# Patient Record
Sex: Male | Born: 1950
Health system: Southern US, Community
[De-identification: ages and names within clinical notes are randomized; demographics above are authoritative.]

## PROBLEM LIST (undated history)

## (undated) DIAGNOSIS — E785 Hyperlipidemia, unspecified: Secondary | ICD-10-CM

## (undated) DIAGNOSIS — I10 Essential (primary) hypertension: Secondary | ICD-10-CM

## (undated) HISTORY — DX: Hyperlipidemia, unspecified: E78.5

## (undated) HISTORY — PX: EYE SURGERY: SHX253

---

## 2011-05-30 DIAGNOSIS — H2702 Aphakia, left eye: Secondary | ICD-10-CM | POA: Insufficient documentation

## 2011-05-30 DIAGNOSIS — S058X9A Other injuries of unspecified eye and orbit, initial encounter: Secondary | ICD-10-CM | POA: Insufficient documentation

## 2011-07-03 ENCOUNTER — Emergency Department (HOSPITAL_COMMUNITY): Payer: Self-pay

## 2011-07-03 ENCOUNTER — Encounter: Payer: Self-pay | Admitting: Emergency Medicine

## 2011-07-03 ENCOUNTER — Emergency Department (HOSPITAL_COMMUNITY)
Admission: EM | Admit: 2011-07-03 | Discharge: 2011-07-04 | Disposition: A | Discharge status: Home / Self Care | Payer: Self-pay | Attending: Emergency Medicine | Admitting: Emergency Medicine

## 2011-07-03 DIAGNOSIS — IMO0002 Reserved for concepts with insufficient information to code with codable children: Secondary | ICD-10-CM | POA: Insufficient documentation

## 2011-07-03 DIAGNOSIS — M545 Low back pain, unspecified: Secondary | ICD-10-CM | POA: Insufficient documentation

## 2011-07-03 DIAGNOSIS — W19XXXA Unspecified fall, initial encounter: Secondary | ICD-10-CM

## 2011-07-03 DIAGNOSIS — M25559 Pain in unspecified hip: Secondary | ICD-10-CM | POA: Insufficient documentation

## 2011-07-03 DIAGNOSIS — S0100XA Unspecified open wound of scalp, initial encounter: Secondary | ICD-10-CM | POA: Insufficient documentation

## 2011-07-03 DIAGNOSIS — S22080A Wedge compression fracture of T11-T12 vertebra, initial encounter for closed fracture: Secondary | ICD-10-CM

## 2011-07-03 DIAGNOSIS — W11XXXA Fall on and from ladder, initial encounter: Secondary | ICD-10-CM | POA: Insufficient documentation

## 2011-07-03 DIAGNOSIS — S0101XA Laceration without foreign body of scalp, initial encounter: Secondary | ICD-10-CM

## 2011-07-03 IMAGING — CR DG LUMBAR SPINE COMPLETE 4+V
5 series · 5 of 5 positions shown · non-contrast
Comparison: Chest x-ray of the same day.

CLINICAL DATA: Fall.  Low back pain.  Bilateral hip pain.

LUMBAR SPINE - COMPLETE 4+ VIEW

[t l-spine a.p.]
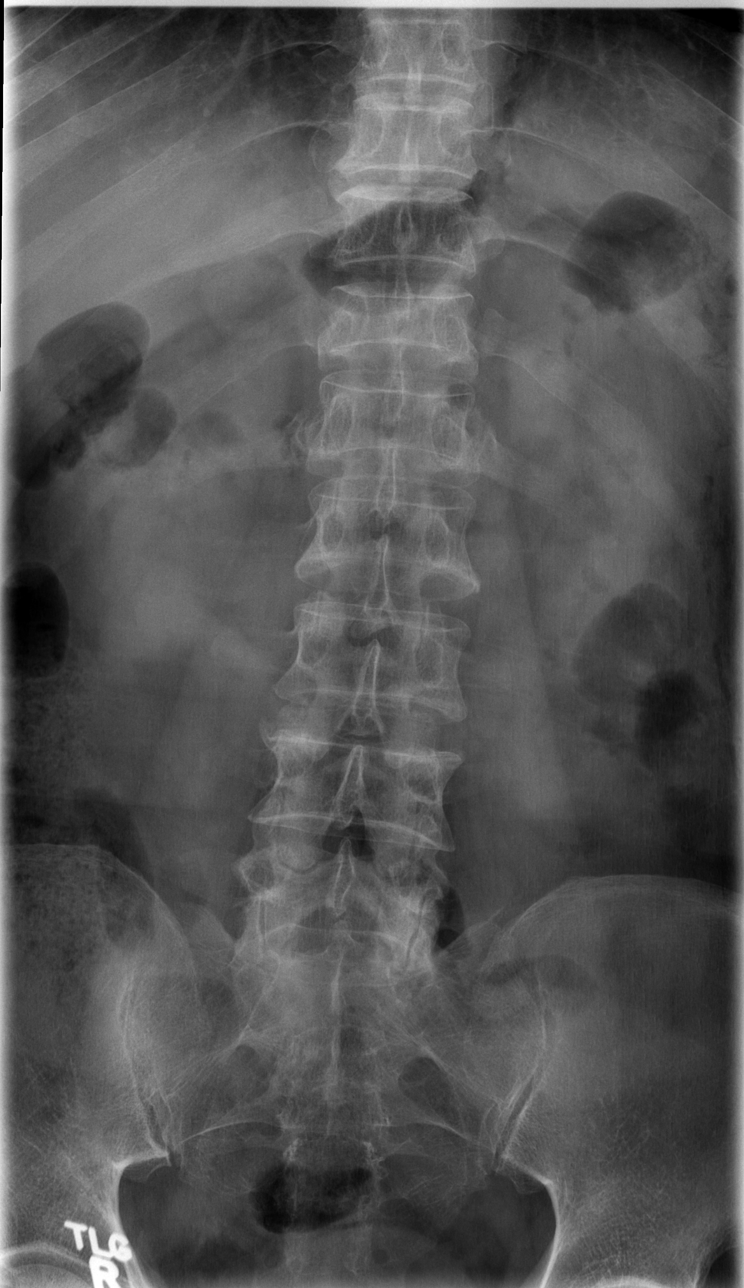

[t l-spine oblique exposure (1 of 2)]
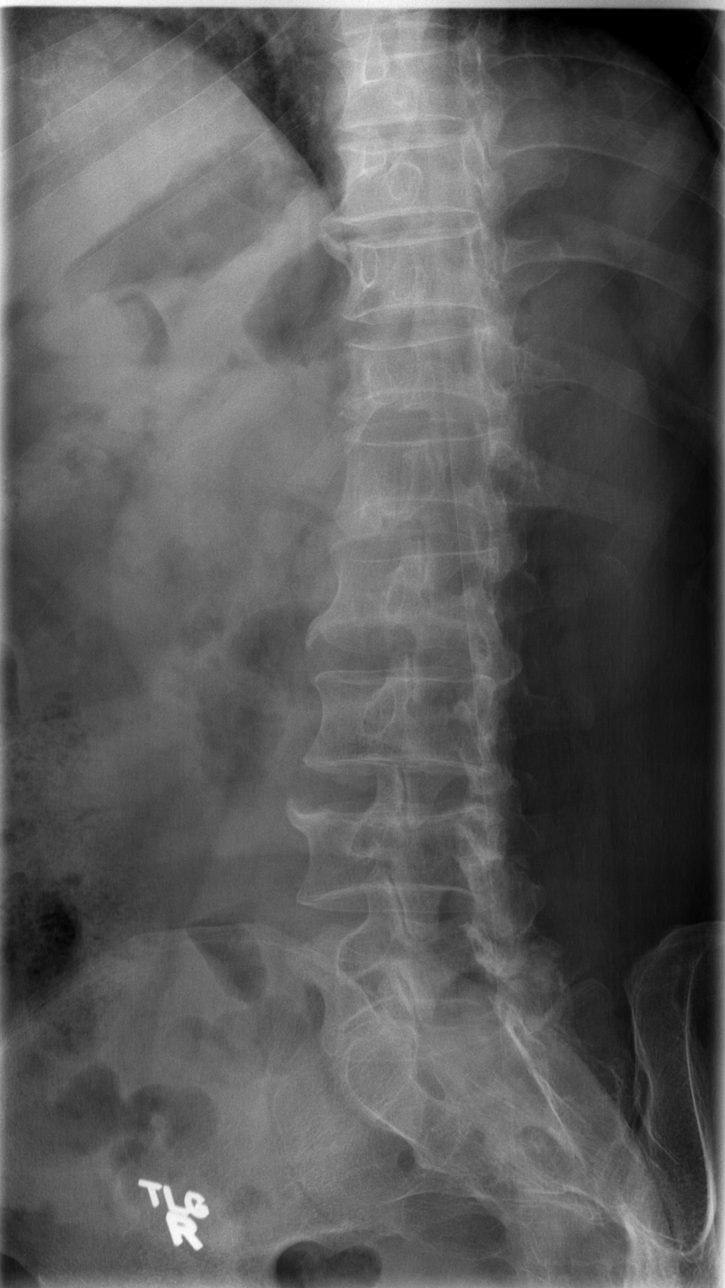

[t l-spine oblique exposure (2 of 2)]
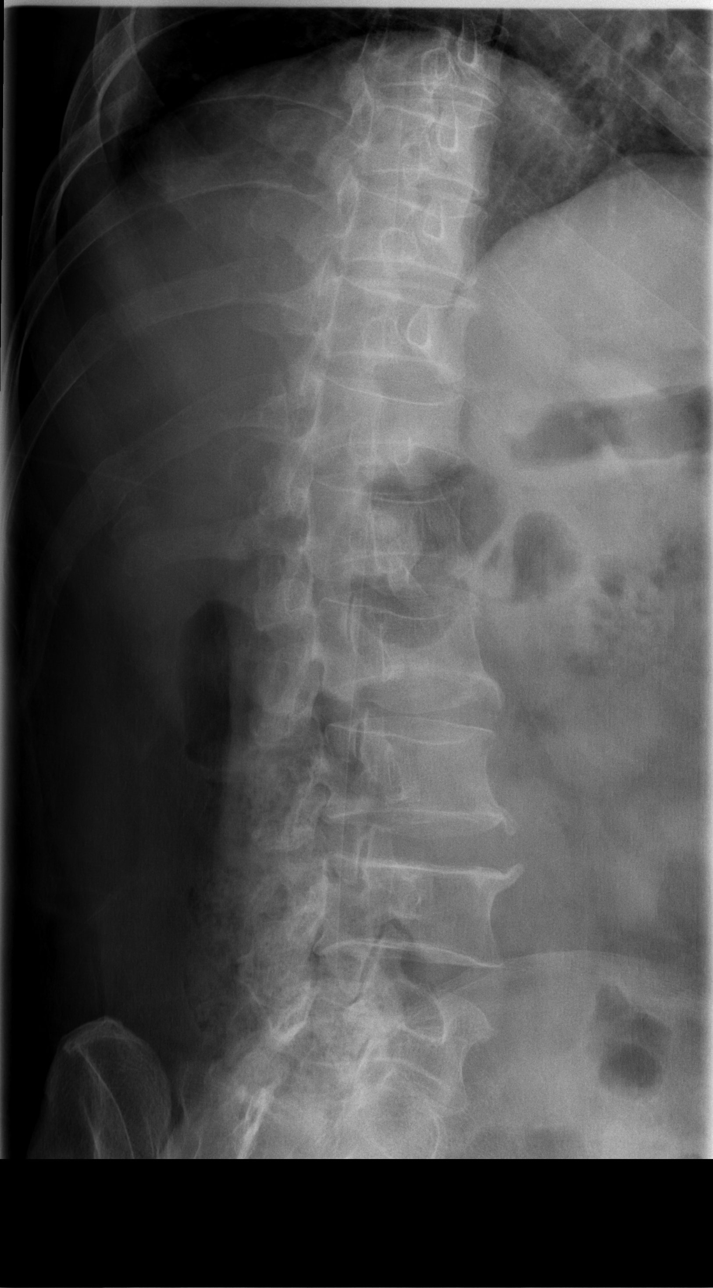

[t l-spine lat]
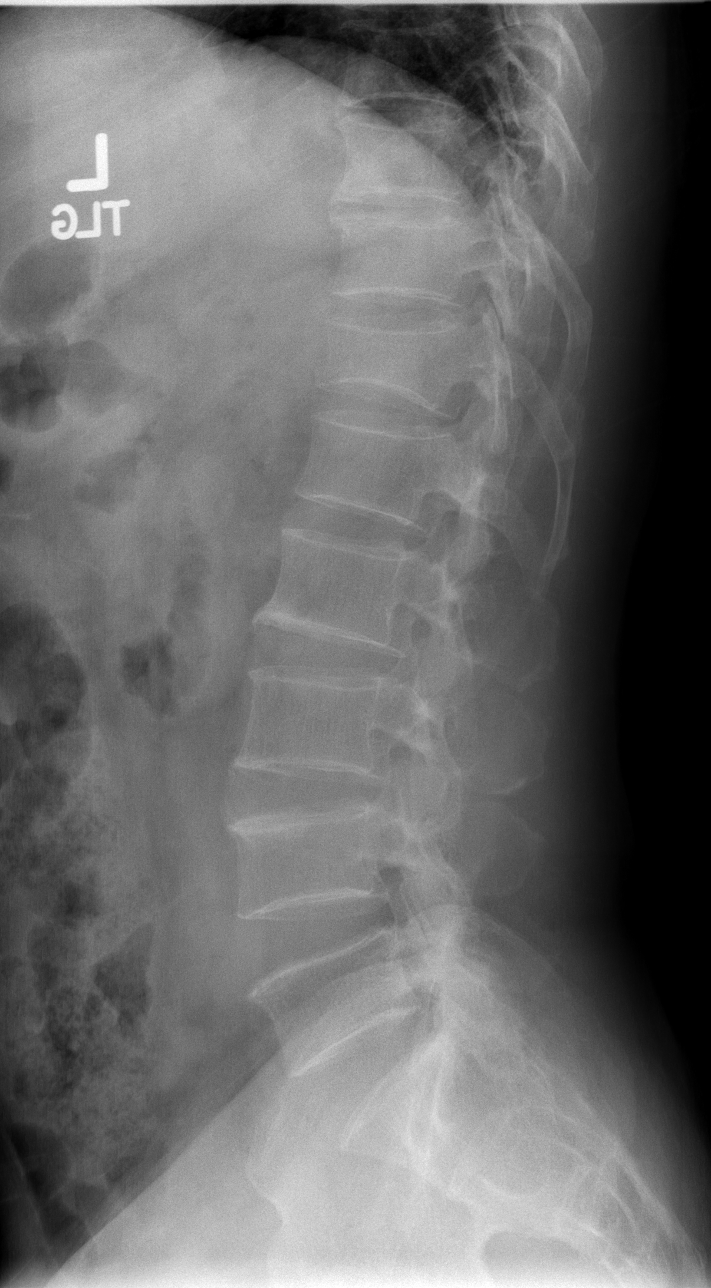

[t l-spine l5-s1 spot]
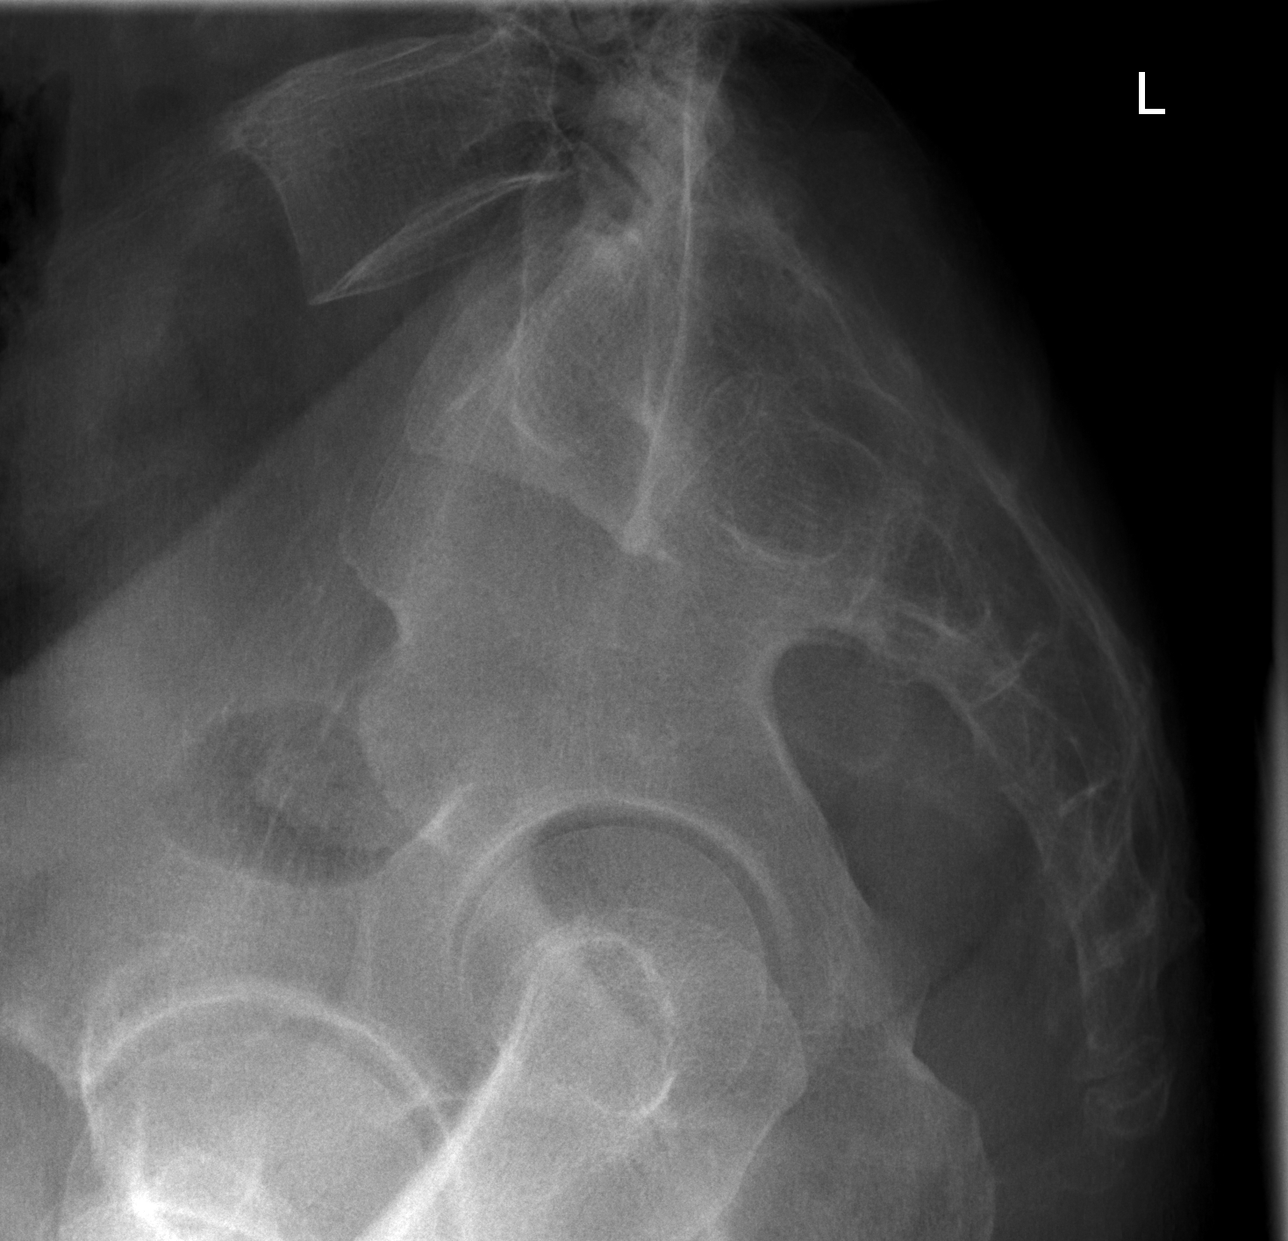

[5 of 5 positions shown; findings below may reference images not displayed]

FINDINGS: A transitional L1 segment is present.

A compression fracture of T12 appears chronic.  An acute fracture
is not excluded.  If the the patient is tender in this area, CT may
be warranted.  The vertebral body heights are otherwise maintained.
Alignment is anatomic.  Mild endplate degenerative changes are
present.  There is mild facet disease, worse on the left at L5-S1.
IMPRESSION: 1.  Transitional L1 segment.
2.  Anterior compression fracture of T12 is likely remote.  An
acute fracture cannot be excluded.  If the patient is tender in
this area, CT may be useful for further evaluation.

## 2011-07-03 IMAGING — CT CT CERVICAL SPINE W/O CM
4 of 5 series · 16 of 33 positions shown, 18 images · non-contrast
Comparison: None

CT HEAD

CLINICAL DATA: Status post fall.  Concern for head or cervical
spine injury.

CT HEAD WITHOUT CONTRAST AND CT CERVICAL SPINE WITHOUT CONTRAST
TECHNIQUE: Multidetector CT imaging of the head and cervical spine
was performed following the standard protocol without intravenous
contrast.  Multiplanar CT image reconstructions of the cervical
spine were also generated.

[Series 6: c_spine 2.0 b31s detail · axial · 0.29mm/px · z∈[+1022,+1118]mm · 4 of 82 slices shown, 5 images]
[im 17/82  soft-tissue]
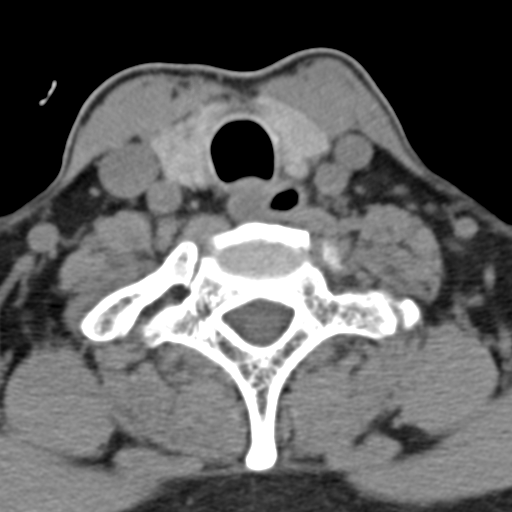
[im 17/82  bone]
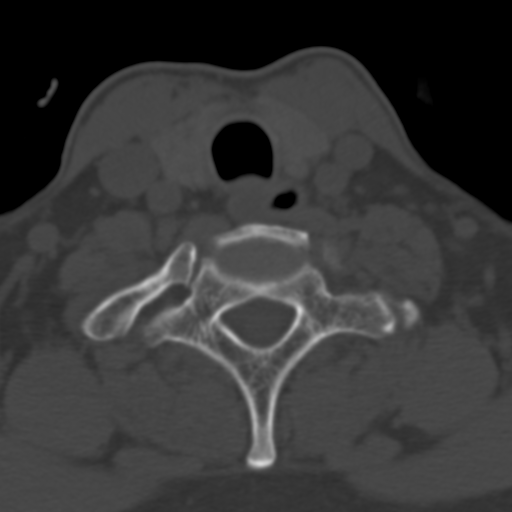
[im 33/82  bone]
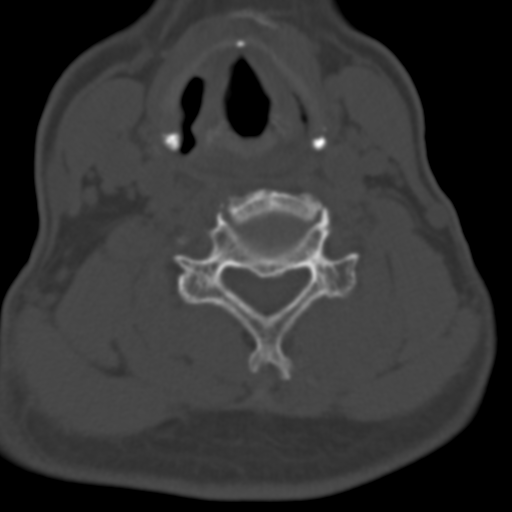
[im 49/82  bone]
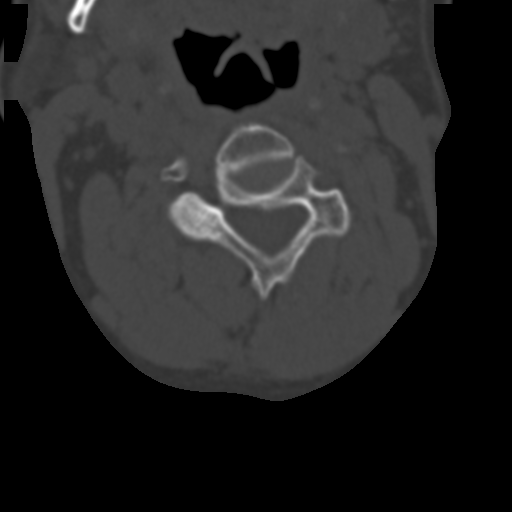
[im 65/82  bone]
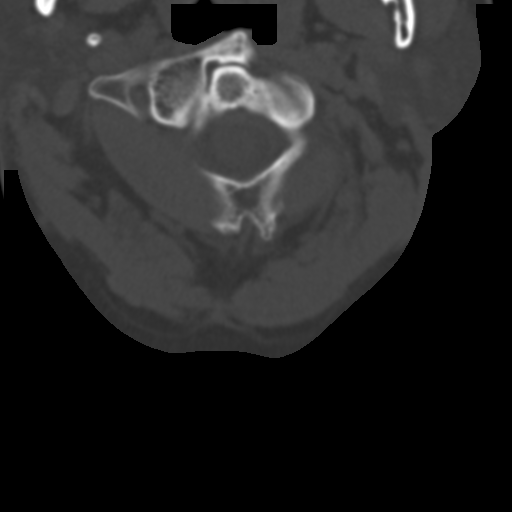

[Series 602: sagittal · sagittal · 0.32mm/px · 5 of 47 slices shown, 6 images]
[im 16/47  bone]
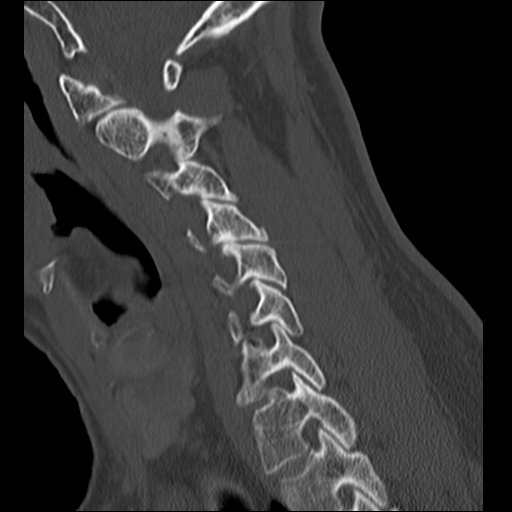
[im 20/47  bone]
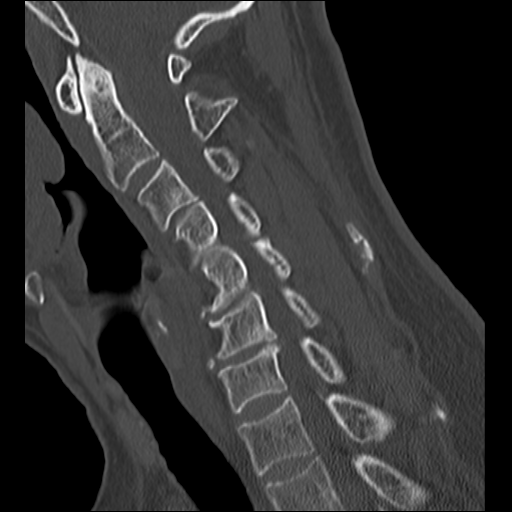
[im 24/47  soft-tissue]
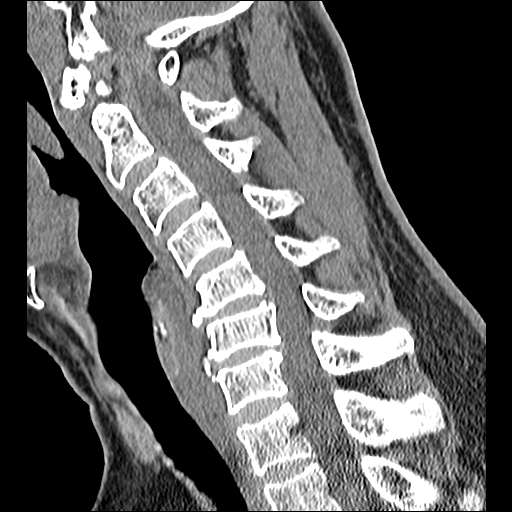
[im 24/47  bone]
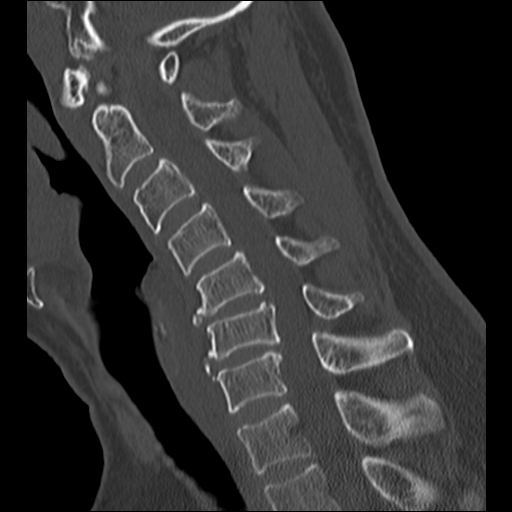
[im 27/47  bone]
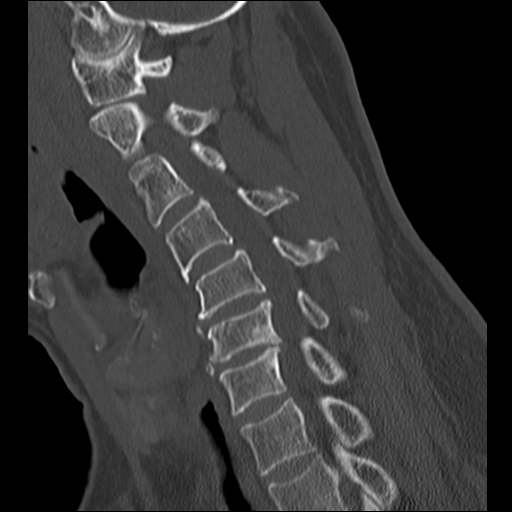
[im 31/47  bone]
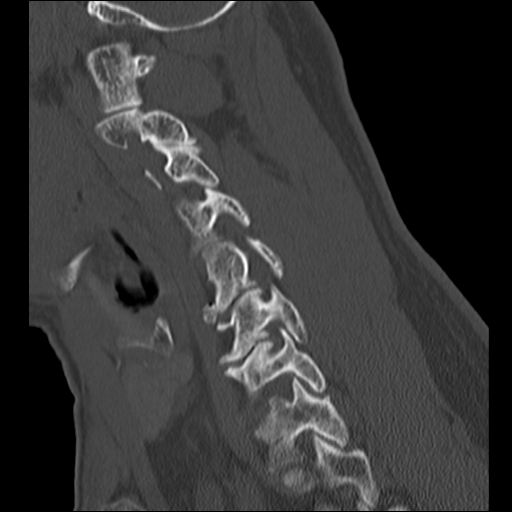

[Series 603: coronals · coronal · 0.32mm/px · 3 of 48 slices shown]
[im 10/48  bone]
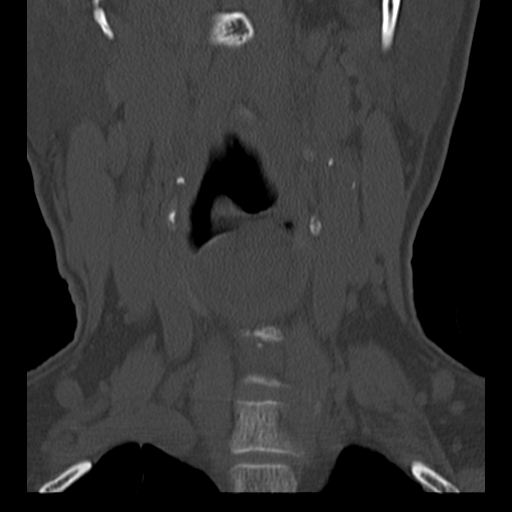
[im 19/48  bone]
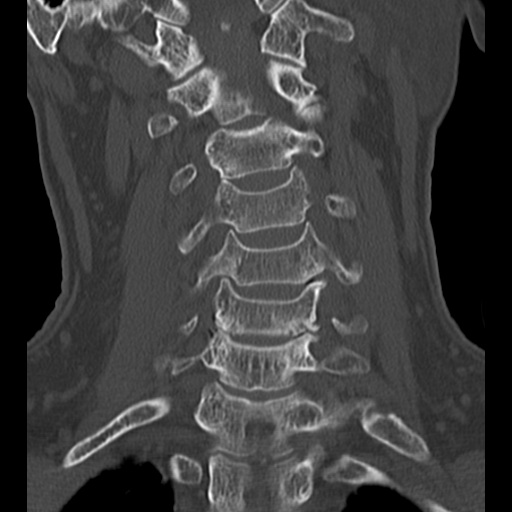
[im 29/48  bone]
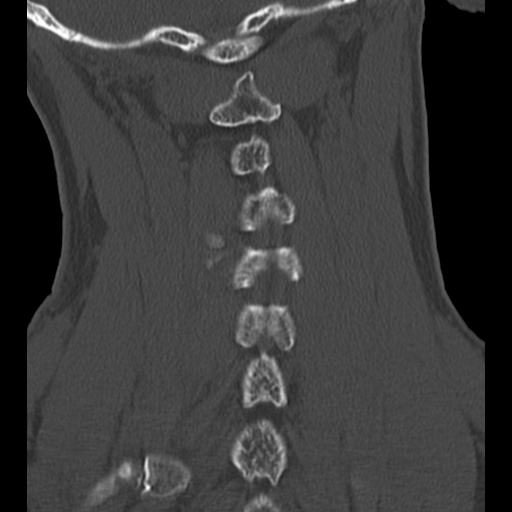

[Series 604: orthgonals · axial · 0.32mm/px · z∈[+992,+1073]mm · 4 of 80 slices shown]
[im 16/80  bone]
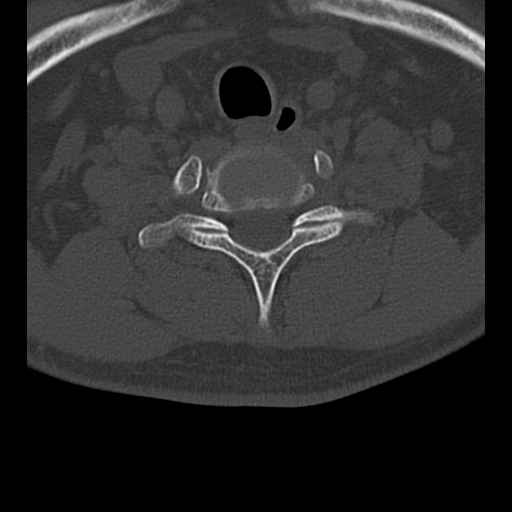
[im 32/80  bone]
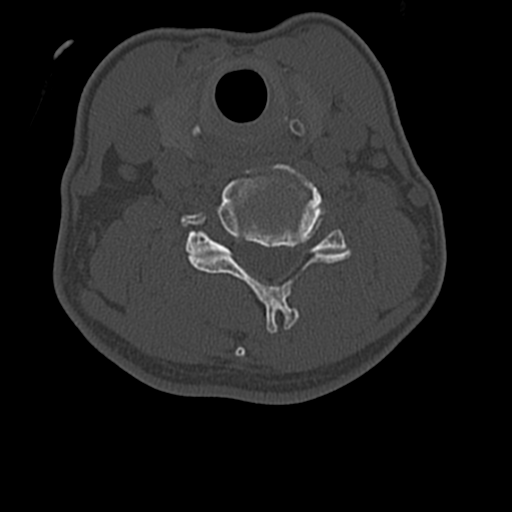
[im 48/80  bone]
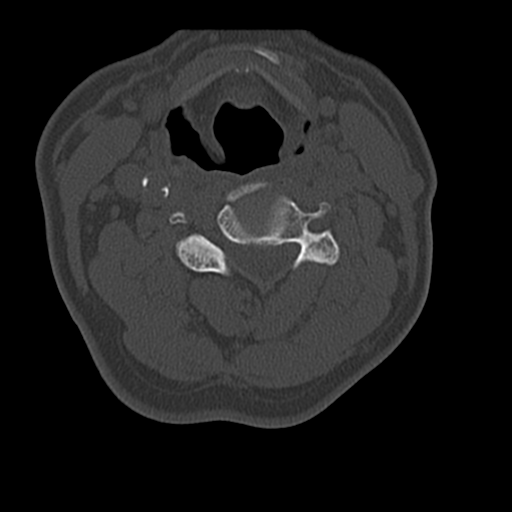
[im 64/80  bone]
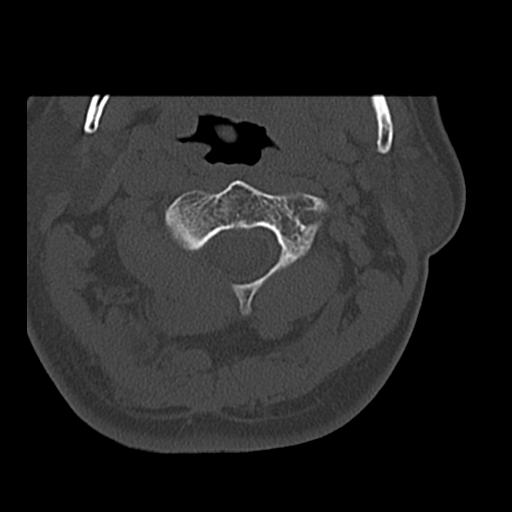

[16 of 33 positions shown; findings below may reference images not displayed]

FINDINGS: There is no evidence of acute infarction, mass lesion, or
intra- or extra-axial hemorrhage on CT.

The posterior fossa, including the cerebellum, brainstem and fourth
ventricle, is within normal limits.  The third and lateral
ventricles, and basal ganglia are unremarkable in appearance.  The
cerebral hemispheres are symmetric in appearance, with normal gray-
white differentiation.  No mass effect or midline shift is seen.

There is no evidence of fracture; visualized osseous structures are
unremarkable in appearance.  The visualized portions of the orbits
are within normal limits. Mild mucosal thickening is noted within
the maxillary sinuses; the remaining paranasal sinuses and mastoid
air cells are well-aerated.  Soft tissue laceration, with soft
tissue swelling and mild soft tissue air, is seen overlying the
left frontoparietal calvarium.
IMPRESSION: 1.  No evidence of traumatic intracranial injury or fracture.
2.  Soft tissue laceration, with soft tissue swelling and mild soft
tissue air, overlying the left frontoparietal calvarium.
3.  Mild mucosal thickening within the maxillary sinuses.

CT CERVICAL SPINE
FINDINGS: There is no evidence of fracture or subluxation.
Reversal of the normal lordotic curvature of the cervical spine
appears chronic in nature, with chronic mild loss of height along
the vertebral bodies at the lower cervical spine.  Associated
anterior and posterior disc osteophyte complexes are seen.
Intervertebral disc spaces are preserved.  Prevertebral soft
tissues are within normal limits.

The thyroid gland is unremarkable in appearance.  The visualized
lung apices are clear.  No significant soft tissue abnormalities
are seen.
IMPRESSION: 1.  No evidence of fracture or subluxation along the cervical
spine.
2.  Mild chronic appearing cervical kyphosis, with chronic mild
loss of vertebral body height along the lower cervical spine.

## 2011-07-03 IMAGING — CR DG HIP (WITH OR WITHOUT PELVIS) 2-3V*L*
3 series · 3 of 3 positions shown · non-contrast
Comparison: None available.

CLINICAL DATA: Fall.  Bilateral hip pain.

LEFT HIP - COMPLETE 2+ VIEW

[t pelvis a.p.]
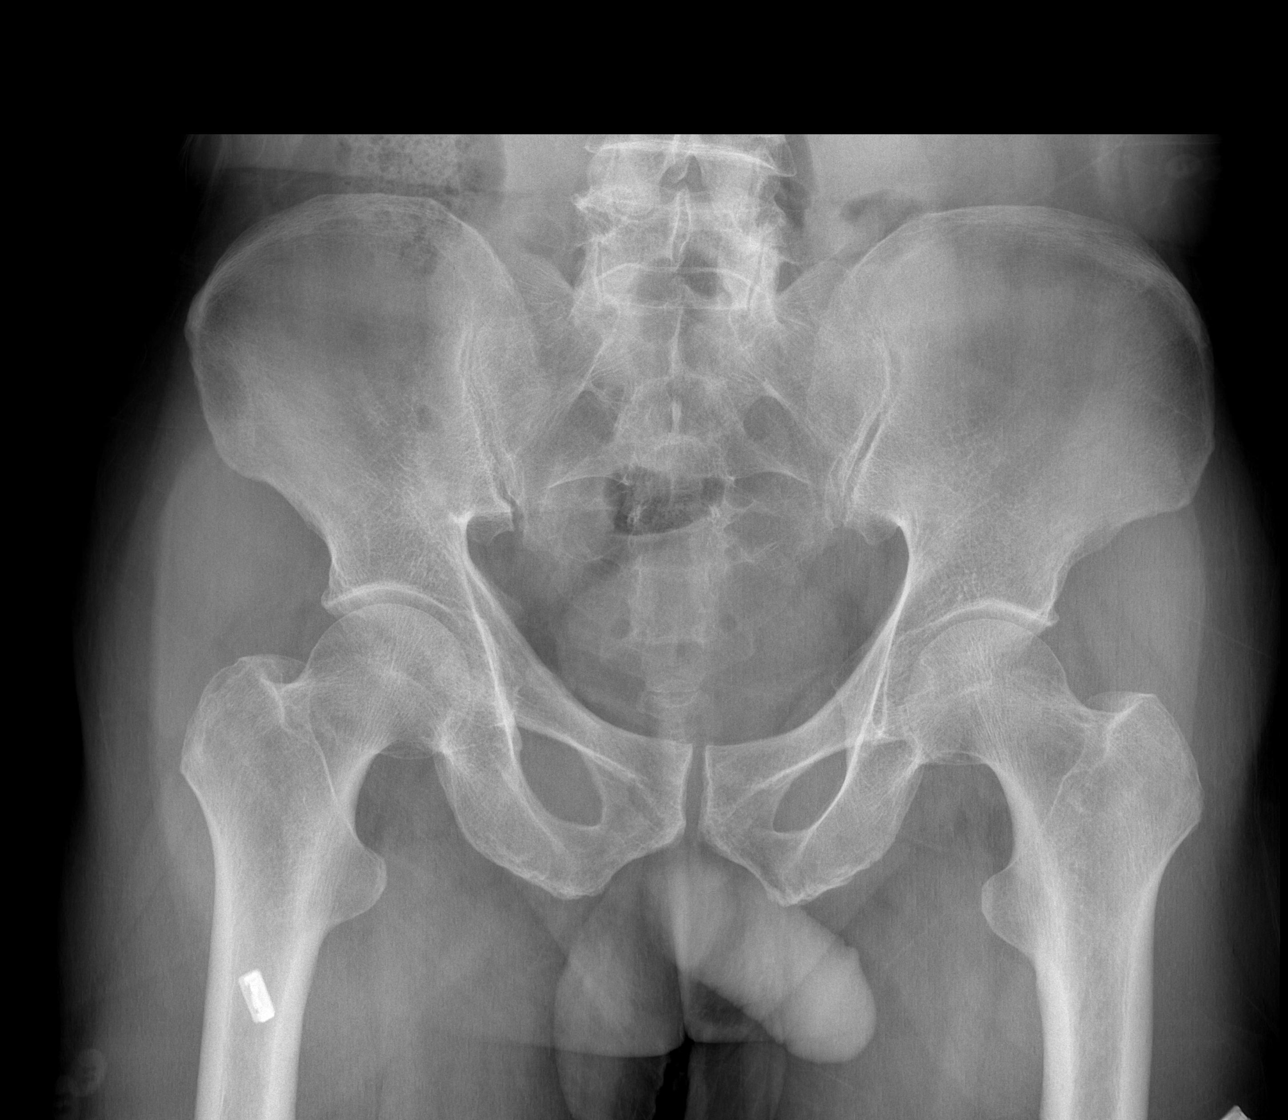

[t hip ap left]
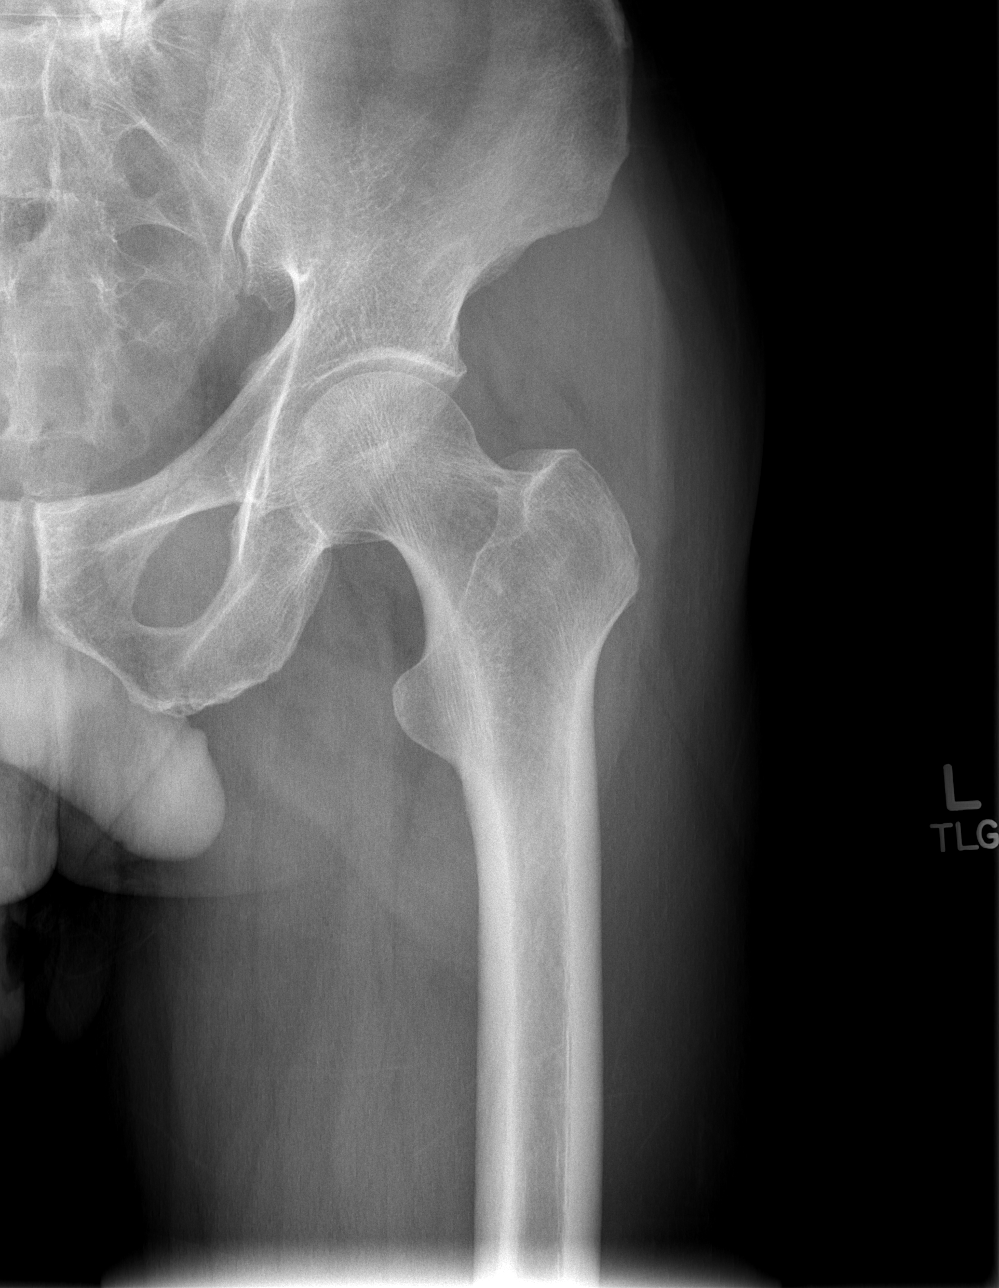

[t hip frog leg left]
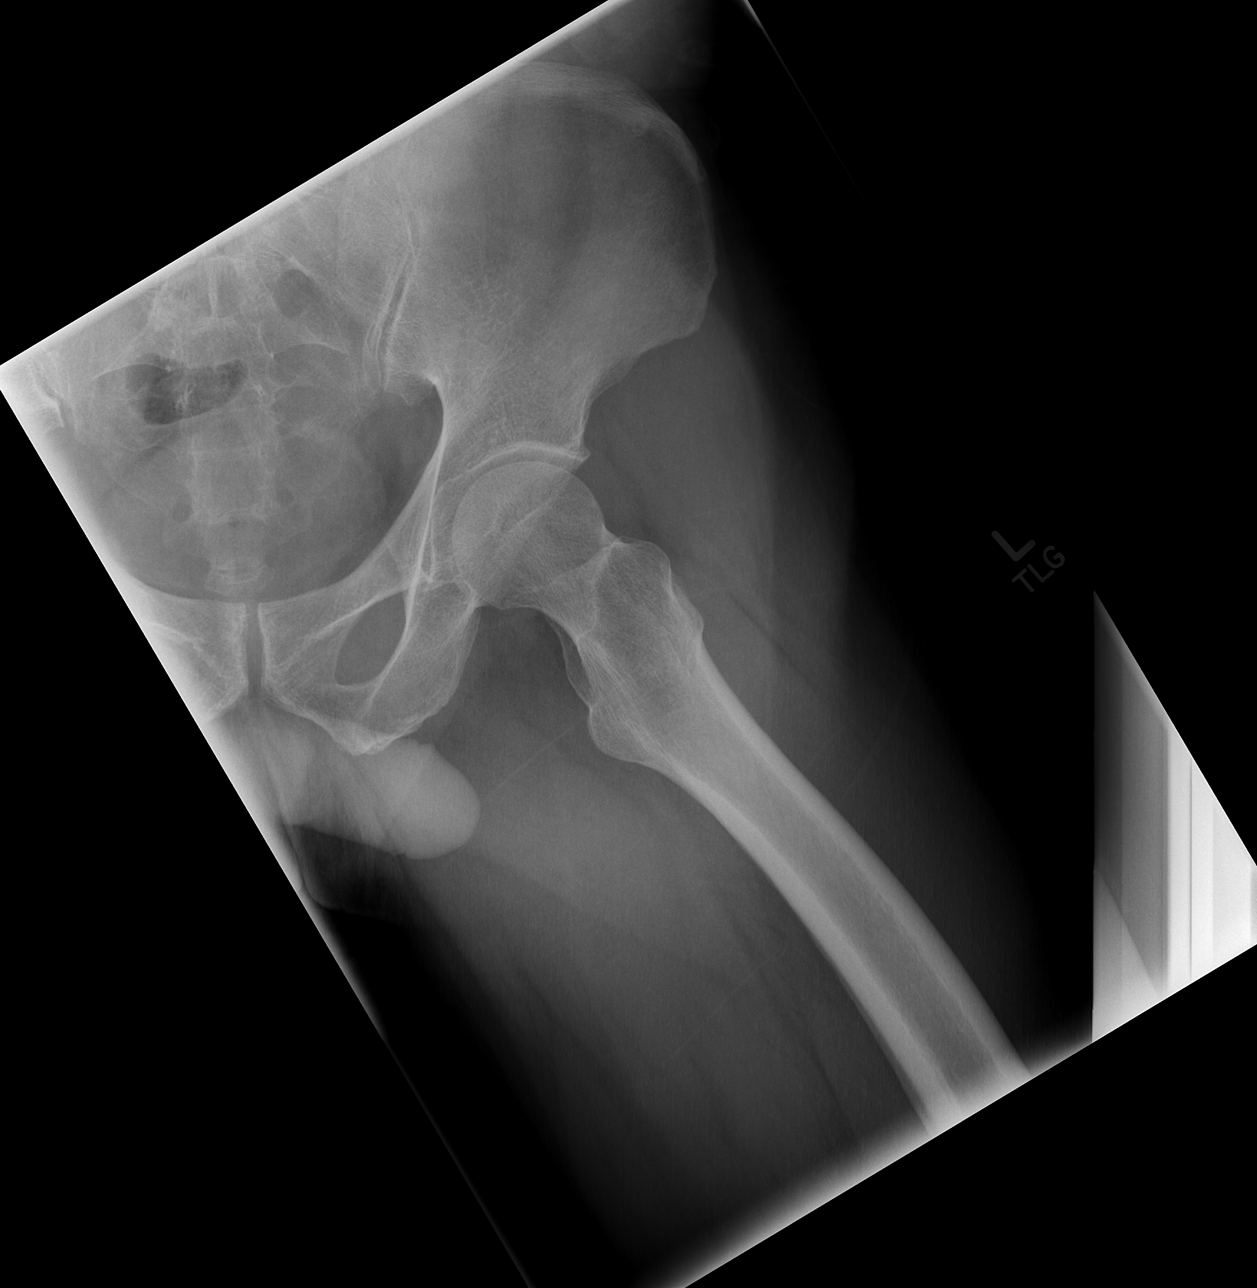

[3 of 3 positions shown; findings below may reference images not displayed]

FINDINGS: The left hip is located.  No acute bone or soft tissue
abnormality is present.  The pelvis is intact.  Mild degenerative
changes are evident at L5-S1.
IMPRESSION: 1.  Negative left hip.
2.  Minimal degenerative changes at L5-S1.

## 2011-07-03 IMAGING — CR DG CHEST 2V
2 series · 2 of 2 positions shown · non-contrast
Comparison: None.

CLINICAL DATA: Fever and cough.

CHEST - 2 VIEW

[t chest supine]
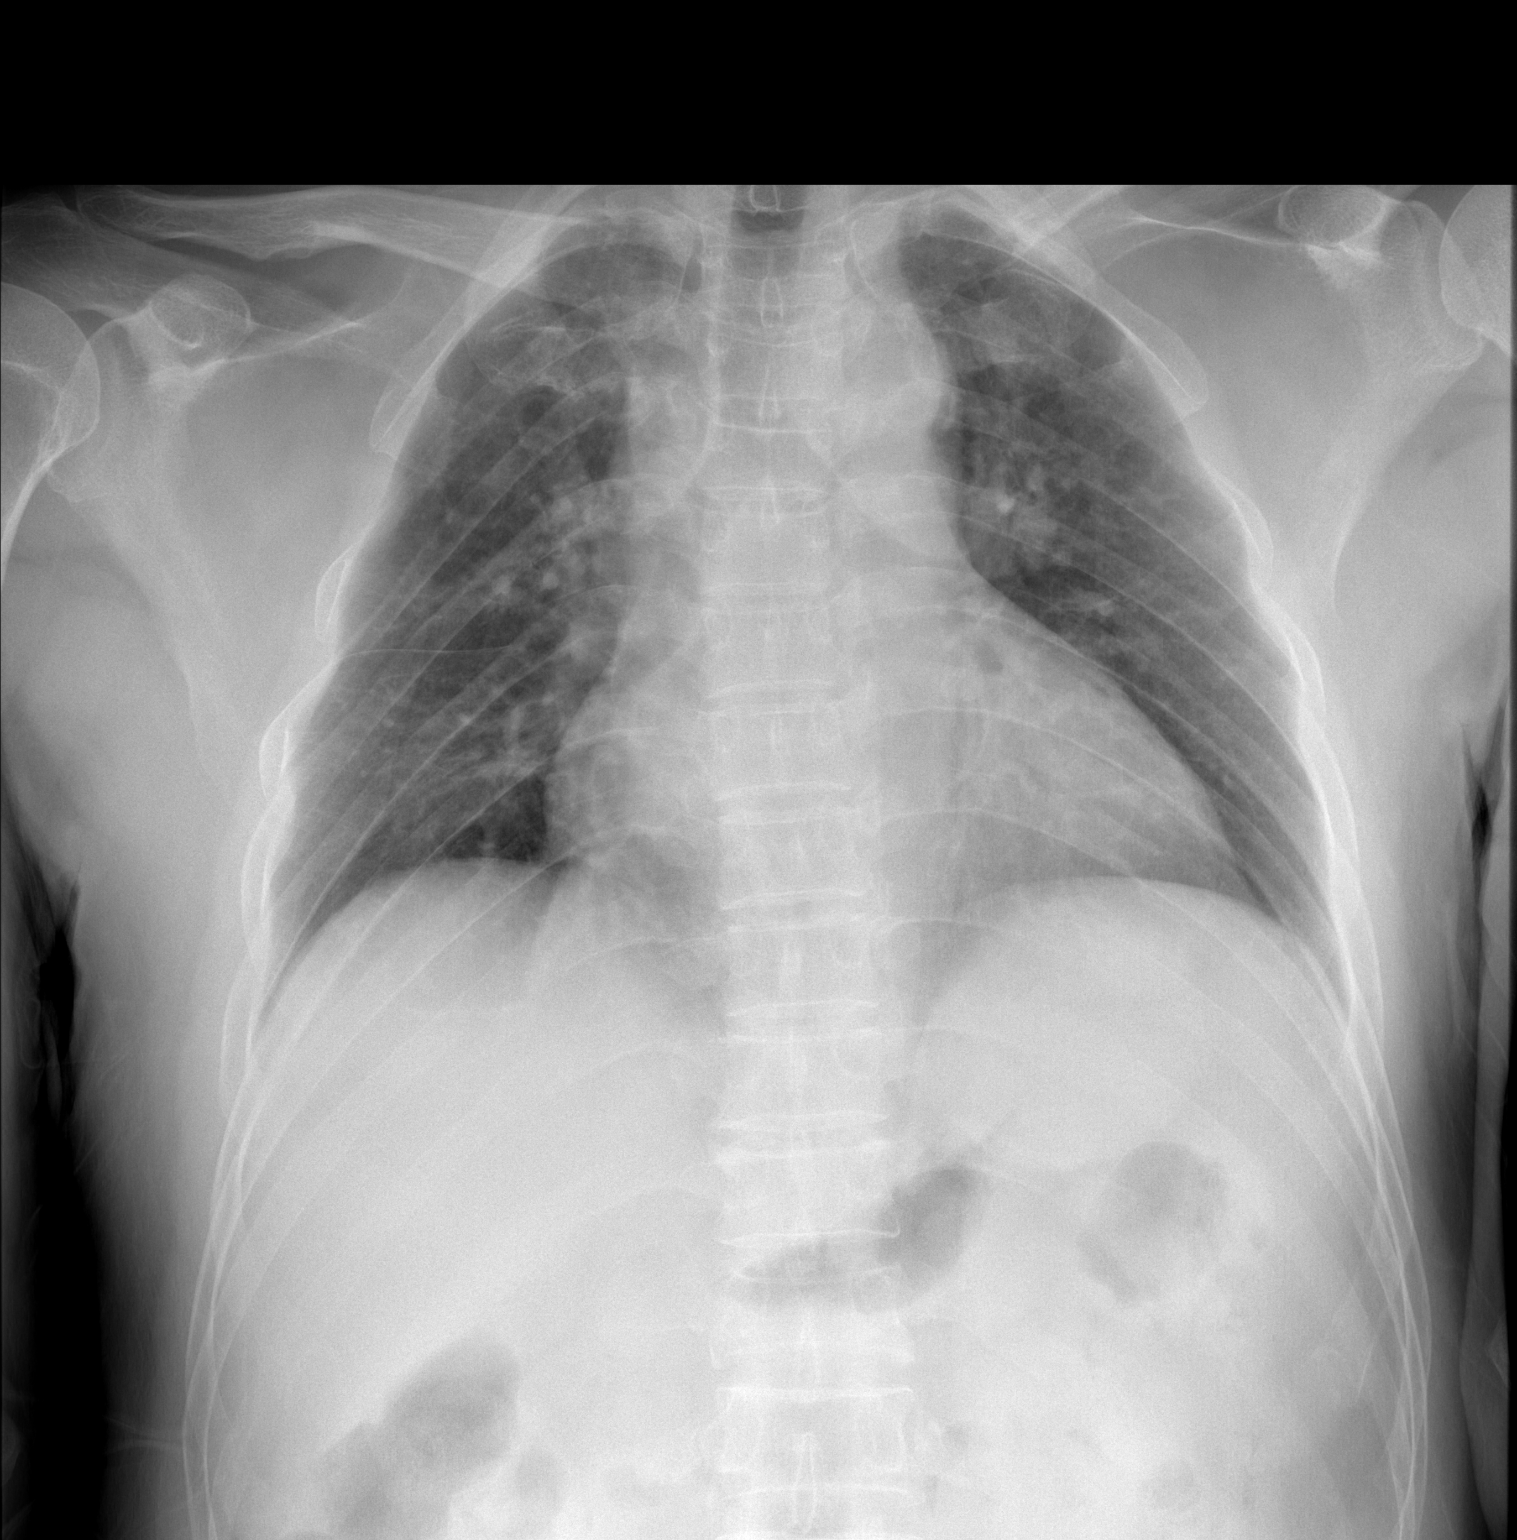

[w chest lat]
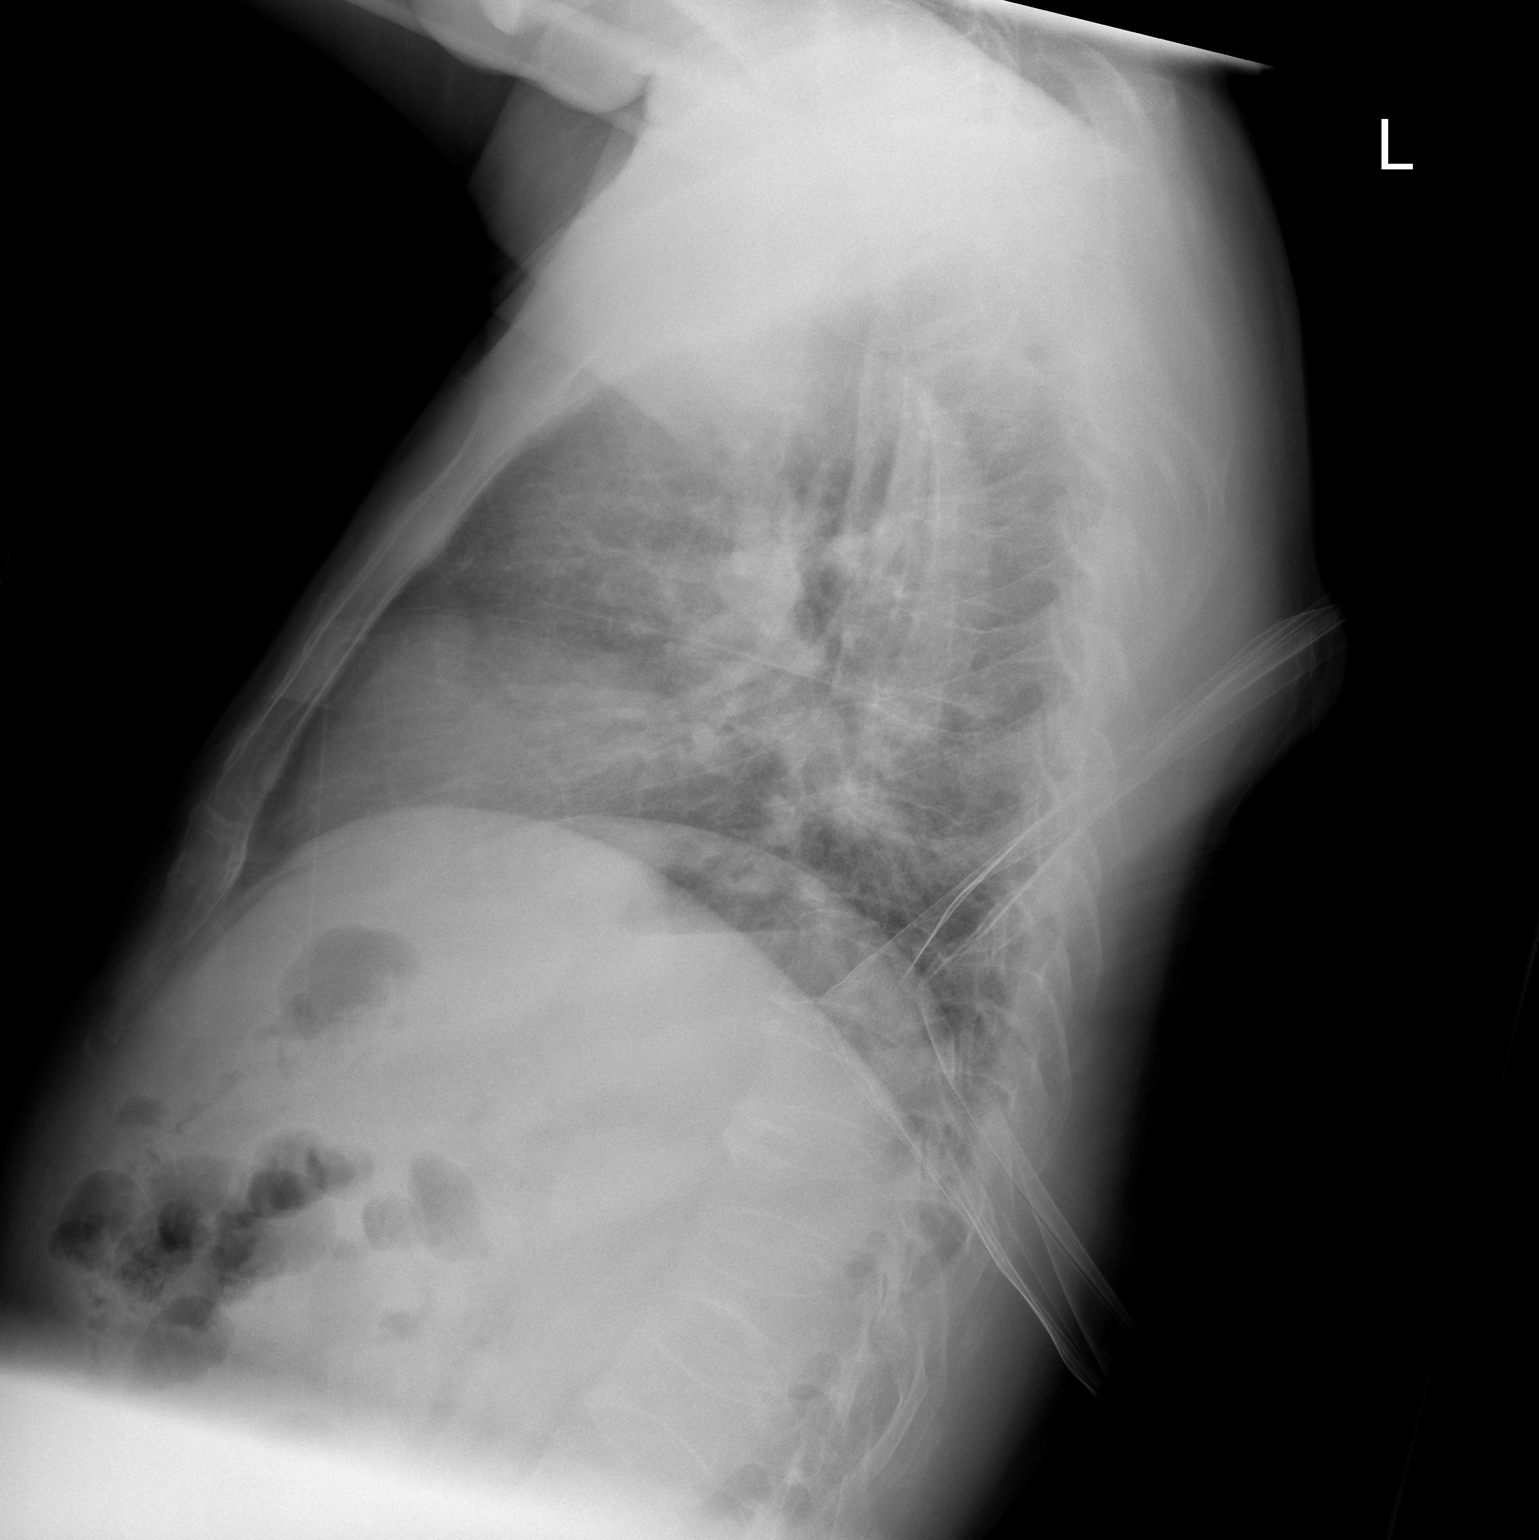

[2 of 2 positions shown; findings below may reference images not displayed]

FINDINGS: Mild cardiomegaly is present. Mild pulmonary vascular
congestion is noted.  There are no significant effusions.  The lung
volumes are low.  No focal airspace disease is present.
IMPRESSION: 1.  Cardiomegaly and mild pulmonary vascular congestion.  This
could represent early heart failure.
2.  Low lung volumes.

## 2011-07-03 MED ORDER — ACETAMINOPHEN 325 MG PO TABS: 650.0000 mg | ORAL_TABLET | Freq: Once | ORAL | Status: DC

## 2011-07-03 MED ORDER — TETANUS-DIPHTH-ACELL PERTUSSIS 5-2.5-18.5 LF-MCG/0.5 IM SUSP
0.5000 mL | Freq: Once | INTRAMUSCULAR | Status: AC
  Administered 2011-07-03: 0.5 mL via INTRAMUSCULAR
  Filled 2011-07-03: qty 0.5

## 2011-07-03 MED ORDER — OXYCODONE-ACETAMINOPHEN 5-325 MG PO TABS
2.0000 | ORAL_TABLET | Freq: Once | ORAL | Status: AC
  Administered 2011-07-03: 2 via ORAL
  Filled 2011-07-03: qty 2

## 2011-07-03 NOTE — ED Provider Notes (Signed)
History     CSN: [id number] Arrival date & time: 07/03/2011  4:40 PM   First MD Initiated Contact with Patient 07/03/11 2005      Chief Complaint  Patient presents with  . Fall   HPI  History provided by the patient and family. Patient with very limited English. Patient's family reports that patient was outside doing yard work while they were with him. Patient was up on a ladder and lost his balance and fell to the ground. Patient thinks he was about 5 feet off the ground. Patient landed on left side and also hit the side of his head and arm against the latter. Patient denies any loss of consciousness. Patient has been ambulatory. Patient has a small laceration to his left frontal scalp and abrasions to his right arm. Patient complains of pain in left hip and low back area. Pain is worse with walking and some positions. Patient has not taken any medicines for the pain. Patient denies chest pain, shortness of breath, abdominal pain. Patient has no other significant past medical history.   History reviewed. No pertinent past medical history.  Past Surgical History  Procedure Date  . Eye surgery     No family history on file.  History  Substance Use Topics  . Smoking status: Never Smoker   . Smokeless tobacco: Not on file  . Alcohol Use: No      Review of Systems  Neurological: Negative for dizziness, speech difficulty, weakness, numbness and headaches.  Psychiatric/Behavioral: Negative for confusion.  All other systems reviewed and are negative.    Allergies  Review of patient's allergies indicates no known allergies.  Home Medications  No current outpatient prescriptions on file.  BP 145/74  Pulse 66  Temp(Src) 97.4 F (36.3 C) (Oral)  Resp 16  SpO2 98%  Physical Exam  Nursing note and vitals reviewed. Constitutional: He is oriented to person, place, and time. He appears well-developed and well-nourished. No distress.  HENT:  Head: Normocephalic.       4 cm  superficial laceration and abrasion to left frontal scalp. No battle sign or raccoon eyes.  Eyes: EOM are normal.       Left pupil and iris appear changed secondary to history of prior surgeries. 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  Neck: Normal range of motion. Neck supple.  Cardiovascular: Normal rate, regular rhythm and normal heart sounds.   Pulmonary/Chest: Effort normal and breath sounds normal. He has no wheezes. He has no rales. He exhibits no tenderness.  Abdominal: Soft. Bowel sounds are normal. He exhibits no distension. There is no tenderness. There is no rebound and no guarding.  Musculoskeletal: Normal range of motion. He exhibits no tenderness.       Cervical back: Normal. He exhibits no tenderness and no bony tenderness.       Thoracic back: Normal. He exhibits no tenderness and no bony tenderness.       Lumbar back: He exhibits tenderness.       Mild tenderness to palpation over left hip. Patient has full range of motion of left hip. Left lower leg is not shortened or rotated. he has normal sensation in feet with normal pulse. Normal upper extremities with no deformities or pain.  Neurological: He is alert and oriented to person, place, and time. He has normal strength. No cranial nerve deficit or sensory deficit. Gait normal.  Skin: Skin is warm.       Small abrasions to medial right arm near  elbow.  Psychiatric: He has a normal mood and affect. His behavior is normal.    ED Course  Procedures (including critical care time)   LACERATION REPAIR Performed by: Angus Seller Authorized by: Angus Seller Consent: Verbal consent obtained. Risks and benefits: risks, benefits and alternatives were discussed Consent given by: patient Patient identity confirmed: provided demographic data Prepped and Draped in normal sterile fashion Wound explored  Laceration Location: Left frontal scalp  Laceration Length: 47cm  No Foreign Bodies seen or palpated  Anesthesia: local  infiltration  Local anesthetic: lidocaine 2% with epinephrine  Anesthetic total: 2 ml  Irrigation method: syringe Amount of cleaning: standard  Skin closure: Staple   Number of staples: 2  Patient tolerance: Patient tolerated the procedure well with no immediate complications.      Dg Chest 2 View  07/03/2011  *RADIOLOGY REPORT*  Clinical Data: Fever and cough.  CHEST - 2 VIEW  Comparison: None.  Findings: Mild cardiomegaly is present. Mild pulmonary vascular congestion is noted.  There are no significant effusions.  The lung volumes are low.  No focal airspace disease is present.  IMPRESSION:  1.  Cardiomegaly and mild pulmonary vascular congestion.  This could represent early heart failure. 2.  Low lung volumes.  Original Report Authenticated By: Jamesetta Orleans. MATTERN, M.D.   Dg Lumbar Spine Complete  07/03/2011  *RADIOLOGY REPORT*  Clinical Data: Fall.  Low back pain.  Bilateral hip pain.  LUMBAR SPINE - COMPLETE 4+ VIEW  Comparison: Chest x-ray of the same day.  Findings: A transitional L1 segment is present.  A compression fracture of T12 appears chronic.  Tyion acute fracture is not excluded.  If the the patient is tender in this area, CT may be warranted.  The vertebral body heights are otherwise maintained. Alignment is anatomic.  Mild endplate degenerative changes are present.  There is mild facet disease, worse on the left at L5-S1.  IMPRESSION:  1.  Transitional L1 segment. 2.  Anterior compression fracture of T12 is likely remote.  Gemini acute fracture cannot be excluded.  If the patient is tender in this area, CT may be useful for further evaluation.  Original Report Authenticated By: Jamesetta Orleans. MATTERN, M.D.   Dg Hip Complete Left  07/03/2011  *RADIOLOGY REPORT*  Clinical Data: Fall.  Bilateral hip pain.  LEFT HIP - COMPLETE 2+ VIEW  Comparison: None available.  Findings: The left hip is located.  No acute bone or soft tissue abnormality is present.  The pelvis is intact.  Mild  degenerative changes are evident at L5-S1.  IMPRESSION: 1.  Negative left hip. 2.  Minimal degenerative changes at L5-S1.  Original Report Authenticated By: Jamesetta Orleans. MATTERN, M.D.   Ct Head Wo Contrast  07/03/2011  *RADIOLOGY REPORT*  Clinical Data:  Status post fall.  Concern for head or cervical spine injury.  CT HEAD WITHOUT CONTRAST AND CT CERVICAL SPINE WITHOUT CONTRAST  Technique:  Multidetector CT imaging of the head and cervical spine was performed following the standard protocol without intravenous contrast.  Multiplanar CT image reconstructions of the cervical spine were also generated.  Comparison: None  CT HEAD  Findings: There is no evidence of acute infarction, mass lesion, or intra- or extra-axial hemorrhage on CT.  The posterior fossa, including the cerebellum, brainstem and fourth ventricle, is within normal limits.  The third and lateral ventricles, and basal ganglia are unremarkable in appearance.  The cerebral hemispheres are symmetric in appearance, with normal gray- white differentiation.  No  mass effect or midline shift is seen.  There is no evidence of fracture; visualized osseous structures are unremarkable in appearance.  The visualized portions of the orbits are within normal limits. Mild mucosal thickening is noted within the maxillary sinuses; the remaining paranasal sinuses and mastoid air cells are well-aerated.  Soft tissue laceration, with soft tissue swelling and mild soft tissue air, is seen overlying the left frontoparietal calvarium.  IMPRESSION:  1.  No evidence of traumatic intracranial injury or fracture. 2.  Soft tissue laceration, with soft tissue swelling and mild soft tissue air, overlying the left frontoparietal calvarium. 3.  Mild mucosal thickening within the maxillary sinuses.  CT CERVICAL SPINE  Findings: There is no evidence of fracture or subluxation. Reversal of the normal lordotic curvature of the cervical spine appears chronic in nature, with chronic  mild loss of height along the vertebral bodies at the lower cervical spine.  Associated anterior and posterior disc osteophyte complexes are seen. Intervertebral disc spaces are preserved.  Prevertebral soft tissues are within normal limits.  The thyroid gland is unremarkable in appearance.  The visualized lung apices are clear.  No significant soft tissue abnormalities are seen.  IMPRESSION:  1.  No evidence of fracture or subluxation along the cervical spine. 2.  Mild chronic appearing cervical kyphosis, with chronic mild loss of vertebral body height along the lower cervical spine.  Original Report Authenticated By: Tonia Ghent, M.D.   Ct Cervical Spine Wo Contrast  07/03/2011  *RADIOLOGY REPORT*  Clinical Data:  Status post fall.  Concern for head or cervical spine injury.  CT HEAD WITHOUT CONTRAST AND CT CERVICAL SPINE WITHOUT CONTRAST  Technique:  Multidetector CT imaging of the head and cervical spine was performed following the standard protocol without intravenous contrast.  Multiplanar CT image reconstructions of the cervical spine were also generated.  Comparison: None  CT HEAD  Findings: There is no evidence of acute infarction, mass lesion, or intra- or extra-axial hemorrhage on CT.  The posterior fossa, including the cerebellum, brainstem and fourth ventricle, is within normal limits.  The third and lateral ventricles, and basal ganglia are unremarkable in appearance.  The cerebral hemispheres are symmetric in appearance, with normal gray- white differentiation.  No mass effect or midline shift is seen.  There is no evidence of fracture; visualized osseous structures are unremarkable in appearance.  The visualized portions of the orbits are within normal limits. Mild mucosal thickening is noted within the maxillary sinuses; the remaining paranasal sinuses and mastoid air cells are well-aerated.  Soft tissue laceration, with soft tissue swelling and mild soft tissue air, is seen overlying the left  frontoparietal calvarium.  IMPRESSION:  1.  No evidence of traumatic intracranial injury or fracture. 2.  Soft tissue laceration, with soft tissue swelling and mild soft tissue air, overlying the left frontoparietal calvarium. 3.  Mild mucosal thickening within the maxillary sinuses.  CT CERVICAL SPINE  Findings: There is no evidence of fracture or subluxation. Reversal of the normal lordotic curvature of the cervical spine appears chronic in nature, with chronic mild loss of height along the vertebral bodies at the lower cervical spine.  Associated anterior and posterior disc osteophyte complexes are seen. Intervertebral disc spaces are preserved.  Prevertebral soft tissues are within normal limits.  The thyroid gland is unremarkable in appearance.  The visualized lung apices are clear.  No significant soft tissue abnormalities are seen.  IMPRESSION:  1.  No evidence of fracture or subluxation along the cervical spine.  2.  Mild chronic appearing cervical kyphosis, with chronic mild loss of vertebral body height along the lower cervical spine.  Original Report Authenticated By: Tonia Ghent, M.D.   Ct Lumbar Spine Wo Contrast  07/04/2011  *RADIOLOGY REPORT*  Clinical Data: Status post fall, with lower back pain; CT of the lumbar spine suggested for further evaluation, due to nonspecific finding concerning for fracture on radiograph.  CT LUMBAR SPINE WITHOUT CONTRAST  Technique:  Multidetector CT imaging of the lumbar spine was performed without intravenous contrast administration. Multiplanar CT image reconstructions were also generated.  Comparison: Lumbar spine radiographs performed earlier today at 09:08 p.m.  Findings: There is Jylan acute relatively poorly characterized compression fracture involving vertebral body T12, with a mildly displaced fragment arising at the anterior inferior endplate. Fracture lines extend across the vertebral body, but are not well seen; there is resultant mild anterior wedging and  approximately 20% loss of height.  Adjacent intervertebral disc spaces are preserved.  There is no evidence of extension to the posterior elements.  No retropulsion is seen.  No additional fractures are identified.  No significant surrounding soft tissue abnormalities are characterized.  Vertebral body numbering assumes five lumbar vertebral bodies, with transitional L1 anatomy.  The visualized soft tissues are grossly unremarkable in appearance. The paraspinal musculature is within normal limits.  IMPRESSION: Acute compression fracture involving vertebral body T12, with a mildly displaced fracture fragment arising from the anterior inferior endplate.  Approximately 20% loss of height noted, with mild anterior wedging.  No evidence of extension to the posterior elements; no retropulsion seen.  Adjacent intervertebral disc spaces are preserved.  Vertebral body numbering assumes five lumbar vertebral bodies, with transitional L1 anatomy.  Original Report Authenticated By: Tonia Ghent, M.D.     1. Fall   2. Laceration of scalp   3. Compression fracture of T12 vertebra       MDM  8:00 PM patient seen and evaluated. Patient in no acute distress.  9:45 PM patient lumbar x-rays show concerns for possible remote or acute compression fracture. Clinical presentation is consistent with possible acute compression fracture. At this time patient reports pain is significantly improved after Percocet. He has good movement and normal gait. He does have point tenderness over low back.  Patient discussed with attending physician. Will order CT scan for further evaluation of possible acute lumbar compression fracture.       Angus Seller, [place] 07/04/11 [phone]

## 2011-07-03 NOTE — ED Notes (Signed)
Pt's family member st's pt was working in yard, tripped and fell.  Pt has lac to left side of scalp also c/o lower back pain.  Denies LOC

## 2011-07-03 NOTE — ED Provider Notes (Signed)
BP 145/74  Pulse 66  Temp(Src) 97.4 F (36.3 C) (Oral)  Resp 16  SpO2 98% Pt seen with PA No distress noted but still with back pain Awaiting imaging at this time  Joya Gaskins, MD 07/03/11 [phone]

## 2011-07-03 NOTE — ED Notes (Signed)
Patient transported to CT 

## 2011-07-04 ENCOUNTER — Encounter (HOSPITAL_COMMUNITY): Payer: Self-pay | Admitting: Radiology

## 2011-07-04 IMAGING — CT CT L SPINE W/O CM
1 series · 12 of 14 positions shown, 15 images · non-contrast
Comparison: Lumbar spine radiographs performed earlier today at

CLINICAL DATA: Status post fall, with lower back pain; CT of the
lumbar spine suggested for further evaluation, due to nonspecific
finding concerning for fracture on radiograph.

CT LUMBAR SPINE WITHOUT CONTRAST
TECHNIQUE: Multidetector CT imaging of the lumbar spine was
performed without intravenous contrast administration. Multiplanar
CT image reconstructions were also generated.

[Series 4: 2mm axial soft tissue · axial · 0.29mm/px · z∈[-444,-294]mm · 12 of 89 slices shown, 15 images]
[im 7/89  soft-tissue]
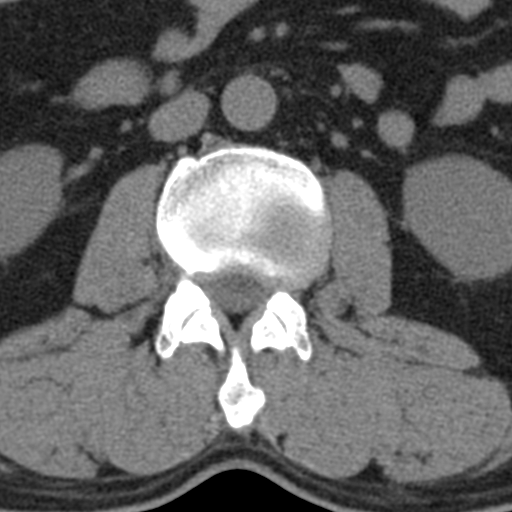
[im 7/89  bone]
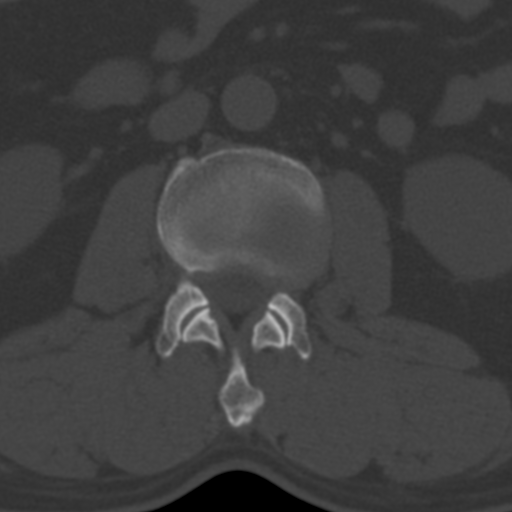
[im 14/89  bone]
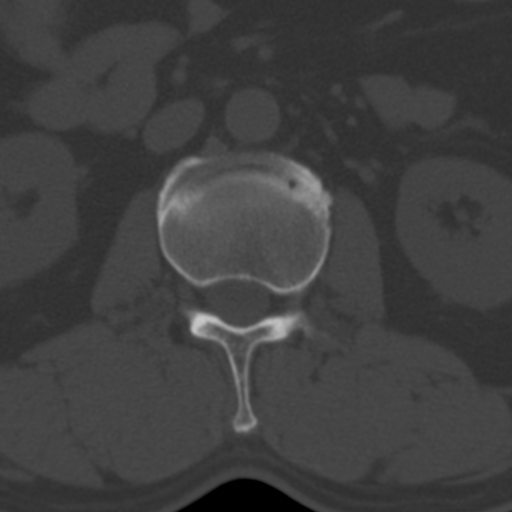
[im 21/89  bone]
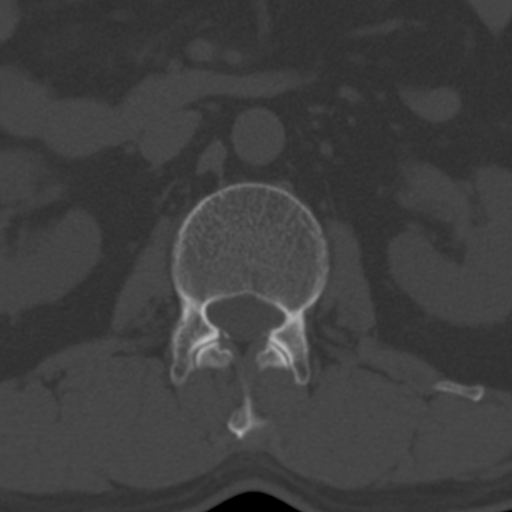
[im 28/89  bone]
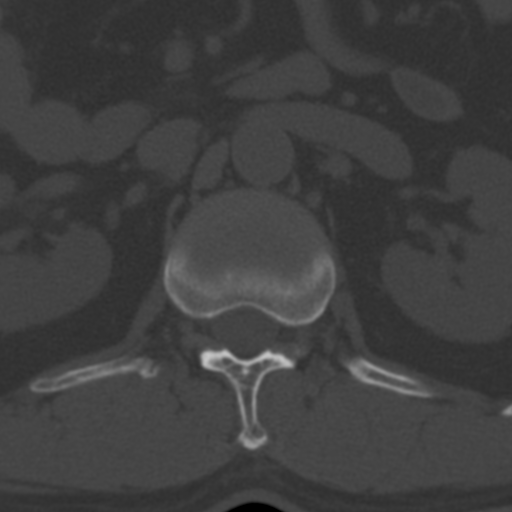
[im 34/89  soft-tissue]
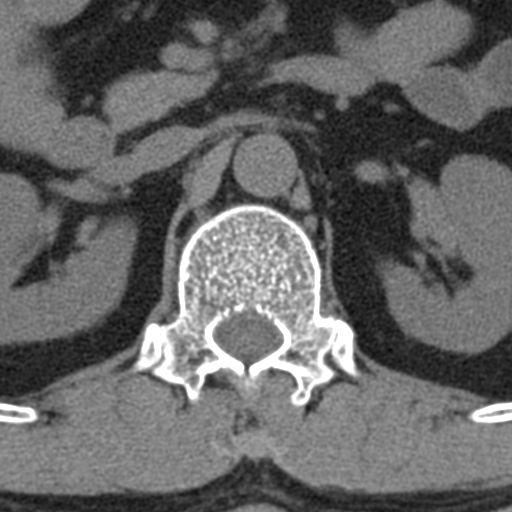
[im 34/89  bone]
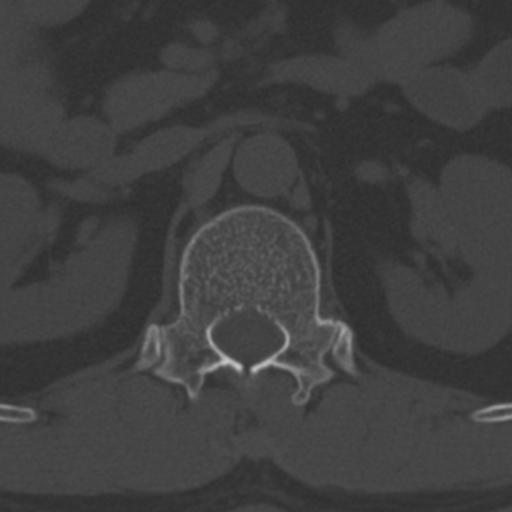
[im 41/89  bone]
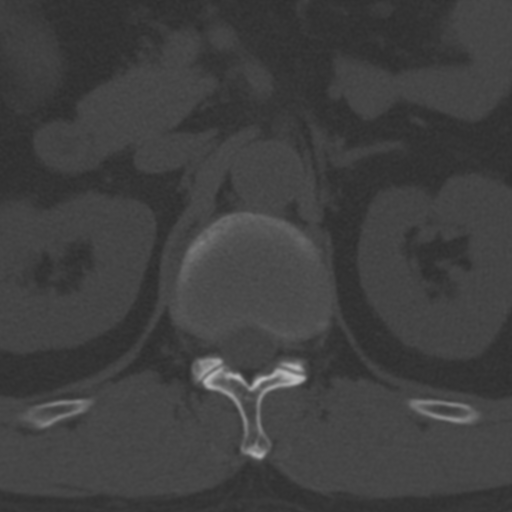
[im 48/89  bone]
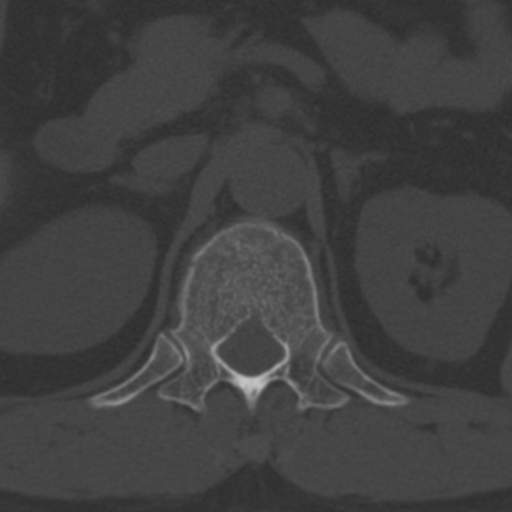
[im 55/89  bone]
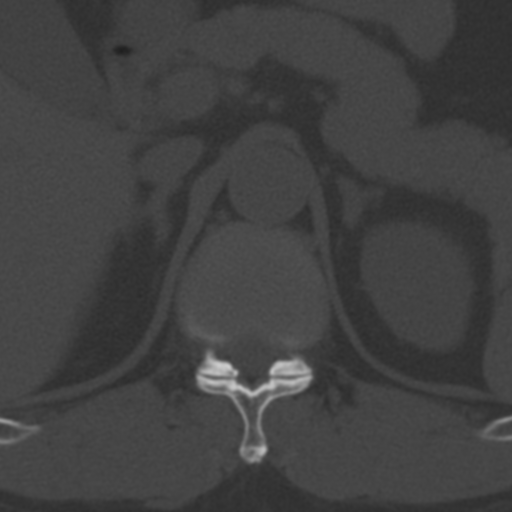
[im 61/89  soft-tissue]
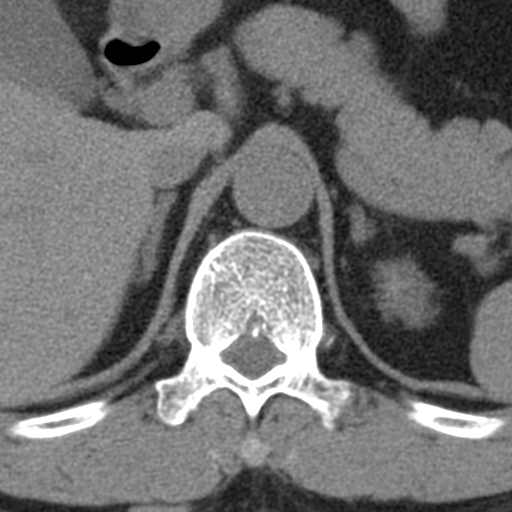
[im 61/89  bone]
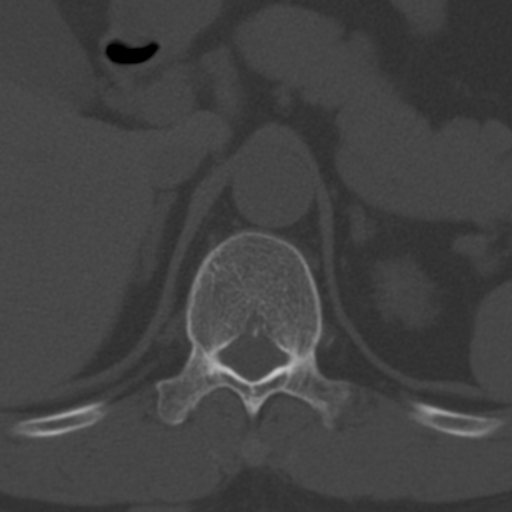
[im 68/89  bone]
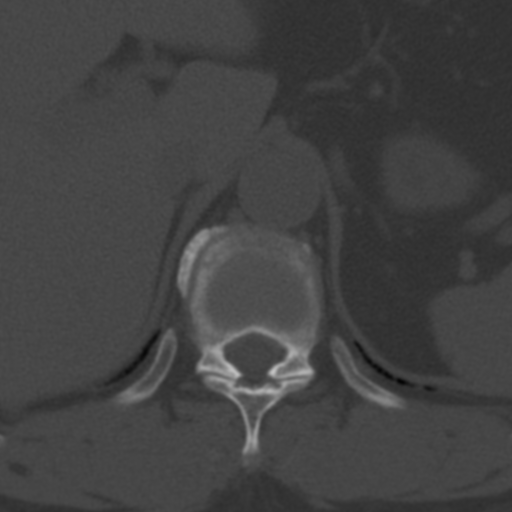
[im 75/89  bone]
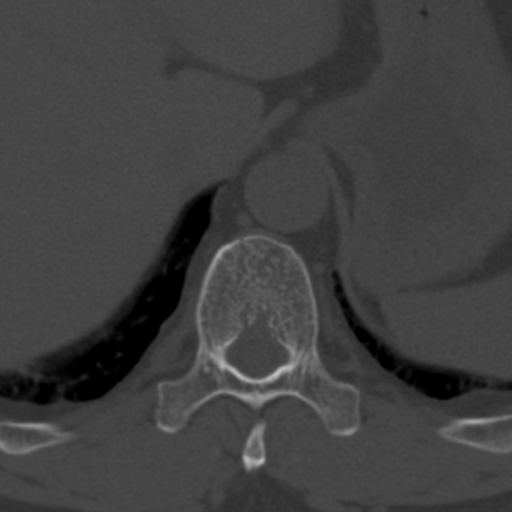
[im 82/89  bone]
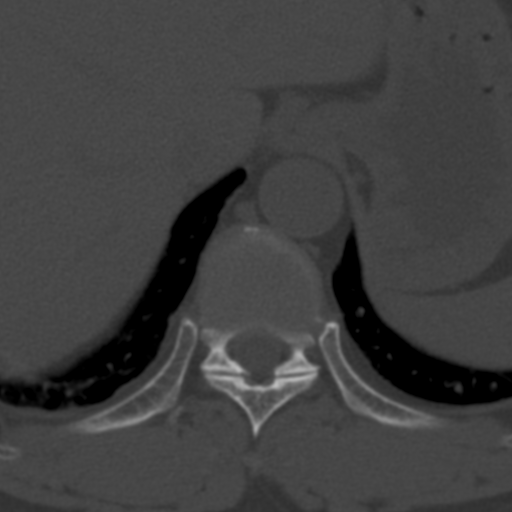

[12 of 14 positions shown; findings below may reference images not displayed]

FINDINGS: There is an acute relatively poorly characterized
compression fracture involving vertebral body T12, with a mildly
displaced fragment arising at the anterior inferior endplate.
Fracture lines extend across the vertebral body, but are not well
seen; there is resultant mild anterior wedging and approximately
20% loss of height.

Adjacent intervertebral disc spaces are preserved.  There is no
evidence of extension to the posterior elements.  No retropulsion
is seen.  No additional fractures are identified.  No significant
surrounding soft tissue abnormalities are characterized.

Vertebral body numbering assumes five lumbar vertebral bodies, with
transitional L1 anatomy.

The visualized soft tissues are grossly unremarkable in appearance.
The paraspinal musculature is within normal limits.
IMPRESSION: Acute compression fracture involving vertebral body T12, with a
mildly displaced fracture fragment arising from the anterior
inferior endplate.  Approximately 20% loss of height noted, with
mild anterior wedging.  No evidence of extension to the posterior
elements; no retropulsion seen.  Adjacent intervertebral disc
spaces are preserved.

Vertebral body numbering assumes five lumbar vertebral bodies, with
transitional L1 anatomy.

## 2011-07-04 MED ORDER — OXYCODONE-ACETAMINOPHEN 5-325 MG PO TABS: 2.0000 | ORAL_TABLET | Freq: Once | ORAL | Status: AC

## 2011-07-04 NOTE — ED Provider Notes (Signed)
Medical screening examination/treatment/procedure(s) were conducted as a shared visit with non-physician practitioner(s) and myself.  I personally evaluated the patient during the encounter   Joya Gaskins, MD 07/04/11 [id number]

## 2011-07-13 ENCOUNTER — Emergency Department (INDEPENDENT_AMBULATORY_CARE_PROVIDER_SITE_OTHER)
Admission: EM | Admit: 2011-07-13 | Discharge: 2011-07-13 | Disposition: A | Discharge status: Home / Self Care | Payer: Self-pay | Source: Home / Self Care

## 2011-07-13 ENCOUNTER — Encounter (HOSPITAL_COMMUNITY): Payer: Self-pay | Admitting: Emergency Medicine

## 2011-07-13 DIAGNOSIS — Z4802 Encounter for removal of sutures: Secondary | ICD-10-CM

## 2011-07-13 NOTE — ED Provider Notes (Signed)
History     CSN: [id number] Arrival date & time: 07/13/2011  9:52 AM   None     Chief Complaint  Patient presents with  . Suture / Staple Removal    (Consider location/radiation/quality/duration/timing/severity/associated sxs/prior treatment) HPI Comments: Staples placed Lt temple area at ED 10 days ago. Healing well and no complaints. Presents for staple removal.   Patient is a 60 y.o. male presenting with suture removal. The history is provided by a relative. The history is limited by a language barrier. A language interpreter was used.  Suture / Staple Removal  The sutures were placed 7 to 10 days ago. There has been no treatment since the wound repair. There has been no drainage from the wound. There is no redness present. There is no swelling present. The pain has no pain.    History reviewed. No pertinent past medical history.  Past Surgical History  Procedure Date  . Eye surgery   . Eye surgery     work injury, left eye, blind in left eye    No family history on file.  History  Substance Use Topics  . Smoking status: Never Smoker   . Smokeless tobacco: Not on file  . Alcohol Use: No      Review of Systems  Constitutional: Negative for fever and chills.  Skin: Negative for color change.    Allergies  Review of patient's allergies indicates no known allergies.  Home Medications   Current Outpatient Rx  Name Route Sig Dispense Refill  . BAYER BACK & BODY PAIN EX ST PO Oral Take by mouth.      [name] NAPROXEN SODIUM 220 MG PO TABS Oral Take 220 mg by mouth 2 (two) times daily with a meal.      . OXYCODONE HCL 15 MG PO TABS Oral Take 15 mg by mouth every 4 (four) hours as needed.      . OXYCODONE-ACETAMINOPHEN 5-325 MG PO TABS Oral Take 2 tablets by mouth once. 20 tablet 0    BP 134/70  Pulse 65  Temp(Src) 98.4 F (36.9 C) (Oral)  Resp 16  SpO2 99%  Physical Exam  Nursing note and vitals reviewed. Constitutional: He appears well-developed and  well-nourished. No distress.  Skin: Skin is warm and dry. No erythema.       Well healed laceration Lt temple at hairline with 2 staples in place.  Psychiatric: He has a normal mood and affect.    ED Course  SUTURE REMOVAL Date/Time: 07/13/2011 10:59 AM Performed by: Melody Comas Authorized by: Melody Comas Consent: Verbal consent obtained. Written consent not obtained. Consent given by: patient Patient understanding: patient states understanding of the procedure being performed Body area: head/neck Location details: forehead Staples Removed: 2 Patient tolerance: Patient tolerated the procedure well with no immediate complications.   (including critical care time)  Labs Reviewed - No data to display No results found.   No diagnosis found.    MDM          Melody Comas, PA 07/13/11 1101

## 2011-07-13 NOTE — ED Notes (Signed)
Patient seen in [facility] ed after a fall .  Seen 12/5.  Patient here today for staple removal.  Site unremarkable

## 2011-07-13 NOTE — ED Notes (Signed)
Signed release of responsibility for interpretation

## 2013-09-03 DIAGNOSIS — H269 Unspecified cataract: Secondary | ICD-10-CM | POA: Diagnosis not present

## 2013-10-26 DIAGNOSIS — Z0000 Encounter for general adult medical examination without abnormal findings: Secondary | ICD-10-CM | POA: Diagnosis not present

## 2013-10-26 DIAGNOSIS — M8448XA Pathological fracture, other site, initial encounter for fracture: Secondary | ICD-10-CM | POA: Diagnosis not present

## 2014-05-09 DIAGNOSIS — H04123 Dry eye syndrome of bilateral lacrimal glands: Secondary | ICD-10-CM | POA: Diagnosis not present

## 2014-05-09 DIAGNOSIS — H2511 Age-related nuclear cataract, right eye: Secondary | ICD-10-CM | POA: Diagnosis not present

## 2014-05-09 DIAGNOSIS — H1789 Other corneal scars and opacities: Secondary | ICD-10-CM | POA: Diagnosis not present

## 2014-11-14 DIAGNOSIS — H04123 Dry eye syndrome of bilateral lacrimal glands: Secondary | ICD-10-CM | POA: Diagnosis not present

## 2014-11-14 DIAGNOSIS — H2511 Age-related nuclear cataract, right eye: Secondary | ICD-10-CM | POA: Diagnosis not present

## 2015-02-09 DIAGNOSIS — M12561 Traumatic arthropathy, right knee: Secondary | ICD-10-CM | POA: Diagnosis not present

## 2015-02-09 DIAGNOSIS — I10 Essential (primary) hypertension: Secondary | ICD-10-CM | POA: Diagnosis not present

## 2015-02-09 DIAGNOSIS — K219 Gastro-esophageal reflux disease without esophagitis: Secondary | ICD-10-CM | POA: Diagnosis not present

## 2015-02-14 DIAGNOSIS — M25862 Other specified joint disorders, left knee: Secondary | ICD-10-CM | POA: Diagnosis not present

## 2015-02-14 DIAGNOSIS — M25861 Other specified joint disorders, right knee: Secondary | ICD-10-CM | POA: Diagnosis not present

## 2015-07-13 DIAGNOSIS — Z23 Encounter for immunization: Secondary | ICD-10-CM | POA: Diagnosis not present

## 2015-07-13 DIAGNOSIS — I10 Essential (primary) hypertension: Secondary | ICD-10-CM | POA: Diagnosis not present

## 2015-08-24 DIAGNOSIS — I10 Essential (primary) hypertension: Secondary | ICD-10-CM | POA: Diagnosis not present

## 2015-08-24 DIAGNOSIS — Z9114 Patient's other noncompliance with medication regimen: Secondary | ICD-10-CM | POA: Diagnosis not present

## 2015-09-28 DIAGNOSIS — I10 Essential (primary) hypertension: Secondary | ICD-10-CM | POA: Diagnosis not present

## 2016-01-04 DIAGNOSIS — I10 Essential (primary) hypertension: Secondary | ICD-10-CM | POA: Diagnosis not present

## 2016-01-04 DIAGNOSIS — Z79899 Other long term (current) drug therapy: Secondary | ICD-10-CM | POA: Diagnosis not present

## 2016-01-04 DIAGNOSIS — Z23 Encounter for immunization: Secondary | ICD-10-CM | POA: Diagnosis not present

## 2016-01-04 DIAGNOSIS — Z0000 Encounter for general adult medical examination without abnormal findings: Secondary | ICD-10-CM | POA: Diagnosis not present

## 2016-01-22 DIAGNOSIS — H1789 Other corneal scars and opacities: Secondary | ICD-10-CM | POA: Diagnosis not present

## 2016-08-16 DIAGNOSIS — I10 Essential (primary) hypertension: Secondary | ICD-10-CM | POA: Diagnosis not present

## 2016-08-16 DIAGNOSIS — Z23 Encounter for immunization: Secondary | ICD-10-CM | POA: Diagnosis not present

## 2017-01-10 DIAGNOSIS — Z0000 Encounter for general adult medical examination without abnormal findings: Secondary | ICD-10-CM | POA: Diagnosis not present

## 2017-01-10 DIAGNOSIS — E782 Mixed hyperlipidemia: Secondary | ICD-10-CM | POA: Diagnosis not present

## 2017-01-10 DIAGNOSIS — Z125 Encounter for screening for malignant neoplasm of prostate: Secondary | ICD-10-CM | POA: Diagnosis not present

## 2017-01-10 DIAGNOSIS — R351 Nocturia: Secondary | ICD-10-CM | POA: Diagnosis not present

## 2017-01-10 DIAGNOSIS — Z1211 Encounter for screening for malignant neoplasm of colon: Secondary | ICD-10-CM | POA: Diagnosis not present

## 2017-01-10 DIAGNOSIS — I10 Essential (primary) hypertension: Secondary | ICD-10-CM | POA: Diagnosis not present

## 2017-07-11 DIAGNOSIS — I10 Essential (primary) hypertension: Secondary | ICD-10-CM | POA: Diagnosis not present

## 2017-07-11 DIAGNOSIS — R351 Nocturia: Secondary | ICD-10-CM | POA: Diagnosis not present

## 2017-07-11 DIAGNOSIS — E782 Mixed hyperlipidemia: Secondary | ICD-10-CM | POA: Diagnosis not present

## 2017-07-11 DIAGNOSIS — R7309 Other abnormal glucose: Secondary | ICD-10-CM | POA: Diagnosis not present

## 2017-07-11 DIAGNOSIS — R110 Nausea: Secondary | ICD-10-CM | POA: Diagnosis not present

## 2018-02-17 DIAGNOSIS — Z0000 Encounter for general adult medical examination without abnormal findings: Secondary | ICD-10-CM | POA: Diagnosis not present

## 2018-02-17 DIAGNOSIS — Z87891 Personal history of nicotine dependence: Secondary | ICD-10-CM | POA: Diagnosis not present

## 2018-02-17 DIAGNOSIS — I10 Essential (primary) hypertension: Secondary | ICD-10-CM | POA: Diagnosis not present

## 2018-02-17 DIAGNOSIS — E782 Mixed hyperlipidemia: Secondary | ICD-10-CM | POA: Diagnosis not present

## 2018-02-17 DIAGNOSIS — R413 Other amnesia: Secondary | ICD-10-CM | POA: Diagnosis not present

## 2018-02-17 DIAGNOSIS — R7309 Other abnormal glucose: Secondary | ICD-10-CM | POA: Diagnosis not present

## 2018-02-17 DIAGNOSIS — Z139 Encounter for screening, unspecified: Secondary | ICD-10-CM | POA: Diagnosis not present

## 2018-02-17 DIAGNOSIS — Z1211 Encounter for screening for malignant neoplasm of colon: Secondary | ICD-10-CM | POA: Diagnosis not present

## 2018-08-21 ENCOUNTER — Ambulatory Visit: Payer: Self-pay | Admitting: Nurse Practitioner

## 2018-09-02 ENCOUNTER — Other Ambulatory Visit: Payer: Self-pay | Admitting: Nurse Practitioner

## 2018-09-10 ENCOUNTER — Ambulatory Visit (INDEPENDENT_AMBULATORY_CARE_PROVIDER_SITE_OTHER): Payer: Medicare Other | Admitting: Nurse Practitioner

## 2018-09-10 ENCOUNTER — Encounter: Payer: Self-pay | Admitting: Nurse Practitioner

## 2018-09-10 VITALS — BP 142/70 | HR 66 | Temp 98.5°F | Ht 63.4 in | Wt 166.2 lb

## 2018-09-10 DIAGNOSIS — H5462 Unqualified visual loss, left eye, normal vision right eye: Secondary | ICD-10-CM | POA: Diagnosis not present

## 2018-09-10 DIAGNOSIS — Z0101 Encounter for examination of eyes and vision with abnormal findings: Secondary | ICD-10-CM

## 2018-09-10 DIAGNOSIS — E782 Mixed hyperlipidemia: Secondary | ICD-10-CM | POA: Insufficient documentation

## 2018-09-10 DIAGNOSIS — I10 Essential (primary) hypertension: Secondary | ICD-10-CM

## 2018-09-10 MED ORDER — ATORVASTATIN CALCIUM 10 MG PO TABS: 10.0000 mg | ORAL_TABLET | Freq: Every day | ORAL | 1 refills | Status: DC

## 2018-09-10 MED ORDER — LISINOPRIL 5 MG PO TABS: 5.0000 mg | ORAL_TABLET | Freq: Every day | ORAL | 1 refills | Status: DC

## 2018-09-10 NOTE — Progress Notes (Signed)
Subjective:     Patient ID: Douglas Pruitt , male    DOB: July 26, 1951 , 68 y.o.   MRN: [id number]   Chief Complaint  Patient presents with  . Hypertension    HPI  Hypertension  This is a chronic problem. The current episode started more than 1 year ago. The problem is controlled. Pertinent negatives include no anxiety, blurred vision, chest pain, headaches or palpitations. There are no associated agents to hypertension. There are no known risk factors for coronary artery disease. There is no history of angina. There is no history of chronic renal disease.     No past medical history on file.   No family history on file.   Current Outpatient Medications:  .  atorvastatin (LIPITOR) 10 MG tablet, Take 10 mg by mouth daily., Disp: , Rfl:  .  lisinopril (PRINIVIL,ZESTRIL) 5 MG tablet, Take 5 mg by mouth daily., Disp: , Rfl:    No Known Allergies   Review of Systems  Constitutional: Negative for fatigue.  Eyes: Negative for blurred vision.  Respiratory: Negative.  Negative for cough.   Cardiovascular: Negative.  Negative for chest pain, palpitations and leg swelling.  Neurological: Negative for dizziness and headaches.     Today's Vitals   09/10/18 1557  BP: (!) 142/70  Pulse: 66  Temp: 98.5 F (36.9 C)  TempSrc: Oral  Weight: 166 lb 3.2 oz (75.4 kg)  Height: 5' 3.4" (1.61 m)  PainSc: 0-No pain   Body mass index is 29.07 kg/m.   Objective:  Physical Exam Vitals signs reviewed.  Constitutional:      General: He is not in acute distress.    Appearance: Normal appearance.  Cardiovascular:     Rate and Rhythm: Normal rate and regular rhythm.     Pulses: Normal pulses.     Heart sounds: Normal heart sounds. No murmur.  Pulmonary:     Effort: Pulmonary effort is normal. No respiratory distress.     Breath sounds: Normal breath sounds. No wheezing.  Neurological:     General: No focal deficit present.     Mental Status: He is alert and oriented to person, place, and time.      Comments: With the interpretation of his daughter  Psychiatric:        Mood and Affect: Mood normal.         Assessment And Plan:     1. Essential hypertension . B/P is fair control.  . CMP ordered to check renal function.  - lisinopril (PRINIVIL,ZESTRIL) 5 MG tablet; Take 1 tablet (5 mg total) by mouth daily.  Dispense: 90 tablet; Refill: 1  2. Mixed hyperlipidemia  Chronic, controlled  Continue with current medications - atorvastatin (LIPITOR) 10 MG tablet; Take 1 tablet (10 mg total) by mouth daily.  Dispense: 90 tablet; Refill: 1  3. Vision loss, left eye  Previous history of left eye injury that caused vision loss  He continues to work as a Designer, fashion/clothing full time, he has not had [facility] eye exam in approximately 2 - 3 years.   - Ambulatory referral to Ophthalmology  4. Encounter for examination of eyes and vision with abnormal findings  - Ambulatory referral to Ophthalmology      [name], FNP

## 2018-09-11 LAB — CMP14 + ANION GAP
A/G RATIO: 1.3 (ref 1.2–2.2)
ALBUMIN: 4 g/dL (ref 3.8–4.8)
ALK PHOS: 63 IU/L (ref 39–117)
ALT: 12 IU/L (ref 0–44)
AST: 16 IU/L (ref 0–40)
Anion Gap: 16 mmol/L (ref 10.0–18.0)
BILIRUBIN TOTAL: 0.5 mg/dL (ref 0.0–1.2)
BUN / CREAT RATIO: 10 (ref 10–24)
BUN: 9 mg/dL (ref 8–27)
CHLORIDE: 106 mmol/L (ref 96–106)
CO2: 22 mmol/L (ref 20–29)
Calcium: 8.9 mg/dL (ref 8.6–10.2)
Creatinine, Ser: 0.93 mg/dL (ref 0.76–1.27)
GFR calc non Af Amer: 83 mL/min/{1.73_m2} (ref >59)
GFR, EST AFRICAN AMERICAN: 96 mL/min/{1.73_m2} (ref >59)
GLUCOSE: 119 mg/dL — AB (ref 65–99)
Globulin, Total: 3.1 g/dL (ref 1.5–4.5)
POTASSIUM: 3.6 mmol/L (ref 3.5–5.2)
Sodium: 144 mmol/L (ref 134–144)
TOTAL PROTEIN: 7.1 g/dL (ref 6.0–8.5)

## 2018-10-13 DIAGNOSIS — H5201 Hypermetropia, right eye: Secondary | ICD-10-CM | POA: Diagnosis not present

## 2018-10-13 DIAGNOSIS — H04123 Dry eye syndrome of bilateral lacrimal glands: Secondary | ICD-10-CM | POA: Diagnosis not present

## 2018-10-13 DIAGNOSIS — H25041 Posterior subcapsular polar age-related cataract, right eye: Secondary | ICD-10-CM | POA: Diagnosis not present

## 2018-12-23 ENCOUNTER — Telehealth: Payer: Self-pay

## 2018-12-23 NOTE — Telephone Encounter (Signed)
I called pt to see if he is still taking her lisinopril as directed because he is not filling it as she should per [facility].     Patient's daughter returned call and stated he is taking his lisinopril every other day and he picked up his refill in feb. YRL,RMA

## 2018-12-23 NOTE — Telephone Encounter (Signed)
I called pt to see if he is still taking her lisinopril as directed because he is not filling it as she should per [facility]. Left pt a v/m to call office [facility]

## 2019-02-24 ENCOUNTER — Ambulatory Visit: Payer: Self-pay | Admitting: Nurse Practitioner

## 2019-02-24 ENCOUNTER — Telehealth: Payer: Self-pay

## 2019-02-24 ENCOUNTER — Ambulatory Visit: Payer: Self-pay

## 2019-02-24 NOTE — Telephone Encounter (Signed)
This nurse attempted to contact patient due to missing our appointment. Message left on voicemail to call back to reschedule.

## 2019-02-26 ENCOUNTER — Telehealth: Payer: Self-pay

## 2019-02-26 NOTE — Telephone Encounter (Signed)
LVM for pt to call to reschedule missed appts 02/26/2019

## 2019-03-04 ENCOUNTER — Telehealth: Payer: Self-pay | Admitting: Nurse Practitioner

## 2019-03-04 NOTE — Telephone Encounter (Signed)
I spoke with the patient's daughter and rescheduled the appointment that was missed on 02/24/2019. VDM (DD)

## 2019-03-11 ENCOUNTER — Ambulatory Visit (INDEPENDENT_AMBULATORY_CARE_PROVIDER_SITE_OTHER): Payer: Medicare Other | Admitting: Nurse Practitioner

## 2019-03-11 ENCOUNTER — Telehealth: Payer: Self-pay | Admitting: Nurse Practitioner

## 2019-03-11 ENCOUNTER — Ambulatory Visit: Payer: Self-pay

## 2019-03-11 ENCOUNTER — Other Ambulatory Visit: Payer: Self-pay

## 2019-03-11 ENCOUNTER — Encounter: Payer: Self-pay | Admitting: Nurse Practitioner

## 2019-03-11 VITALS — BP 142/76 | HR 66 | Temp 98.4°F | Ht 62.8 in | Wt 166.0 lb

## 2019-03-11 DIAGNOSIS — Z1211 Encounter for screening for malignant neoplasm of colon: Secondary | ICD-10-CM

## 2019-03-11 DIAGNOSIS — R7309 Other abnormal glucose: Secondary | ICD-10-CM

## 2019-03-11 DIAGNOSIS — Z9189 Other specified personal risk factors, not elsewhere classified: Secondary | ICD-10-CM | POA: Diagnosis not present

## 2019-03-11 DIAGNOSIS — Z1159 Encounter for screening for other viral diseases: Secondary | ICD-10-CM | POA: Diagnosis not present

## 2019-03-11 DIAGNOSIS — I10 Essential (primary) hypertension: Secondary | ICD-10-CM

## 2019-03-11 DIAGNOSIS — E782 Mixed hyperlipidemia: Secondary | ICD-10-CM | POA: Diagnosis not present

## 2019-03-11 MED ORDER — LISINOPRIL 5 MG PO TABS: 5.0000 mg | ORAL_TABLET | Freq: Every day | ORAL | 1 refills | Status: DC

## 2019-03-11 MED ORDER — ATORVASTATIN CALCIUM 10 MG PO TABS: 10.0000 mg | ORAL_TABLET | Freq: Every day | ORAL | 1 refills | Status: DC

## 2019-03-11 NOTE — Telephone Encounter (Signed)
I left a message with the patient's daughter to confirm this afternoon's appointment with Doreene Burke.  Will reschedule AWV with Nickeah later. VDM (DD)

## 2019-03-11 NOTE — Progress Notes (Signed)
Subjective:     Patient ID: Douglas Pruitt , male    DOB: 02-03-1951 , 68 y.o.   MRN: [id number]   Chief Complaint  Patient presents with  . Hypertension    HPI  Hypertension This is a chronic problem. The current episode started more than 1 year ago. The problem is uncontrolled. Pertinent negatives include no anxiety, blurred vision, chest pain, headaches or palpitations. There are no associated agents to hypertension. Risk factors for coronary artery disease include male gender and sedentary lifestyle. There are no compliance problems.  There is no history of angina. There is no history of chronic renal disease.     No past medical history on file.   No family history on file.   Current Outpatient Medications:  .  atorvastatin (LIPITOR) 10 MG tablet, Take 1 tablet (10 mg total) by mouth daily., Disp: 90 tablet, Rfl: 1 .  lisinopril (PRINIVIL,ZESTRIL) 5 MG tablet, Take 1 tablet (5 mg total) by mouth daily., Disp: 90 tablet, Rfl: 1   No Known Allergies   Review of Systems  Constitutional: Negative for fatigue.  Eyes: Negative for blurred vision.  Respiratory: Negative.  Negative for cough.   Cardiovascular: Negative.  Negative for chest pain, palpitations and leg swelling.  Neurological: Negative for dizziness and headaches.     Today's Vitals   03/11/19 1543  BP: (!) 142/76  Pulse: 66  Temp: 98.4 F (36.9 C)  TempSrc: Oral  Weight: 166 lb (75.3 kg)  Height: 5' 2.8" (1.595 m)  PainSc: 0-No pain   Body mass index is 29.59 kg/m.   Objective:  Physical Exam Vitals signs reviewed.  Constitutional:      General: He is not in acute distress.    Appearance: Normal appearance.  Cardiovascular:     Rate and Rhythm: Normal rate and regular rhythm.     Pulses: Normal pulses.     Heart sounds: Normal heart sounds. No murmur.  Pulmonary:     Effort: Pulmonary effort is normal. No respiratory distress.     Breath sounds: Normal breath sounds. No wheezing.  Neurological:      General: No focal deficit present.     Mental Status: He is alert and oriented to person, place, and time.     Comments: With the interpretation of his daughter  Psychiatric:        Mood and Affect: Mood normal.         Assessment And Plan:     1. Essential hypertension . B/P is fairly controlled, will not make any changes to his medications  . BMP ordered to check renal function.  . The importance of regular exercise and dietary modification was stressed to the patient.  - BMP8+eGFR - lisinopril (ZESTRIL) 5 MG tablet; Take 1 tablet (5 mg total) by mouth daily.  Dispense: 90 tablet; Refill: 1  2. Mixed hyperlipidemia  Chronic, controlled  Continue with current medications  No issues with muscle weakness  - Lipid panel - atorvastatin (LIPITOR) 10 MG tablet; Take 1 tablet (10 mg total) by mouth daily.  Dispense: 90 tablet; Refill: 1  3. Abnormal glucose  Chronic, stable  No current medications  Encouraged to limit intake of sugary foods and drinks - Hemoglobin A1c  4. Encounter for screening colonoscopy  According to USPTF Colorectal cancer Screening guidelines. Colonoscopy is recommended every 10 years, starting at age 85years.  Will refer to GI for colon cancer screening. - Ambulatory referral to Gastroenterology  5. Encounter for hepatitis C  virus screening test for high risk patient  Daughter reports his wife was diagnosed with hepatitis c in 2017 but he has not been tested - Hepatitis C antibody       [name], FNP

## 2019-03-12 LAB — BMP8+EGFR
BUN/Creatinine Ratio: 14 (ref 10–24)
BUN: 15 mg/dL (ref 8–27)
CO2: 22 mmol/L (ref 20–29)
Calcium: 9.2 mg/dL (ref 8.6–10.2)
Chloride: 102 mmol/L (ref 96–106)
Creatinine, Ser: 1.06 mg/dL (ref 0.76–1.27)
GFR calc Af Amer: 82 mL/min/{1.73_m2} (ref >59)
GFR calc non Af Amer: 71 mL/min/{1.73_m2} (ref >59)
Glucose: 106 mg/dL — ABNORMAL HIGH (ref 65–99)
Potassium: 4.2 mmol/L (ref 3.5–5.2)
Sodium: 139 mmol/L (ref 134–144)

## 2019-03-12 LAB — LIPID PANEL
Chol/HDL Ratio: 5.7 ratio — ABNORMAL HIGH (ref 0.0–5.0)
Cholesterol, Total: 235 mg/dL — ABNORMAL HIGH (ref 100–199)
HDL: 41 mg/dL (ref >39)
LDL Calculated: 140 mg/dL — ABNORMAL HIGH (ref 0–99)
Triglycerides: 268 mg/dL — ABNORMAL HIGH (ref 0–149)
VLDL Cholesterol Cal: 54 mg/dL — ABNORMAL HIGH (ref 5–40)

## 2019-03-12 LAB — HEMOGLOBIN A1C
Est. average glucose Bld gHb Est-mCnc: 114 mg/dL
Hgb A1c MFr Bld: 5.6 % (ref 4.8–5.6)

## 2019-03-12 LAB — HEPATITIS C ANTIBODY: Hep C Virus Ab: 0.1 s/co ratio (ref 0.0–0.9)

## 2019-03-15 ENCOUNTER — Other Ambulatory Visit: Payer: Self-pay | Admitting: Nurse Practitioner

## 2019-03-15 DIAGNOSIS — E782 Mixed hyperlipidemia: Secondary | ICD-10-CM

## 2019-03-15 MED ORDER — ATORVASTATIN CALCIUM 20 MG PO TABS: 10.0000 mg | ORAL_TABLET | Freq: Every day | ORAL | 1 refills | Status: DC

## 2019-03-15 NOTE — Progress Notes (Signed)
He will need [place] appt in 3 months instead of 6 months.

## 2019-04-08 ENCOUNTER — Other Ambulatory Visit: Payer: Self-pay

## 2019-04-08 DIAGNOSIS — Z20822 Contact with and (suspected) exposure to covid-19: Secondary | ICD-10-CM

## 2019-04-08 DIAGNOSIS — R6889 Other general symptoms and signs: Secondary | ICD-10-CM | POA: Diagnosis not present

## 2019-04-09 LAB — NOVEL CORONAVIRUS, NAA: SARS-CoV-2, NAA: NOT DETECTED

## 2019-04-22 ENCOUNTER — Telehealth: Payer: Self-pay | Admitting: Nurse Practitioner

## 2019-04-22 NOTE — Chronic Care Management (AMB) (Signed)
Chronic Care Management   Note  04/22/2019 Name: Douglas Pruitt MRN: [id number] DOB: 07-21-1951  Douglas Pruitt is a 68 y.o. year old male who is a primary care patient of Zaylin, Dyess, FNP. I reached out to Zair Wiginton by phone today in response to a referral sent by [name] patient's health plan.     [name] was given information about Chronic Care Management services today including:  1. CCM service includes personalized support from designated clinical staff supervised by his physician, including individualized plan of care and coordination with other care providers 2. 24/7 contact phone numbers for assistance for urgent and routine care needs. 3. Service will only be billed when office clinical staff spend 20 minutes or more in a month to coordinate care. 4. Only one practitioner may furnish and bill the service in a calendar month. 5. The patient may stop CCM services at any time (effective at the end of the month) by phone call to the office staff. 6. The patient will be responsible for cost sharing (co-pay) of up to 20% of the service fee (after annual deductible is met).  Patients daughter [name] did not agree to enrollment in care management services and does not wish to consider at this time.  Follow up plan: The patient has been provided with contact information for the chronic care management team and has been advised to call with any health related questions or concerns.   Randon Goldsmith Care Guide  [facility]   Connected Care  ??[email or web address]   ??[phone]

## 2019-04-26 ENCOUNTER — Encounter: Payer: Self-pay | Admitting: Nurse Practitioner

## 2019-05-20 ENCOUNTER — Telehealth: Payer: Self-pay

## 2019-05-20 ENCOUNTER — Encounter (HOSPITAL_COMMUNITY): Payer: Self-pay | Admitting: Emergency Medicine

## 2019-05-20 ENCOUNTER — Other Ambulatory Visit: Payer: Self-pay

## 2019-05-20 ENCOUNTER — Emergency Department (HOSPITAL_COMMUNITY)
Admission: EM | Admit: 2019-05-20 | Discharge: 2019-05-20 | Disposition: A | Discharge status: Home / Self Care | Payer: Medicare Other | Attending: Emergency Medicine | Admitting: Emergency Medicine

## 2019-05-20 DIAGNOSIS — Z5321 Procedure and treatment not carried out due to patient leaving prior to being seen by health care provider: Secondary | ICD-10-CM | POA: Diagnosis not present

## 2019-05-20 DIAGNOSIS — R05 Cough: Secondary | ICD-10-CM | POA: Insufficient documentation

## 2019-05-20 NOTE — Telephone Encounter (Signed)
Patient's daughter called stating he got exposed to covid 19 last week and he has not been feeling well. He has been having headaches and loss of appetite and sense of smell. I advised her that she needs to take him to the ER. YRL,RMA

## 2019-05-20 NOTE — ED Notes (Signed)
Pt's daughter states that she is taking him home due to the wait time. States that if he gets any worse that she will bring him back.

## 2019-05-20 NOTE — ED Triage Notes (Signed)
Pt in with c/o recent Covid exposure 1 wk ago, by nearby neighbors. C/o cough, chills, loss of smell and sneezing. States cough began 1.5 wks ago. sats 97% on RA, denies sob

## 2019-05-24 ENCOUNTER — Other Ambulatory Visit: Payer: Self-pay

## 2019-05-24 ENCOUNTER — Encounter: Payer: Self-pay | Admitting: Nurse Practitioner

## 2019-05-24 ENCOUNTER — Telehealth (INDEPENDENT_AMBULATORY_CARE_PROVIDER_SITE_OTHER): Payer: Medicare Other | Admitting: Nurse Practitioner

## 2019-05-24 VITALS — Wt 166.0 lb

## 2019-05-24 DIAGNOSIS — R6883 Chills (without fever): Secondary | ICD-10-CM

## 2019-05-24 DIAGNOSIS — R05 Cough: Secondary | ICD-10-CM

## 2019-05-24 DIAGNOSIS — Z20828 Contact with and (suspected) exposure to other viral communicable diseases: Secondary | ICD-10-CM | POA: Diagnosis not present

## 2019-05-24 DIAGNOSIS — J069 Acute upper respiratory infection, unspecified: Secondary | ICD-10-CM | POA: Diagnosis not present

## 2019-05-24 DIAGNOSIS — R059 Cough, unspecified: Secondary | ICD-10-CM

## 2019-05-24 MED ORDER — BENZONATATE 100 MG PO CAPS: 100.0000 mg | ORAL_CAPSULE | Freq: Four times a day (QID) | ORAL | 1 refills | Status: DC | PRN

## 2019-05-24 MED ORDER — AZITHROMYCIN 250 MG PO TABS: ORAL_TABLET | ORAL | 0 refills | Status: AC

## 2019-05-24 NOTE — Progress Notes (Signed)
Virtual Visit via Video   This visit type was conducted due to national recommendations for restrictions regarding the COVID-19 Pandemic (e.g. social distancing) in [place] effort to limit this patient's exposure and mitigate transmission in our community.  Due to his co-morbid illnesses, this patient is at least at moderate risk for complications without adequate follow up.  This format is felt to be most appropriate for this patient at this time.  All issues noted in this document were discussed and addressed.  A limited physical exam was performed with this format.    This visit type was conducted due to national recommendations for restrictions regarding the COVID-19 Pandemic (e.g. social distancing) in [name] effort to limit this patient's exposure and mitigate transmission in our community.  Patients identity confirmed using two different identifiers.  This format is felt to be most appropriate for this patient at this time.  All issues noted in this document were discussed and addressed.  No physical exam was performed (except for noted visual exam findings with Video Visits).    Date:  05/24/2019   ID:  Douglas Pruitt, DOB Aug 17, 1950, MRN [id number]  Patient Location:  Home - spoke with Douglas Pruitt and his daughter  Provider location:   Office    Chief Complaint:  Exposure to covid and chills, feels bad  History of Present Illness:    Douglas Pruitt is a 68 y.o. male who presents via video conferencing for a telehealth visit today.    The patient does have symptoms concerning for COVID-19 infection (fever, chills, cough, or new shortness of breath).   He has been exposed to a neighbor who had coronavirus.  She took him to the ER on Thursday but did not stay due to the wait.  He had a low grade fever.  He was feeling weak.   URI  This is a new problem. The current episode started 1 to 4 weeks ago (2 weeks ). There has been no fever (low grade fever on Thursday). Associated symptoms include coughing  (occasional). Pertinent negatives include no congestion, sinus pain, sore throat or wheezing. Associated symptoms comments: Loss of taste and smell eating better over the weekend. He has tried acetaminophen for the symptoms.     History reviewed. No pertinent past medical history. Past Surgical History:  Procedure Laterality Date  . EYE SURGERY    . EYE SURGERY     work injury, left eye, blind in left eye     Current Meds  Medication Sig  . atorvastatin (LIPITOR) 20 MG tablet Take 0.5 tablets (10 mg total) by mouth daily.  [name] lisinopril (ZESTRIL) 5 MG tablet Take 1 tablet (5 mg total) by mouth daily.     Allergies:   Patient has no known allergies.   Social History   Tobacco Use  . Smoking status: Never Smoker  . Smokeless tobacco: Never Used  Substance Use Topics  . Alcohol use: No  . Drug use: No     Family Hx: The patient's family history is not on file.  ROS:   Please see the history of present illness.    Review of Systems  Constitutional: Positive for chills. Negative for fever and malaise/fatigue.  HENT: Negative for congestion, sinus pain and sore throat.   Respiratory: Positive for cough (occasional). Negative for shortness of breath and wheezing.   Cardiovascular: Negative.   Neurological: Negative for dizziness and tingling.  Psychiatric/Behavioral: Negative.     All other systems reviewed and are negative.  Labs/Other Tests and Data Reviewed:    Recent Labs: 09/10/2018: ALT 12 03/11/2019: BUN 15; Creatinine, Ser 1.06; Potassium 4.2; Sodium 139   Recent Lipid Panel Lab Results  Component Value Date/Time   CHOL 235 (H) 03/11/2019 04:22 PM   TRIG 268 (H) 03/11/2019 04:22 PM   HDL 41 03/11/2019 04:22 PM   CHOLHDL 5.7 (H) 03/11/2019 04:22 PM   LDLCALC 140 (H) 03/11/2019 04:22 PM    Wt Readings from Last 3 Encounters:  05/24/19 166 lb (75.3 kg)  05/20/19 166 lb 0.1 oz (75.3 kg)  03/11/19 166 lb (75.3 kg)     Exam:    Vital Signs:  Wt 166 lb  (75.3 kg)   BMI 29.59 kg/m     Physical Exam  Constitutional: He is oriented to person, place, and time. No distress.  Appears ill.  Neurological: He is alert and oriented to person, place, and time.  Psychiatric: Mood, memory, affect and judgment normal.    ASSESSMENT & PLAN:    1. Viral upper respiratory tract infection  Now at day #9 continues to have a poor appetite intermittent chills  Will treat with azithromycin for cough  He is to go for coronavirus testing tomorrow due to recent exposure - Novel Coronavirus, NAA ([facility]) - azithromycin (ZITHROMAX) 250 MG tablet; Take 2 tablets (500 mg) on  Day 1,  followed by 1 tablet (250 mg) once daily on Days 2 through 5.  Dispense: 6 each; Refill: 0  2. Cough  Tessalon perles as needed  The antibiotic will be effective if this is a URI vs pneumonia. - Novel Coronavirus, NAA ([facility]) - benzonatate (TESSALON PERLES) 100 MG capsule; Take 1 capsule (100 mg total) by mouth every 6 (six) hours as needed.  Dispense: 30 capsule; Refill: 1  3. Chills  Denies fever at this time  Advised to go to ER if has shortness of breath - Novel Coronavirus, NAA ([facility]) - azithromycin (ZITHROMAX) 250 MG tablet; Take 2 tablets (500 mg) on  Day 1,  followed by 1 tablet (250 mg) once daily on Days 2 through 5.  Dispense: 6 each; Refill: 0   COVID-19 Education: The signs and symptoms of COVID-19 were discussed with the patient and how to seek care for testing (follow up with PCP or arrange E-visit).  The importance of social distancing was discussed today.  Patient Risk:   After full review of this patients clinical status, I feel that they are at least moderate risk at this time.  Time:   Today, I have spent 12 minutes/ seconds with the patient with telehealth technology discussing above diagnoses.     Medication Adjustments/Labs and Tests Ordered: Current medicines are reviewed at length with the patient today.  Concerns regarding  medicines are outlined above.   Tests Ordered: Orders Placed This Encounter  Procedures  . Novel Coronavirus, NAA ([facility])    Medication Changes: Meds ordered this encounter  Medications  . azithromycin (ZITHROMAX) 250 MG tablet    Sig: Take 2 tablets (500 mg) on  Day 1,  followed by 1 tablet (250 mg) once daily on Days 2 through 5.    Dispense:  6 each    Refill:  0  . benzonatate (TESSALON PERLES) 100 MG capsule    Sig: Take 1 capsule (100 mg total) by mouth every 6 (six) hours as needed.    Dispense:  30 capsule    Refill:  1    Disposition:  Follow up prn  Signed, Lolita Cram  Christell Constant, FNP

## 2019-05-25 ENCOUNTER — Other Ambulatory Visit: Payer: Self-pay

## 2019-05-25 DIAGNOSIS — Z20828 Contact with and (suspected) exposure to other viral communicable diseases: Secondary | ICD-10-CM | POA: Diagnosis not present

## 2019-05-25 DIAGNOSIS — Z20822 Contact with and (suspected) exposure to covid-19: Secondary | ICD-10-CM

## 2019-05-26 LAB — NOVEL CORONAVIRUS, NAA: SARS-CoV-2, NAA: DETECTED — AB

## 2019-05-27 ENCOUNTER — Other Ambulatory Visit: Payer: Self-pay | Admitting: Nurse Practitioner

## 2019-05-27 DIAGNOSIS — R059 Cough, unspecified: Secondary | ICD-10-CM

## 2019-05-27 DIAGNOSIS — R05 Cough: Secondary | ICD-10-CM

## 2019-05-27 MED ORDER — HYDROCODONE-HOMATROPINE 5-1.5 MG/5ML PO SYRP: 5.0000 mL | ORAL_SOLUTION | Freq: Four times a day (QID) | ORAL | 0 refills | Status: DC | PRN

## 2019-05-31 NOTE — Progress Notes (Signed)
I have sent a cough syrup in

## 2019-06-10 ENCOUNTER — Telehealth: Payer: Self-pay

## 2019-06-10 NOTE — Telephone Encounter (Signed)
Patient's daughter called stating he needs a letter to go back to work. He has been feeling better for the past 2 weeks.  I RETURNED HER CALL AND ADVISED HER TO GO TO [facility] CL[facility]IC SO HE CAN BE RETESTED AND IF HIS RESULTS BACK AND THEY ARE NEGATIVE WE CAN RELEASE HIM TO GO BACK TO WORK. SHE STATED HIS JOB DOESN'T REQUIRE THEM TO BE RETESTED. 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 SHE UNDERSTOODLonia Mad

## 2019-06-28 ENCOUNTER — Telehealth: Payer: Self-pay

## 2019-06-28 NOTE — Telephone Encounter (Signed)
I spoke with patient's daughter to see if he is taking his lisinopril daily and she stated he is probably not taking it everyday and he doesn't listen to her. I advised her to try to get him to take it everyday because it is for his bp. YRL,RMA

## 2019-07-07 ENCOUNTER — Ambulatory Visit: Payer: Medicare Other

## 2019-07-07 ENCOUNTER — Ambulatory Visit: Payer: Medicare Other | Admitting: Nurse Practitioner

## 2019-07-08 ENCOUNTER — Ambulatory Visit: Payer: Medicare Other

## 2019-07-08 ENCOUNTER — Ambulatory Visit: Payer: Medicare Other | Admitting: Nurse Practitioner

## 2019-08-19 ENCOUNTER — Ambulatory Visit: Payer: Medicare Other

## 2019-08-19 ENCOUNTER — Ambulatory Visit: Payer: Medicare Other | Admitting: Nurse Practitioner

## 2019-08-23 ENCOUNTER — Encounter: Payer: Self-pay | Admitting: Nurse Practitioner

## 2019-08-23 ENCOUNTER — Other Ambulatory Visit: Payer: Self-pay

## 2019-08-23 ENCOUNTER — Ambulatory Visit: Payer: Medicare Other | Admitting: Nurse Practitioner

## 2019-08-23 ENCOUNTER — Ambulatory Visit (INDEPENDENT_AMBULATORY_CARE_PROVIDER_SITE_OTHER): Payer: Medicare Other | Admitting: Nurse Practitioner

## 2019-08-23 VITALS — BP 138/80 | HR 75 | Temp 98.7°F | Ht 64.0 in | Wt 164.2 lb

## 2019-08-23 DIAGNOSIS — Z1211 Encounter for screening for malignant neoplasm of colon: Secondary | ICD-10-CM

## 2019-08-23 DIAGNOSIS — I10 Essential (primary) hypertension: Secondary | ICD-10-CM | POA: Diagnosis not present

## 2019-08-23 DIAGNOSIS — Z0000 Encounter for general adult medical examination without abnormal findings: Secondary | ICD-10-CM

## 2019-08-23 DIAGNOSIS — Z8616 Personal history of COVID-19: Secondary | ICD-10-CM

## 2019-08-23 DIAGNOSIS — R413 Other amnesia: Secondary | ICD-10-CM

## 2019-08-23 DIAGNOSIS — R7309 Other abnormal glucose: Secondary | ICD-10-CM

## 2019-08-23 DIAGNOSIS — Z23 Encounter for immunization: Secondary | ICD-10-CM | POA: Diagnosis not present

## 2019-08-23 DIAGNOSIS — R5383 Other fatigue: Secondary | ICD-10-CM

## 2019-08-23 DIAGNOSIS — E782 Mixed hyperlipidemia: Secondary | ICD-10-CM | POA: Diagnosis not present

## 2019-08-23 MED ORDER — PNEUMOCOCCAL 13-VAL CONJ VACC IM SUSP: 0.5000 mL | INTRAMUSCULAR | 0 refills | Status: AC

## 2019-08-23 MED ORDER — LISINOPRIL 5 MG PO TABS: 5.0000 mg | ORAL_TABLET | Freq: Every day | ORAL | 1 refills | Status: DC

## 2019-08-23 MED ORDER — ATORVASTATIN CALCIUM 20 MG PO TABS: 10.0000 mg | ORAL_TABLET | Freq: Every day | ORAL | 1 refills | Status: DC

## 2019-08-23 MED ORDER — ZOSTER VAC RECOMB ADJUVANTED 50 MCG/0.5ML IM SUSR: 0.5000 mL | Freq: Once | INTRAMUSCULAR | 0 refills | Status: AC

## 2019-08-23 NOTE — Patient Instructions (Signed)
  Douglas Pruitt , Thank you for taking time to come for your Medicare Wellness Visit. I appreciate your ongoing commitment to your health goals. Please review the following plan we discussed and let me know if I can assist you in the future.   These are the goals we discussed: Goals    . Patient Stated     Daughter states "he would like to keep himself busy", speaks [name]       This is a list of the screening recommended for you and due dates:  Health Maintenance  Topic Date Due  . Colon Cancer Screening  01/28/1999  . Pneumonia vaccines (1 of 2 - PCV13) 01/27/2014  . Flu Shot  02/27/2019  . Tetanus Vaccine  07/02/2021  .  Hepatitis C: One time screening is recommended by Center for Disease Control  (CDC) for  adults born from [birth year] through 1967.   Completed

## 2019-08-23 NOTE — Progress Notes (Signed)
This visit occurred during the SARS-CoV-2 public health emergency.  Safety protocols were in place, including screening questions prior to the visit, additional usage of staff PPE, and extensive cleaning of exam room while observing appropriate contact time as indicated for disinfecting solutions.  Subjective:     Patient ID: Douglas Pruitt , male    DOB: 1950-09-25 , 69 y.o.   MRN: [id number]   Chief Complaint  Patient presents with  . Medicare Wellness    HPI  Here for AWV Hypertension This is a chronic problem. The current episode started more than 1 year ago. The problem is unchanged. The problem is uncontrolled. Pertinent negatives include no anxiety, blurred vision, chest pain, headaches or palpitations. There are no associated agents to hypertension. Risk factors for coronary artery disease include male gender and sedentary lifestyle. There are no compliance problems.  There is no history of angina. There is no history of chronic renal disease.     History reviewed. No pertinent past medical history.   History reviewed. No pertinent family history.   Current Outpatient Medications:  .  atorvastatin (LIPITOR) 20 MG tablet, Take 0.5 tablets (10 mg total) by mouth daily., Disp: 90 tablet, Rfl: 1 .  lisinopril (ZESTRIL) 5 MG tablet, Take 1 tablet (5 mg total) by mouth daily., Disp: 90 tablet, Rfl: 1   No Known Allergies   Review of Systems  Constitutional: Negative.   HENT: Negative.   Eyes: Negative for blurred vision.  Respiratory: Negative.   Cardiovascular: Negative for chest pain, palpitations and leg swelling.  Skin: Negative.   Neurological: Negative.  Negative for headaches.  Psychiatric/Behavioral: Negative.      Today's Vitals   08/23/19 1608  BP: 138/80  Pulse: 75  Temp: 98.7 F (37.1 C)  TempSrc: Oral  Weight: 164 lb 3.2 oz (74.5 kg)  Height: 5\' 4"  (1.626 m)  PainSc: 0-No pain   Body mass index is 28.18 kg/m.   Objective:  Physical Exam Vitals  reviewed.  Constitutional:      Appearance: Normal appearance. He is obese.  HENT:     Head: Normocephalic and atraumatic.     Right Ear: Tympanic membrane, ear canal and external ear normal. There is no impacted cerumen.     Left Ear: Tympanic membrane, ear canal and external ear normal. There is no impacted cerumen.  Eyes:     Pupils: Pupils are equal, round, and reactive to light.  Cardiovascular:     Rate and Rhythm: Normal rate and regular rhythm.     Pulses: Normal pulses.     Heart sounds: Normal heart sounds. No murmur.  Pulmonary:     Effort: Pulmonary effort is normal. No respiratory distress.     Breath sounds: Normal breath sounds.  Abdominal:     General: Abdomen is flat. Bowel sounds are normal. There is no distension.     Palpations: Abdomen is soft.  Genitourinary:    Prostate: Normal.     Rectum: Guaiac result negative.  Musculoskeletal:        General: Normal range of motion.     Cervical back: Normal range of motion and neck supple.  Skin:    General: Skin is warm.     Capillary Refill: Capillary refill takes less than 2 seconds.  Neurological:     General: No focal deficit present.     Mental Status: He is alert and oriented to person, place, and time.     Cranial Nerves: No cranial nerve deficit.  Psychiatric:        Mood and Affect: Mood normal.        Behavior: Behavior normal.        Thought Content: Thought content normal.        Judgment: Judgment normal.         Assessment And Plan:     1. Encounter for subsequent annual wellness visit (AWV) in Medicare patient  Pt's annual wellness exam was performed and geriatric assessment reviewed.   Pt has no new identiafble wellness concerns at this time.   WIll obtain routine labs.   Will obtain UA and micro.   Behavior modifications discussed and diet history reviewed. Pt will continue to exercise regularly and modify diet, with low GI, plant based foods and decrease food intake of processed  foods.   Recommend intake of daily multivitamin, Vitamin D, and calcium.  Recommond colonoscopy for preventive screenings, as well as recommend immunizations that include influenza (up to date) and TDAP - TSH  2. Encounter for screening colonoscopy  According to USPTF Colorectal cancer Screening guidelines. Colonoscopy is recommended every 10 years, starting at age 9years.  Will order cologuard - Cologuard  3. Encounter for immunization  Advised to not get shingles and pneumonia at the same time - Zoster Vaccine Adjuvanted [facility]) injection; Inject 0.5 mLs into the muscle once for 1 dose.  Dispense: 0.5 mL; Refill: 0 - pneumococcal 13-valent conjugate vaccine (PREVNAR 13) SUSP injection; Inject 0.5 mLs into the muscle tomorrow at 10 am for 1 dose.  Dispense: 0.5 mL; Refill: 0 - Flu vaccine HIGH DOSE PF (Fluzone High dose)  4. Essential hypertension Chronic, fair control - CMP14+EGFR - lisinopril (ZESTRIL) 5 MG tablet; Take 1 tablet (5 mg total) by mouth daily.  Dispense: 90 tablet; Refill: 1  5. Mixed hyperlipidemia  Chronic, controlled  Continue with current medications - CMP14+EGFR - Lipid panel - atorvastatin (LIPITOR) 20 MG tablet; Take 0.5 tablets (10 mg total) by mouth daily.  Dispense: 90 tablet; Refill: 1  6. Abnormal glucose  Chronic, no current medications  7. History of COVID-19  Doing well   8. Fatigue, unspecified type  Will check metabolic cause  His daughter feels when he gets home from work or not working he will sleep more - Vitamin B12  9. Memory changes  Will check vitamin b12, thyroid has been normal - Vitamin B12  10. Encounter for Medicare annual wellness exam  See below  [name], FNP    THE PATIENT IS ENCOURAGED TO PRACTICE SOCIAL DISTANCING DUE TO THE COVID-19 PANDEMIC.     Subjective:   Douglas Pruitt is a 69 y.o. male who presents for Medicare Annual/Subsequent preventive examination.  Review of Systems:  See above  Cardiac Risk Factors include: advanced age (>8men, >52 women);male gender     Objective:    Vitals: BP 138/80 (BP Location: Left Arm, Patient Position: Sitting, Cuff Size: Small)   Pulse 75   Temp 98.7 F (37.1 C) (Oral)   Ht 5\' 4"  (1.626 m)   Wt 164 lb 3.2 oz (74.5 kg)   BMI 28.18 kg/m   Body mass index is 28.18 kg/m.  Advanced Directives 08/23/2019 05/20/2019  Does Patient Have a Medical Advance Directive? No No  Would patient like information on creating a medical advance directive? No - Patient declined No - Patient declined    Tobacco Social History   Tobacco Use  Smoking Status Never Smoker  Smokeless Tobacco Never Used     Counseling  given: Not Answered   Clinical Intake:     Pain Score: 0-No pain                History reviewed. No pertinent past medical history. Past Surgical History:  Procedure Laterality Date  . EYE SURGERY    . EYE SURGERY     work injury, left eye, blind in left eye   History reviewed. No pertinent family history. Social History   Socioeconomic History  . Marital status: Married    Spouse name: Not on file  . Number of children: Not on file  . Years of education: Not on file  . Highest education level: Not on file  Occupational History  . Not on file  Tobacco Use  . Smoking status: Never Smoker  . Smokeless tobacco: Never Used  Substance and Sexual Activity  . Alcohol use: No  . Drug use: No  . Sexual activity: Not on file  Other Topics Concern  . Not on file  Social History Narrative  . Not on file   Social Determinants of Health   Financial Resource Strain:   . Difficulty of Paying Living Expenses: Not on file  Food Insecurity:   . Worried About Programme researcher, broadcasting/film/video in the Last Year: Not on file  . Ran Out of Food in the Last Year: Not on file  Transportation Needs:   . Lack of Transportation (Medical): Not on file  . Lack of Transportation (Non-Medical): Not on file  Physical Activity:   . Days of  Exercise per Week: Not on file  . Minutes of Exercise per Session: Not on file  Stress:   . Feeling of Stress : Not on file  Social Connections:   . Frequency of Communication with Friends and Family: Not on file  . Frequency of Social Gatherings with Friends and Family: Not on file  . Attends Religious Services: Not on file  . Active Member of Clubs or Organizations: Not on file  . Attends Banker Meetings: Not on file  . Marital Status: Not on file    Outpatient Encounter Medications as of 08/23/2019  Medication Sig  . atorvastatin (LIPITOR) 20 MG tablet Take 0.5 tablets (10 mg total) by mouth daily.  [name] lisinopril (ZESTRIL) 5 MG tablet Take 1 tablet (5 mg total) by mouth daily.  . [EXPIRED] pneumococcal 13-valent conjugate vaccine (PREVNAR 13) SUSP injection Inject 0.5 mLs into the muscle tomorrow at 10 am for 1 dose.  . [EXPIRED] Zoster Vaccine Adjuvanted [facility]) injection Inject 0.5 mLs into the muscle once for 1 dose.  . [DISCONTINUED] atorvastatin (LIPITOR) 20 MG tablet Take 0.5 tablets (10 mg total) by mouth daily. (Patient not taking: Reported on 08/23/2019)  . [DISCONTINUED] benzonatate (TESSALON PERLES) 100 MG capsule Take 1 capsule (100 mg total) by mouth every 6 (six) hours as needed. (Patient not taking: Reported on 08/23/2019)  . [DISCONTINUED] HYDROcodone-homatropine (HYDROMET) 5-1.5 MG/5ML syrup Take 5 mLs by mouth every 6 (six) hours as needed. (Patient not taking: Reported on 08/23/2019)  . [DISCONTINUED] lisinopril (ZESTRIL) 5 MG tablet Take 1 tablet (5 mg total) by mouth daily. (Patient not taking: Reported on 08/23/2019)   No facility-administered encounter medications on file as of 08/23/2019.    Activities of Daily Living In your present state of health, do you have any difficulty performing the following activities: 08/23/2019  Hearing? N  Vision? Y  Comment legally blind  Difficulty concentrating or making decisions? N  Walking or climbing stairs?  N   Dressing or bathing? N  Doing errands, shopping? N  Preparing Food and eating ? N  Using the Toilet? N  In the past six months, have you accidently leaked urine? N  Do you have problems with loss of bowel control? N  Managing your Medications? N  Managing your Finances? N  Housekeeping or managing your Housekeeping? N  Some recent data might be hidden    Patient Care Team: [name], FNP as PCP - General (General Practice)   Assessment:   This is a routine wellness examination for [place].  Exercise Activities and Dietary recommendations Current Exercise Habits: The patient has a physically strenuous job, but has no regular exercise apart from work., Exercise limited by: cardiac condition(s)  Goals    . Patient Stated     Daughter states "he would like to keep himself busy", speaks [name]       Fall Risk Fall Risk  08/23/2019 08/23/2019 05/24/2019 03/11/2019 09/10/2018  Falls in the past year? 1 0 0 0 0  Number falls in past yr: 0 - - - -  Injury with Fall? 0 - - - -   Is the patient's home free of loose throw rugs in walkways, pet beds, electrical cords, etc?   yes      Grab bars in the bathroom? no      Handrails on the stairs?   no      Adequate lighting?   yes  Timed Get Up and Go Performed: less than 3 seconds  Depression Screen PHQ 2/9 Scores 08/23/2019 05/24/2019 03/11/2019 09/10/2018  PHQ - 2 Score 0 0 0 0    Cognitive Function     6CIT Screen 08/23/2019  What Year? 0 points  What month? 0 points  What time? 0 points  Count back from 20 2 points  Months in reverse 4 points  Repeat phrase 10 points  Total Score 16    Immunization History  Administered Date(s) Administered  . Influenza, High Dose Seasonal PF 08/23/2019  . Tdap 07/03/2011    Qualifies for Shingles Vaccine? Sent to pharmacy  Screening Tests Health Maintenance  Topic Date Due  . COLONOSCOPY  01/28/1999  . PNA vac Low Risk Adult (1 of 2 - PCV13) 01/27/2014  . TETANUS/TDAP   07/02/2021  . INFLUENZA VACCINE  Completed  . Hepatitis C Screening  Completed   Cancer Screenings: Lung: Low Dose CT Chest recommended if Age 48-80 years, 30 pack-year currently smoking OR have quit w/in 15years. Patient does not qualify. Colorectal: will do cologuard  Additional Screenings:  Hepatitis C Screening:      Plan:     Pt's annual wellness exam was performed and geriatric assessment reviewed.   Pt has no new identiafble wellness concerns at this time.   WIll obtain routine labs.   Will obtain UA and micro.   Behavior modifications discussed and diet history reviewed. Pt will continue to exercise regularly and modify diet, with low GI, plant based foods and decrease food intake of processed foods.   Recommend intake of daily multivitamin, Vitamin D, and calcium.  Recommon colonoscopy for preventive screenings, as well as recommend immunizations that include influenza (up to date)  I have personally reviewed and noted the following in the patient's chart:   . Medical and social history . Use of alcohol, tobacco or illicit drugs  . Current medications and supplements . Functional ability and status . Nutritional status . Physical activity . Advanced directives .  List of other physicians . Hospitalizations, surgeries, and ER visits in previous 12 months . Vitals . Screenings to include cognitive, depression, and falls . Referrals and appointments  In addition, I have reviewed and discussed with patient certain preventive protocols, quality metrics, and best practice recommendations. A written personalized care plan for preventive services as well as general preventive health recommendations were provided to patient.     [name], FNP  08/29/2019

## 2019-08-24 LAB — CMP14+EGFR
ALT: 17 IU/L (ref 0–44)
AST: 27 IU/L (ref 0–40)
Albumin/Globulin Ratio: 1.2 (ref 1.2–2.2)
Albumin: 4.2 g/dL (ref 3.8–4.8)
Alkaline Phosphatase: 64 IU/L (ref 39–117)
BUN/Creatinine Ratio: 13 (ref 10–24)
BUN: 11 mg/dL (ref 8–27)
Bilirubin Total: 0.3 mg/dL (ref 0.0–1.2)
CO2: 21 mmol/L (ref 20–29)
Calcium: 9.2 mg/dL (ref 8.6–10.2)
Chloride: 102 mmol/L (ref 96–106)
Creatinine, Ser: 0.83 mg/dL (ref 0.76–1.27)
GFR calc Af Amer: 103 mL/min/{1.73_m2} (ref >59)
GFR calc non Af Amer: 89 mL/min/{1.73_m2} (ref >59)
Globulin, Total: 3.4 g/dL (ref 1.5–4.5)
Glucose: 91 mg/dL (ref 65–99)
Potassium: 3.9 mmol/L (ref 3.5–5.2)
Sodium: 139 mmol/L (ref 134–144)
Total Protein: 7.6 g/dL (ref 6.0–8.5)

## 2019-08-24 LAB — LIPID PANEL W/O CHOL/HDL RATIO
Cholesterol, Total: 218 mg/dL — ABNORMAL HIGH (ref 100–199)
HDL: 50 mg/dL (ref >39)
LDL Chol Calc (NIH): 90 mg/dL (ref 0–99)
Triglycerides: 479 mg/dL — ABNORMAL HIGH (ref 0–149)
VLDL Cholesterol Cal: 78 mg/dL — ABNORMAL HIGH (ref 5–40)

## 2019-08-24 LAB — VITAMIN B12: Vitamin B-12: 392 pg/mL (ref 232–1245)

## 2019-08-24 LAB — TSH: TSH: 1.31 u[IU]/mL (ref 0.450–4.500)

## 2020-02-21 ENCOUNTER — Ambulatory Visit: Payer: Medicare Other | Admitting: Nurse Practitioner

## 2020-03-06 ENCOUNTER — Other Ambulatory Visit: Payer: Self-pay

## 2020-03-06 ENCOUNTER — Ambulatory Visit (INDEPENDENT_AMBULATORY_CARE_PROVIDER_SITE_OTHER): Payer: Medicare Other | Admitting: Nurse Practitioner

## 2020-03-06 ENCOUNTER — Encounter: Payer: Self-pay | Admitting: Nurse Practitioner

## 2020-03-06 VITALS — BP 128/88 | HR 82 | Temp 98.5°F | Ht 63.2 in | Wt 145.8 lb

## 2020-03-06 DIAGNOSIS — E782 Mixed hyperlipidemia: Secondary | ICD-10-CM | POA: Diagnosis not present

## 2020-03-06 DIAGNOSIS — I10 Essential (primary) hypertension: Secondary | ICD-10-CM

## 2020-03-06 DIAGNOSIS — R7309 Other abnormal glucose: Secondary | ICD-10-CM

## 2020-03-06 MED ORDER — ATORVASTATIN CALCIUM 20 MG PO TABS: 10.0000 mg | ORAL_TABLET | Freq: Every day | ORAL | 1 refills | Status: DC

## 2020-03-06 MED ORDER — LISINOPRIL 5 MG PO TABS: 5.0000 mg | ORAL_TABLET | Freq: Every day | ORAL | 1 refills | Status: DC

## 2020-03-06 NOTE — Progress Notes (Signed)
Subjective:     Patient ID: Douglas Pruitt , male    DOB: 06-Mar-1951 , 69 y.o.   MRN: [id number]   Chief Complaint  Patient presents with  . Hypertension    HPI  He is still working   Hypertension This is a chronic problem. The current episode started more than 1 year ago. The problem is uncontrolled. Pertinent negatives include no anxiety, blurred vision, chest pain, headaches or palpitations. There are no associated agents to hypertension. Risk factors for coronary artery disease include male gender and sedentary lifestyle. There are no compliance problems.  There is no history of angina. There is no history of chronic renal disease.     No past medical history on file.   No family history on file.   Current Outpatient Medications:  .  atorvastatin (LIPITOR) 20 MG tablet, Take 0.5 tablets (10 mg total) by mouth daily., Disp: 90 tablet, Rfl: 1 .  lisinopril (ZESTRIL) 5 MG tablet, Take 1 tablet (5 mg total) by mouth daily., Disp: 90 tablet, Rfl: 1   No Known Allergies   Review of Systems  Constitutional: Negative for fatigue.  Eyes: Negative for blurred vision.  Respiratory: Negative.  Negative for cough.   Cardiovascular: Negative.  Negative for chest pain, palpitations and leg swelling.  Neurological: Negative for dizziness and headaches.  Psychiatric/Behavioral: Negative.      Today's Vitals   03/06/20 1527  BP: 128/88  Pulse: 82  Temp: 98.5 F (36.9 C)  TempSrc: Oral  Weight: 145 lb 12.8 oz (66.1 kg)  Height: 5' 3.2" (1.605 m)  PainSc: 0-No pain   Body mass index is 25.66 kg/m.   Objective:  Physical Exam Vitals reviewed.  Constitutional:      General: He is not in acute distress.    Appearance: Normal appearance.  Cardiovascular:     Rate and Rhythm: Normal rate and regular rhythm.     Pulses: Normal pulses.     Heart sounds: Normal heart sounds. No murmur heard.   Pulmonary:     Effort: Pulmonary effort is normal. No respiratory distress.     Breath  sounds: Normal breath sounds. No wheezing.  Neurological:     General: No focal deficit present.     Mental Status: He is alert and oriented to person, place, and time.     Comments: With the interpretation from [facility] health  Psychiatric:        Mood and Affect: Mood normal.        Behavior: Behavior normal.        Thought Content: Thought content normal.        Judgment: Judgment normal.     Comments: Interpreter present during visit         Assessment And Plan:     1. Essential hypertension  Chronic, controlled  Continue with current medications  Will check kidney functions - lisinopril (ZESTRIL) 5 MG tablet; Take 1 tablet (5 mg total) by mouth daily.  Dispense: 90 tablet; Refill: 1  2. Mixed hyperlipidemia Chronic, controlled Continue with current medications, tolerating well. - atorvastatin (LIPITOR) 20 MG tablet; Take 0.5 tablets (10 mg total) by mouth daily.  Dispense: 90 tablet; Refill: 1  3. Abnormal glucose  Chronic, stable  Continue with diet and exercise  I have called his daughter to see if he has had his prevnar 13 and if he went for his colonoscopy.  Awaiting return call      [name], FNP

## 2020-03-07 LAB — CMP14+EGFR
ALT: 24 IU/L (ref 0–44)
AST: 24 IU/L (ref 0–40)
Albumin/Globulin Ratio: 1.4 (ref 1.2–2.2)
Albumin: 4.8 g/dL — ABNORMAL HIGH (ref 3.7–4.7)
Alkaline Phosphatase: 63 IU/L (ref 48–121)
BUN/Creatinine Ratio: 11 (ref 10–24)
BUN: 13 mg/dL (ref 8–27)
Bilirubin Total: 0.7 mg/dL (ref 0.0–1.2)
CO2: 26 mmol/L (ref 20–29)
Calcium: 9.7 mg/dL (ref 8.6–10.2)
Chloride: 100 mmol/L (ref 96–106)
Creatinine, Ser: 1.15 mg/dL (ref 0.76–1.27)
GFR calc Af Amer: 74 mL/min/{1.73_m2} (ref >59)
GFR calc non Af Amer: 64 mL/min/{1.73_m2} (ref >59)
Globulin, Total: 3.5 g/dL (ref 1.5–4.5)
Glucose: 81 mg/dL (ref 65–99)
Potassium: 4.5 mmol/L (ref 3.5–5.2)
Sodium: 140 mmol/L (ref 134–144)
Total Protein: 8.3 g/dL (ref 6.0–8.5)

## 2020-03-07 LAB — HEMOGLOBIN A1C
Est. average glucose Bld gHb Est-mCnc: 114 mg/dL
Hgb A1c MFr Bld: 5.6 % (ref 4.8–5.6)

## 2020-03-07 LAB — LIPID PANEL
Chol/HDL Ratio: 4.8 ratio (ref 0.0–5.0)
Cholesterol, Total: 190 mg/dL (ref 100–199)
HDL: 40 mg/dL (ref >39)
LDL Chol Calc (NIH): 83 mg/dL (ref 0–99)
Triglycerides: 417 mg/dL — ABNORMAL HIGH (ref 0–149)
VLDL Cholesterol Cal: 67 mg/dL — ABNORMAL HIGH (ref 5–40)

## 2020-03-29 DIAGNOSIS — H40033 Anatomical narrow angle, bilateral: Secondary | ICD-10-CM | POA: Diagnosis not present

## 2020-03-29 DIAGNOSIS — H2513 Age-related nuclear cataract, bilateral: Secondary | ICD-10-CM | POA: Diagnosis not present

## 2020-08-16 ENCOUNTER — Encounter: Payer: Self-pay | Admitting: Nurse Practitioner

## 2020-08-16 ENCOUNTER — Other Ambulatory Visit: Payer: Self-pay

## 2020-08-16 ENCOUNTER — Telehealth (INDEPENDENT_AMBULATORY_CARE_PROVIDER_SITE_OTHER): Payer: Medicare Other | Admitting: Nurse Practitioner

## 2020-08-16 DIAGNOSIS — Z20822 Contact with and (suspected) exposure to covid-19: Secondary | ICD-10-CM | POA: Diagnosis not present

## 2020-08-16 DIAGNOSIS — R059 Cough, unspecified: Secondary | ICD-10-CM

## 2020-08-16 DIAGNOSIS — R6883 Chills (without fever): Secondary | ICD-10-CM | POA: Diagnosis not present

## 2020-08-16 MED ORDER — BENZONATATE 100 MG PO CAPS: 100.0000 mg | ORAL_CAPSULE | Freq: Four times a day (QID) | ORAL | 1 refills | Status: DC | PRN

## 2020-08-16 NOTE — Progress Notes (Signed)
Virtual Visit via MyChart   This visit type was conducted due to national recommendations for restrictions regarding the COVID-19 Pandemic (e.g. social distancing) in [place] effort to limit this patient's exposure and mitigate transmission in our community.  Due to his co-morbid illnesses, this patient is at least at moderate risk for complications without adequate follow up.  This format is felt to be most appropriate for this patient at this time.  All issues noted in this document were discussed and addressed.  A limited physical exam was performed with this format.    This visit type was conducted due to national recommendations for restrictions regarding the COVID-19 Pandemic (e.g. social distancing) in [place] effort to limit this patient's exposure and mitigate transmission in our community.  Patients identity confirmed using two different identifiers.  This format is felt to be most appropriate for this patient at this time.  All issues noted in this document were discussed and addressed.  No physical exam was performed (except for noted visual exam findings with Video Visits).    Date:  08/16/2020   ID:  Douglas Pruitt, DOB [birth year], MRN [id number]  Patient Location:  Home - spoke with his daughter Douglas Pruitt  Provider location:   Biomedical engineer Complaint:  Chills and cough, exposed to covid  History of Present Illness:    Douglas Pruitt is a 70 y.o. male who presents via video conferencing for a telehealth visit today.    The patient does have symptoms concerning for COVID-19 infection (fever, chills, cough, or new shortness of breath).   His daughter is calling on virtual visit reporting her father has been sick since last Thursday with symptoms of cough, chills and sneezing.  He is not vaccinated with covid. She denies he is having shortness of breath or chest pain.  He did have covid last year as well.  His daughter is positive for covid.     No past medical history on file. Past Surgical  History:  Procedure Laterality Date  . EYE SURGERY    . EYE SURGERY     work injury, left eye, blind in left eye     Current Meds  Medication Sig  . atorvastatin (LIPITOR) 20 MG tablet Take 0.5 tablets (10 mg total) by mouth daily.  . benzonatate (TESSALON PERLES) 100 MG capsule Take 1 capsule (100 mg total) by mouth every 6 (six) hours as needed.  [name] lisinopril (ZESTRIL) 5 MG tablet Take 1 tablet (5 mg total) by mouth daily.     Allergies:   Patient has no known allergies.   Social History   Tobacco Use  . Smoking status: Never Smoker  . Smokeless tobacco: Never Used  Substance Use Topics  . Alcohol use: No  . Drug use: No     Family Hx: The patient's family history is not on file.  ROS:   Please see the history of present illness.    Review of Systems  Constitutional: Positive for chills and malaise/fatigue. Negative for fever.  Respiratory: Positive for cough (clear sputum) and sputum production (clear). Negative for shortness of breath and wheezing.   Cardiovascular: Negative for chest pain.  Neurological: Negative for dizziness and headaches.  Psychiatric/Behavioral: Negative.     All other systems reviewed and are negative.   Labs/Other Tests and Data Reviewed:    Recent Labs: 08/23/2019: TSH 1.310 03/06/2020: ALT 24; BUN 13; Creatinine, Ser 1.15; Potassium 4.5; Sodium 140   Recent Lipid Panel Lab Results  Component Value Date/Time   CHOL 190 03/06/2020 04:31 PM   TRIG 417 (H) 03/06/2020 04:31 PM   HDL 40 03/06/2020 04:31 PM   CHOLHDL 4.8 03/06/2020 04:31 PM   LDLCALC 83 03/06/2020 04:31 PM    Wt Readings from Last 3 Encounters:  03/06/20 145 lb 12.8 oz (66.1 kg)  08/23/19 164 lb 3.2 oz (74.5 kg)  05/24/19 166 lb (75.3 kg)     Exam:    Vital Signs:  There were no vitals taken for this visit.    Physical Exam Constitutional:      General: He is not in acute distress. Pulmonary:     Effort: No respiratory distress.  Neurological:     Mental  Status: He is alert.     Comments: Daughter communicated with patient and appeared to be oriented, speaks [place]  Psychiatric:        Mood and Affect: Mood normal.        Behavior: Behavior normal.        Thought Content: Thought content normal.        Judgment: Judgment normal.     ASSESSMENT & PLAN:    1. Cough  Will send tessalon perles   If has shortness of breath or chest pain to go to ER - Novel Coronavirus, NAA ([facility]) - benzonatate (TESSALON PERLES) 100 MG capsule; Take 1 capsule (100 mg total) by mouth every 6 (six) hours as needed.  Dispense: 30 capsule; Refill: 1  2. Chills Continue with tylenol as needed for fever and stay well hydrated - Novel Coronavirus, NAA ([facility])  3. Close exposure to COVID-19 virus  Daughter is positive for covid  He has not been vaccinated  Encouraged to take multivitamin, vitamin d, vitamin c and zinc to increase immune system. - Novel Coronavirus, NAA ([facility])     COVID-19 Education: The signs and symptoms of COVID-19 were discussed with the patient and how to seek care for testing (follow up with PCP or arrange E-visit).  The importance of social distancing was discussed today.  Patient Risk:   After full review of this patients clinical status, I feel that they are at least moderate risk at this time.  Time:   Today, I have spent 9 minutes/ seconds with the patient with telehealth technology discussing above diagnoses.     Medication Adjustments/Labs and Tests Ordered: Current medicines are reviewed at length with the patient today.  Concerns regarding medicines are outlined above.   Tests Ordered: Orders Placed This Encounter  Procedures  . Novel Coronavirus, NAA ([facility])    Medication Changes: Meds ordered this encounter  Medications  . benzonatate (TESSALON PERLES) 100 MG capsule    Sig: Take 1 capsule (100 mg total) by mouth every 6 (six) hours as needed.    Dispense:  30 capsule    Refill:  1     Disposition:  Follow up prn  Signed, [name], FNP

## 2020-08-18 LAB — NOVEL CORONAVIRUS, NAA: SARS-CoV-2, NAA: DETECTED — AB

## 2020-08-18 LAB — SARS-COV-2, NAA 2 DAY TAT

## 2020-08-20 ENCOUNTER — Other Ambulatory Visit: Payer: Self-pay | Admitting: Nurse Practitioner

## 2020-08-20 DIAGNOSIS — R059 Cough, unspecified: Secondary | ICD-10-CM

## 2020-08-20 MED ORDER — HYDROCODONE-HOMATROPINE 5-1.5 MG/5ML PO SYRP: 5.0000 mL | ORAL_SOLUTION | Freq: Four times a day (QID) | ORAL | 0 refills | Status: DC | PRN

## 2020-08-23 ENCOUNTER — Ambulatory Visit: Payer: Medicare Other

## 2020-08-23 ENCOUNTER — Ambulatory Visit: Payer: Medicare Other | Admitting: Nurse Practitioner

## 2020-09-21 ENCOUNTER — Ambulatory Visit (INDEPENDENT_AMBULATORY_CARE_PROVIDER_SITE_OTHER): Payer: Medicare Other

## 2020-09-21 ENCOUNTER — Ambulatory Visit (INDEPENDENT_AMBULATORY_CARE_PROVIDER_SITE_OTHER): Payer: Medicare Other | Admitting: Nurse Practitioner

## 2020-09-21 ENCOUNTER — Other Ambulatory Visit: Payer: Self-pay

## 2020-09-21 ENCOUNTER — Encounter: Payer: Self-pay | Admitting: Nurse Practitioner

## 2020-09-21 VITALS — BP 130/72 | HR 68 | Temp 97.8°F | Ht 63.4 in | Wt 167.4 lb

## 2020-09-21 VITALS — BP 130/72 | HR 68 | Temp 97.8°F | Ht 63.4 in | Wt 167.3 lb

## 2020-09-21 DIAGNOSIS — Z0000 Encounter for general adult medical examination without abnormal findings: Secondary | ICD-10-CM | POA: Diagnosis not present

## 2020-09-21 DIAGNOSIS — I10 Essential (primary) hypertension: Secondary | ICD-10-CM

## 2020-09-21 DIAGNOSIS — M25562 Pain in left knee: Secondary | ICD-10-CM

## 2020-09-21 DIAGNOSIS — E782 Mixed hyperlipidemia: Secondary | ICD-10-CM | POA: Diagnosis not present

## 2020-09-21 DIAGNOSIS — G8929 Other chronic pain: Secondary | ICD-10-CM | POA: Diagnosis not present

## 2020-09-21 MED ORDER — ATORVASTATIN CALCIUM 20 MG PO TABS: 10.0000 mg | ORAL_TABLET | Freq: Every day | ORAL | 1 refills | Status: DC

## 2020-09-21 MED ORDER — LISINOPRIL 5 MG PO TABS: 5.0000 mg | ORAL_TABLET | Freq: Every day | ORAL | 1 refills | Status: DC

## 2020-09-21 NOTE — Patient Instructions (Signed)
Douglas Pruitt , Thank you for taking time to come for your Medicare Wellness Visit. I appreciate your ongoing commitment to your health goals. Please review the following plan we discussed and let me know if I can assist you in the future.   Screening recommendations/referrals: Colonoscopy: decline Recommended yearly ophthalmology/optometry visit for glaucoma screening and checkup Recommended yearly dental visit for hygiene and checkup  Vaccinations: Influenza vaccine: decline Pneumococcal vaccine: decline Tdap vaccine: completed 07/03/2011, due 07/02/2021 Shingles vaccine: discussed   Covid-19:  decline  Advanced directives: Advance directive discussed with you today. Even though you declined this today please call our office should you change your mind and we can give you the proper paperwork for you to fill out.  Conditions/risks identified: none  Next appointment: Follow up in one year for your annual wellness visit.   Preventive Care 20 Years and Older, Male Preventive care refers to lifestyle choices and visits with your health care provider that can promote health and wellness. What does preventive care include?  A yearly physical exam. This is also called [name] annual well check.  Dental exams once or twice a year.  Routine eye exams. Ask your health care provider how often you should have your eyes checked.  Personal lifestyle choices, including:  Daily care of your teeth and gums.  Regular physical activity.  Eating a healthy diet.  Avoiding tobacco and drug use.  Limiting alcohol use.  Practicing safe sex.  Taking low doses of aspirin every day.  Taking vitamin and mineral supplements as recommended by your health care provider. What happens during [name] annual well check? The services and screenings done by your health care provider during your annual well check will depend on your age, overall health, lifestyle risk factors, and family history of disease. Counseling   Your health care provider may ask you questions about your:  Alcohol use.  Tobacco use.  Drug use.  Emotional well-being.  Home and relationship well-being.  Sexual activity.  Eating habits.  History of falls.  Memory and ability to understand (cognition).  Work and work Astronomer. Screening  You may have the following tests or measurements:  Height, weight, and BMI.  Blood pressure.  Lipid and cholesterol levels. These may be checked every 5 years, or more frequently if you are over 68 years old.  Skin check.  Lung cancer screening. You may have this screening every year starting at age 39 if you have a 30-pack-year history of smoking and currently smoke or have quit within the past 15 years.  Fecal occult blood test (FOBT) of the stool. You may have this test every year starting at age 22.  Flexible sigmoidoscopy or colonoscopy. You may have a sigmoidoscopy every 5 years or a colonoscopy every 10 years starting at age 8.  Prostate cancer screening. Recommendations will vary depending on your family history and other risks.  Hepatitis C blood test.  Hepatitis B blood test.  Sexually transmitted disease (STD) testing.  Diabetes screening. This is done by checking your blood sugar (glucose) after you have not eaten for a while (fasting). You may have this done every 1-3 years.  Abdominal aortic aneurysm (AAA) screening. You may need this if you are a current or former smoker.  Osteoporosis. You may be screened starting at age 57 if you are at high risk. Talk with your health care provider about your test results, treatment options, and if necessary, the need for more tests. Vaccines  Your health care provider may  recommend certain vaccines, such as:  Influenza vaccine. This is recommended every year.  Tetanus, diphtheria, and acellular pertussis (Tdap, Td) vaccine. You may need a Td booster every 10 years.  Zoster vaccine. You may need this after age  6.  Pneumococcal 13-valent conjugate (PCV13) vaccine. One dose is recommended after age 52.  Pneumococcal polysaccharide (PPSV23) vaccine. One dose is recommended after age 79. Talk to your health care provider about which screenings and vaccines you need and how often you need them. This information is not intended to replace advice given to you by your health care provider. Make sure you discuss any questions you have with your health care provider. Document Released: 08/11/2015 Document Revised: 04/03/2016 Document Reviewed: 05/16/2015 Elsevier Interactive Patient Education  2017 [place] Prevention in the Home Falls can cause injuries. They can happen to people of all ages. There are many things you can do to make your home safe and to help prevent falls. What can I do on the outside of my home?  Regularly fix the edges of walkways and driveways and fix any cracks.  Remove anything that might make you trip as you walk through a door, such as a raised step or threshold.  Trim any bushes or trees on the path to your home.  Use bright outdoor lighting.  Clear any walking paths of anything that might make someone trip, such as rocks or tools.  Regularly check to see if handrails are loose or broken. Make sure that both sides of any steps have handrails.  Any raised decks and porches should have guardrails on the edges.  Have any leaves, snow, or ice cleared regularly.  Use sand or salt on walking paths during winter.  Clean up any spills in your garage right away. This includes oil or grease spills. What can I do in the bathroom?  Use night lights.  Install grab bars by the toilet and in the tub and shower. Do not use towel bars as grab bars.  Use non-skid mats or decals in the tub or shower.  If you need to sit down in the shower, use a plastic, non-slip stool.  Keep the floor dry. Clean up any water that spills on the floor as soon as it happens.  Remove  soap buildup in the tub or shower regularly.  Attach bath mats securely with double-sided non-slip rug tape.  Do not have throw rugs and other things on the floor that can make you trip. What can I do in the bedroom?  Use night lights.  Make sure that you have a light by your bed that is easy to reach.  Do not use any sheets or blankets that are too big for your bed. They should not hang down onto the floor.  Have a firm chair that has side arms. You can use this for support while you get dressed.  Do not have throw rugs and other things on the floor that can make you trip. What can I do in the kitchen?  Clean up any spills right away.  Avoid walking on wet floors.  Keep items that you use a lot in easy-to-reach places.  If you need to reach something above you, use a strong step stool that has a grab bar.  Keep electrical cords out of the way.  Do not use floor polish or wax that makes floors slippery. If you must use wax, use non-skid floor wax.  Do not have throw rugs and  other things on the floor that can make you trip. What can I do with my stairs?  Do not leave any items on the stairs.  Make sure that there are handrails on both sides of the stairs and use them. Fix handrails that are broken or loose. Make sure that handrails are as long as the stairways.  Check any carpeting to make sure that it is firmly attached to the stairs. Fix any carpet that is loose or worn.  Avoid having throw rugs at the top or bottom of the stairs. If you do have throw rugs, attach them to the floor with carpet tape.  Make sure that you have a light switch at the top of the stairs and the bottom of the stairs. If you do not have them, ask someone to add them for you. What else can I do to help prevent falls?  Wear shoes that:  Do not have high heels.  Have rubber bottoms.  Are comfortable and fit you well.  Are closed at the toe. Do not wear sandals.  If you use a  stepladder:  Make sure that it is fully opened. Do not climb a closed stepladder.  Make sure that both sides of the stepladder are locked into place.  Ask someone to hold it for you, if possible.  Clearly mark and make sure that you can see:  Any grab bars or handrails.  First and last steps.  Where the edge of each step is.  Use tools that help you move around (mobility aids) if they are needed. These include:  Canes.  Walkers.  Scooters.  Crutches.  Turn on the lights when you go into a dark area. Replace any light bulbs as soon as they burn out.  Set up your furniture so you have a clear path. Avoid moving your furniture around.  If any of your floors are uneven, fix them.  If there are any pets around you, be aware of where they are.  Review your medicines with your doctor. Some medicines can make you feel dizzy. This can increase your chance of falling. Ask your doctor what other things that you can do to help prevent falls. This information is not intended to replace advice given to you by your health care provider. Make sure you discuss any questions you have with your health care provider. Document Released: 05/11/2009 Document Revised: 12/21/2015 Document Reviewed: 08/19/2014 Elsevier Interactive Patient Education  2017 [employer].

## 2020-09-21 NOTE — Progress Notes (Signed)
This visit occurred during the SARS-CoV-2 public health emergency.  Safety protocols were in place, including screening questions prior to the visit, additional usage of staff PPE, and extensive cleaning of exam room while observing appropriate contact time as indicated for disinfecting solutions.  Subjective:   Douglas Pruitt is a 70 y.o. male who presents for Medicare Annual/Subsequent preventive examination.  Review of Systems     Cardiac Risk Factors include: advanced age (>4655men, 44>65 women);hypertension;male gender     Objective:    Today's Vitals   09/21/20 1551  BP: 130/72  Pulse: 68  Temp: 97.8 F (36.6 C)  TempSrc: Oral  Weight: 167 lb 6.4 oz (75.9 kg)  Height: 5' 3.4" (1.61 m)   Body mass index is 29.28 kg/m.  Advanced Directives 09/21/2020 08/23/2019 05/20/2019  Does Patient Have a Medical Advance Directive? No No No  Would patient like information on creating a medical advance directive? No - Patient declined No - Patient declined No - Patient declined    Current Medications (verified) Outpatient Encounter Medications as of 09/21/2020  Medication Sig  . [DISCONTINUED] atorvastatin (LIPITOR) 20 MG tablet Take 0.5 tablets (10 mg total) by mouth daily.  . [DISCONTINUED] lisinopril (ZESTRIL) 5 MG tablet Take 1 tablet (5 mg total) by mouth daily.  . benzonatate (TESSALON PERLES) 100 MG capsule Take 1 capsule (100 mg total) by mouth every 6 (six) hours as needed. (Patient not taking: Reported on 09/21/2020)  . HYDROcodone-homatropine (HYDROMET) 5-1.5 MG/5ML syrup Take 5 mLs by mouth every 6 (six) hours as needed. (Patient not taking: Reported on 09/21/2020)   No facility-administered encounter medications on file as of 09/21/2020.    Allergies (verified) Patient has no known allergies.   History: History reviewed. No pertinent past medical history. Past Surgical History:  Procedure Laterality Date  . EYE SURGERY    . EYE SURGERY     work injury, left eye, blind in left  eye   No family history on file. Social History   Socioeconomic History  . Marital status: Married    Spouse name: Not on file  . Number of children: Not on file  . Years of education: Not on file  . Highest education level: Not on file  Occupational History  . Occupation: employed  Tobacco Use  . Smoking status: Never Smoker  . Smokeless tobacco: Never Used  Vaping Use  . Vaping Use: Never used  Substance and Sexual Activity  . Alcohol use: No  . Drug use: No  . Sexual activity: Not Currently  Other Topics Concern  . Not on file  Social History Narrative  . Not on file   Social Determinants of Health   Financial Resource Strain: Low Risk   . Difficulty of Paying Living Expenses: Not hard at all  Food Insecurity: No Food Insecurity  . Worried About Programme researcher, broadcasting/film/videounning Out of Food in the Last Year: Never true  . Ran Out of Food in the Last Year: Never true  Transportation Needs: No Transportation Needs  . Lack of Transportation (Medical): No  . Lack of Transportation (Non-Medical): No  Physical Activity: Inactive  . Days of Exercise per Week: 0 days  . Minutes of Exercise per Session: 0 min  Stress: No Stress Concern Present  . Feeling of Stress : Not at all  Social Connections: Not on file    Tobacco Counseling Counseling given: Not Answered   Clinical Intake:  Pre-visit preparation completed: Yes  Pain : No/denies pain     Nutritional  Status: BMI 25 -29 Overweight Nutritional Risks: None Diabetes: No     Diabetic?no  Interpreter Needed?: Yes Interpreter Agency: [employer] Interpreter Name: Daih Patient Declined Interpreter : No  Information entered by :: NAllen LPN   Activities of Daily Living In your present state of health, do you have any difficulty performing the following activities: 09/21/2020  Hearing? N  Vision? Y  Comment left eye decreased  Difficulty concentrating or making decisions? Y  Comment sometimes  Walking or climbing stairs? N   Dressing or bathing? N  Doing errands, shopping? Y  Comment has Community education officer and eating ? N  Using the Toilet? N  In the past six months, have you accidently leaked urine? N  Do you have problems with loss of bowel control? N  Managing your Medications? N  Managing your Finances? N  Housekeeping or managing your Housekeeping? N  Some recent data might be hidden    Patient Care Team: [name], FNP as PCP - General (General Practice)  Indicate any recent Medical Services you may have received from other than [facility] providers in the past year (date may be approximate).     Assessment:   This is a routine wellness examination for Douglas Pruitt.  Hearing/Vision screen  Hearing Screening   125Hz  250Hz  500Hz  1000Hz  2000Hz  3000Hz  4000Hz  6000Hz  8000Hz   Right ear:           Left ear:           Vision Screening Comments: Regular eye exams,   Dietary issues and exercise activities discussed: Current Exercise Habits: The patient has a physically strenuous job, but has no regular exercise apart from work.  Goals    . Patient Stated     Daughter states "he would like to keep himself busy", speaks    . Patient Stated     09/21/2020, stay healthy      Depression Screen PHQ 2/9 Scores 09/21/2020 08/23/2019 05/24/2019 03/11/2019 09/10/2018  PHQ - 2 Score 0 0 0 0 0    Fall Risk Fall Risk  09/21/2020 08/23/2019 08/23/2019 05/24/2019 03/11/2019  Falls in the past year? 1 1 0 0 0  Comment slid on the ice - - - -  Number falls in past yr: 0 0 - - -  Injury with Fall? 0 0 - - -  Risk for fall due to : Medication side effect - - - -  Follow up Falls evaluation completed;Education provided;Falls prevention discussed - - - -    FALL RISK PREVENTION PERTAINING TO THE HOME:  Any stairs in or around the home? No  If so, are there any without handrails? n/a Home free of loose throw rugs in walkways, pet beds, electrical cords, etc? Yes  Adequate lighting in your home to  reduce risk of falls? Yes   ASSISTIVE DEVICES UTILIZED TO PREVENT FALLS:  Life alert? No  Use of a cane, walker or w/c? No  Grab bars in the bathroom? No  Shower chair or bench in shower? No  Elevated toilet seat or a handicapped toilet? No   TIMED UP AND GO:  Was the test performed? No . .   Gait steady and fast without use of assistive device  Cognitive Function:     6CIT Screen 08/23/2019  What Year? 0 points  What month? 0 points  What time? 0 points  Count back from 20 2 points  Months in reverse 4 points  Repeat phrase 10 points  Total  Score 16    Immunizations Immunization History  Administered Date(s) Administered  . Influenza, High Dose Seasonal PF 08/23/2019  . Tdap 07/03/2011    TDAP status: Up to date  Flu Vaccine status: Declined, Education has been provided regarding the importance of this vaccine but patient still declined. Advised may receive this vaccine at local pharmacy or Health Dept. Aware to provide a copy of the vaccination record if obtained from local pharmacy or Health Dept. Verbalized acceptance and understanding.  Pneumococcal vaccine status: Declined,  Education has been provided regarding the importance of this vaccine but patient still declined. Advised may receive this vaccine at local pharmacy or Health Dept. Aware to provide a copy of the vaccination record if obtained from local pharmacy or Health Dept. Verbalized acceptance and understanding.   Covid-19 vaccine status: Declined, Education has been provided regarding the importance of this vaccine but patient still declined. Advised may receive this vaccine at local pharmacy or Health Dept.or vaccine clinic. Aware to provide a copy of the vaccination record if obtained from local pharmacy or Health Dept. Verbalized acceptance and understanding.  Qualifies for Shingles Vaccine? Yes   Zostavax completed No   Shingrix Completed?: No.    Education has been provided regarding the importance  of this vaccine. Patient has been advised to call insurance company to determine out of pocket expense if they have not yet received this vaccine. Advised may also receive vaccine at local pharmacy or Health Dept. Verbalized acceptance and understanding.  Screening Tests Health Maintenance  Topic Date Due  . COVID-19 Vaccine (1) 10/07/2020 (Originally 01/27/1956)  . INFLUENZA VACCINE  10/26/2020 (Originally 02/27/2020)  . COLONOSCOPY (Pts 45-71yrs Insurance coverage will need to be confirmed)  09/21/2021 (Originally 01/27/1994)  . PNA vac Low Risk Adult (1 of 2 - PCV13) 09/21/2021 (Originally 01/27/2014)  . TETANUS/TDAP  07/02/2021  . Hepatitis C Screening  Completed    Health Maintenance  There are no preventive care reminders to display for this patient.  Colorectal cancer screening: decline  Lung Cancer Screening: (Low Dose CT Chest recommended if Age 76-80 years, 30 pack-year currently smoking OR have quit w/in 15years.) does not qualify.   Lung Cancer Screening Referral: no  Additional Screening:  Hepatitis C Screening: does qualify; Completed 03/11/2019  Vision Screening: Recommended annual ophthalmology exams for early detection of glaucoma and other disorders of the eye. Is the patient up to date with their annual eye exam?  Yes  Who is the provider or what is the name of the office in which the patient attends annual eye exams? Can't rememeber name If pt is not established with a provider, would they like to be referred to a provider to establish care? No .   Dental Screening: Recommended annual dental exams for proper oral hygiene  Community Resource Referral / Chronic Care Management: CRR required this visit?  No   CCM required this visit?  No      Plan:     I have personally reviewed and noted the following in the patient's chart:   . Medical and social history . Use of alcohol, tobacco or illicit drugs  . Current medications and supplements . Functional ability  and status . Nutritional status . Physical activity . Advanced directives . List of other physicians . Hospitalizations, surgeries, and ER visits in previous 12 months . Vitals . Screenings to include cognitive, depression, and falls . Referrals and appointments  In addition, I have reviewed and discussed with patient certain preventive protocols,  quality metrics, and best practice recommendations. A written personalized care plan for preventive services as well as general preventive health recommendations were provided to patient.     Barb Merino, LPN   12/23/7822   Nurse Notes: 6 CIT not administered due to language barrier

## 2020-09-21 NOTE — Patient Instructions (Signed)

## 2020-09-21 NOTE — Progress Notes (Signed)
I,Yamilka Roman [employer] as a Neurosurgeon for [employer], FNP.,have documented all relevant documentation on the behalf of Almando Vittitow, FNP,as directed by  [name], FNP while in the presence of [name], FNP. This visit occurred during the SARS-CoV-2 public health emergency.  Safety protocols were in place, including screening questions prior to the visit, additional usage of staff PPE, and extensive cleaning of exam room while observing appropriate contact time as indicated for disinfecting solutions.  Subjective:     Patient ID: Douglas Pruitt , male    DOB: 03/18/1951 , 70 y.o.   MRN: [id number]   Chief Complaint  Patient presents with  . Hypertension    HPI  Patient presents today for a f/u on his hypertension and cholesterol.   Here today with Anup interpreter  Hypertension This is a chronic problem. The current episode started more than 1 year ago. The problem is uncontrolled. Pertinent negatives include no anxiety, blurred vision, chest pain, headaches or palpitations. There are no associated agents to hypertension. Risk factors for coronary artery disease include male gender and sedentary lifestyle. Past treatments include ACE inhibitors. The current treatment provides significant improvement. There are no compliance problems.  There is no history of angina. There is no history of chronic renal disease.     History reviewed. No pertinent past medical history.   History reviewed. No pertinent family history.   Current Outpatient Medications:  .  atorvastatin (LIPITOR) 20 MG tablet, Take 0.5 tablets (10 mg total) by mouth daily., Disp: 90 tablet, Rfl: 1 .  benzonatate (TESSALON PERLES) 100 MG capsule, Take 1 capsule (100 mg total) by mouth every 6 (six) hours as needed. (Patient not taking: Reported on 09/21/2020), Disp: 30 capsule, Rfl: 1 .  HYDROcodone-homatropine (HYDROMET) 5-1.5 MG/5ML syrup, Take 5 mLs by mouth every 6 (six) hours as needed. (Patient not taking: Reported on  09/21/2020), Disp: 120 mL, Rfl: 0 .  lisinopril (ZESTRIL) 5 MG tablet, Take 1 tablet (5 mg total) by mouth daily., Disp: 90 tablet, Rfl: 1   No Known Allergies   Review of Systems  Constitutional: Negative.   HENT: Negative.   Eyes: Negative.  Negative for blurred vision.  Respiratory: Negative.   Cardiovascular: Negative.  Negative for chest pain and palpitations.  Gastrointestinal: Negative.   Endocrine: Negative.   Genitourinary: Negative.   Musculoskeletal: Positive for arthralgias (left knee pain).  Skin: Negative.   Neurological: Negative.  Negative for headaches.  Hematological: Negative.   Psychiatric/Behavioral: Negative.      Today's Vitals   09/21/20 1610  BP: 130/72  Pulse: 68  Temp: 97.8 F (36.6 C)  TempSrc: Oral  Weight: 167 lb 5.3 oz (75.9 kg)  Height: 5' 3.4" (1.61 m)  PainSc: 0-No pain   Body mass index is 29.27 kg/m.   Objective:  Physical Exam Constitutional:      General: He is not in acute distress.    Appearance: Normal appearance. He is normal weight.  Cardiovascular:     Rate and Rhythm: Normal rate and regular rhythm.     Pulses: Normal pulses.     Heart sounds: Normal heart sounds.  Pulmonary:     Effort: Pulmonary effort is normal. No respiratory distress.     Breath sounds: Normal breath sounds. No wheezing.  Musculoskeletal:        General: No swelling or tenderness. Normal range of motion.     Cervical back: Normal range of motion and neck supple.     Right lower leg:  No edema.     Left lower leg: No edema.  Skin:    General: Skin is warm and dry.     Capillary Refill: Capillary refill takes less than 2 seconds.  Neurological:     General: No focal deficit present.     Mental Status: He is alert and oriented to person, place, and time.  Psychiatric:        Mood and Affect: Mood normal.        Behavior: Behavior normal.        Thought Content: Thought content normal.        Judgment: Judgment normal.         Assessment  And Plan:     1. Essential hypertension  Chronic, fair control  Continue with current medications - lisinopril (ZESTRIL) 5 MG tablet; Take 1 tablet (5 mg total) by mouth daily.  Dispense: 90 tablet; Refill: 1 - CMP14+EGFR  2. Mixed hyperlipidemia  Chronic, he is encouraged to make sure he is taking his medications as directed - atorvastatin (LIPITOR) 20 MG tablet; Take 0.5 tablets (10 mg total) by mouth daily.  Dispense: 90 tablet; Refill: 1 - Lipid panel  3. Chronic pain of left knee  he declines a pain cream  If worsens return call to office   Patient was given opportunity to ask questions. Patient verbalized understanding of the plan and was able to repeat key elements of the plan. All questions were answered to their satisfaction.  [name], FNP   I, [name], FNP, have reviewed all documentation for this visit. The documentation on 09/21/20 for the exam, diagnosis, procedures, and orders are all accurate and complete.   THE PATIENT IS ENCOURAGED TO PRACTICE SOCIAL DISTANCING DUE TO THE COVID-19 PANDEMIC.

## 2020-09-22 LAB — CMP14+EGFR
ALT: 23 IU/L (ref 0–44)
AST: 24 IU/L (ref 0–40)
Albumin/Globulin Ratio: 1.3 (ref 1.2–2.2)
Albumin: 4.2 g/dL (ref 3.7–4.7)
Alkaline Phosphatase: 62 IU/L (ref 44–121)
BUN/Creatinine Ratio: 16 (ref 10–24)
BUN: 14 mg/dL (ref 8–27)
Bilirubin Total: 0.3 mg/dL (ref 0.0–1.2)
CO2: 24 mmol/L (ref 20–29)
Calcium: 9 mg/dL (ref 8.6–10.2)
Chloride: 104 mmol/L (ref 96–106)
Creatinine, Ser: 0.86 mg/dL (ref 0.76–1.27)
GFR calc Af Amer: 101 mL/min/{1.73_m2} (ref >59)
GFR calc non Af Amer: 87 mL/min/{1.73_m2} (ref >59)
Globulin, Total: 3.3 g/dL (ref 1.5–4.5)
Glucose: 105 mg/dL — ABNORMAL HIGH (ref 65–99)
Potassium: 4.3 mmol/L (ref 3.5–5.2)
Sodium: 141 mmol/L (ref 134–144)
Total Protein: 7.5 g/dL (ref 6.0–8.5)

## 2020-09-22 LAB — LIPID PANEL
Chol/HDL Ratio: 5 ratio (ref 0.0–5.0)
Cholesterol, Total: 204 mg/dL — ABNORMAL HIGH (ref 100–199)
HDL: 41 mg/dL (ref >39)
LDL Chol Calc (NIH): 100 mg/dL — ABNORMAL HIGH (ref 0–99)
Triglycerides: 373 mg/dL — ABNORMAL HIGH (ref 0–149)
VLDL Cholesterol Cal: 63 mg/dL — ABNORMAL HIGH (ref 5–40)

## 2021-03-21 ENCOUNTER — Ambulatory Visit: Payer: Medicare Other | Admitting: Nurse Practitioner

## 2021-05-10 ENCOUNTER — Ambulatory Visit (INDEPENDENT_AMBULATORY_CARE_PROVIDER_SITE_OTHER): Payer: Medicare Other | Admitting: Nurse Practitioner

## 2021-05-10 ENCOUNTER — Encounter: Payer: Self-pay | Admitting: Nurse Practitioner

## 2021-05-10 ENCOUNTER — Other Ambulatory Visit: Payer: Self-pay

## 2021-05-10 VITALS — BP 128/60 | HR 67 | Temp 98.2°F | Ht 64.0 in | Wt 164.0 lb

## 2021-05-10 DIAGNOSIS — I10 Essential (primary) hypertension: Secondary | ICD-10-CM | POA: Diagnosis not present

## 2021-05-10 DIAGNOSIS — R12 Heartburn: Secondary | ICD-10-CM

## 2021-05-10 DIAGNOSIS — Z23 Encounter for immunization: Secondary | ICD-10-CM | POA: Diagnosis not present

## 2021-05-10 DIAGNOSIS — E782 Mixed hyperlipidemia: Secondary | ICD-10-CM

## 2021-05-10 MED ORDER — ATORVASTATIN CALCIUM 20 MG PO TABS: 10.0000 mg | ORAL_TABLET | Freq: Every day | ORAL | 1 refills | Status: DC

## 2021-05-10 MED ORDER — LISINOPRIL 5 MG PO TABS: 5.0000 mg | ORAL_TABLET | Freq: Every day | ORAL | 1 refills | Status: DC

## 2021-05-10 NOTE — Progress Notes (Signed)
I,Yamilka J Llittleton,acting as a Neurosurgeon for [employer], FNP.,have documented all relevant documentation on the behalf of [name], FNP,as directed by  [name], FNP while in the presence of [name], FNP.   This visit occurred during the SARS-CoV-2 public health emergency.  Safety protocols were in place, including screening questions prior to the visit, additional usage of staff PPE, and extensive cleaning of exam room while observing appropriate contact time as indicated for disinfecting solutions.  Subjective:     Patient ID: Douglas Pruitt , male    DOB: 25-Jul-1951 , 70 y.o.   MRN: [id number]   Chief Complaint  Patient presents with   Hypertension    HPI  Patient presents today for a f/u on his hypertension and cholesterol.   He has ran out of his medications 2 days ago. Here today with Jediah interpreter.   Wt Readings from Last 3 Encounters: 05/10/21 : 164 lb (74.4 kg) 09/21/20 : 167 lb 5.3 oz (75.9 kg) 09/21/20 : 167 lb 6.4 oz (75.9 kg)    Hypertension This is a chronic problem. The current episode started more than 1 year ago. The problem is uncontrolled. Pertinent negatives include no anxiety, blurred vision, chest pain, headaches or palpitations. There are no associated agents to hypertension. Risk factors for coronary artery disease include male gender and sedentary lifestyle. Past treatments include ACE inhibitors. The current treatment provides significant improvement. There are no compliance problems.  There is no history of angina. There is no history of chronic renal disease.    History reviewed. No pertinent past medical history.   History reviewed. No pertinent family history.   Current Outpatient Medications:    atorvastatin (LIPITOR) 20 MG tablet, Take 0.5 tablets (10 mg total) by mouth daily., Disp: 90 tablet, Rfl: 1   lisinopril (ZESTRIL) 5 MG tablet, Take 1 tablet (5 mg total) by mouth daily., Disp: 90 tablet, Rfl: 1   No Known Allergies   Review of  Systems  Constitutional: Negative.   Eyes:  Negative for blurred vision.  Respiratory: Negative.    Cardiovascular:  Negative for chest pain, palpitations and leg swelling.  Neurological:  Negative for headaches.  Psychiatric/Behavioral: Negative.      Today's Vitals   05/10/21 1454  BP: 128/60  Pulse: 67  Temp: 98.2 F (36.8 C)  Weight: 164 lb (74.4 kg)  Height: 5\' 4"  (1.626 m)  PainSc: 5   PainLoc: Knee   Body mass index is 28.15 kg/m.   Objective:  Physical Exam Vitals reviewed.  Constitutional:      General: He is not in acute distress.    Appearance: Normal appearance. He is normal weight.  Cardiovascular:     Rate and Rhythm: Normal rate and regular rhythm.     Pulses: Normal pulses.     Heart sounds: Normal heart sounds.  Pulmonary:     Effort: Pulmonary effort is normal. No respiratory distress.     Breath sounds: Normal breath sounds. No wheezing.  Musculoskeletal:        General: No swelling or tenderness. Normal range of motion.     Cervical back: Normal range of motion and neck supple.     Right lower leg: No edema.     Left lower leg: No edema.  Skin:    General: Skin is warm and dry.     Capillary Refill: Capillary refill takes less than 2 seconds.  Neurological:     General: No focal deficit present.  Mental Status: He is alert and oriented to person, place, and time.     Cranial Nerves: No cranial nerve deficit.     Motor: No weakness.  Psychiatric:        Mood and Affect: Mood normal.        Behavior: Behavior normal.        Thought Content: Thought content normal.        Judgment: Judgment normal.        Assessment And Plan:     1. Essential hypertension Comments: Blood pressure is well controlled. Cotninue current medications - lisinopril (ZESTRIL) 5 MG tablet; Take 1 tablet (5 mg total) by mouth daily.  Dispense: 90 tablet; Refill: 1 - CMP14+EGFR  2. Mixed hyperlipidemia Comments: Slightly worse at last visit, encouraged to  continue taking statin as directed. - atorvastatin (LIPITOR) 20 MG tablet; Take 0.5 tablets (10 mg total) by mouth daily.  Dispense: 90 tablet; Refill: 1 - Lipid panel  3. Heartburn Comments: Encouraged to avoid food triggers If not better return call to office  4. Immunization due Influenza vaccine administered Encouraged to take Tylenol as needed for fever or muscle aches. - Flu Vaccine QUAD High Dose(Fluad)     Patient was given opportunity to ask questions. Patient verbalized understanding of the plan and was able to repeat key elements of the plan. All questions were answered to their satisfaction.  [name], FNP   I, [name], FNP, have reviewed all documentation for this visit. The documentation on 05/10/21 for the exam, diagnosis, procedures, and orders are all accurate and complete.   IF YOU HAVE BEEN REFERRED TO A SPECIALIST, IT MAY TAKE 1-2 WEEKS TO SCHEDULE/PROCESS THE REFERRAL. IF YOU HAVE NOT HEARD FROM US/SPECIALIST IN TWO WEEKS, PLEASE GIVE [place] A CALL AT [phone].   THE PATIENT IS ENCOURAGED TO PRACTICE SOCIAL DISTANCING DUE TO THE COVID-19 PANDEMIC.

## 2021-05-10 NOTE — Patient Instructions (Addendum)
Hypertension, Adult Hypertension is another name for high blood pressure. High blood pressure forces your heart to work harder to pump blood. This can cause problems over time. There are two numbers in a blood pressure reading. There is a top number (systolic) over a bottom number (diastolic). It is best to have a blood pressure that is below 120/80. Healthy choices can help lower your blood pressure, or you may need medicine to help lower it. What are the causes? The cause of this condition is not known. Some conditions may be related to high blood pressure. What increases the risk? Smoking. Having type 2 diabetes mellitus, high cholesterol, or both. Not getting enough exercise or physical activity. Being overweight. Having too much fat, sugar, calories, or salt (sodium) in your diet. Drinking too much alcohol. Having long-term (chronic) kidney disease. Having a family history of high blood pressure. Age. Risk increases with age. Race. You may be at higher risk if you are African American. Gender. Men are at higher risk than women before age 45. After age 65, women are at higher risk than men. Having obstructive sleep apnea. Stress. What are the signs or symptoms? High blood pressure may not cause symptoms. Very high blood pressure (hypertensive crisis) may cause: Headache. Feelings of worry or nervousness (anxiety). Shortness of breath. Nosebleed. A feeling of being sick to your stomach (nausea). Throwing up (vomiting). Changes in how you see. Very bad chest pain. Seizures. How is this treated? This condition is treated by making healthy lifestyle changes, such as: Eating healthy foods. Exercising more. Drinking less alcohol. Your health care provider may prescribe medicine if lifestyle changes are not enough to get your blood pressure under control, and if: Your top number is above 130. Your bottom number is above 80. Your personal target blood pressure may vary. Follow  these instructions at home: Eating and drinking  If told, follow the DASH eating plan. To follow this plan: Fill one half of your plate at each meal with fruits and vegetables. Fill one fourth of your plate at each meal with whole grains. Whole grains include whole-wheat pasta, brown rice, and whole-grain bread. Eat or drink low-fat dairy products, such as skim milk or low-fat yogurt. Fill one fourth of your plate at each meal with low-fat (lean) proteins. Low-fat proteins include fish, chicken without skin, eggs, beans, and tofu. Avoid fatty meat, cured and processed meat, or chicken with skin. Avoid pre-made or processed food. Eat less than 1,500 mg of salt each day. Do not drink alcohol if: Your doctor tells you not to drink. You are pregnant, may be pregnant, or are planning to become pregnant. If you drink alcohol: Limit how much you use to: 0-1 drink a day for women. 0-2 drinks a day for men. Be aware of how much alcohol is in your drink. In the [place] one drink equals one 12 oz bottle of beer (355 mL), one 5 oz glass of wine (148 mL), or one 1 oz glass of hard liquor (44 mL). Lifestyle  Work with your doctor to stay at a healthy weight or to lose weight. Ask your doctor what the best weight is for you. Get at least 30 minutes of exercise most days of the week. This may include walking, swimming, or biking. Get at least 30 minutes of exercise that strengthens your muscles (resistance exercise) at least 3 days a week. This may include lifting weights or doing Pilates. Do not use any products that contain nicotine or tobacco, such   as cigarettes, e-cigarettes, and chewing tobacco. If you need help quitting, ask your doctor. Check your blood pressure at home as told by your doctor. Keep all follow-up visits as told by your doctor. This is important. Medicines Take over-the-counter and prescription medicines only as told by your doctor. Follow directions carefully. Do not skip doses of  blood pressure medicine. The medicine does not work as well if you skip doses. Skipping doses also puts you at risk for problems. Ask your doctor about side effects or reactions to medicines that you should watch for. Contact a doctor if you: Think you are having a reaction to the medicine you are taking. Have headaches that keep coming back (recurring). Feel dizzy. Have swelling in your ankles. Have trouble with your vision. Get help right away if you: Get a very bad headache. Start to feel mixed up (confused). Feel weak or numb. Feel faint. Have very bad pain in your: Chest. Belly (abdomen). Throw up more than once. Have trouble breathing. Summary Hypertension is another name for high blood pressure. High blood pressure forces your heart to work harder to pump blood. For most people, a normal blood pressure is less than 120/80. Making healthy choices can help lower blood pressure. If your blood pressure does not get lower with healthy choices, you may need to take medicine. This information is not intended to replace advice given to you by your health care provider. Make sure you discuss any questions you have with your health care provider. Document Revised: 03/25/2018 Document Reviewed: 03/25/2018 Elsevier Patient Education  2022 Else[employer] Inc.  Influenza (Flu) Vaccine (Inactivated or Recombinant): What You Need to Know 1. Why get vaccinated? Influenza vaccine can prevent influenza (flu). Flu is a contagious disease that spreads around the [place] every year, usually between October and May. Anyone can get the flu, but it is more dangerous for some people. Infants and young children, people 65 years and older, pregnant people, and people with certain health conditions or a weakened immune system are at greatest risk of flu complications. Pneumonia, bronchitis, sinus infections, and ear infections are examples of flu-related complications. If you have a medical condition, such  as heart disease, cancer, or diabetes, flu can make it worse. Flu can cause fever and chills, sore throat, muscle aches, fatigue, cough, headache, and runny or stuffy nose. Some people may have vomiting and diarrhea, though this is more common in children than adults. In [place] average year, thousands of people in the [place] die from flu, and many more are hospitalized. Flu vaccine prevents millions of illnesses and flu-related visits to the doctor each year. 2. Influenza vaccines CDC recommends everyone 6 months and older get vaccinated every flu season. Children 6 months through 8 years of age may need 2 doses during a single flu season. Everyone else needs only 1 dose each flu season. It takes about 2 weeks for protection to develop after vaccination. There are many flu viruses, and they are always changing. Each year a new flu vaccine is made to protect against the influenza viruses believed to be likely to cause disease in the upcoming flu season. Even when the vaccine doesn't exactly match these viruses, it may still provide some protection. Influenza vaccine does not cause flu. Influenza vaccine may be given at the same time as other vaccines. 3. Talk with your health care provider Tell your vaccination provider if the person getting the vaccine: Has had Migel allergic reaction after a previous dose of influenza   vaccine, or has any severe, life-threatening allergies Has ever had Guillain-Barr Syndrome (also called "GBS") In some cases, your health care provider may decide to postpone influenza vaccination until a future visit. Influenza vaccine can be administered at any time during pregnancy. People who are or will be pregnant during influenza season should receive inactivated influenza vaccine. People with minor illnesses, such as a cold, may be vaccinated. People who are moderately or severely ill should usually wait until they recover before getting influenza vaccine. Your health care  provider can give you more information. 4. Risks of a vaccine reaction Soreness, redness, and swelling where the shot is given, fever, muscle aches, and headache can happen after influenza vaccination. There may be a very small increased risk of Guillain-Barr Syndrome (GBS) after inactivated influenza vaccine (the flu shot). Young children who get the flu shot along with pneumococcal vaccine (PCV13) and/or DTaP vaccine at the same time might be slightly more likely to have a seizure caused by fever. Tell your health care provider if a child who is getting flu vaccine has ever had a seizure. People sometimes faint after medical procedures, including vaccination. Tell your provider if you feel dizzy or have vision changes or ringing in the ears. As with any medicine, there is a very remote chance of a vaccine causing a severe allergic reaction, other serious injury, or death. 5. What if there is a serious problem? Chritopher allergic reaction could occur after the vaccinated person leaves the clinic. If you see signs of a severe allergic reaction (hives, swelling of the face and throat, difficulty breathing, a fast heartbeat, dizziness, or weakness), call 9-1-1 and get the person to the nearest hospital. For other signs that concern you, call your health care provider. Adverse reactions should be reported to the Vaccine Adverse Event Reporting System (VAERS). Your health care provider will usually file this report, or you can do it yourself. Visit the VAERS website at www.vaers.hhs.gov or call 1-800-822-7967. VAERS is only for reporting reactions, and VAERS staff members do not give medical advice. 6. The National Vaccine Injury Compensation Program The National Vaccine Injury Compensation Program (VICP) is a federal program that was created to compensate people who may have been injured by certain vaccines. Claims regarding alleged injury or death due to vaccination have a time limit for filing, which may be as  short as two years. Visit the VICP website at www.hrsa.gov/vaccinecompensation or call 1-800-338-2382 to learn about the program and about filing a claim. 7. How can I learn more? Ask your health care provider. Call your local or state health department. Visit the website of the Food and Drug Administration (FDA) for vaccine package inserts and additional information at www.fda.gov/vaccines-blood-biologics/vaccines. Contact the Centers for Disease Control and Prevention (CDC): Call 1-800-232-4636 (1-800-CDC-INFO) or Visit CDC's website at www.cdc.gov/flu. Vaccine Information Statement Inactivated Influenza Vaccine (03/03/2020) This information is not intended to replace advice given to you by your health care provider. Make sure you discuss any questions you have with your health care provider. Document Revised: 04/20/2020 Document Reviewed: 04/20/2020 Elsevier Patient Education  2022 Elsevier Inc.  

## 2021-05-11 LAB — LIPID PANEL
Chol/HDL Ratio: 5.5 ratio — ABNORMAL HIGH (ref 0.0–5.0)
Cholesterol, Total: 220 mg/dL — ABNORMAL HIGH (ref 100–199)
HDL: 40 mg/dL (ref >39)
LDL Chol Calc (NIH): 120 mg/dL — ABNORMAL HIGH (ref 0–99)
Triglycerides: 343 mg/dL — ABNORMAL HIGH (ref 0–149)
VLDL Cholesterol Cal: 60 mg/dL — ABNORMAL HIGH (ref 5–40)

## 2021-05-11 LAB — CMP14+EGFR
ALT: 25 IU/L (ref 0–44)
AST: 22 IU/L (ref 0–40)
Albumin/Globulin Ratio: 1.3 (ref 1.2–2.2)
Albumin: 4.6 g/dL (ref 3.7–4.7)
Alkaline Phosphatase: 60 IU/L (ref 44–121)
BUN/Creatinine Ratio: 13 (ref 10–24)
BUN: 11 mg/dL (ref 8–27)
Bilirubin Total: 0.5 mg/dL (ref 0.0–1.2)
CO2: 23 mmol/L (ref 20–29)
Calcium: 9 mg/dL (ref 8.6–10.2)
Chloride: 103 mmol/L (ref 96–106)
Creatinine, Ser: 0.86 mg/dL (ref 0.76–1.27)
Globulin, Total: 3.5 g/dL (ref 1.5–4.5)
Glucose: 91 mg/dL (ref 70–99)
Potassium: 4.4 mmol/L (ref 3.5–5.2)
Sodium: 141 mmol/L (ref 134–144)
Total Protein: 8.1 g/dL (ref 6.0–8.5)
eGFR: 92 mL/min/{1.73_m2} (ref >59)

## 2021-05-27 MED ORDER — ATORVASTATIN CALCIUM 20 MG PO TABS: 20.0000 mg | ORAL_TABLET | Freq: Every day | ORAL | 1 refills | Status: DC

## 2021-10-10 ENCOUNTER — Ambulatory Visit: Payer: Medicare Other

## 2021-10-10 ENCOUNTER — Ambulatory Visit: Payer: Medicare Other | Admitting: Nurse Practitioner

## 2021-10-22 ENCOUNTER — Emergency Department (HOSPITAL_COMMUNITY): Payer: Medicare Other

## 2021-10-22 ENCOUNTER — Encounter: Payer: Self-pay | Admitting: Nurse Practitioner

## 2021-10-22 ENCOUNTER — Other Ambulatory Visit: Payer: Self-pay

## 2021-10-22 ENCOUNTER — Inpatient Hospital Stay (HOSPITAL_COMMUNITY): Payer: Medicare Other

## 2021-10-22 ENCOUNTER — Inpatient Hospital Stay (HOSPITAL_COMMUNITY)
Admission: EM | Admit: 2021-10-22 | Discharge: 2021-10-23 | DRG: 064 | Disposition: A | Discharge status: Home / Self Care | Payer: Medicare Other | Attending: Family Medicine | Admitting: Family Medicine

## 2021-10-22 ENCOUNTER — Encounter (HOSPITAL_COMMUNITY): Payer: Self-pay | Admitting: Emergency Medicine

## 2021-10-22 ENCOUNTER — Ambulatory Visit (INDEPENDENT_AMBULATORY_CARE_PROVIDER_SITE_OTHER): Payer: Medicare Other | Admitting: Nurse Practitioner

## 2021-10-22 DIAGNOSIS — Z9114 Patient's other noncompliance with medication regimen: Secondary | ICD-10-CM

## 2021-10-22 DIAGNOSIS — I6521 Occlusion and stenosis of right carotid artery: Secondary | ICD-10-CM | POA: Diagnosis not present

## 2021-10-22 DIAGNOSIS — K148 Other diseases of tongue: Secondary | ICD-10-CM | POA: Diagnosis present

## 2021-10-22 DIAGNOSIS — Z603 Acculturation difficulty: Secondary | ICD-10-CM | POA: Diagnosis present

## 2021-10-22 DIAGNOSIS — G8929 Other chronic pain: Secondary | ICD-10-CM | POA: Diagnosis not present

## 2021-10-22 DIAGNOSIS — Z79899 Other long term (current) drug therapy: Secondary | ICD-10-CM

## 2021-10-22 DIAGNOSIS — Z1389 Encounter for screening for other disorder: Secondary | ICD-10-CM | POA: Diagnosis not present

## 2021-10-22 DIAGNOSIS — R414 Neurologic neglect syndrome: Secondary | ICD-10-CM | POA: Diagnosis present

## 2021-10-22 DIAGNOSIS — I6621 Occlusion and stenosis of right posterior cerebral artery: Secondary | ICD-10-CM | POA: Diagnosis not present

## 2021-10-22 DIAGNOSIS — G510 Bell's palsy: Secondary | ICD-10-CM | POA: Diagnosis not present

## 2021-10-22 DIAGNOSIS — S061XAA Traumatic cerebral edema with loss of consciousness status unknown, initial encounter: Secondary | ICD-10-CM | POA: Diagnosis not present

## 2021-10-22 DIAGNOSIS — I63233 Cerebral infarction due to unspecified occlusion or stenosis of bilateral carotid arteries: Secondary | ICD-10-CM | POA: Diagnosis not present

## 2021-10-22 DIAGNOSIS — I63411 Cerebral infarction due to embolism of right middle cerebral artery: Secondary | ICD-10-CM | POA: Diagnosis not present

## 2021-10-22 DIAGNOSIS — I639 Cerebral infarction, unspecified: Principal | ICD-10-CM

## 2021-10-22 DIAGNOSIS — E782 Mixed hyperlipidemia: Secondary | ICD-10-CM | POA: Diagnosis present

## 2021-10-22 DIAGNOSIS — R29810 Facial weakness: Secondary | ICD-10-CM | POA: Diagnosis present

## 2021-10-22 DIAGNOSIS — I10 Essential (primary) hypertension: Secondary | ICD-10-CM

## 2021-10-22 DIAGNOSIS — R410 Disorientation, unspecified: Secondary | ICD-10-CM

## 2021-10-22 DIAGNOSIS — I63511 Cerebral infarction due to unspecified occlusion or stenosis of right middle cerebral artery: Principal | ICD-10-CM | POA: Diagnosis present

## 2021-10-22 DIAGNOSIS — G936 Cerebral edema: Secondary | ICD-10-CM | POA: Diagnosis not present

## 2021-10-22 DIAGNOSIS — M25561 Pain in right knee: Secondary | ICD-10-CM | POA: Diagnosis not present

## 2021-10-22 DIAGNOSIS — I63213 Cerebral infarction due to unspecified occlusion or stenosis of bilateral vertebral arteries: Secondary | ICD-10-CM | POA: Diagnosis not present

## 2021-10-22 DIAGNOSIS — I63512 Cerebral infarction due to unspecified occlusion or stenosis of left middle cerebral artery: Secondary | ICD-10-CM | POA: Diagnosis not present

## 2021-10-22 DIAGNOSIS — I6389 Other cerebral infarction: Secondary | ICD-10-CM | POA: Diagnosis not present

## 2021-10-22 DIAGNOSIS — I6529 Occlusion and stenosis of unspecified carotid artery: Secondary | ICD-10-CM

## 2021-10-22 DIAGNOSIS — H5462 Unqualified visual loss, left eye, normal vision right eye: Secondary | ICD-10-CM | POA: Diagnosis present

## 2021-10-22 DIAGNOSIS — Y92410 Unspecified street and highway as the place of occurrence of the external cause: Secondary | ICD-10-CM

## 2021-10-22 DIAGNOSIS — I63532 Cerebral infarction due to unspecified occlusion or stenosis of left posterior cerebral artery: Secondary | ICD-10-CM | POA: Diagnosis not present

## 2021-10-22 DIAGNOSIS — I672 Cerebral atherosclerosis: Secondary | ICD-10-CM | POA: Diagnosis present

## 2021-10-22 DIAGNOSIS — Z66 Do not resuscitate: Secondary | ICD-10-CM | POA: Diagnosis not present

## 2021-10-22 HISTORY — DX: Cerebral infarction, unspecified: I63.9

## 2021-10-22 HISTORY — DX: Essential (primary) hypertension: I10

## 2021-10-22 LAB — COMPREHENSIVE METABOLIC PANEL
ALT: 29 U/L (ref 0–44)
AST: 22 U/L (ref 15–41)
Albumin: 4.1 g/dL (ref 3.5–5.0)
Alkaline Phosphatase: 68 U/L (ref 38–126)
Anion gap: 8 (ref 5–15)
BUN: 16 mg/dL (ref 8–23)
CO2: 23 mmol/L (ref 22–32)
Calcium: 9.4 mg/dL (ref 8.9–10.3)
Chloride: 104 mmol/L (ref 98–111)
Creatinine, Ser: 0.95 mg/dL (ref 0.61–1.24)
GFR, Estimated: >60 mL/min (ref >60)
Glucose, Bld: 110 mg/dL — ABNORMAL HIGH (ref 70–99)
Potassium: 4.3 mmol/L (ref 3.5–5.1)
Sodium: 135 mmol/L (ref 135–145)
Total Bilirubin: 1.5 mg/dL — ABNORMAL HIGH (ref 0.3–1.2)
Total Protein: 8.6 g/dL — ABNORMAL HIGH (ref 6.5–8.1)

## 2021-10-22 LAB — DIFFERENTIAL
Abs Immature Granulocytes: 0.03 10*3/uL (ref 0.00–0.07)
Basophils Absolute: 0.1 10*3/uL (ref 0.0–0.1)
Basophils Relative: 0 %
Eosinophils Absolute: 0.1 10*3/uL (ref 0.0–0.5)
Eosinophils Relative: 0 %
Immature Granulocytes: 0 %
Lymphocytes Relative: 42 %
Lymphs Abs: 5.3 10*3/uL — ABNORMAL HIGH (ref 0.7–4.0)
Monocytes Absolute: 0.8 10*3/uL (ref 0.1–1.0)
Monocytes Relative: 6 %
Neutro Abs: 6.6 10*3/uL (ref 1.7–7.7)
Neutrophils Relative %: 52 %

## 2021-10-22 LAB — URINALYSIS, ROUTINE W REFLEX MICROSCOPIC
Bilirubin Urine: NEGATIVE
Glucose, UA: NEGATIVE mg/dL
Hgb urine dipstick: NEGATIVE
Ketones, ur: NEGATIVE mg/dL
Leukocytes,Ua: NEGATIVE
Nitrite: NEGATIVE
Protein, ur: NEGATIVE mg/dL
Specific Gravity, Urine: 1.021 (ref 1.005–1.030)
pH: 5 (ref 5.0–8.0)

## 2021-10-22 LAB — I-STAT CHEM 8, ED
BUN: 18 mg/dL (ref 8–23)
Calcium, Ion: 1.09 mmol/L — ABNORMAL LOW (ref 1.15–1.40)
Chloride: 102 mmol/L (ref 98–111)
Creatinine, Ser: 1 mg/dL (ref 0.61–1.24)
Glucose, Bld: 109 mg/dL — ABNORMAL HIGH (ref 70–99)
HCT: 52 % (ref 39.0–52.0)
Hemoglobin: 17.7 g/dL — ABNORMAL HIGH (ref 13.0–17.0)
Potassium: 4.2 mmol/L (ref 3.5–5.1)
Sodium: 137 mmol/L (ref 135–145)
TCO2: 26 mmol/L (ref 22–32)

## 2021-10-22 LAB — CBC
HCT: 51 % (ref 39.0–52.0)
Hemoglobin: 17 g/dL (ref 13.0–17.0)
MCH: 28.9 pg (ref 26.0–34.0)
MCHC: 33.3 g/dL (ref 30.0–36.0)
MCV: 86.7 fL (ref 80.0–100.0)
Platelets: 205 10*3/uL (ref 150–400)
RBC: 5.88 MIL/uL — ABNORMAL HIGH (ref 4.22–5.81)
RDW: 13.3 % (ref 11.5–15.5)
WBC: 12.7 10*3/uL — ABNORMAL HIGH (ref 4.0–10.5)
nRBC: 0 % (ref 0.0–0.2)

## 2021-10-22 LAB — RAPID URINE DRUG SCREEN, HOSP PERFORMED
Amphetamines: NOT DETECTED
Barbiturates: NOT DETECTED
Benzodiazepines: NOT DETECTED
Cocaine: NOT DETECTED
Opiates: NOT DETECTED
Tetrahydrocannabinol: NOT DETECTED

## 2021-10-22 LAB — PROTIME-INR
INR: 1 (ref 0.8–1.2)
Prothrombin Time: 12.9 seconds (ref 11.4–15.2)

## 2021-10-22 LAB — APTT: aPTT: 29 seconds (ref 24–36)

## 2021-10-22 LAB — ETHANOL: Alcohol, Ethyl (B): <10 mg/dL (ref <10)

## 2021-10-22 LAB — CBG MONITORING, ED: Glucose-Capillary: 93 mg/dL (ref 70–99)

## 2021-10-22 IMAGING — CT CT ANGIO HEAD-NECK (W OR W/O PERF)
2 of 7 series · 8 of 33 positions shown · non-contrast
Comparison: Same day head CT.
COMPARISON: Same day head CT.

Addendum:
CLINICAL DATA: Stroke, follow up

EXAM:
CT ANGIOGRAPHY HEAD AND NECK
TECHNIQUE: Multidetector CT imaging of the head and neck was performed using
the standard protocol during bolus administration of intravenous
contrast. Multiplanar CT image reconstructions and MIPs were
obtained to evaluate the vascular anatomy. Carotid stenosis
measurements (when applicable) are obtained utilizing NASCET
criteria, using the distal internal carotid diameter as the
denominator.

[Series 6: cta neck · axial · 0.50mm/px · z∈[-618,-504]mm · 2 of 173 slices shown]
[im 58/173  soft-tissue]
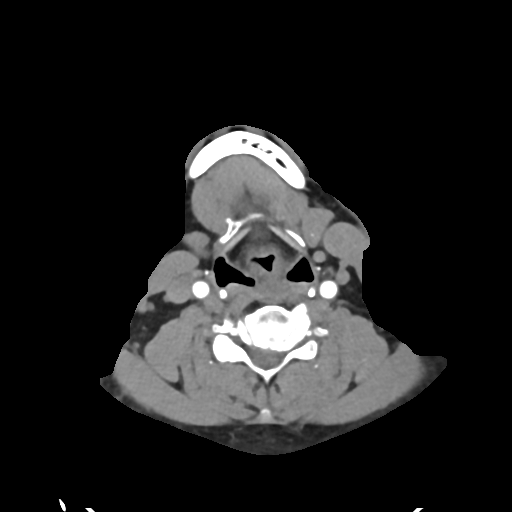
[im 115/173  bone]
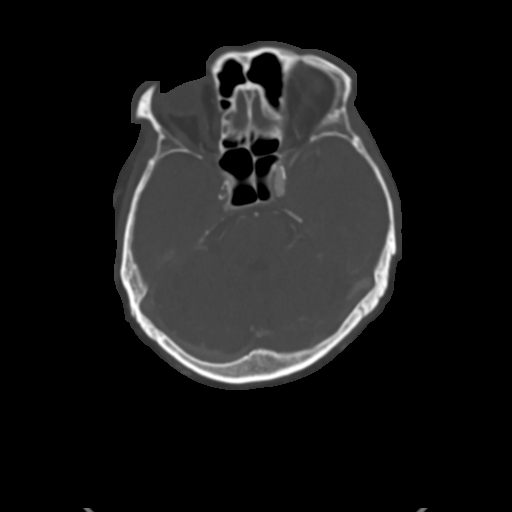

[Series 8: cta neck axial · axial · 0.46mm/px · z∈[-703,-461]mm · 6 of 344 slices shown]
[im 50/344  soft-tissue]
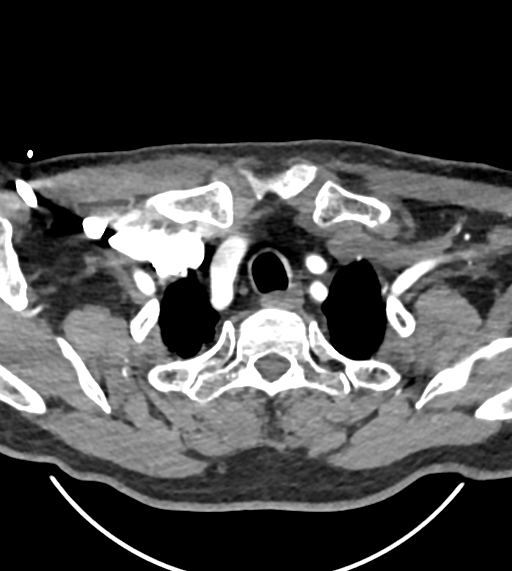
[im 99/344  soft-tissue]
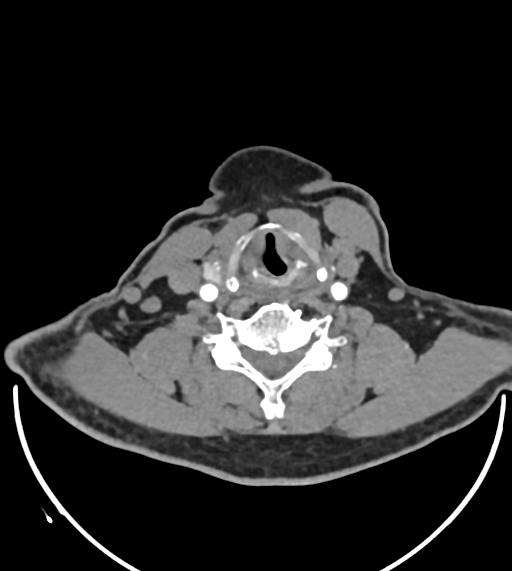
[im 148/344  soft-tissue]
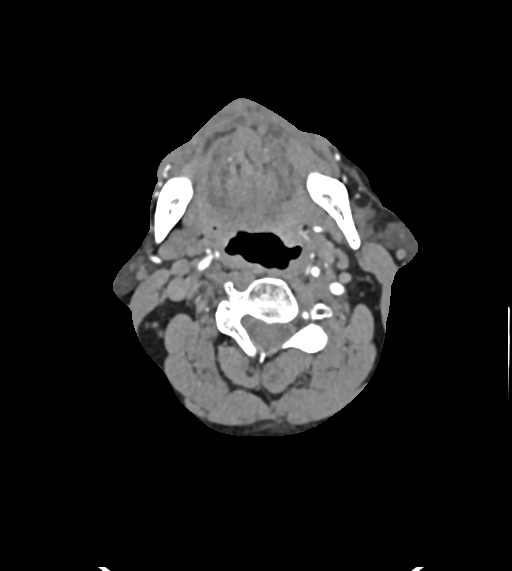
[im 197/344  soft-tissue]
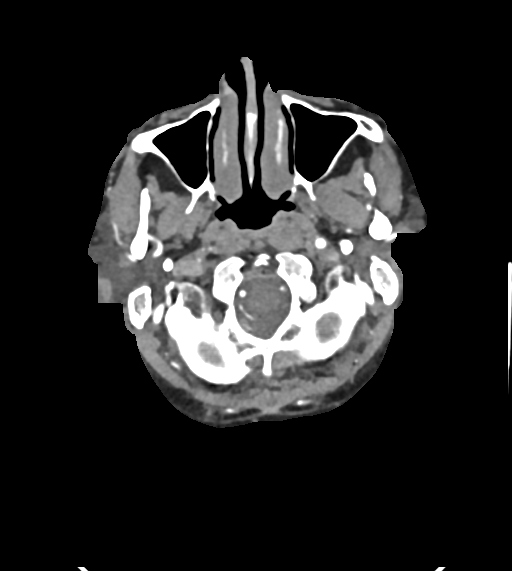
[im 246/344  soft-tissue]
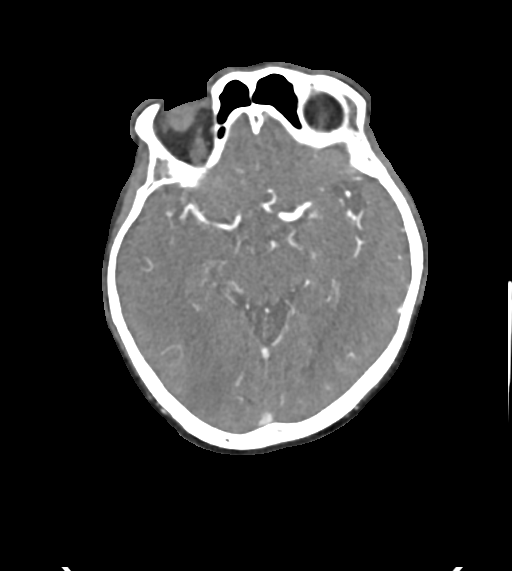
[im 295/344  soft-tissue]
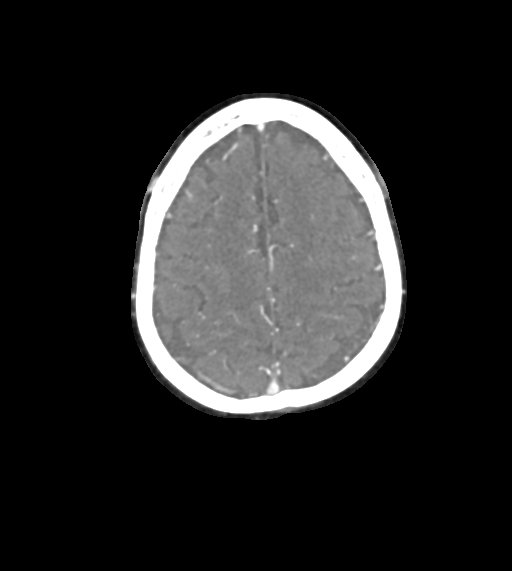

[8 of 33 positions shown; findings below may reference images not displayed]

RADIATION DOSE REDUCTION: This exam was performed according to the
departmental dose-optimization program which includes automated
exposure control, adjustment of the mA and/or kV according to
patient size and/or use of iterative reconstruction technique.

CONTRAST:  75mL OMNIPAQUE IOHEXOL 350 MG/ML SOLN
FINDINGS: CTA NECK FINDINGS

Aortic arch: Great vessel origins are patent without significant
stenosis. Calcific and noncalcific atherosclerosis of the aorta and
the great vessel origins.

Right carotid system: Common carotid arteries patent. There is age
indeterminate occlusion of the internal carotid artery origin with
non opacification throughout the neck.

Left carotid system: Common carotid artery is patent. Calcific and
noncalcific atherosclerosis at the carotid bifurcation with
approximally 60-70% stenosis of the proximal ICA.

Vertebral arteries: Mild right vertebral artery origin stenosis.
Severe stenosis of the non dominant left vertebral artery origin.
Multifocal mild stenosis of bilateral vertebral arteries in the neck
due to mass effect from osteophytes. Right dominant.

Skeleton: Degenerative changes.

Other neck: No acute abnormality.

Upper chest: Visualized lung apices are clear.

Review of the MIP images confirms the above findings

CTA HEAD FINDINGS

Anterior circulation: Minimal/faint opacification of the petrous ICA
with essentially non-opacified common carotid artery and
reconstitution of the paraclinoid ICA. The right M1 MCAs patent, but
small. Proximal M2 MCA vessels are patent on the right. Moderate to
severe. Diagnosis of the left paraclinoid ICA with severe left M1
MCA stenosis. Proximal left M2 MCA vessels are patent. Right A1 ACA
is not opacified, potentially in part congenital. Left A1 and
bilateral A2 ACAs are patent.

Posterior circulation: Small/non dominant left intradural vertebral
artery, which probably largely terminates as PICA, although there is
a suspected superimposed severe stenosis versus occlusion of the
more distal intradural vertebral artery. Right intradural vertebral
artery, basilar artery and bilateral posterior cerebral arteries are
patent. There is severe left P2 PCA stenosis and multifocal mild
right PCA stenosis.

Venous sinuses: As permitted by contrast timing, patent.

Review of the MIP images confirms the above findings
IMPRESSION: 1. Age indeterminate occlusion of proximal right ICA in the neck
with reconstitution of the paraclinoid right ICA. Right M1 MCA is
patent, but diminutive.
2. Severe left M1 MCA and left P2 PCA stenosis.
3. Moderate to severe left paraclinoid ICA stenosis
4. Approximately 60-70% stenosis of the proximal left ICA in the
neck.
5. Severe left and mild right vertebral artery origin stenosis.
6. The non-dominant left intradural vertebral artery is poorly
opacified, probably a combination of being small/non dominant and
superimposed occlusion or high-grade stenosis.

ADDENDUM:
Findings discussed with Dr. ROSE via telephone at [DATE] p.m.

*** End of Addendum ***
RADIATION DOSE REDUCTION: This exam was performed according to the
departmental dose-optimization program which includes automated
exposure control, adjustment of the mA and/or kV according to
patient size and/or use of iterative reconstruction technique.

CONTRAST:  75mL OMNIPAQUE IOHEXOL 350 MG/ML SOLN
FINDINGS: CTA NECK FINDINGS

Aortic arch: Great vessel origins are patent without significant
stenosis. Calcific and noncalcific atherosclerosis of the aorta and
the great vessel origins.

Right carotid system: Common carotid arteries patent. There is age
indeterminate occlusion of the internal carotid artery origin with
non opacification throughout the neck.

Left carotid system: Common carotid artery is patent. Calcific and
noncalcific atherosclerosis at the carotid bifurcation with
approximally 60-70% stenosis of the proximal ICA.

Vertebral arteries: Mild right vertebral artery origin stenosis.
Severe stenosis of the non dominant left vertebral artery origin.
Multifocal mild stenosis of bilateral vertebral arteries in the neck
due to mass effect from osteophytes. Right dominant.

Skeleton: Degenerative changes.

Other neck: No acute abnormality.

Upper chest: Visualized lung apices are clear.

Review of the MIP images confirms the above findings

CTA HEAD FINDINGS

Anterior circulation: Minimal/faint opacification of the petrous ICA
with essentially non-opacified common carotid artery and
reconstitution of the paraclinoid ICA. The right M1 MCAs patent, but
small. Proximal M2 MCA vessels are patent on the right. Moderate to
severe. Diagnosis of the left paraclinoid ICA with severe left M1
MCA stenosis. Proximal left M2 MCA vessels are patent. Right A1 ACA
is not opacified, potentially in part congenital. Left A1 and
bilateral A2 ACAs are patent.

Posterior circulation: Small/non dominant left intradural vertebral
artery, which probably largely terminates as PICA, although there is
a suspected superimposed severe stenosis versus occlusion of the
more distal intradural vertebral artery. Right intradural vertebral
artery, basilar artery and bilateral posterior cerebral arteries are
patent. There is severe left P2 PCA stenosis and multifocal mild
right PCA stenosis.

Venous sinuses: As permitted by contrast timing, patent.

Review of the MIP images confirms the above findings
IMPRESSION: 1. Age indeterminate occlusion of proximal right ICA in the neck
with reconstitution of the paraclinoid right ICA. Right M1 MCA is
patent, but diminutive.
2. Severe left M1 MCA and left P2 PCA stenosis.
3. Moderate to severe left paraclinoid ICA stenosis
4. Approximately 60-70% stenosis of the proximal left ICA in the
neck.
5. Severe left and mild right vertebral artery origin stenosis.
6. The non-dominant left intradural vertebral artery is poorly
opacified, probably a combination of being small/non dominant and
superimposed occlusion or high-grade stenosis.

## 2021-10-22 IMAGING — CT CT HEAD W/O CM
4 of 5 series · 15 of 47 positions shown, 17 images · non-contrast
Comparison: None.

CLINICAL DATA: Neuro deficit, acute, stroke suspected



[Series 3: head without · axial · non-contrast · 0.39mm/px · z∈[-499,-394]mm · 7 of 29 slices shown, 9 images]
[im 4/29  brain]
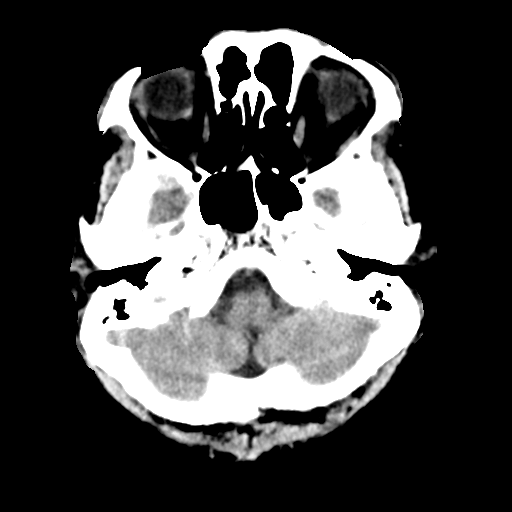
[im 4/29  bone]
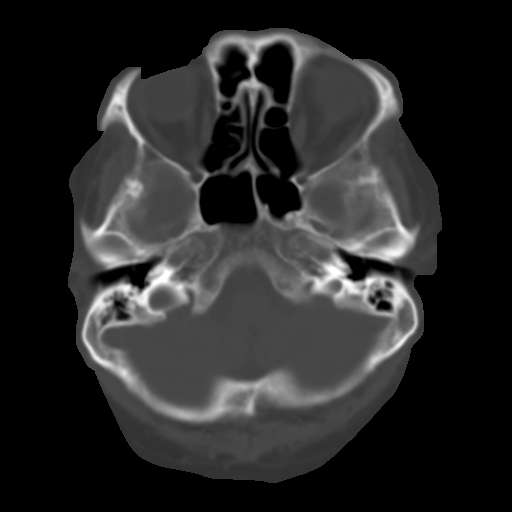
[im 8/29  brain]
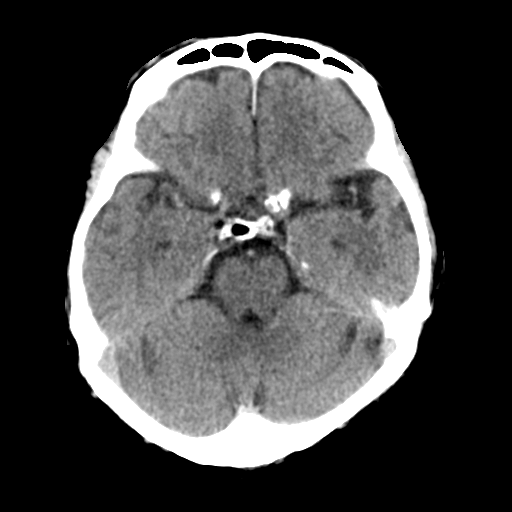
[im 11/29  brain]
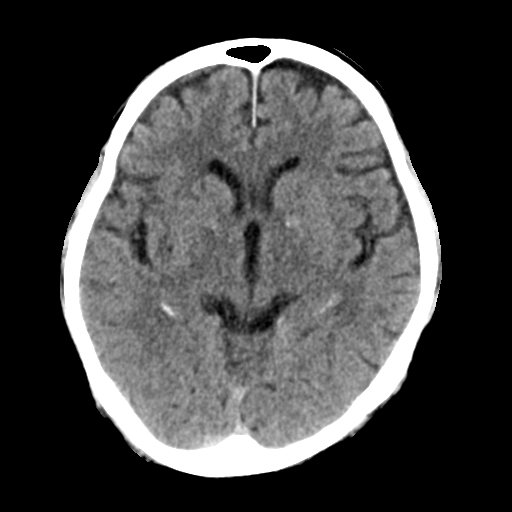
[im 15/29  brain]
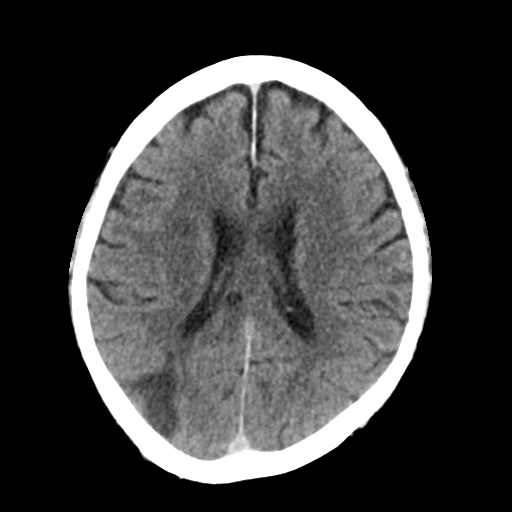
[im 18/29  brain]
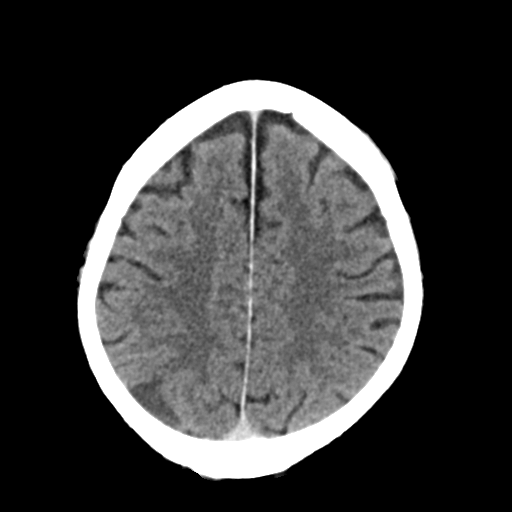
[im 18/29  bone]
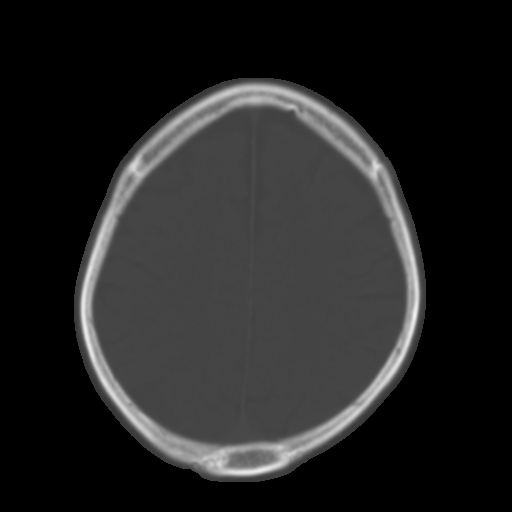
[im 22/29  brain]
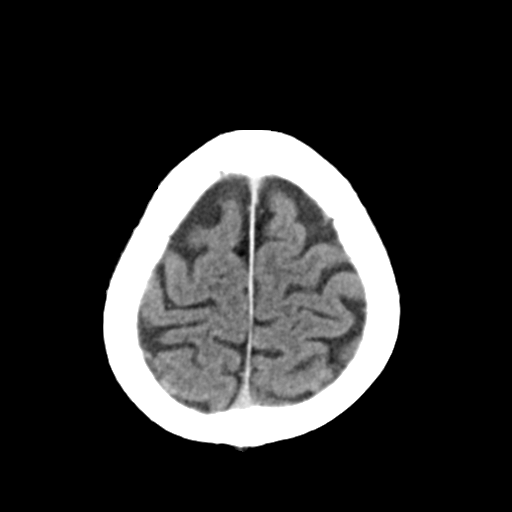
[im 25/29  brain]
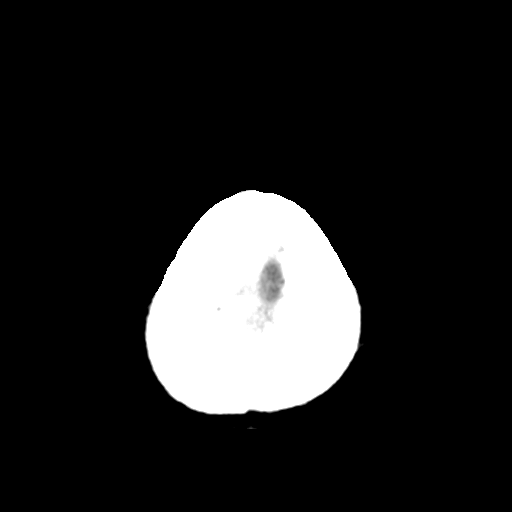

[Series 4: head bone · axial · 0.39mm/px · z∈[-499,-485]mm · 2 of 72 slices shown]
[im 8/72  bone]
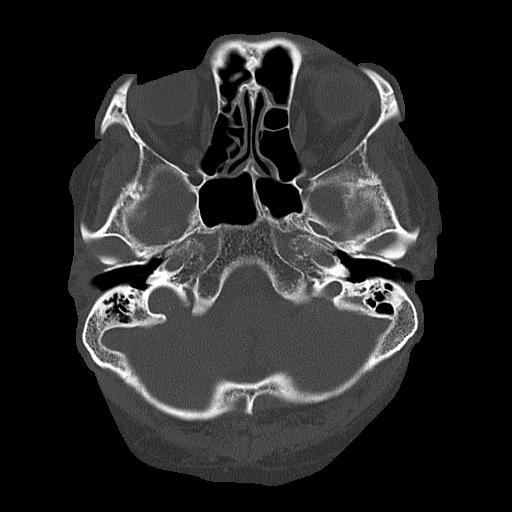
[im 15/72  bone]
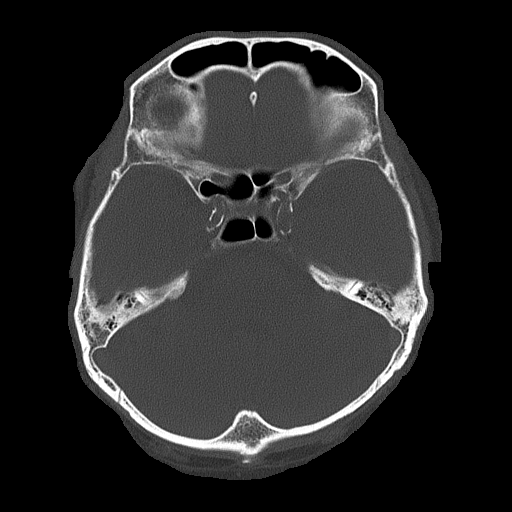

[Series 7: head without cor · coronal · non-contrast · 0.31mm/px · 3 of 66 slices shown]
[im 17/66  brain]
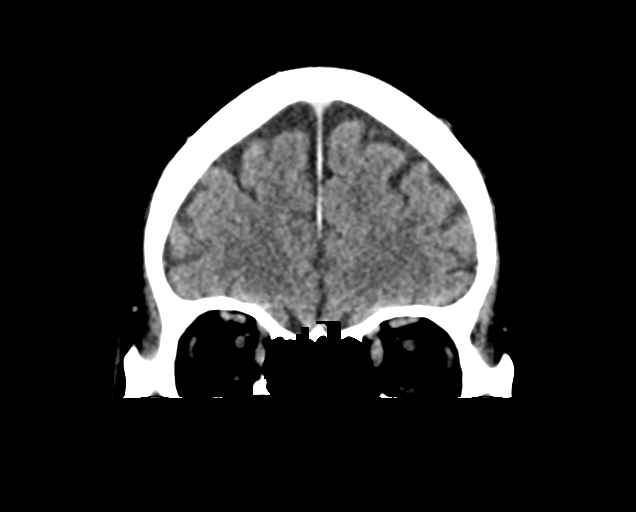
[im 33/66  brain]
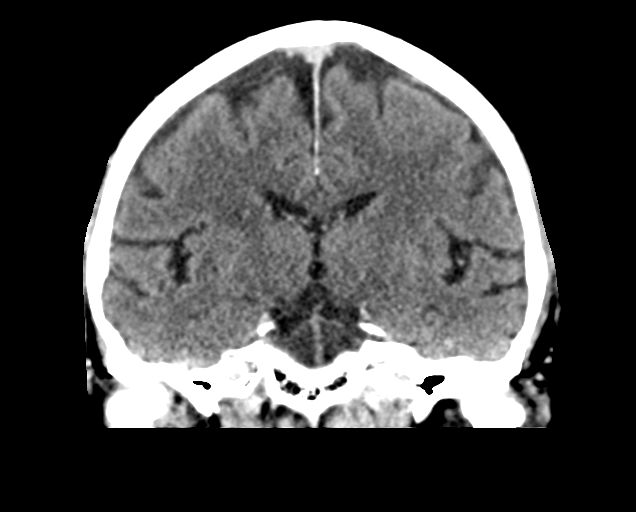
[im 49/66  brain]
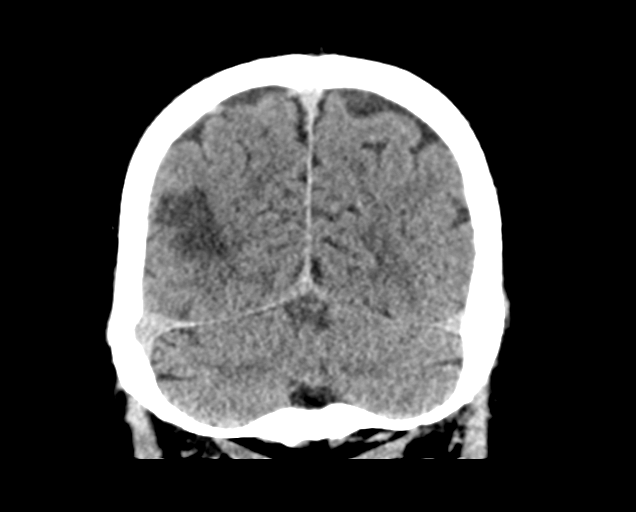

[Series 9: head without sag · sagittal · non-contrast · 0.29mm/px · 3 of 67 slices shown]
[im 23/67  brain]
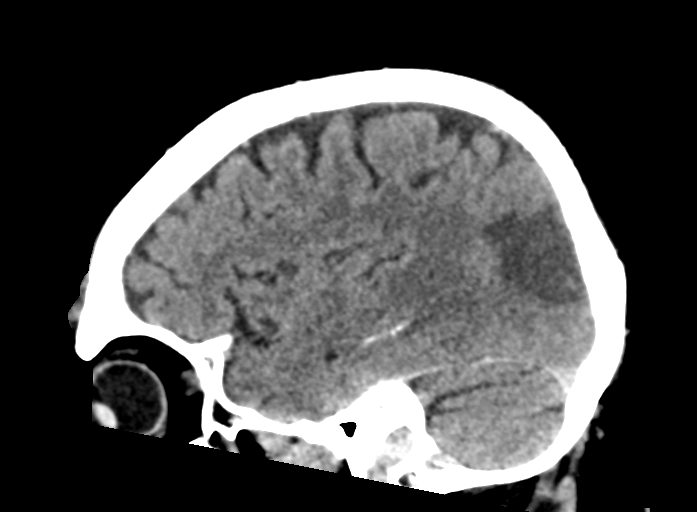
[im 34/67  brain]
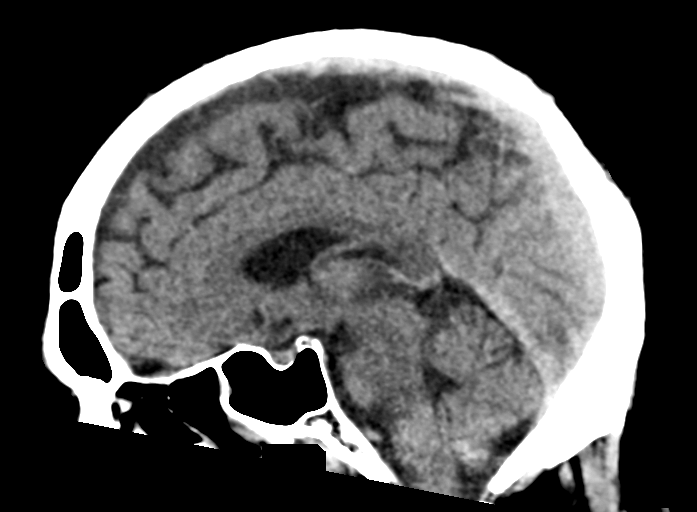
[im 45/67  brain]
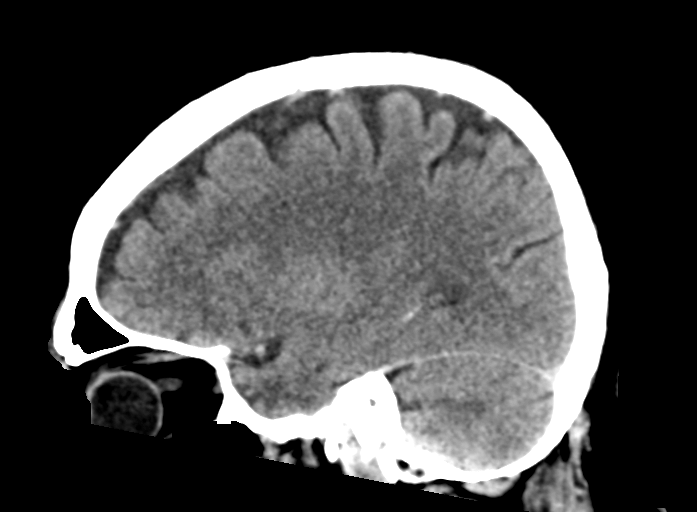

[15 of 47 positions shown; findings below may reference images not displayed]

FINDINGS: Brain: There is loss of gray-white matter differentiation and
cytotoxic edema in the right parietal lobe. There is also focal
hypoattenuation in the right basal ganglia adjacent right frontal
corona radiata.No significant midline shift. There is no acute
intracranial hemorrhage. The ventricles are normal in size.

Vascular: No hyperdense vessel or unexpected calcification.

Skull: Normal. Negative for fracture or focal lesion.

Sinuses/Orbits: No acute finding.

Other: None.
IMPRESSION: Acute infarct in the right parietal lobe. Possible additional
ischemia in the right basal ganglia and corona radiata. No acute
intracranial hemorrhage.

Critical Value/emergent results were called by telephone at the time
of interpretation on [DATE] at [DATE] to provider Dr. DEUMACAN, who
verbally acknowledged these results.

## 2021-10-22 MED ORDER — LISINOPRIL 5 MG PO TABS: 5.0000 mg | ORAL_TABLET | Freq: Every day | ORAL | 1 refills | Status: DC

## 2021-10-22 MED ORDER — ACETAMINOPHEN 650 MG RE SUPP: 650.0000 mg | RECTAL | Status: DC | PRN

## 2021-10-22 MED ORDER — HYDRALAZINE HCL 20 MG/ML IJ SOLN: 5.0000 mg | Freq: Four times a day (QID) | INTRAMUSCULAR | Status: DC | PRN

## 2021-10-22 MED ORDER — ATORVASTATIN CALCIUM 40 MG PO TABS
40.0000 mg | ORAL_TABLET | Freq: Every day | ORAL | Status: DC
  Administered 2021-10-22 – 2021-10-23 (×2): 40 mg via ORAL
  Filled 2021-10-22 (×2): qty 1

## 2021-10-22 MED ORDER — ENOXAPARIN SODIUM 40 MG/0.4ML IJ SOSY
40.0000 mg | PREFILLED_SYRINGE | INTRAMUSCULAR | Status: DC
  Administered 2021-10-22 – 2021-10-23 (×2): 40 mg via SUBCUTANEOUS
  Filled 2021-10-22 (×2): qty 0.4

## 2021-10-22 MED ORDER — SENNOSIDES-DOCUSATE SODIUM 8.6-50 MG PO TABS: 1.0000 | ORAL_TABLET | Freq: Every evening | ORAL | Status: DC | PRN

## 2021-10-22 MED ORDER — LORAZEPAM 2 MG/ML IJ SOLN: 0.5000 mg | INTRAMUSCULAR | Status: DC | PRN

## 2021-10-22 MED ORDER — ASPIRIN EC 81 MG PO TBEC
244.0000 mg | DELAYED_RELEASE_TABLET | Freq: Once | ORAL | Status: DC
  Filled 2021-10-22: qty 3

## 2021-10-22 MED ORDER — STROKE: EARLY STAGES OF RECOVERY BOOK
Freq: Once | Status: AC
  Administered 2021-10-22: 1
  Filled 2021-10-22: qty 1

## 2021-10-22 MED ORDER — DICLOFENAC SODIUM 1 % EX GEL: 2.0000 g | Freq: Four times a day (QID) | CUTANEOUS | 2 refills | Status: DC

## 2021-10-22 MED ORDER — IOHEXOL 350 MG/ML SOLN
75.0000 mL | Freq: Once | INTRAVENOUS | Status: AC | PRN
  Administered 2021-10-22: 75 mL via INTRAVENOUS

## 2021-10-22 MED ORDER — ASPIRIN EC 81 MG PO TBEC
243.0000 mg | DELAYED_RELEASE_TABLET | Freq: Once | ORAL | Status: AC
  Administered 2021-10-22: 243 mg via ORAL
  Filled 2021-10-22: qty 3

## 2021-10-22 MED ORDER — CLOPIDOGREL BISULFATE 75 MG PO TABS
300.0000 mg | ORAL_TABLET | Freq: Once | ORAL | Status: AC
  Administered 2021-10-22: 300 mg via ORAL
  Filled 2021-10-22 (×2): qty 4

## 2021-10-22 MED ORDER — ASPIRIN EC 81 MG PO TBEC
81.0000 mg | DELAYED_RELEASE_TABLET | Freq: Every day | ORAL | Status: DC
  Administered 2021-10-22 – 2021-10-23 (×2): 81 mg via ORAL
  Filled 2021-10-22 (×2): qty 1

## 2021-10-22 MED ORDER — ACETAMINOPHEN 325 MG PO TABS: 650.0000 mg | ORAL_TABLET | ORAL | Status: DC | PRN

## 2021-10-22 MED ORDER — DICLOFENAC SODIUM 1 % EX GEL
2.0000 g | Freq: Four times a day (QID) | CUTANEOUS | Status: DC
  Administered 2021-10-22 – 2021-10-23 (×4): 2 g via TOPICAL
  Filled 2021-10-22: qty 100

## 2021-10-22 MED ORDER — ACETAMINOPHEN 160 MG/5ML PO SOLN: 650.0000 mg | ORAL | Status: DC | PRN

## 2021-10-22 MED ORDER — SODIUM CHLORIDE 0.9% FLUSH
3.0000 mL | Freq: Once | INTRAVENOUS | Status: AC
  Administered 2021-10-22: 3 mL via INTRAVENOUS

## 2021-10-22 MED ORDER — CLOPIDOGREL BISULFATE 75 MG PO TABS
75.0000 mg | ORAL_TABLET | Freq: Every day | ORAL | Status: DC
  Administered 2021-10-23: 75 mg via ORAL
  Filled 2021-10-22: qty 1

## 2021-10-22 MED ORDER — ATORVASTATIN CALCIUM 10 MG PO TABS
20.0000 mg | ORAL_TABLET | Freq: Every day | ORAL | Status: DC
Start: 2021-10-23 — End: 2021-10-22

## 2021-10-22 MED ORDER — SODIUM CHLORIDE 0.9 % IV SOLN: INTRAVENOUS | Status: DC

## 2021-10-22 NOTE — ED Notes (Signed)
Patient transported to CT 
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
## 2021-10-22 NOTE — Progress Notes (Signed)
Verified 300 mg plavix dose and 243 mg aspirin dose with both Wynetta Fines MD and pharmacy [facility].  ?
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
## 2021-10-22 NOTE — Consult Note (Incomplete)
Neurology Consultation ?Reason for Consult: *** ?Requesting Physician: *** ? ?CC: *** ? ?History is obtained from:*** ? ?HPI: Douglas Pruitt is a 71 y.o. male *** ? ? ?LKW: *** ?tPA given?: No, or if yes, time given *** ?IA performed?: No, or if yes, groin puncture time: *** ?Premorbid modified rankin scale: *** ?    0 - No symptoms. ?    1 - No significant disability. Able to carry out all usual activities, despite some symptoms. ?    2 - Slight disability. Able to look after own affairs without assistance, but unable to carry out all previous activities. ?    3 - Moderate disability. Requires some help, but able to walk unassisted. ?    4 - Moderately severe disability. Unable to attend to own bodily needs without assistance, and unable to walk unassisted. ?    5 - Severe disability. Requires constant nursing care and attention, bedridden, incontinent. ?    6 - Dead. ?ICH Score: *** ? Time performed: *** ?GCS: {Blank single:19197::"3-4 is 2 points","5-12 is 1 point","13-15 is 0 points"} ?Infratentorial: {yes no:314532}. If yes, 1 point ?Volume: {Blank single:19197::">30cc is 1 point","<30cc is 0 points"}  ?Age: 71 y.o.. >80 is 1 point ?Intraventricular extension is 1 point ? ?Score:*** ? ?A Score of {Blank single:19197::"0 points has a 30 day mortality of 0%","1 points has a 30 day mortality of 13%","2 points has a 30 day mortality of 26%","3 points has a 30 day mortality of 72%","4 points has a 30 day mortality of 97%","5-6 points has a 30 day mortality of 100%"}. Stroke. 2001 Apr;32(4):891-7. ? ? ? ?ROS: All other review of systems was negative except as noted in the HPI. *** Unable to obtain due to altered mental status.  ? ?Past Medical History:  ?Diagnosis Date  ? Hypertension   ? ?*** ? ?History reviewed. No pertinent family history. ?*** ? ?Social History:  reports that he has never smoked. He has never used smokeless tobacco. He reports that he does not drink alcohol and does not use  drugs. ?*** ? ?Exam: ?Current vital signs: ?BP (!) 169/57   Pulse 65   Temp 98.3 ?F (36.8 ?C) (Oral)   Resp 19   Ht 5\' 4"  (1.626 m)   Wt 71.6 kg   SpO2 96%   BMI 27.09 kg/m?  ?Vital signs in last 24 hours: ?Temp:  [98.3 ?F (36.8 ?C)-98.5 ?F (36.9 ?C)] 98.3 ?F (36.8 ?C) (03/27 1522) ?Pulse Rate:  [65-75] 65 (03/27 1523) ?Resp:  [15-31] 19 (03/27 1523) ?BP: (134-187)/(57-92) 169/57 (03/27 1521) ?SpO2:  [95 %-99 %] 96 % (03/27 1523) ?Weight:  [71.6 kg] 71.6 kg (03/27 1154) ? ? ?Physical Exam  ?Constitutional: Appears well-developed and well-nourished.  ?Psych: Affect appropriate to situation, *** ?Eyes: No scleral injection ?HENT: No oropharyngeal obstruction.  ?MSK: no joint deformities.  ?Cardiovascular: Normal rate and regular rhythm. *** Perfusing extremities well ?Respiratory: Effort normal, non-labored breathing ?GI: Soft.  No distension. There is no tenderness.  ?Skin: Warm dry and intact visible skin ? ?Neuro: ?Mental Status: ?Patient is awake, alert, oriented to person, place, month, year, and situation.*** ?Patient is able to give a clear and coherent history.*** ?No signs of aphasia or neglect*** ?Cranial Nerves: ?II: Visual Fields are full. Pupils are equal, round, and reactive to light.  *** ?III,IV, VI: EOMI without ptosis or diploplia.  ?V: Facial sensation is symmetric to temperature ?VII: Facial movement is symmetric.  ?VIII: hearing is intact to voice ?X: Uvula elevates symmetrically ?  XI: Shoulder shrug is symmetric. ?XII: tongue is midline without atrophy or fasciculations.  ?Motor: ?Tone is normal. Bulk is normal. 5/5 strength was present in all four extremities. *** ?Sensory: ?Sensation is symmetric to light touch and temperature in the arms and legs.*** ?Deep Tendon Reflexes: ?2+ and symmetric in the brachioradialis and patellae. *** ?Plantars: ?Toes are downgoing bilaterally. *** ?Cerebellar: ?FNF and HKS are intact bilaterally*** ?Gait:  ?Deferred in acute setting *** ? ?NIHSS total  *** ?Score breakdown: *** ?Performed at *** time of patient arrival to ED  ? ? ?I have reviewed labs in epic and the results pertinent to this consultation are: ?*** ? ?I have reviewed the images obtained:*** ? ? ?Impression: *** ? ?Recommendations: ?# *** stroke ?- Stroke labs TSH, HgbA1c, fasting lipid panel ?- MRI brain  ?- CTA completed as above *** ?- Frequent neuro checks ?- Echocardiogram ?- Carotid dopplers ?- Prophylactic therapy-Antiplatelet med: Aspirin - dose 325mg  PO or 300mg  PR, followed by 81 mg daily ?- Consider Plavix 300 mg load with 75 mg daily for 21 - 90 day course  ?- Risk factor modification ?- Telemetry monitoring; 30 day event monitor on discharge if no arrythmias captured  ?- Blood pressure goal *** ? - Normotension ? - Management of hypertensive emergency/urgency per standard protocols ? - Permissive hypertension to 220/120 due to LVO ? - Post tPA for 24  hours < 180/105 ? - Post successful uncomplicated revascularization SBP 120 - 140 for 24 hours; if complications have arisen or only partial revascularization reach out to interventionalist or neurologist on call for BP goal ?- PT consult, OT consult, Speech consult, unless patient is back to baseline ?- Stroke team to follow ? ? ? ?Lesleigh Noe MD-PhD ?[facility] ?[phone] ? ? ?*** [facility], [facility], Teleneuro  ? ? ?Total critical care time: *** minutes ?  ?Critical care time was exclusive of separately billable procedures and treating other patients. ?  ?Critical care was necessary to treat or prevent imminent or life-threatening deterioration. ?  ?Critical care was time spent personally by me on the following activities: development of treatment plan with patient and/or surrogate as well as nursing, discussions with consultants/primary team, evaluation of patient's response to treatment, examination of patient, obtaining history from patient or surrogate, ordering and performing treatments and interventions, ordering and  review of laboratory studies, ordering and review of radiographic studies, and re-evaluation of patient's condition as needed, as documented above.  ?  ? ?
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
## 2021-10-23 ENCOUNTER — Inpatient Hospital Stay (HOSPITAL_COMMUNITY): Payer: Medicare Other

## 2021-10-23 DIAGNOSIS — Z1389 Encounter for screening for other disorder: Secondary | ICD-10-CM | POA: Diagnosis not present

## 2021-10-23 DIAGNOSIS — I6621 Occlusion and stenosis of right posterior cerebral artery: Secondary | ICD-10-CM | POA: Diagnosis not present

## 2021-10-23 DIAGNOSIS — S061XAA Traumatic cerebral edema with loss of consciousness status unknown, initial encounter: Secondary | ICD-10-CM | POA: Diagnosis not present

## 2021-10-23 DIAGNOSIS — I639 Cerebral infarction, unspecified: Secondary | ICD-10-CM | POA: Diagnosis not present

## 2021-10-23 DIAGNOSIS — I6529 Occlusion and stenosis of unspecified carotid artery: Secondary | ICD-10-CM

## 2021-10-23 DIAGNOSIS — R414 Neurologic neglect syndrome: Secondary | ICD-10-CM | POA: Diagnosis not present

## 2021-10-23 DIAGNOSIS — I6389 Other cerebral infarction: Secondary | ICD-10-CM

## 2021-10-23 DIAGNOSIS — I63511 Cerebral infarction due to unspecified occlusion or stenosis of right middle cerebral artery: Secondary | ICD-10-CM | POA: Diagnosis not present

## 2021-10-23 DIAGNOSIS — I63411 Cerebral infarction due to embolism of right middle cerebral artery: Secondary | ICD-10-CM | POA: Diagnosis not present

## 2021-10-23 LAB — ECHOCARDIOGRAM COMPLETE
AR max vel: 1.23 cm2
AV Area VTI: 1.31 cm2
AV Area mean vel: 1.21 cm2
AV Mean grad: 4 mmHg
AV Peak grad: 6.8 mmHg
Ao pk vel: 1.3 m/s
Area-P 1/2: 3.06 cm2
Height: 64 in
S' Lateral: 2.6 cm
Weight: 2524.81 oz

## 2021-10-23 LAB — LIPID PANEL
Cholesterol: 212 mg/dL — ABNORMAL HIGH (ref 0–200)
HDL: 41 mg/dL (ref >40)
LDL Cholesterol: 142 mg/dL — ABNORMAL HIGH (ref 0–99)
Total CHOL/HDL Ratio: 5.2 RATIO
Triglycerides: 147 mg/dL (ref <150)
VLDL: 29 mg/dL (ref 0–40)

## 2021-10-23 LAB — HEMOGLOBIN A1C
Hgb A1c MFr Bld: 5.6 % (ref 4.8–5.6)
Mean Plasma Glucose: 114.02 mg/dL

## 2021-10-23 IMAGING — DX DG FEMUR 2+V*R*
4 series · 4 of 4 positions shown · non-contrast
Comparison: None.

CLINICAL DATA: Metal screening

EXAM:
RIGHT FEMUR 2 VIEWS

[femur ap (1 of 2)]
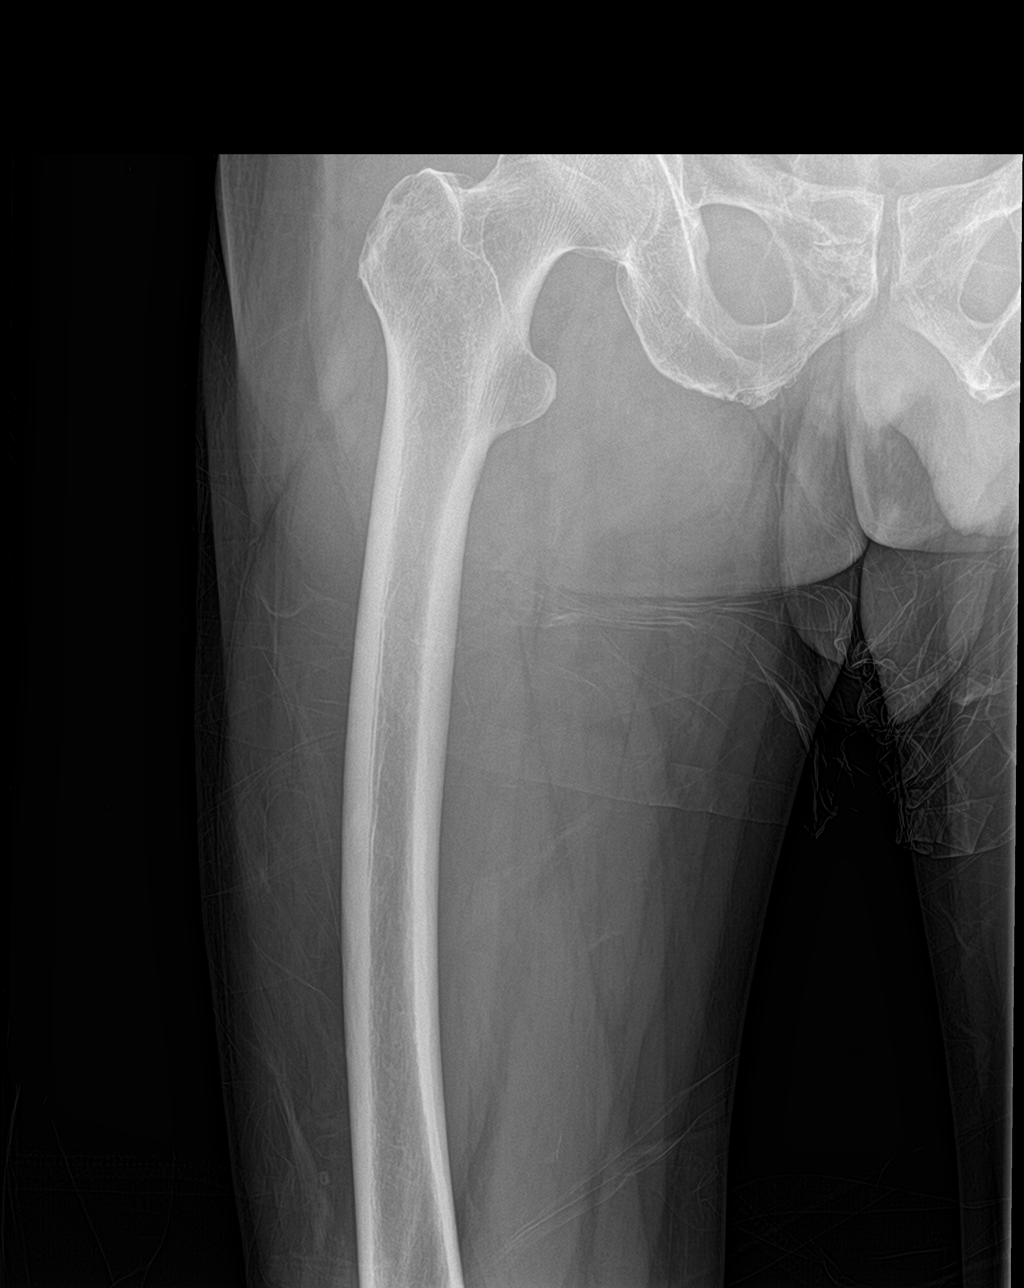

[femur ap (2 of 2)]
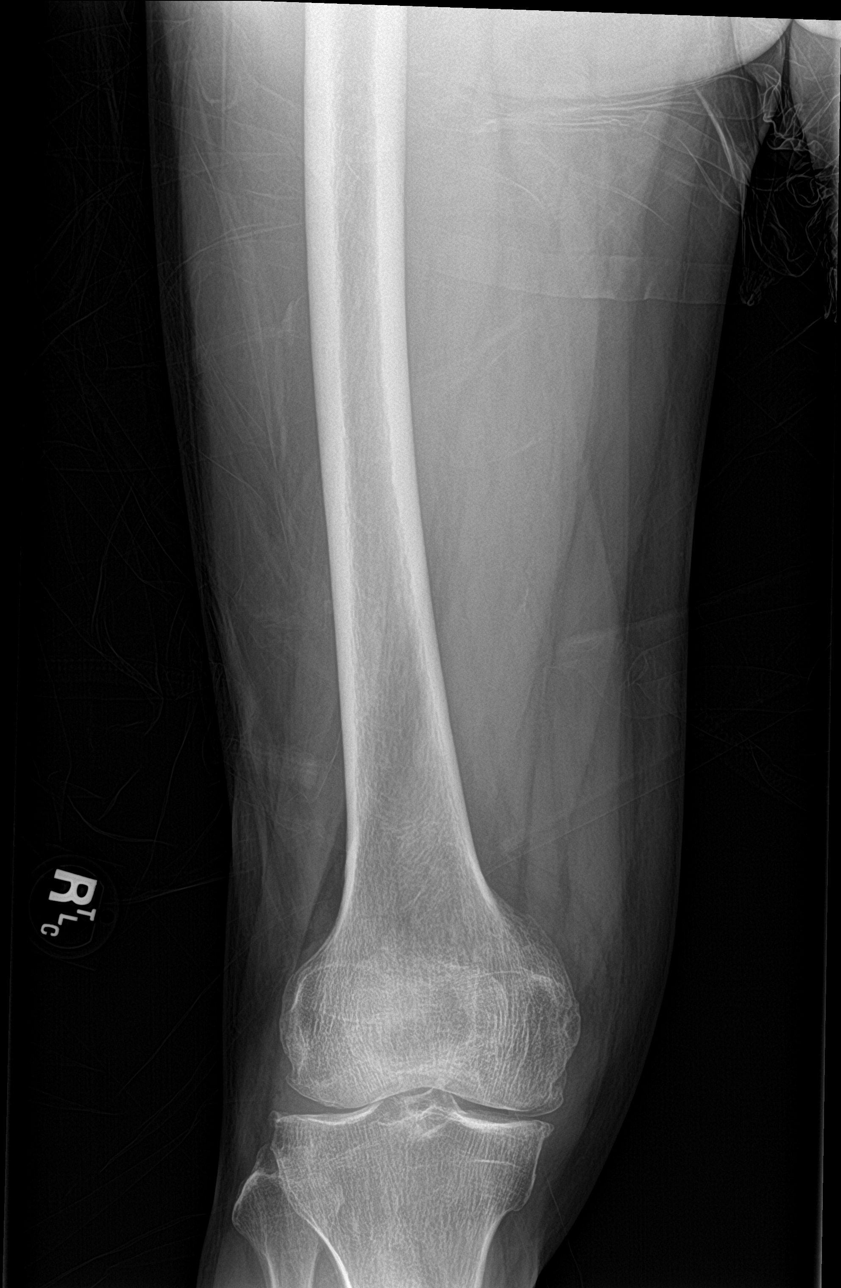

[femur lat (1 of 2)]
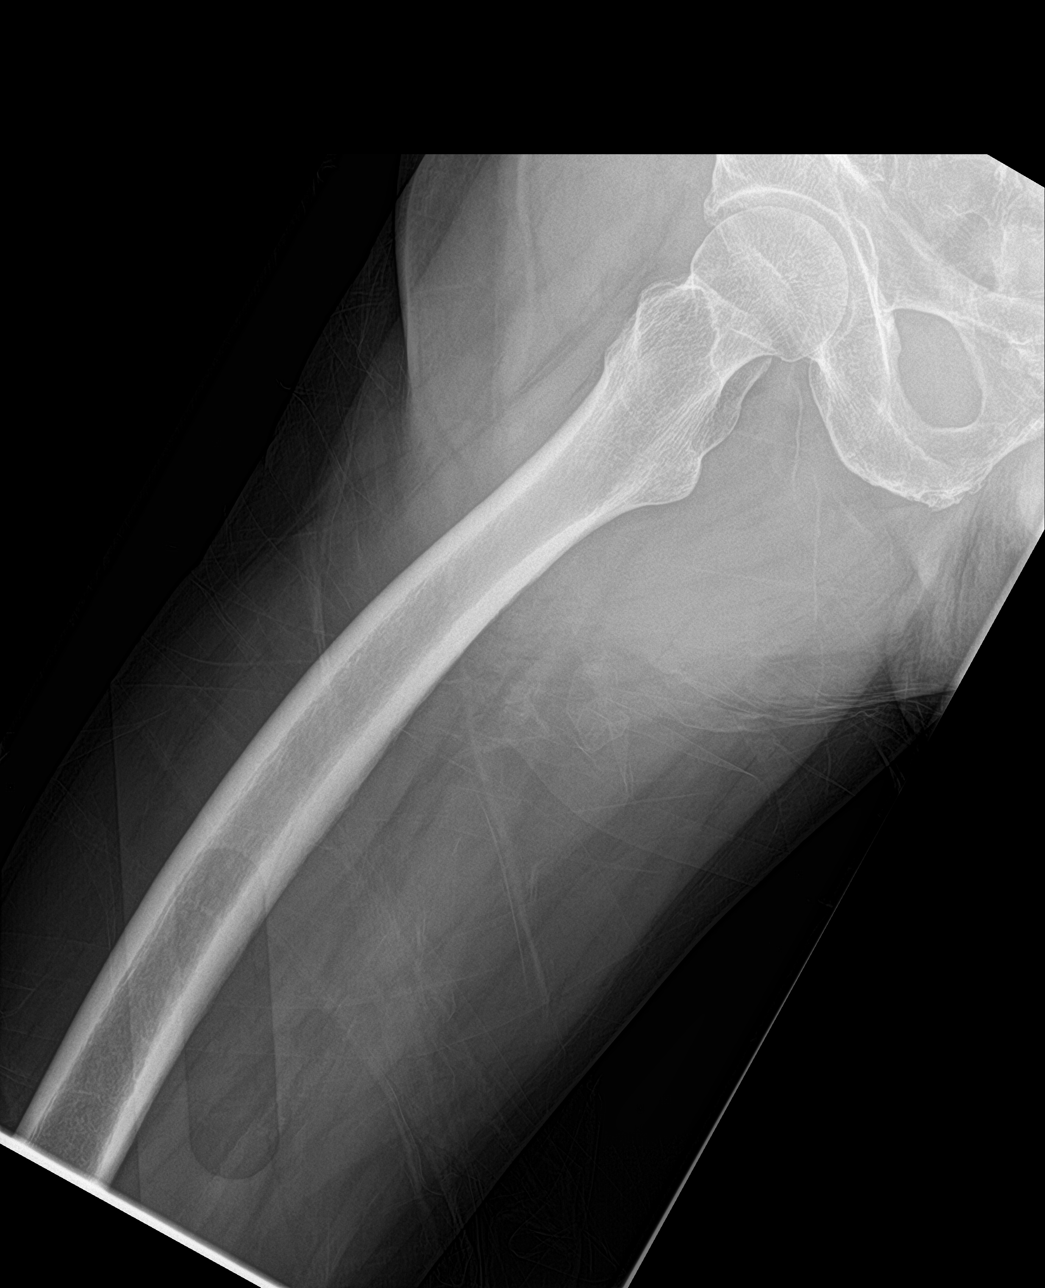

[femur lat (2 of 2)]
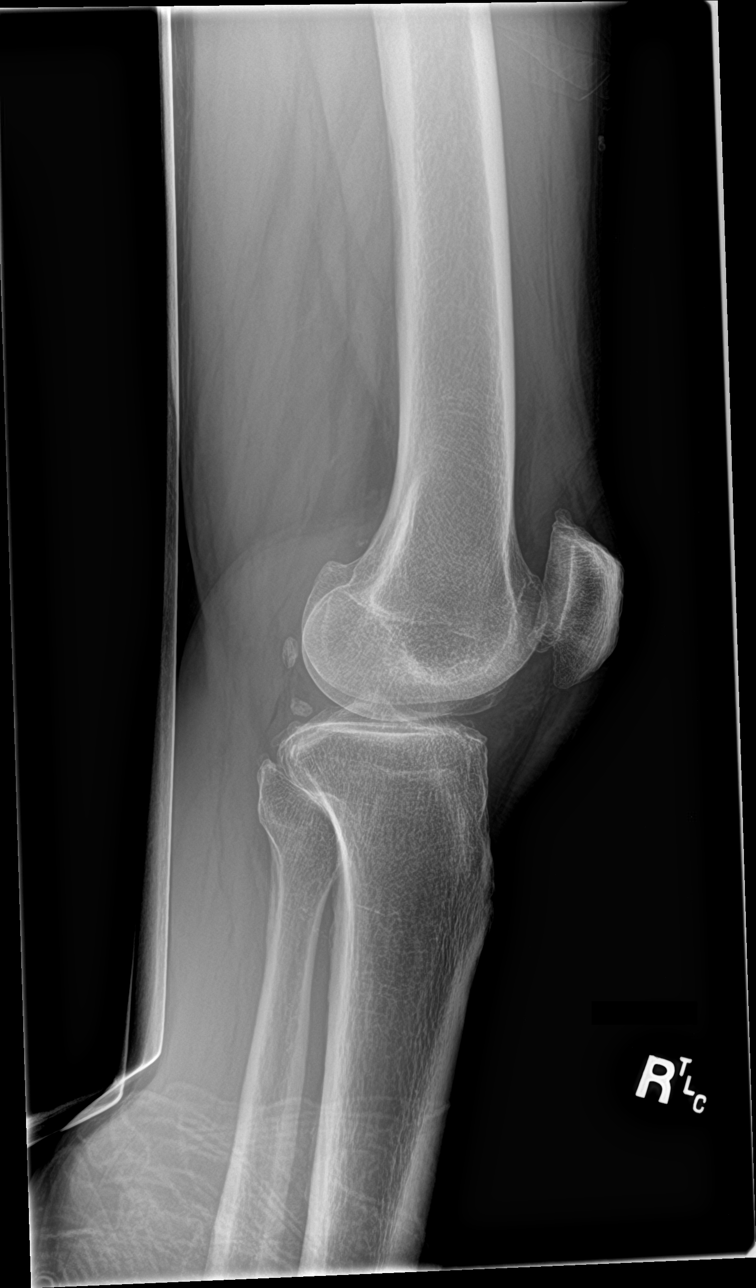

[4 of 4 positions shown; findings below may reference images not displayed]

FINDINGS: There is no evidence of fracture or other focal bone lesions. Soft
tissues are unremarkable. No radiopaque foreign body.
IMPRESSION: No acute osseous abnormality. No radiopaque foreign body.

## 2021-10-23 IMAGING — MR MR HEAD WO/W CM
9 of 13 series · 32 of 48 positions shown · IV contrast (gadavist)
Comparison: CT head and CTA head and neck yesterday.

CLINICAL DATA: 70-year-old male code stroke presentation. Right ICA
occlusion in the neck on CTA yesterday.

EXAM:
MRI HEAD WITHOUT AND WITH CONTRAST
TECHNIQUE: Multiplanar, multiecho pulse sequences of the brain and surrounding
structures were obtained without and with intravenous contrast.
CONTRAST:  7mL GADAVIST GADOBUTROL 1 MMOL/ML IV SOLN

[Series 3: DWI · axial · 3.0mm · 1.09mm/px · z∈[-70,+83]mm · 8 of 104 slices shown (1 of 4)]
[im 1/104]
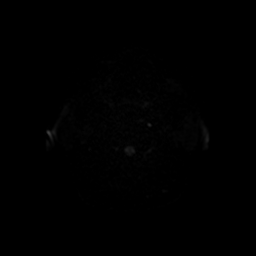
[im 15/104]
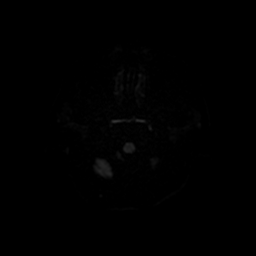
[im 30/104]
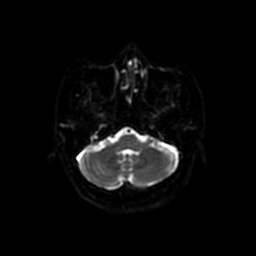
[im 45/104]
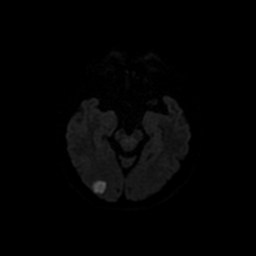
[im 59/104]
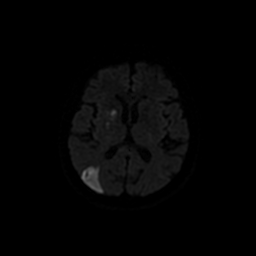
[im 74/104]
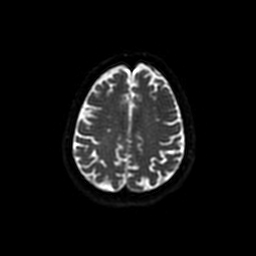
[im 89/104]
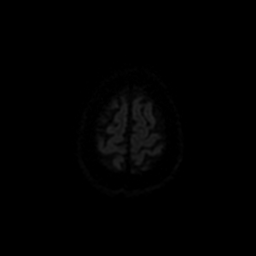
[im 104/104]
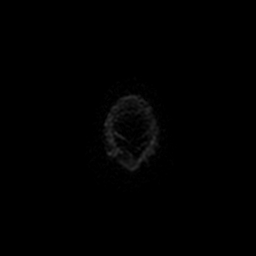

[Series 4: DWI · coronal · 5.0mm · 1.09mm/px · 5 of 68 slices shown (2 of 4)]
[im 1/68]
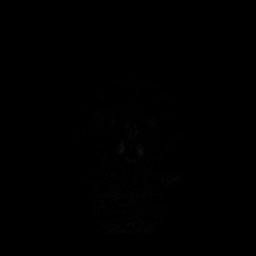
[im 17/68]
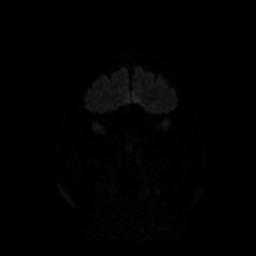
[im 34/68]
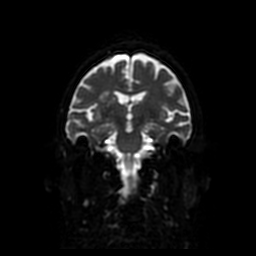
[im 51/68]
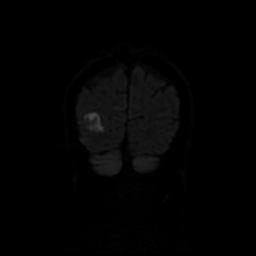
[im 68/68]
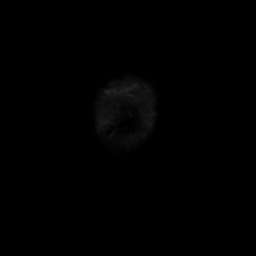

[Series 6: T2 · axial · 5.0mm · 0.43mm/px · z∈[-60,+78]mm · 2 of 24 slices shown (1 of 2)]
[im 1/24]
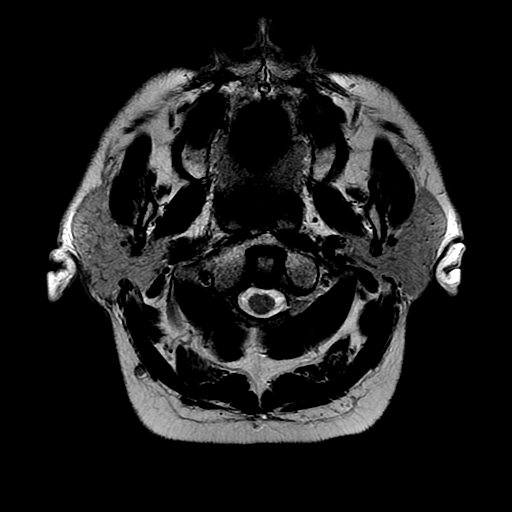
[im 24/24]
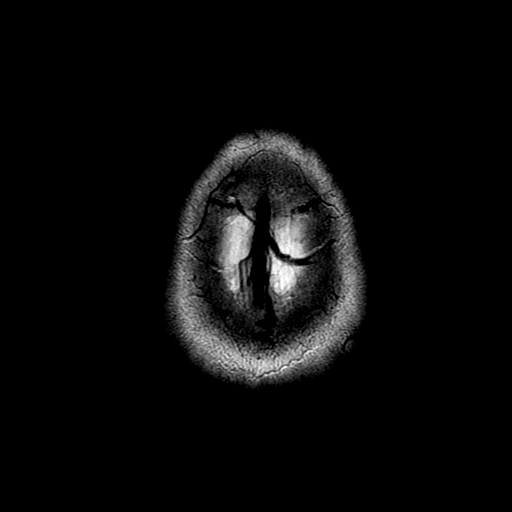

[Series 7: FLAIR · axial · 3.0mm · 0.43mm/px · z∈[-60,+78]mm · 2 of 24 slices shown (1 of 2)]
[im 1/24]
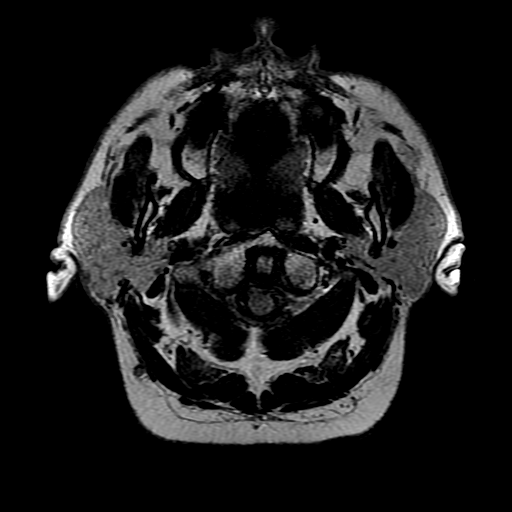
[im 24/24]
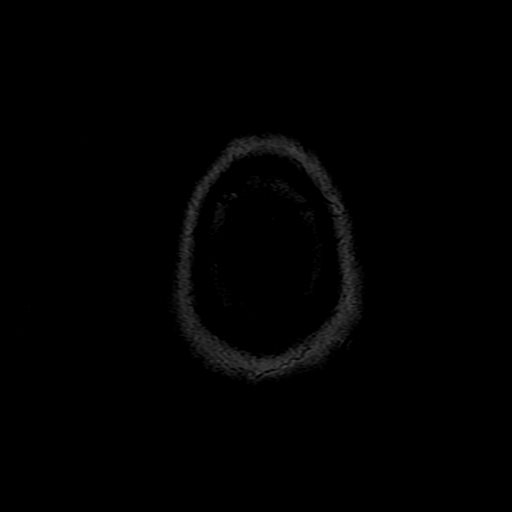

[Series 8: T2 · coronal · 3.0mm · 0.37mm/px · 1 of 31 slices shown (2 of 2)]
[im 1/31]
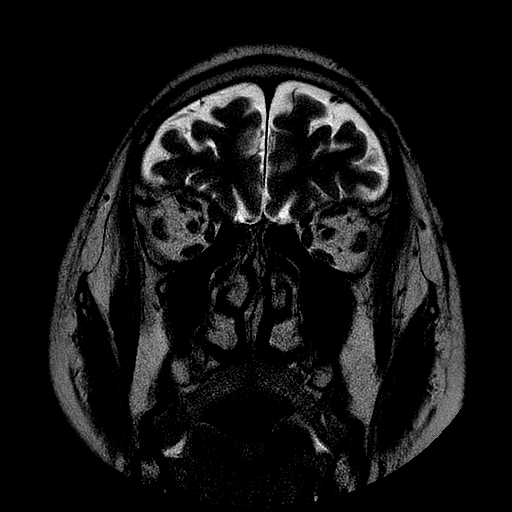

[Series 9: FLAIR · coronal · 3.0mm · 0.37mm/px · 3 of 31 slices shown (2 of 2)]
[im 1/31]
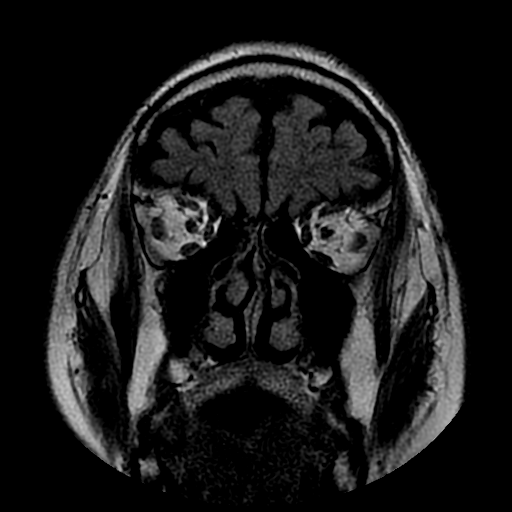
[im 16/31]
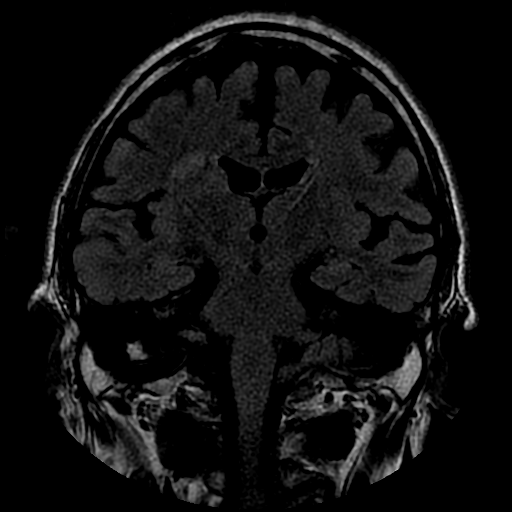
[im 31/31]
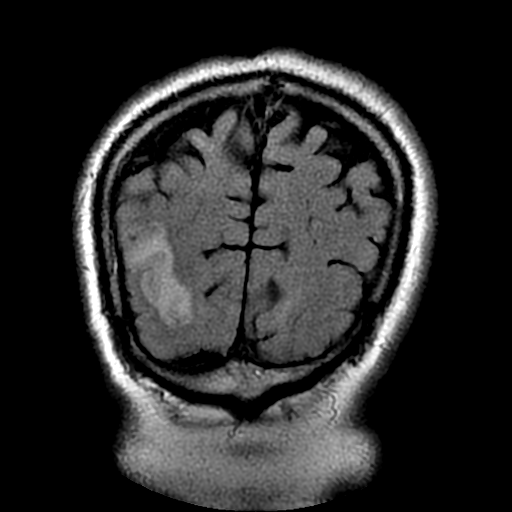

[Series 13: T1 post-contrast · axial · 3.0mm · 0.43mm/px · z∈[-65,+82]mm · 4 of 50 slices shown]
[im 1/50]
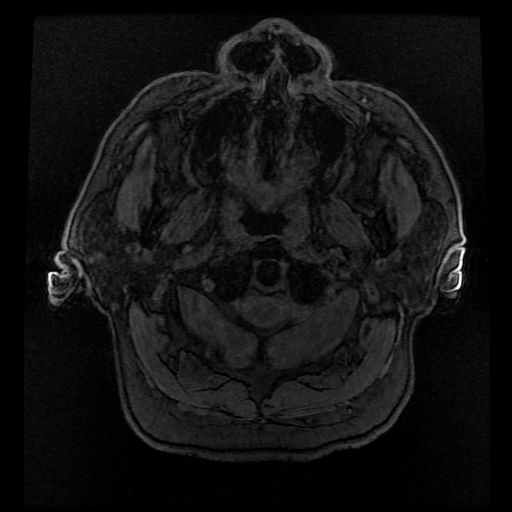
[im 17/50]
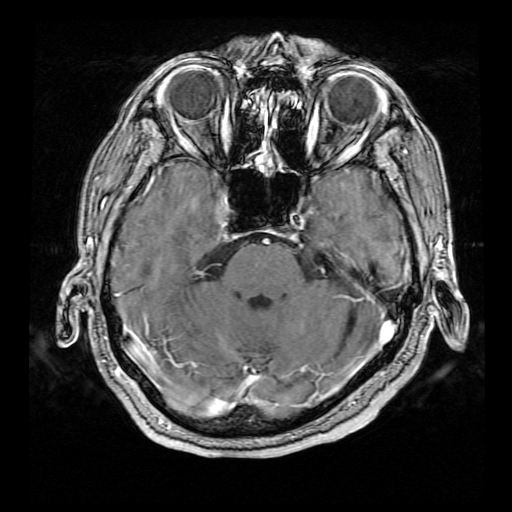
[im 33/50]
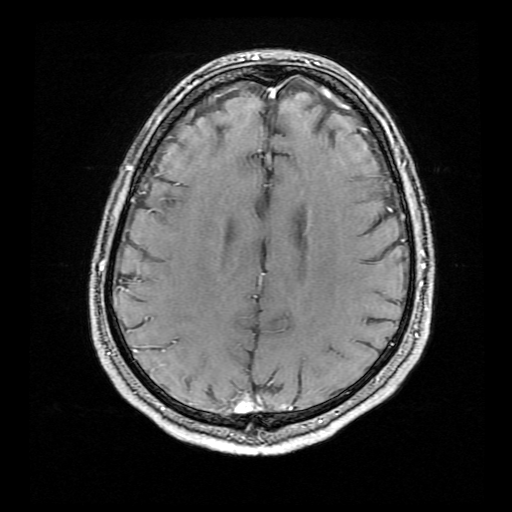
[im 50/50]
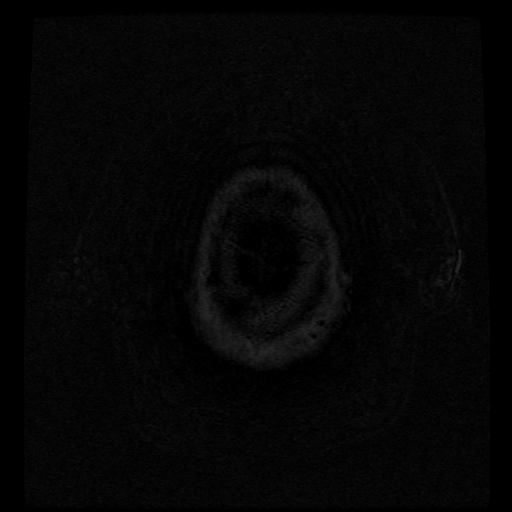

[Series 300: DWI · axial · 3.0mm · 1.09mm/px · z∈[-70,+83]mm · 4 of 52 slices shown (3 of 4)]
[im 1/52]
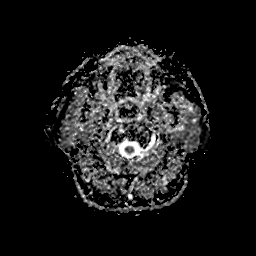
[im 18/52]
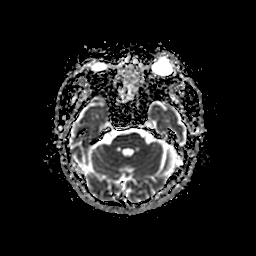
[im 35/52]
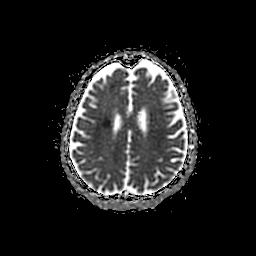
[im 52/52]
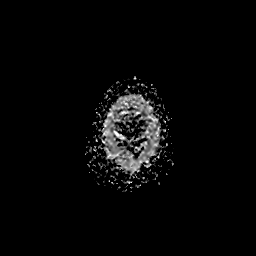

[Series 400: DWI · coronal · 5.0mm · 1.09mm/px · 3 of 34 slices shown (4 of 4)]
[im 1/34]
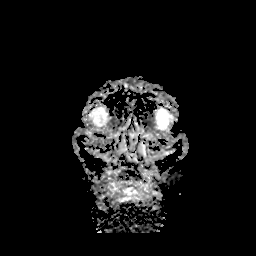
[im 17/34]
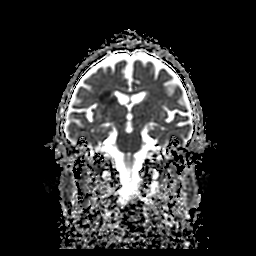
[im 34/34]
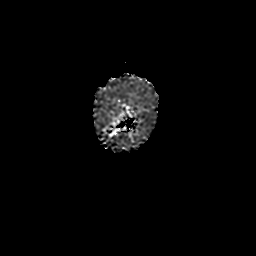

[32 of 48 positions shown; findings below may reference images not displayed]

FINDINGS: Brain: Multifocal right hemisphere restricted diffusion, including
Patchy and confluent involvement of the right corona radiata, right
basal ganglia, posteroinferior right parietal lobe tracking into the
superior right occipital lobe medial to the parieto-occipital
sulcus. There is also evidence of acute on chronic ischemia in the
posterior right splenium (series 3, image 31). Occasional other tiny
foci of restricted diffusion including some subcortical white
matter. Associated cytotoxic edema appears fairly well-developed
with T2 and FLAIR hyperintensity, T1 hypointensity. No evidence of
associated hemorrhage. No significant mass effect.

No contralateral left hemisphere or posterior fossa restricted
diffusion or age advanced signal changes. No midline shift, mass
effect, evidence of mass lesion, ventriculomegaly, extra-axial
collection or acute intracranial hemorrhage. Cervicomedullary
junction and pituitary are within normal limits. No definite chronic
cerebral blood products.

Thin slice coronal images demonstrate sparing of the hippocampal
formations and mesial temporal lobes which appear symmetric and
within normal limits.

Vascular: Major intracranial vascular flow voids are stable relative
to the CTA yesterday, with loss of the normal right ICA flow void
through the cavernous segment (series 6, image 10). Some evidence of
reconstituted flow at the right ICA terminus.

Skull and upper cervical spine: Negative for age visible cervical
spine. Visualized bone marrow signal is within normal limits.

Sinuses/Orbits: Postoperative changes to the left globe. Otherwise
negative orbits. Paranasal sinuses and mastoids are stable and well
aerated.

Other: Grossly normal visible internal auditory structures. Negative
visible scalp and face.
IMPRESSION: 1. Acute to subacute infarcts in the Right MCA and Right MCA/PCA
watershed territories. Confluent involvement of the right corona
radiata and basal ganglia. Cytotoxic edema with no associated
hemorrhage or significant mass effect.

2. Evidence of occluded right ICA at the skull base concordant with
CTA yesterday.

## 2021-10-23 MED ORDER — DICLOFENAC SODIUM 1 % EX GEL: 2.0000 g | Freq: Four times a day (QID) | CUTANEOUS | 2 refills | Status: DC

## 2021-10-23 MED ORDER — GADOBUTROL 1 MMOL/ML IV SOLN
7.0000 mL | Freq: Once | INTRAVENOUS | Status: AC | PRN
  Administered 2021-10-23: 7 mL via INTRAVENOUS

## 2021-10-23 MED ORDER — LISINOPRIL 5 MG PO TABS: 5.0000 mg | ORAL_TABLET | Freq: Every day | ORAL | 1 refills | Status: DC

## 2021-10-23 MED ORDER — ATORVASTATIN CALCIUM 40 MG PO TABS: 40.0000 mg | ORAL_TABLET | Freq: Every day | ORAL | 0 refills | Status: DC

## 2021-10-23 MED ORDER — CLOPIDOGREL BISULFATE 75 MG PO TABS: 75.0000 mg | ORAL_TABLET | Freq: Every day | ORAL | 0 refills | Status: DC

## 2021-10-23 MED ORDER — ASPIRIN 81 MG PO TBEC: 81.0000 mg | DELAYED_RELEASE_TABLET | Freq: Every day | ORAL | 2 refills | Status: AC

## 2021-10-23 MED ORDER — AMLODIPINE BESYLATE 5 MG PO TABS: 5.0000 mg | ORAL_TABLET | Freq: Every day | ORAL | 1 refills | Status: DC

## 2021-10-23 NOTE — Progress Notes (Signed)
OT Cancellation Note ? ?Patient Details ?Name: Douglas Pruitt ?MRN: [id number] ?DOB: 1950/08/28 ? ? ?Cancelled Treatment:    Reason Eval/Treat Not Completed: Patient at procedure or test/ unavailable Off unit for MRI. Will follow-up as schedule permits for OT eval. ? ?Lorre Munroe ?10/23/2021, 10:54 AM ?

## 2021-10-23 NOTE — Progress Notes (Signed)
Discharged to home after IV access removed and discharge instructions reviewed with pt and daughter.  All questions answered.   ?

## 2021-10-23 NOTE — Assessment & Plan Note (Signed)
Permissive hypertension at this time.  Resume lisinopril tomorrow, will add amlodipine given significantly elevated BP's here, suspect he'll need additional uptitration of meds ?

## 2021-10-23 NOTE — Assessment & Plan Note (Signed)
lipitor ?LDL 142 ?
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
## 2021-10-23 NOTE — Progress Notes (Signed)
Telemetry called patient has ST elevation in Vlead measures 2.3. Denies any chest pain or discomfort and sleeps good as translated by her daughter thru phone call. MD notified. Stat EKG done.Will repeat EKG after 2 hours. Will continue to monitor. ?

## 2021-10-23 NOTE — Evaluation (Signed)
Physical Therapy Evaluation ?Patient Details ?Name: Douglas Pruitt ?MRN: [id number] ?DOB: 12/11/1950 ?Today's Date: 10/23/2021 ? ?History of Present Illness ? Vinicio Carrero is a 71 y.o. 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 CT revealed R parietal CVA and possible R corona radiata and R basal ganglia stroke. ?  ?Clinical Impression ? Pt functioning near baseline. Pt with residual L eye vision impairment from previous injury. Pt mobilizing at supervision level, mild R LE antalgia from R knee joint pain per daughter. Daughter, [name], was interpreter for patient. Acute PT to cont to follow pt as medical plan pending and pt could undergo several procedures.    ?   ? ?Recommendations for follow up therapy are one component of a multi-disciplinary discharge planning process, led by the attending physician.  Recommendations may be updated based on patient status, additional functional criteria and insurance authorization. ? ?Follow Up Recommendations No PT follow up ? ?  ?Assistance Recommended at Discharge PRN  ?Patient can return home with the following ? Assist for transportation ? ?  ?Equipment Recommendations None recommended by PT  ?Recommendations for Other Services ?    ?  ?Functional Status Assessment Patient has had a recent decline in their functional status and demonstrates the ability to make significant improvements in function in a reasonable and predictable amount of time.  ? ?  ?Precautions / Restrictions Precautions ?Precautions: Fall ?Precaution Comments: L visual impairment from previou injury in 2022 ?Restrictions ?Weight Bearing Restrictions: No  ? ?  ? ?Mobility ? Bed Mobility ?Overal bed mobility: Modified Independent ?  ?  ?  ?  ?  ?  ?General bed mobility comments: no use of bed rail, hob at 14 deg, no physical assist ?  ? ?Transfers ?Overall transfer level: Needs  assistance ?Equipment used: None ?Transfers: Sit to/from Stand ?Sit to Stand: Min guard ?  ?  ?  ?  ?  ?General transfer comment: min guard for safety for first tim eup ?  ? ?Ambulation/Gait ?Ambulation/Gait assistance: Min guard ?Gait Distance (Feet): 300 Feet ?Assistive device: None ?Gait Pattern/deviations: Step-through pattern, Decreased stride length, Trunk flexed, Wide base of support, Trendelenburg ?Gait velocity: wfl ?Gait velocity interpretation: >2.62 ft/sec, indicative of community ambulatory ?  ?General Gait Details: pt min guard due to antalgia in R knee from "joint pain" however no episode of LOB, pt with genu varum and wide base of support ? ?Stairs ?Stairs: Yes ?Stairs assistance: Min guard ?Stair Management: One rail Right, Alternating pattern, Forwards ?Number of Stairs: 5 ?General stair comments: no LOB, steady ? ?Wheelchair Mobility ?  ? ?Modified Rankin (Stroke Patients Only) ?Modified Rankin (Stroke Patients Only) ?Pre-Morbid Rankin Score: No significant disability ?Modified Rankin: No significant disability ? ?  ? ?Balance Overall balance assessment: Mild deficits observed, not formally tested ?  ?  ?  ?  ?  ?  ?  ?  ?  ?  ?  ?  ?  ?  ?  ?  ?  ?  ?   ? ? ? ?Pertinent Vitals/Pain Pain Assessment ?Pain Assessment: Faces ?Faces Pain Scale: Hurts a little bit ?Pain Location: R knee joint pain  ? ? ?Home Living Family/patient expects to be discharged to:: Private residence ?Living Arrangements: Children (dtr, son in law and grandchildren) ?Available Help at Discharge: Family;Friend(s);Available 24 hours/day ?Type of Home: House ?Home Access: Stairs to enter ?Entrance Stairs-Rails: None ?  ?  ?  Home Layout: One level ?Home Equipment: None ?   ?  ?Prior Function Prior Level of Function : Independent/Modified Independent;Working/employed;Driving ?  ?  ?  ?  ?  ?  ?Mobility Comments: indep, driving, working in [employer] ?ADLs Comments: indep ?  ? ? ?Hand Dominance  ? Dominant Hand: Right ? ?   ?Extremity/Trunk Assessment  ? Upper Extremity Assessment ?Upper Extremity Assessment: Defer to OT evaluation ?  ? ?Lower Extremity Assessment ?Lower Extremity Assessment: Generalized weakness ?  ? ?Cervical / Trunk Assessment ?Cervical / Trunk Assessment: Normal  ?Communication  ? Communication: Prefers language other than English  ?Cognition Arousal/Alertness: Awake/alert ?Behavior During Therapy: [facility] for tasks assessed/performed ?Overall Cognitive Status: Within Functional Limits for tasks assessed ?  ?  ?  ?  ?  ?  ?  ?  ?  ?  ?  ?  ?  ?  ?  ?  ?  ?  ?  ? ?  ?General Comments General comments (skin integrity, edema, etc.): VSS, residual L vision deficits from previous injury ? ?  ?Exercises    ? ?Assessment/Plan  ?  ?PT Assessment Patient needs continued PT services  ?PT Problem List Decreased strength;Decreased activity tolerance;Decreased mobility;Decreased knowledge of use of DME ? ?   ?  ?PT Treatment Interventions DME instruction;Gait training;Stair training;Therapeutic activities;Functional mobility training;Therapeutic exercise;Balance training;Neuromuscular re-education   ? ?PT Goals (Current goals can be found in the Care Plan section)  ?Acute Rehab PT Goals ?Patient Stated Goal: didn't state ?PT Goal Formulation: With patient/family ?Time For Goal Achievement: 11/06/21 ?Potential to Achieve Goals: Good ? ?  ?Frequency Min 3X/week ?  ? ? ?Co-evaluation   ?  ?  ?  ?  ? ? ?  ?AM-PAC PT "6 Clicks" Mobility  ?Outcome Measure Help needed turning from your back to your side while in a flat bed without using bedrails?: None ?Help needed moving from lying on your back to sitting on the side of a flat bed without using bedrails?: None ?Help needed moving to and from a bed to a chair (including a wheelchair)?: None ?Help needed standing up from a chair using your arms (e.g., wheelchair or bedside chair)?: A Little ?Help needed to walk in hospital room?: A Little ?Help needed climbing 3-5 steps with a railing? :  A Little ?6 Click Score: 21 ? ?  ?End of Session Equipment Utilized During Treatment: Gait belt ?Activity Tolerance: Patient tolerated treatment well ?Patient left: in chair;with call bell/phone within reach;with family/visitor present;with nursing/sitter in room ?Nurse Communication: Mobility status (pt safe to amb in room to bathroom) ?PT Visit Diagnosis: Unsteadiness on feet (R26.81) ?  ? ?Time: 1208-1219 ?PT Time Calculation (min) (ACUTE ONLY): 11 min ? ? ?Charges:   PT Evaluation ?$PT Eval Low Complexity: 1 Low ?  ?  ?   ? ? ?Lewis Shock, PT, DPT ?Acute Rehabilitation Services ?Pager #: [phone] ?Office #: [phone] ? ? ?Yitzchok Carriger M Jeylin Woodmansee ?10/23/2021, 1:00 PM ? ?

## 2021-10-23 NOTE — Assessment & Plan Note (Signed)
chronic

## 2021-10-23 NOTE — Plan of Care (Signed)
?  Problem: Education: ?Goal: Knowledge of General Education information will improve ?Description: Including pain rating scale, medication(s)/side effects and non-pharmacologic comfort measures ?Outcome: Adequate for Discharge ?  ?Problem: Health Behavior/Discharge Planning: ?Goal: Ability to manage health-related needs will improve ?Outcome: Adequate for Discharge ?  ?Problem: Clinical Measurements: ?Goal: Ability to maintain clinical measurements within normal limits will improve ?Outcome: Adequate for Discharge ?Goal: Will remain free from infection ?Outcome: Adequate for Discharge ?Goal: Diagnostic test results will improve ?Outcome: Adequate for Discharge ?Goal: Respiratory complications will improve ?Outcome: Adequate for Discharge ?Goal: Cardiovascular complication will be avoided ?Outcome: Adequate for Discharge ?  ?Problem: Activity: ?Goal: Risk for activity intolerance will decrease ?Outcome: Adequate for Discharge ?  ?Problem: Nutrition: ?Goal: Adequate nutrition will be maintained ?Outcome: Adequate for Discharge ?  ?Problem: Coping: ?Goal: Level of anxiety will decrease ?Outcome: Adequate for Discharge ?  ?Problem: Elimination: ?Goal: Will not experience complications related to bowel motility ?Outcome: Adequate for Discharge ?Goal: Will not experience complications related to urinary retention ?Outcome: Adequate for Discharge ?  ?Problem: Pain Managment: ?Goal: General experience of comfort will improve ?Outcome: Adequate for Discharge ?  ?Problem: Safety: ?Goal: Ability to remain free from injury will improve ?Outcome: Adequate for Discharge ?  ?Problem: Skin Integrity: ?Goal: Risk for impaired skin integrity will decrease ?Outcome: Adequate for Discharge ?  ?Problem: Education: ?Goal: Knowledge of disease or condition will improve ?Outcome: Adequate for Discharge ?Goal: Knowledge of secondary prevention will improve (SELECT ALL) ?Outcome: Adequate for Discharge ?Goal: Knowledge of patient specific  risk factors will improve (INDIVIDUALIZE FOR PATIENT) ?Outcome: Adequate for Discharge ?Goal: Individualized Educational Video(s) ?Outcome: Adequate for Discharge ?  ?Problem: Self-Care: ?Goal: Ability to participate in self-care as condition permits will improve ?Outcome: Adequate for Discharge ?Goal: Verbalization of feelings and concerns over difficulty with self-care will improve ?Outcome: Adequate for Discharge ?Goal: Ability to communicate needs accurately will improve ?Outcome: Adequate for Discharge ?  ?Problem: Nutrition: ?Goal: Risk of aspiration will decrease ?Outcome: Adequate for Discharge ?  ?Problem: Intracerebral Hemorrhage Tissue Perfusion: ?Goal: Complications of Intracerebral Hemorrhage will be minimized ?Outcome: Adequate for Discharge ?  ?Problem: Ischemic Stroke/TIA Tissue Perfusion: ?Goal: Complications of ischemic stroke/TIA will be minimized ?Outcome: Adequate for Discharge ?  ?

## 2021-10-23 NOTE — Evaluation (Signed)
Occupational Therapy Evaluation ?Patient Details ?Name: Douglas Pruitt ?MRN: [id number] ?DOB: 08/29/1950 ?Today's Date: 10/23/2021 ? ? ?History of Present Illness Douglas Pruitt is a 71 y.o. 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 CT revealed R parietal CVA and possible R corona radiata and R basal ganglia stroke.  ? ?Clinical Impression ?  ?PTA, pt lives with family, active at baseline and works full time in [employer]. Pt presents now fairly close to baseline for ADLs/mobility with minor deficits d/t baseline L eye visual impairment and R knee discomfort. Initially, min guard provided for mobility to bathroom without AD with pt progressing to independence with increased activity. Pt Independent for ADLs. Will continue to follow acutely for UE strengthening and endurance for daily tasks, as well as monitoring any pending procedures that may impact functioning. Anticipate no OT needs at DC.   ?   ? ?Recommendations for follow up therapy are one component of a multi-disciplinary discharge planning process, led by the attending physician.  Recommendations may be updated based on patient status, additional functional criteria and insurance authorization.  ? ?Follow Up Recommendations ? No OT follow up  ?  ?Assistance Recommended at Discharge PRN  ?Patient can return home with the following Assist for transportation ? ?  ?Functional Status Assessment ? Patient has had a recent decline in their functional status and demonstrates the ability to make significant improvements in function in a reasonable and predictable amount of time.  ?Equipment Recommendations ? None recommended by OT  ?  ?Recommendations for Other Services   ? ? ?  ?Precautions / Restrictions Precautions ?Precautions: Fall ?Precaution Comments: L visual impairment from previous injury in 2022 ?Restrictions ?Weight Bearing  Restrictions: No  ? ?  ? ?Mobility Bed Mobility ?  ?  ?  ?  ?  ?  ?  ?General bed mobility comments: received sitting EOB ?  ? ?Transfers ?Overall transfer level: Needs assistance ?Equipment used: None ?Transfers: Sit to/from Stand ?Sit to Stand: Min guard ?  ?  ?  ?  ?  ?General transfer comment: min guard for safety for first tim eup ?  ? ?  ?Balance Overall balance assessment: Mild deficits observed, not formally tested ?  ?  ?  ?  ?  ?  ?  ?  ?  ?  ?  ?  ?  ?  ?  ?  ?  ?  ?   ? ?ADL either performed or assessed with clinical judgement  ? ?ADL Overall ADL's : Independent ?  ?  ?  ?  ?  ?  ?  ?  ?  ?  ?  ?  ?  ?  ?  ?  ?  ?  ?  ?General ADL Comments: initially min guard for mobility to bathroom without AD though improving with continued mobility. able to perform toileting task, hand hygiene and throw away paper towel without assist (minor cues for locating trash can, etc due to baseline vision impairment)  ? ? ? ?Vision Baseline Vision/History: 1 Wears glasses ?Ability to See in Adequate Light: 2 Moderately impaired (baseline L eye impairments) ?Patient Visual Report: No change from baseline ?Vision Assessment?: Vision impaired- to be further tested in functional context ?Additional Comments: baseline L eye vision impairments (reports spotty vision)  ?   ?Perception   ?  ?Praxis   ?  ? ?Pertinent Vitals/Pain  Pain Assessment ?Pain Assessment: Faces ?Faces Pain Scale: Hurts a little bit ?Pain Location: R knee joint pain ?Pain Intervention(s): Monitored during session  ? ? ? ?Hand Dominance Right ?  ?Extremity/Trunk Assessment Upper Extremity Assessment ?Upper Extremity Assessment: Overall WFL for tasks assessed ?  ?Lower Extremity Assessment ?Lower Extremity Assessment: Defer to PT evaluation ?  ?Cervical / Trunk Assessment ?Cervical / Trunk Assessment: Normal ?  ?Communication Communication ?Communication: Prefers language other than English ?  ?Cognition Arousal/Alertness: Awake/alert ?Behavior During Therapy: [facility]  for tasks assessed/performed ?Overall Cognitive Status: Within Functional Limits for tasks assessed ?  ?  ?  ?  ?  ?  ?  ?  ?  ?  ?  ?  ?  ?  ?  ?  ?General Comments: per daughter, pt appears at baseline cognitively though slower to respond ?  ?  ?General Comments  daughter present and assisted with interpreting ? ?  ?Exercises   ?  ?Shoulder Instructions    ? ? ?Home Living Family/patient expects to be discharged to:: Private residence ?Living Arrangements: Children (dtr, son in law and grandchildren) ?Available Help at Discharge: Family;Friend(s);Available 24 hours/day ?Type of Home: House ?Home Access: Stairs to enter ?  ?Entrance Stairs-Rails: None ?Home Layout: One level ?  ?  ?Bathroom Shower/Tub: Walk-in shower ?  ?Bathroom Toilet: Handicapped height ?Bathroom Accessibility: Yes ?  ?Home Equipment: None ?  ?  ?  ? ?  ?Prior Functioning/Environment Prior Level of Function : Independent/Modified Independent;Working/employed;Driving ?  ?  ?  ?  ?  ?  ?Mobility Comments: indep, driving, working in [employer] ?ADLs Comments: indep ?  ? ?  ?  ?OT Problem List: Impaired balance (sitting and/or standing);Decreased strength ?  ?   ?OT Treatment/Interventions: Self-care/ADL training;Therapeutic exercise;Energy conservation;Therapeutic activities  ?  ?OT Goals(Current goals can be found in the care plan section) Acute Rehab OT Goals ?Patient Stated Goal: to walk ?OT Goal Formulation: With patient ?Time For Goal Achievement: 11/06/21 ?Potential to Achieve Goals: Good ?ADL Goals ?Pt/caregiver will Perform Home Exercise Program: Increased strength;Both right and left upper extremity;With theraband;With written HEP provided;Independently ?Additional ADL Goal #1: Pt to increase activity tolerance > 15 min during ADL/IADL tasks  ?OT Frequency: Min 2X/week ?  ? ?Co-evaluation   ?  ?  ?  ?  ? ?  ?AM-PAC OT "6 Clicks" Daily Activity     ?Outcome Measure Help from another person eating meals?: None ?Help from another person  taking care of personal grooming?: None ?Help from another person toileting, which includes using toliet, bedpan, or urinal?: None ?Help from another person bathing (including washing, rinsing, drying)?: None ?Help from another person to put on and taking off regular upper body clothing?: None ?Help from another person to put on and taking off regular lower body clothing?: None ?6 Click Score: 24 ?  ?End of Session Equipment Utilized During Treatment: Gait belt ?Nurse Communication: Mobility status ? ?Activity Tolerance: Patient tolerated treatment well ?Patient left: Other (comment) (to ambulate with PT) ? ?OT Visit Diagnosis: Unsteadiness on feet (R26.81)  ?              ?Time: 4132-4401 ?OT Time Calculation (min): 11 min ?Charges:  OT General Charges ?$OT Visit: 1 Visit ?OT Evaluation ?$OT Eval Low Complexity: 1 Low ? ?Bradd Canary, OTR/L ?Acute Rehab Services ?Office: [phone]  ? ?Lorre Munroe ?10/23/2021, 1:28 PM ?
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
## 2021-10-23 NOTE — Plan of Care (Signed)
?  Problem: Education: ?Goal: Knowledge of disease or condition will improve ?Outcome: Progressing ?Goal: Knowledge of secondary prevention will improve (SELECT ALL) ?Outcome: Progressing ?  ?Problem: Nutrition: ?Goal: Risk of aspiration will decrease ?Outcome: Progressing ?  ?
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
## 2021-10-23 NOTE — Progress Notes (Signed)
?  Echocardiogram ?2D Echocardiogram has been performed. ? ?Jibri, Schriefer ?10/23/2021, 4:25 PM ?
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
## 2021-10-23 NOTE — Assessment & Plan Note (Addendum)
Concern for R carotid occlusion ?Patient declined diagnostic cerebral catheter angiogram ?Recommend follow outpatient with neurology ?

## 2021-10-24 ENCOUNTER — Telehealth: Payer: Self-pay

## 2021-10-24 NOTE — Telephone Encounter (Signed)
Transition Care Management Follow-up Telephone Call ?Date of discharge and from where: 10/23/2021 [facility] ?How have you been since you were released from the hospital? Pt daughter states he has been good, he has ?Any questions or concerns? No ? ?Items Reviewed: ?Did the pt receive and understand the discharge instructions provided? Yes  ?Medications obtained and verified? Yes  ?Other? Yes  ?Any new allergies since your discharge? No  ?Dietary orders reviewed? Yes ?Do you have support at home? Yes  ? ?Home Care and Equipment/Supplies: ?Were home health services ordered? no ?If so, what is the name of the agency? N/a  ?Has the agency set up a time to come to the patient's home? no ?Were any new equipment or medical supplies ordered?  No ?What is the name of the medical supply agency? N/a ?Were you able to get the supplies/equipment? no ?Do you have any questions related to the use of the equipment or supplies? No ? ?Functional Questionnaire: (I = Independent and D = Dependent) ?ADLs: i ? ?Bathing/Dressing- i ? ?Meal Prep- i ? ?Eating- i ? ?Maintaining continence- i ? ?Transferring/Ambulation- i ? ?Managing Meds- i ? ?Follow up appointments reviewed: ? ?PCP Hospital f/u appt confirmed? Yes  Scheduled to see janece moore on n/a @ n/a. ?[facility] f/u appt confirmed? No  Scheduled to see n/a on n/a @ n/a. ?Are transportation arrangements needed? No  ?If their condition worsens, is the pt aware to call PCP or go to the Emergency Dept.? Yes ?Was the patient provided with contact information for the PCP's office or ED? Yes ?Was to pt encouraged to call back with questions or concerns? Yes  ?

## 2021-10-25 ENCOUNTER — Ambulatory Visit: Payer: Medicare Other

## 2021-10-29 ENCOUNTER — Encounter: Payer: Self-pay | Admitting: *Deleted

## 2021-10-29 ENCOUNTER — Emergency Department (HOSPITAL_COMMUNITY): Payer: Medicare Other

## 2021-10-29 ENCOUNTER — Encounter (HOSPITAL_COMMUNITY): Payer: Self-pay

## 2021-10-29 ENCOUNTER — Other Ambulatory Visit: Payer: Self-pay | Admitting: *Deleted

## 2021-10-29 ENCOUNTER — Other Ambulatory Visit: Payer: Self-pay

## 2021-10-29 ENCOUNTER — Inpatient Hospital Stay (HOSPITAL_COMMUNITY)
Admission: EM | Admit: 2021-10-29 | Discharge: 2021-11-01 | DRG: 065 | Disposition: A | Discharge status: Rehabilitation Facility | Payer: Medicare Other | Attending: Internal Medicine | Admitting: Internal Medicine

## 2021-10-29 DIAGNOSIS — R531 Weakness: Secondary | ICD-10-CM | POA: Diagnosis not present

## 2021-10-29 DIAGNOSIS — E871 Hypo-osmolality and hyponatremia: Secondary | ICD-10-CM | POA: Diagnosis not present

## 2021-10-29 DIAGNOSIS — E663 Overweight: Secondary | ICD-10-CM | POA: Diagnosis present

## 2021-10-29 DIAGNOSIS — R079 Chest pain, unspecified: Secondary | ICD-10-CM | POA: Diagnosis present

## 2021-10-29 DIAGNOSIS — M25552 Pain in left hip: Secondary | ICD-10-CM | POA: Diagnosis not present

## 2021-10-29 DIAGNOSIS — I6523 Occlusion and stenosis of bilateral carotid arteries: Secondary | ICD-10-CM | POA: Diagnosis not present

## 2021-10-29 DIAGNOSIS — E876 Hypokalemia: Secondary | ICD-10-CM | POA: Diagnosis not present

## 2021-10-29 DIAGNOSIS — R17 Unspecified jaundice: Secondary | ICD-10-CM | POA: Diagnosis present

## 2021-10-29 DIAGNOSIS — I63231 Cerebral infarction due to unspecified occlusion or stenosis of right carotid arteries: Secondary | ICD-10-CM | POA: Diagnosis not present

## 2021-10-29 DIAGNOSIS — Z7982 Long term (current) use of aspirin: Secondary | ICD-10-CM

## 2021-10-29 DIAGNOSIS — I6623 Occlusion and stenosis of bilateral posterior cerebral arteries: Secondary | ICD-10-CM | POA: Diagnosis not present

## 2021-10-29 DIAGNOSIS — Z7902 Long term (current) use of antithrombotics/antiplatelets: Secondary | ICD-10-CM | POA: Diagnosis not present

## 2021-10-29 DIAGNOSIS — K5900 Constipation, unspecified: Secondary | ICD-10-CM | POA: Diagnosis not present

## 2021-10-29 DIAGNOSIS — Z66 Do not resuscitate: Secondary | ICD-10-CM | POA: Diagnosis not present

## 2021-10-29 DIAGNOSIS — Z043 Encounter for examination and observation following other accident: Secondary | ICD-10-CM | POA: Diagnosis not present

## 2021-10-29 DIAGNOSIS — R3911 Hesitancy of micturition: Secondary | ICD-10-CM | POA: Diagnosis not present

## 2021-10-29 DIAGNOSIS — E782 Mixed hyperlipidemia: Secondary | ICD-10-CM | POA: Diagnosis not present

## 2021-10-29 DIAGNOSIS — I63033 Cerebral infarction due to thrombosis of bilateral carotid arteries: Secondary | ICD-10-CM | POA: Diagnosis not present

## 2021-10-29 DIAGNOSIS — S7002XA Contusion of left hip, initial encounter: Secondary | ICD-10-CM | POA: Diagnosis not present

## 2021-10-29 DIAGNOSIS — I6389 Other cerebral infarction: Principal | ICD-10-CM

## 2021-10-29 DIAGNOSIS — W19XXXA Unspecified fall, initial encounter: Secondary | ICD-10-CM | POA: Diagnosis present

## 2021-10-29 DIAGNOSIS — I6529 Occlusion and stenosis of unspecified carotid artery: Secondary | ICD-10-CM | POA: Diagnosis present

## 2021-10-29 DIAGNOSIS — H5462 Unqualified visual loss, left eye, normal vision right eye: Secondary | ICD-10-CM | POA: Diagnosis present

## 2021-10-29 DIAGNOSIS — I69354 Hemiplegia and hemiparesis following cerebral infarction affecting left non-dominant side: Secondary | ICD-10-CM

## 2021-10-29 DIAGNOSIS — I6521 Occlusion and stenosis of right carotid artery: Secondary | ICD-10-CM | POA: Diagnosis not present

## 2021-10-29 DIAGNOSIS — I69392 Facial weakness following cerebral infarction: Secondary | ICD-10-CM | POA: Diagnosis not present

## 2021-10-29 DIAGNOSIS — I63511 Cerebral infarction due to unspecified occlusion or stenosis of right middle cerebral artery: Secondary | ICD-10-CM | POA: Diagnosis not present

## 2021-10-29 DIAGNOSIS — Z6827 Body mass index (BMI) 27.0-27.9, adult: Secondary | ICD-10-CM

## 2021-10-29 DIAGNOSIS — R471 Dysarthria and anarthria: Secondary | ICD-10-CM | POA: Diagnosis not present

## 2021-10-29 DIAGNOSIS — I639 Cerebral infarction, unspecified: Secondary | ICD-10-CM | POA: Diagnosis present

## 2021-10-29 DIAGNOSIS — E669 Obesity, unspecified: Secondary | ICD-10-CM | POA: Diagnosis not present

## 2021-10-29 DIAGNOSIS — Z79899 Other long term (current) drug therapy: Secondary | ICD-10-CM

## 2021-10-29 DIAGNOSIS — I10 Essential (primary) hypertension: Secondary | ICD-10-CM | POA: Diagnosis not present

## 2021-10-29 DIAGNOSIS — Z20822 Contact with and (suspected) exposure to covid-19: Secondary | ICD-10-CM | POA: Diagnosis not present

## 2021-10-29 DIAGNOSIS — R001 Bradycardia, unspecified: Secondary | ICD-10-CM | POA: Diagnosis not present

## 2021-10-29 DIAGNOSIS — E785 Hyperlipidemia, unspecified: Secondary | ICD-10-CM | POA: Diagnosis not present

## 2021-10-29 DIAGNOSIS — D72829 Elevated white blood cell count, unspecified: Secondary | ICD-10-CM

## 2021-10-29 DIAGNOSIS — G4700 Insomnia, unspecified: Secondary | ICD-10-CM | POA: Diagnosis not present

## 2021-10-29 DIAGNOSIS — I6602 Occlusion and stenosis of left middle cerebral artery: Secondary | ICD-10-CM | POA: Diagnosis not present

## 2021-10-29 LAB — COMPREHENSIVE METABOLIC PANEL
ALT: 33 U/L (ref 0–44)
AST: 44 U/L — ABNORMAL HIGH (ref 15–41)
Albumin: 3.8 g/dL (ref 3.5–5.0)
Alkaline Phosphatase: 63 U/L (ref 38–126)
Anion gap: 13 (ref 5–15)
BUN: 18 mg/dL (ref 8–23)
CO2: 22 mmol/L (ref 22–32)
Calcium: 8.8 mg/dL — ABNORMAL LOW (ref 8.9–10.3)
Chloride: 95 mmol/L — ABNORMAL LOW (ref 98–111)
Creatinine, Ser: 0.79 mg/dL (ref 0.61–1.24)
GFR, Estimated: >60 mL/min (ref >60)
Glucose, Bld: 124 mg/dL — ABNORMAL HIGH (ref 70–99)
Potassium: 3.3 mmol/L — ABNORMAL LOW (ref 3.5–5.1)
Sodium: 130 mmol/L — ABNORMAL LOW (ref 135–145)
Total Bilirubin: 1.4 mg/dL — ABNORMAL HIGH (ref 0.3–1.2)
Total Protein: 8.1 g/dL (ref 6.5–8.1)

## 2021-10-29 LAB — CBC WITH DIFFERENTIAL/PLATELET
Abs Immature Granulocytes: 0.05 10*3/uL (ref 0.00–0.07)
Basophils Absolute: 0 10*3/uL (ref 0.0–0.1)
Basophils Relative: 0 %
Eosinophils Absolute: 0 10*3/uL (ref 0.0–0.5)
Eosinophils Relative: 0 %
HCT: 45.4 % (ref 39.0–52.0)
Hemoglobin: 15.9 g/dL (ref 13.0–17.0)
Immature Granulocytes: 0 %
Lymphocytes Relative: 26 %
Lymphs Abs: 3.9 10*3/uL (ref 0.7–4.0)
MCH: 29.8 pg (ref 26.0–34.0)
MCHC: 35 g/dL (ref 30.0–36.0)
MCV: 85.2 fL (ref 80.0–100.0)
Monocytes Absolute: 1.1 10*3/uL — ABNORMAL HIGH (ref 0.1–1.0)
Monocytes Relative: 7 %
Neutro Abs: 10.3 10*3/uL — ABNORMAL HIGH (ref 1.7–7.7)
Neutrophils Relative %: 67 %
Platelets: 202 10*3/uL (ref 150–400)
RBC: 5.33 MIL/uL (ref 4.22–5.81)
RDW: 12.7 % (ref 11.5–15.5)
WBC: 15.4 10*3/uL — ABNORMAL HIGH (ref 4.0–10.5)
nRBC: 0 % (ref 0.0–0.2)

## 2021-10-29 LAB — TROPONIN I (HIGH SENSITIVITY): Troponin I (High Sensitivity): 8 ng/L (ref <18)

## 2021-10-29 IMAGING — CR DG CHEST 2V
2 series · 2 of 2 positions shown · non-contrast
Comparison: Chest x-ray [DATE].

CLINICAL DATA: Fall.

EXAM:
CHEST - 2 VIEW

[chest lat]
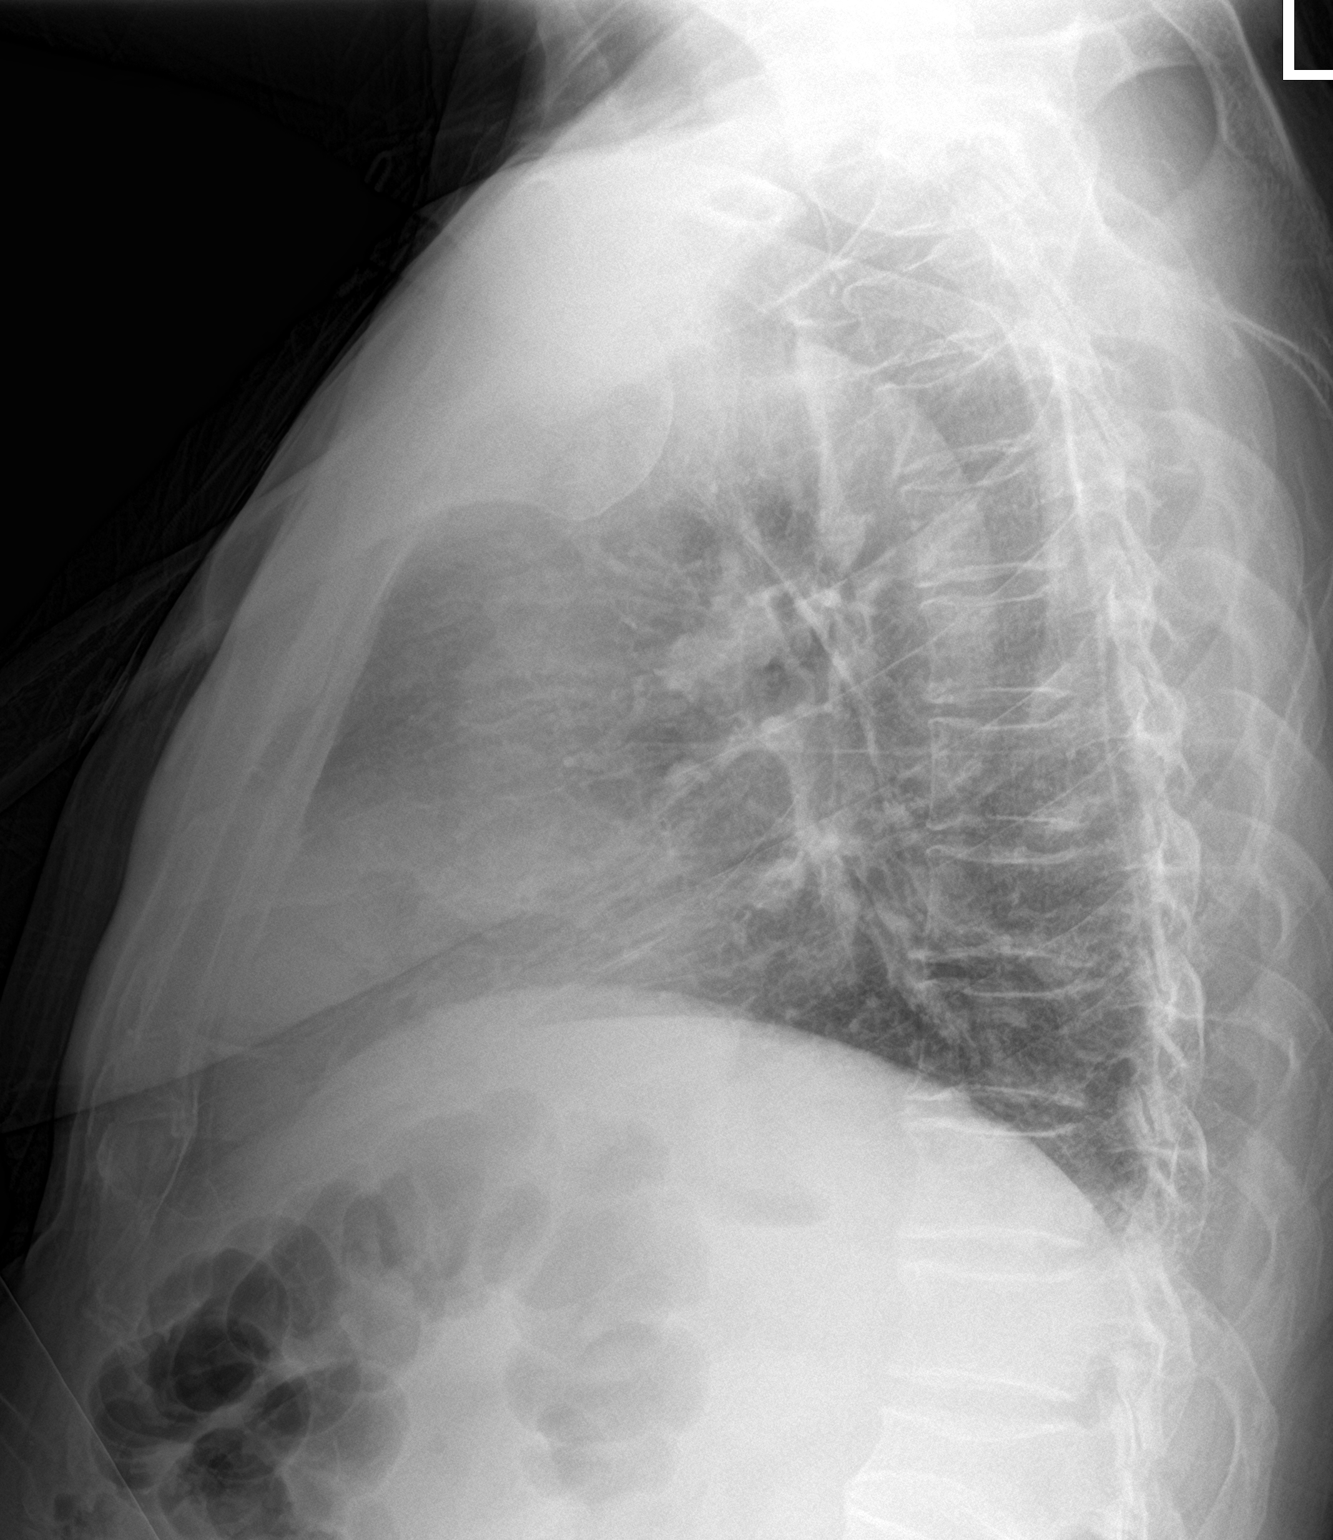

[chest ap]
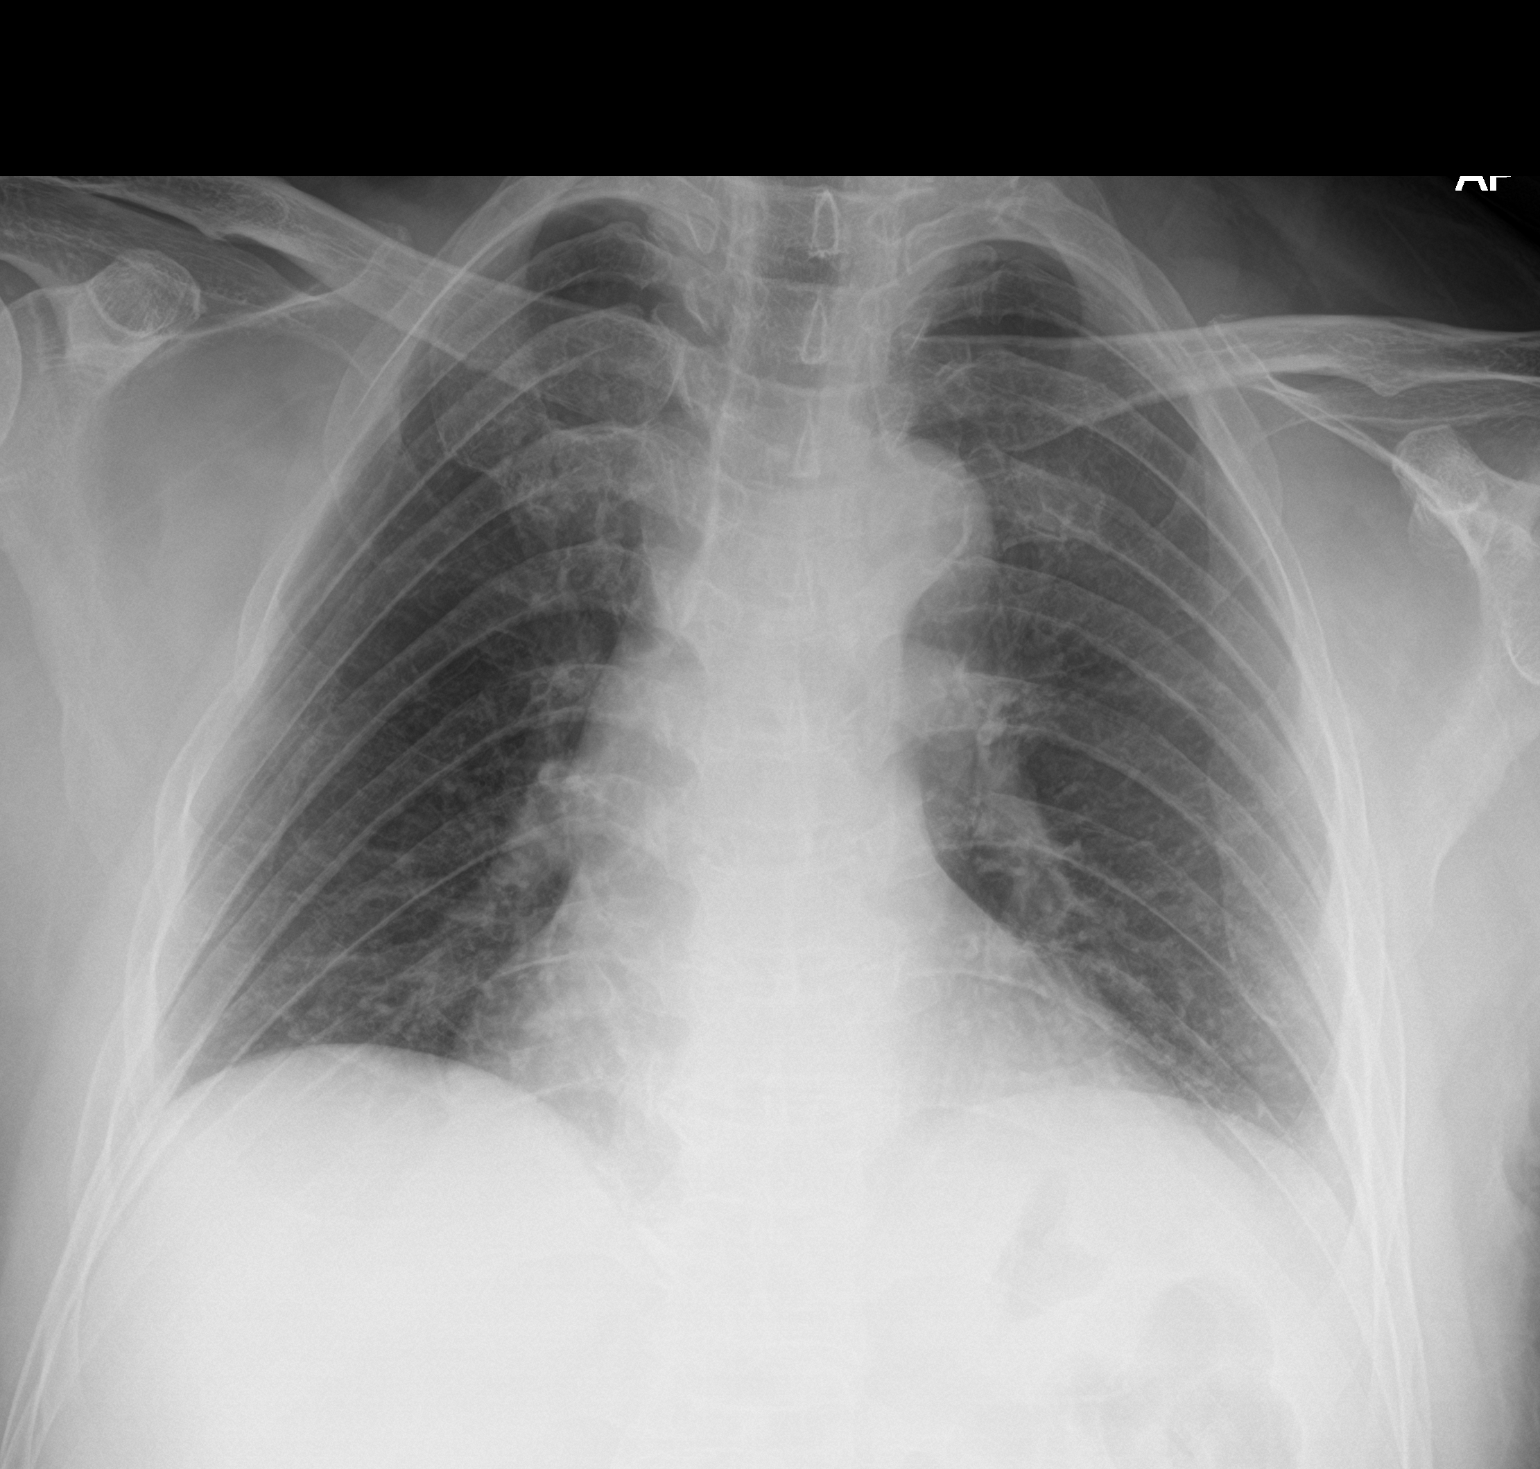

[2 of 2 positions shown; findings below may reference images not displayed]

FINDINGS: The heart size and mediastinal contours are within normal limits.
Both lungs are clear. The visualized skeletal structures are
unremarkable.
IMPRESSION: No active cardiopulmonary disease.

## 2021-10-29 NOTE — Patient Outreach (Signed)
[facility]) Care Management ?Telephonic RN Care Manager Note ? ? ?10/31/2021 ?Name:  Douglas Pruitt MRN:  [id number] DOB:  19-Dec-1950 ? ?Summary: ?Initial unsuccessful outreach No answer. [facility] RN CM left HIPAA [facility] Portability and Accountability Act) compliant English voicemail message along with CM's contact info at the preferred number indicated in EPIC  ?RN CM outreached to Douglas Pruitt for assistance but Reace interpreter for Douglas Pruitt was not available ? ?Douglas Pruitt, Douglas Pruitt returned a call to RN CM  ?She confirms the EMMI stroke responses are correct ?On 10/26/21 Douglas Pruitt began to notice worsening symptoms related to mobility - less mobility, moving left arm & left leg less ?She reports he is experiencing more stress related to less mobility  ?She reports his appetite is poor & he is just tired and sleepy  ?He is alert and oriented  ?His Pruitt interpreted for orientation  ? ? ?His Pruitt ventilated her feeling about Douglas Pruitt wanting to leave the hospital " earlier than he should have" She reports she feels this may have been related to her mother passing at the hospital when she preferred to be at home. She reports she has little ones and she stays busy with them. She feels he may be a little stressed related to being back at home and with decrease mobility.  ? ?Hypertension  ?His Pruitt is monitoring the blood pressure (BP) in the home   ?SBP range 140-160 ?Has not check cbg Reports not glucose meter in the home  ?During this outreach the BP =152/87 heart rate 70  ?Left eye injury prior to stroke per Pruitt ?Pruitt spoke with pcp office staff on today 10/29/21 and they are preferring to continue to monitor patient at home and attend the 11/01/21 pcp visit  ?Pruitt confirmed if symptoms worsen as discussed she will seek EMS services  ? ?Recommendations/Changes made from today's visit: ?Assessed for EMMI red alert Assessed for worsening symptoms ?Reviewed stroke  worsening symptoms to monitor for, Reviewed the action plan for CVA ?Discussed the assessed stroke symptoms noted today weakening mobility of left side since discharge, elevation in SBP, fatigue, encouraged medical services ?Assessed status of HTN & DM  ?Reviewed hospital cbg, BP values with Pruitt Last HgA1c 5.6 ?Recommend checking cbg, discussed elevations in cbg & BP values related to fatigue, stroke ?Provided 24 hour nurse line and RN CM availability  ?Encouraged seeking emergency services for worsening chest pain, headache, elevations in BP > 180 ?Referral e-mail to external CM (pcp) vendor ? ?Subjective: ?Douglas Pruitt is Douglas Pruitt 71 y.o. year old male who is a primary patient of Douglas, [name], FNP. The care management team was consulted for assistance with care management and/or care coordination needs.   ?Douglas Pruitt was referred to [facility] on 10/29/21 for EMMI stroke red alert for Friday 10/26/21 10 am Day #1 responses   ?Feeling worse overall? Yes  ?New problems walking/talking/speaking/seeing? Yes ?He was at [facility] 10/22/21 to 10/23/21 ? ?Telephonic RN Care Manager completed Telephone Visit today.  ? ?Objective: ? ?Medications Reviewed Today   ? ? Reviewed by Delories Heinz, CPhT (Pharmacy Technician) on 10/30/21 at 0422  Med List Status: Complete  ? ?Medication Order Taking? Sig Documenting Provider Last Dose Status Informant  ?acetaminophen (TYLENOL) 325 MG tablet [id number] Yes Take 650 mg by mouth every 6 (six) hours as needed for moderate pain or headache. [provider] Past Week Active Child  ?amLODipine (NORVASC) 5 MG tablet [id number] Yes Take 1  tablet (5 mg total) by mouth daily. Zigmund Daniel., MD 10/29/2021 Active Child  ?aspirin EC 81 MG EC tablet [id number] Yes Take 1 tablet (81 mg total) by mouth daily. Swallow whole. Zigmund Daniel., MD 10/29/2021 Active Child  ?atorvastatin (LIPITOR) 40 MG tablet [id number] Yes Take 1 tablet (40 mg total) by mouth at bedtime. Zigmund Daniel., MD 10/29/2021 Active Child  ?clopidogrel (PLAVIX) 75 MG tablet [id number] Yes Take 1 tablet (75 mg total) by mouth daily. Take with aspirin for 90 days, then take aspirin alone. Zigmund Daniel., MD 10/29/2021 Active Child  ?diclofenac Sodium (VOLTAREN) 1 % GEL [id number] Yes Apply 2 g topically 4 (four) times daily.  ?Patient taking differently: Apply 2 g topically 2 (two) times daily as needed (knees).  ? Zigmund Daniel., MD 10/29/2021 Active Child  ?Lidocaine [facility] PAIN RELIEVING EX) [id number] Yes Apply 2 patches topically daily as needed (left hip pain). [provider] 10/29/2021 Active Child  ?lisinopril (ZESTRIL) 5 MG tablet [id number] Yes Take 1 tablet (5 mg total) by mouth daily. Zigmund Daniel., MD 10/29/2021 Active Child  ? ?  ?  ? ?  ? ?Past Medical History:  ?Diagnosis Date  ? CVA (cerebral vascular accident) (HCC) 10/22/2021  ? Hyperlipidemia   ? Hypertension   ? ? ? ?SDOH:  (Social Determinants of Health) assessments and interventions performed:  ?SDOH Interventions   ? ?Flowsheet Row Most Recent Value  ?SDOH Interventions   ?Food Insecurity Interventions Intervention Not Indicated  ?Housing Interventions Intervention Not Indicated  ?Intimate Partner Violence Interventions Intervention Not Indicated  ?Social Connections Interventions Intervention Not Indicated  ?Transportation Interventions Intervention Not Indicated  ? ?  ? ? ?Care Plan ? ?Review of patient past medical history, allergies, medications, health status, including review of consultants reports, laboratory and other test data, was performed as part of comprehensive evaluation for care management services.  ? ?There are no care plans that you recently modified to display for this patient. ?  ? ?Plan: The patient has been provided with contact information for the care management team and has been advised to call with any health related questions or concerns.  ?The care management team will reach out to the patient  again over the next 7+ business  days. ? ?Merla Sawka L. Noelle Penner, RN, BSN, CCM ?[facility] Telephonic Care Management Care Coordinator ?Office number [phone] ?Main [facility] number [phone] ?Fax number [phone] ? ? ? ? ? ?

## 2021-10-29 NOTE — ED Triage Notes (Signed)
Pt is here today due to sob , cp. Per daughter pt has h/o stroke. Daughter reports recent fall. Denies any loc or hitting head. ?

## 2021-10-29 NOTE — Patient Outreach (Signed)
Received a red flag Emmi stroke notification for Mr. Riddles . ?I have assigned Marval Regal, RN to call for follow up and determine if there are any Case Management needs.  ?  ?Iverson Alamin, CBCS, CMAA ?[facility] Care Management Assistant ?[facility] Care Management ?[phone]   ?

## 2021-10-29 NOTE — ED Provider Triage Note (Signed)
Emergency Medicine Provider Triage Evaluation Note ? ?Douglas Pruitt , a 71 y.o. male  was evaluated in triage.  Pt complains of multiple complaints.  Had a stroke last week but refused to stay for further work-up during admission so daughter took him home.  Went home and has worsened.  Had a fall on Thursday, injured left hip.  Denies head injury during this incident.  Also has started complaining of chest pain today and trouble breathing when lying flat. ? ?Daughter at bedside providing majority of history. ? ?Review of Systems  ?Positive: Weakness, fall, left hip pain, chest pain ?Negative: fever ? ?Physical Exam  ?BP (!) 153/90 (BP Location: Right Arm)   Pulse 96   Temp 98.9 ?F (37.2 ?C) (Oral)   Resp 18   SpO2 95%  ? ?Gen:   Awake, no distress   ?Resp:  Normal effort  ?MSK:   Moves extremities without difficulty  ?Other:  Bruise noted to left hip, sitting in wheelchair with hip fully extended, pain elicited with flexion ? ?Medical Decision Making  ?Medically screening exam initiated at 10:44 PM.  Appropriate orders placed.  Douglas Pruitt was informed that the remainder of the evaluation will be completed by another provider, this initial triage assessment does not replace that evaluation, and the importance of remaining in the ED until their evaluation is complete. ? ?Multiple complaints after stroke last week-- chest pain, hip pain from fall, worsening weakness.  EKG, labs, CXR, Hip x-ray, repeat head CT. ?  ?Niles, Ess, PA-C ?10/29/21 2248 ? ?

## 2021-10-30 ENCOUNTER — Emergency Department (HOSPITAL_COMMUNITY): Payer: Medicare Other

## 2021-10-30 ENCOUNTER — Encounter (HOSPITAL_COMMUNITY): Payer: Self-pay | Admitting: Internal Medicine

## 2021-10-30 DIAGNOSIS — E876 Hypokalemia: Secondary | ICD-10-CM | POA: Diagnosis not present

## 2021-10-30 DIAGNOSIS — Z20822 Contact with and (suspected) exposure to covid-19: Secondary | ICD-10-CM | POA: Diagnosis not present

## 2021-10-30 DIAGNOSIS — I10 Essential (primary) hypertension: Secondary | ICD-10-CM

## 2021-10-30 DIAGNOSIS — I63511 Cerebral infarction due to unspecified occlusion or stenosis of right middle cerebral artery: Secondary | ICD-10-CM | POA: Diagnosis not present

## 2021-10-30 DIAGNOSIS — I639 Cerebral infarction, unspecified: Secondary | ICD-10-CM | POA: Diagnosis present

## 2021-10-30 DIAGNOSIS — R079 Chest pain, unspecified: Secondary | ICD-10-CM | POA: Diagnosis present

## 2021-10-30 DIAGNOSIS — I6602 Occlusion and stenosis of left middle cerebral artery: Secondary | ICD-10-CM | POA: Diagnosis not present

## 2021-10-30 DIAGNOSIS — I63231 Cerebral infarction due to unspecified occlusion or stenosis of right carotid arteries: Secondary | ICD-10-CM | POA: Diagnosis present

## 2021-10-30 DIAGNOSIS — Z7902 Long term (current) use of antithrombotics/antiplatelets: Secondary | ICD-10-CM | POA: Diagnosis not present

## 2021-10-30 DIAGNOSIS — E782 Mixed hyperlipidemia: Secondary | ICD-10-CM | POA: Diagnosis present

## 2021-10-30 DIAGNOSIS — I69354 Hemiplegia and hemiparesis following cerebral infarction affecting left non-dominant side: Secondary | ICD-10-CM | POA: Diagnosis not present

## 2021-10-30 DIAGNOSIS — E785 Hyperlipidemia, unspecified: Secondary | ICD-10-CM | POA: Diagnosis not present

## 2021-10-30 DIAGNOSIS — I63033 Cerebral infarction due to thrombosis of bilateral carotid arteries: Secondary | ICD-10-CM | POA: Diagnosis not present

## 2021-10-30 DIAGNOSIS — E669 Obesity, unspecified: Secondary | ICD-10-CM | POA: Diagnosis not present

## 2021-10-30 DIAGNOSIS — Z7982 Long term (current) use of aspirin: Secondary | ICD-10-CM | POA: Diagnosis not present

## 2021-10-30 DIAGNOSIS — Z66 Do not resuscitate: Secondary | ICD-10-CM | POA: Diagnosis present

## 2021-10-30 DIAGNOSIS — W19XXXA Unspecified fall, initial encounter: Secondary | ICD-10-CM | POA: Diagnosis present

## 2021-10-30 DIAGNOSIS — Z6827 Body mass index (BMI) 27.0-27.9, adult: Secondary | ICD-10-CM | POA: Diagnosis not present

## 2021-10-30 DIAGNOSIS — E871 Hypo-osmolality and hyponatremia: Secondary | ICD-10-CM | POA: Diagnosis present

## 2021-10-30 DIAGNOSIS — I69392 Facial weakness following cerebral infarction: Secondary | ICD-10-CM | POA: Diagnosis not present

## 2021-10-30 DIAGNOSIS — I6623 Occlusion and stenosis of bilateral posterior cerebral arteries: Secondary | ICD-10-CM | POA: Diagnosis not present

## 2021-10-30 DIAGNOSIS — M25552 Pain in left hip: Secondary | ICD-10-CM | POA: Diagnosis present

## 2021-10-30 DIAGNOSIS — E663 Overweight: Secondary | ICD-10-CM | POA: Diagnosis present

## 2021-10-30 DIAGNOSIS — Z79899 Other long term (current) drug therapy: Secondary | ICD-10-CM | POA: Diagnosis not present

## 2021-10-30 DIAGNOSIS — D72829 Elevated white blood cell count, unspecified: Secondary | ICD-10-CM | POA: Diagnosis not present

## 2021-10-30 DIAGNOSIS — R531 Weakness: Secondary | ICD-10-CM | POA: Diagnosis present

## 2021-10-30 DIAGNOSIS — H5462 Unqualified visual loss, left eye, normal vision right eye: Secondary | ICD-10-CM | POA: Diagnosis present

## 2021-10-30 DIAGNOSIS — I6521 Occlusion and stenosis of right carotid artery: Secondary | ICD-10-CM

## 2021-10-30 DIAGNOSIS — R2990 Unspecified symptoms and signs involving the nervous system: Secondary | ICD-10-CM | POA: Insufficient documentation

## 2021-10-30 DIAGNOSIS — R471 Dysarthria and anarthria: Secondary | ICD-10-CM | POA: Diagnosis present

## 2021-10-30 DIAGNOSIS — R17 Unspecified jaundice: Secondary | ICD-10-CM | POA: Diagnosis present

## 2021-10-30 DIAGNOSIS — S7002XA Contusion of left hip, initial encounter: Secondary | ICD-10-CM | POA: Diagnosis not present

## 2021-10-30 HISTORY — DX: Cerebral infarction, unspecified: I63.9

## 2021-10-30 LAB — URINALYSIS, ROUTINE W REFLEX MICROSCOPIC
Bacteria, UA: NONE SEEN
Bilirubin Urine: NEGATIVE
Glucose, UA: NEGATIVE mg/dL
Hgb urine dipstick: NEGATIVE
Ketones, ur: 20 mg/dL — AB
Leukocytes,Ua: NEGATIVE
Nitrite: NEGATIVE
Protein, ur: 30 mg/dL — AB
Specific Gravity, Urine: 1.028 (ref 1.005–1.030)
pH: 5 (ref 5.0–8.0)

## 2021-10-30 LAB — BRAIN NATRIURETIC PEPTIDE: B Natriuretic Peptide: 64.8 pg/mL (ref 0.0–100.0)

## 2021-10-30 LAB — TROPONIN I (HIGH SENSITIVITY): Troponin I (High Sensitivity): 7 ng/L (ref <18)

## 2021-10-30 IMAGING — CT CT HEAD W/O CM
4 series · 16 of 47 positions shown, 18 images · non-contrast
Comparison: [DATE]

CLINICAL DATA: Neuro deficit, acute, stroke suspected recent stroke
last week, worsening symptoms



[Series 3: head without · axial · non-contrast · 0.41mm/px · z∈[-118,+2]mm · 7 of 32 slices shown, 9 images]
[im 4/32  brain]
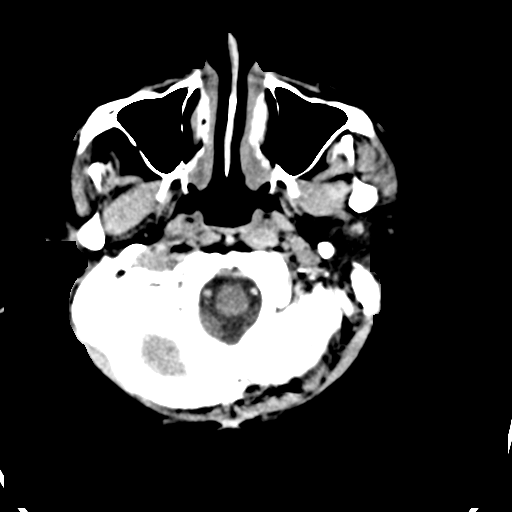
[im 4/32  bone]
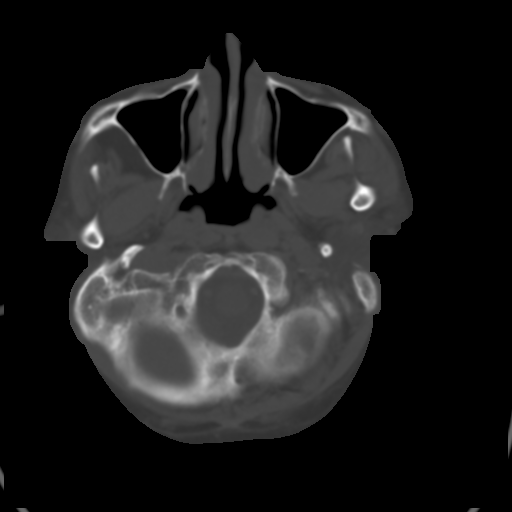
[im 8/32  brain]
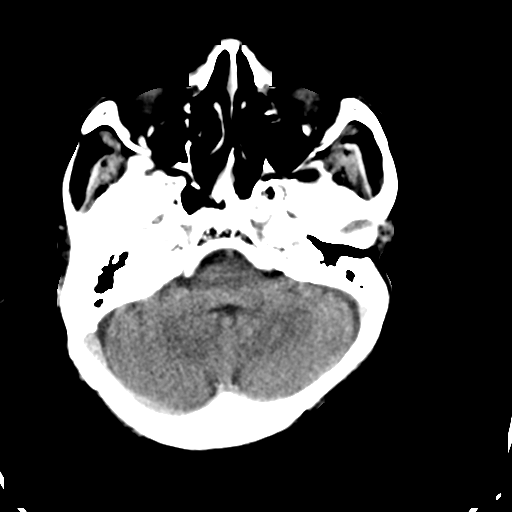
[im 12/32  brain]
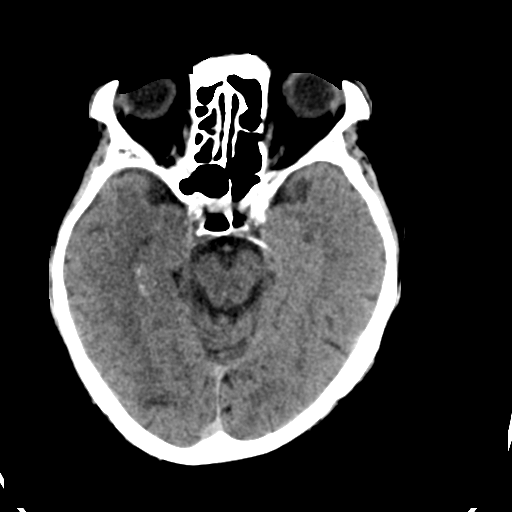
[im 16/32  brain]
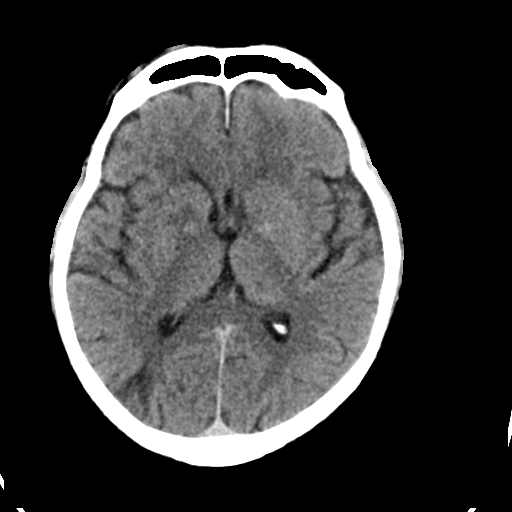
[im 20/32  brain]
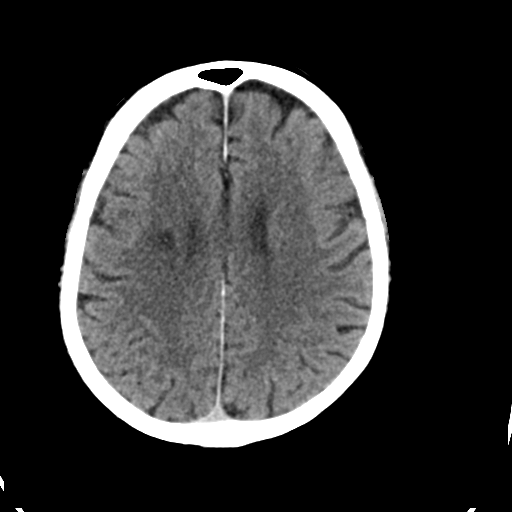
[im 20/32  bone]
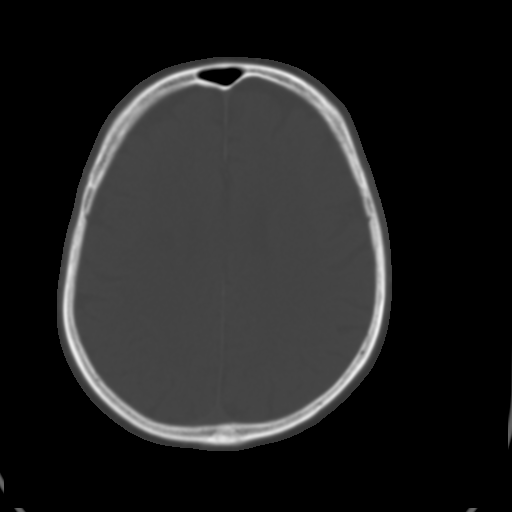
[im 24/32  brain]
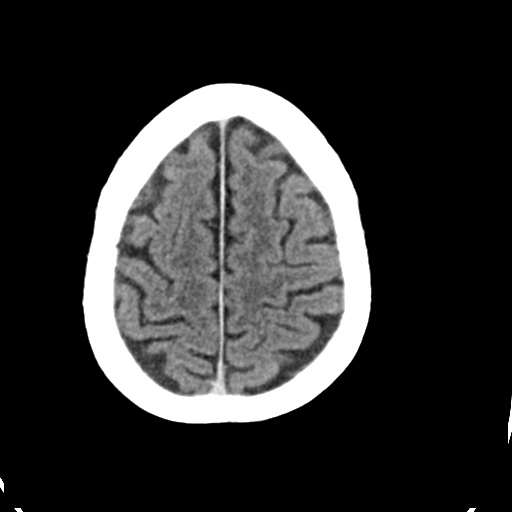
[im 28/32  brain]
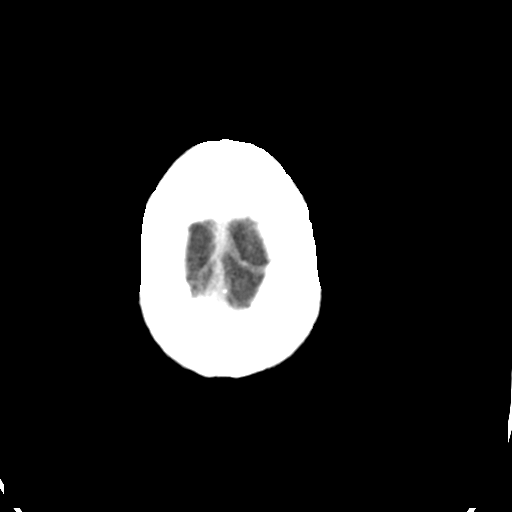

[Series 4: head bone · axial · 0.41mm/px · z∈[-119,-87]mm · 3 of 79 slices shown]
[im 8/79  bone]
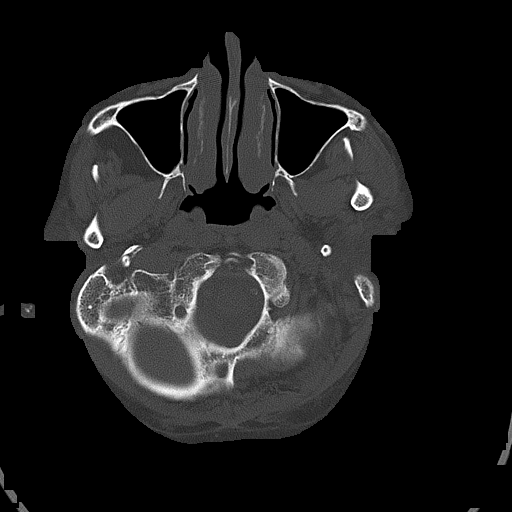
[im 16/79  bone]
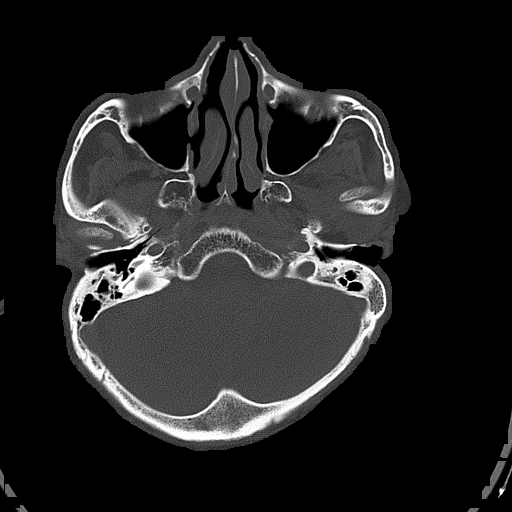
[im 24/79  bone]
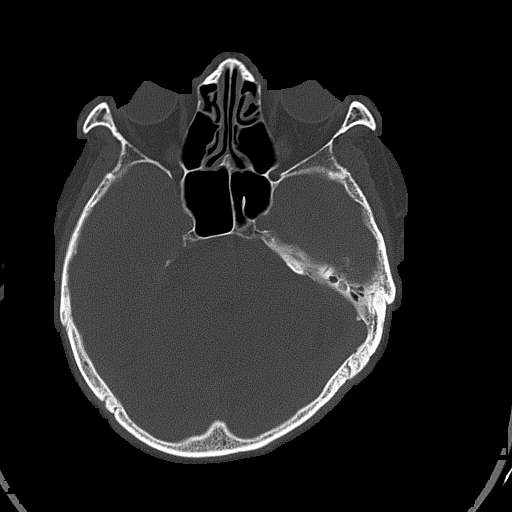

[Series 5: head without cor · coronal · non-contrast · 0.32mm/px · 3 of 67 slices shown]
[im 23/67  brain]
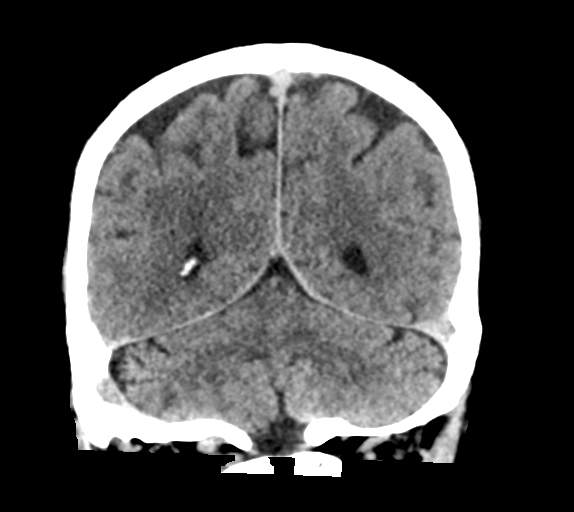
[im 30/67  brain]
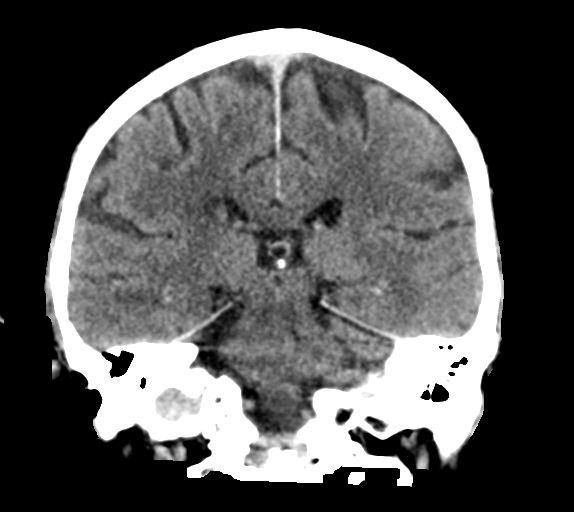
[im 37/67  brain]
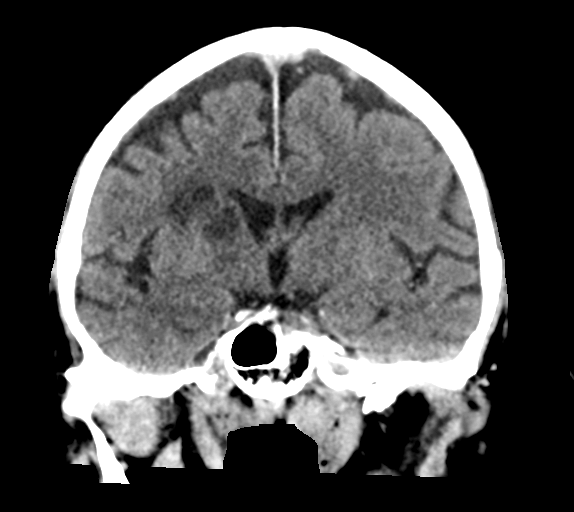

[Series 6: head without sag · sagittal · non-contrast · 0.33mm/px · 3 of 60 slices shown]
[im 20/60  brain]
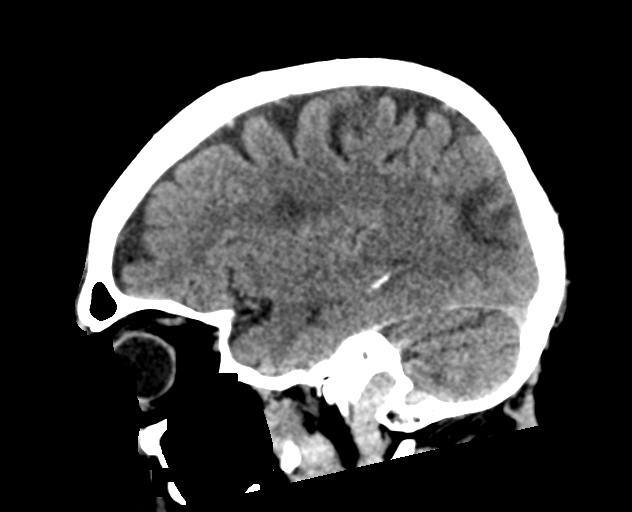
[im 30/60  brain]
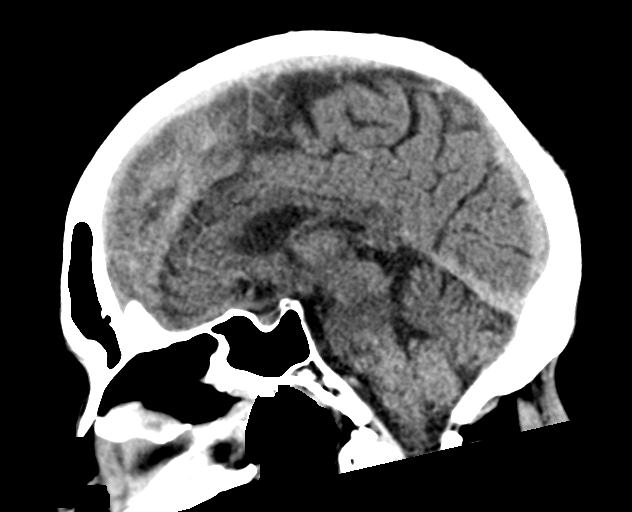
[im 40/60  brain]
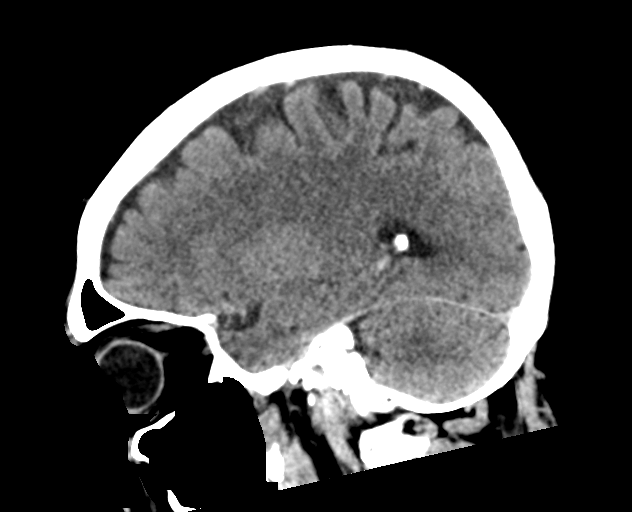

[16 of 47 positions shown; findings below may reference images not displayed]

FINDINGS: Brain: Evolutionary changes in the right parietal infarct seen on
prior study. New areas of infarction noted in the right basal
ganglia and periventricular white matter, enlarged since prior
study. No hemorrhage. No hydrocephalus.

Vascular: No hyperdense vessel or unexpected calcification.

Skull: No acute calvarial abnormality.

Sinuses/Orbits: No acute findings

Other: None
IMPRESSION: Increasing size of right basal ganglia/periventricular white matter
infarcts since prior study. Evolutionary changes in the right
parietal infarct. No hemorrhage.

## 2021-10-30 IMAGING — MR MR HEAD W/O CM
18 of 19 series · 45 of 48 positions shown · non-contrast
Comparison: [DATE]

CLINICAL DATA: Stroke last week with worsening symptoms.

EXAM:
MRI HEAD WITHOUT CONTRAST
TECHNIQUE: Multiplanar, multiecho pulse sequences of the brain and surrounding
structures were obtained without intravenous contrast.

[Series 5: DWI · axial · 3.0mm · 0.88mm/px · z∈[-81,+87]mm · 5 of 113 slices shown (1 of 4)]
[im 1/113]
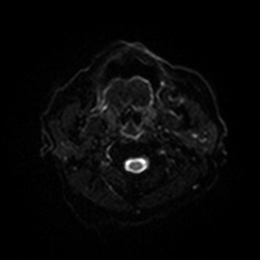
[im 29/113]
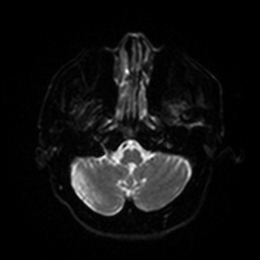
[im 57/113]
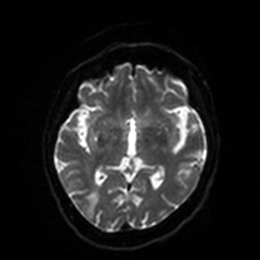
[im 85/113]
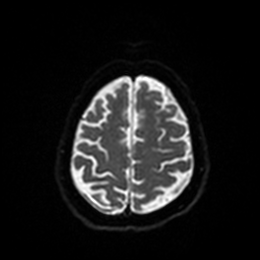
[im 113/113]
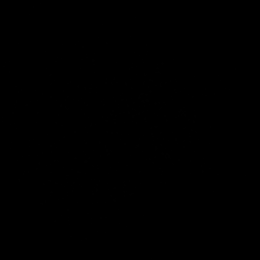

[Series 6: DWI · axial · 3.0mm · 0.88mm/px · z∈[-81,+84]mm · 2 of 54 slices shown (2 of 4)]
[im 1/54]
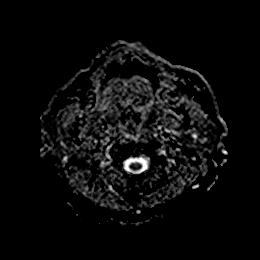
[im 54/54]
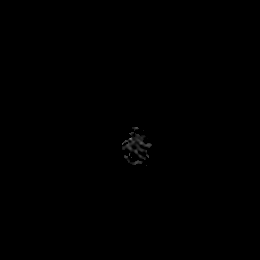

[Series 7: DWI · coronal · 4.0mm · 0.88mm/px · 3 of 78 slices shown (3 of 4)]
[im 1/78]
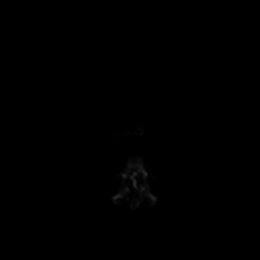
[im 39/78]
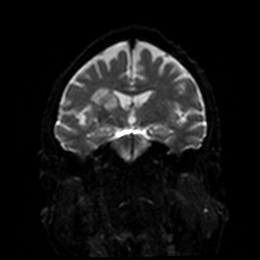
[im 78/78]
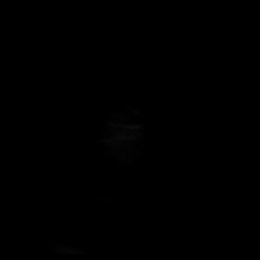

[Series 8: DWI · coronal · 4.0mm · 0.88mm/px · 2 of 39 slices shown (4 of 4)]
[im 1/39]
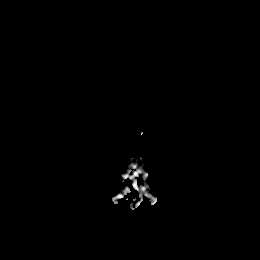
[im 39/39]
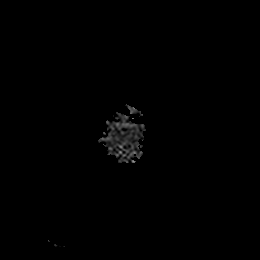

[Series 9: T1 · sagittal · 5.0mm · 0.75mm/px · 2 of 28 slices shown]
[im 1/28]
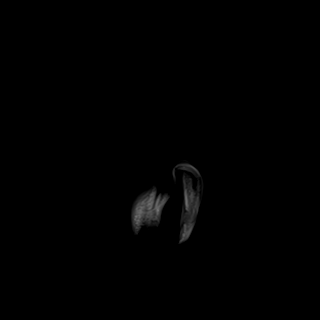
[im 28/28]
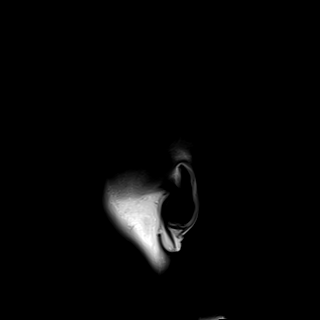

[Series 10: T2 · axial · 5.0mm · 0.72mm/px · z∈[-76,+86]mm · 2 of 28 slices shown]
[im 1/28]
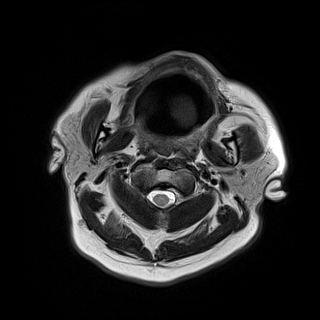
[im 28/28]
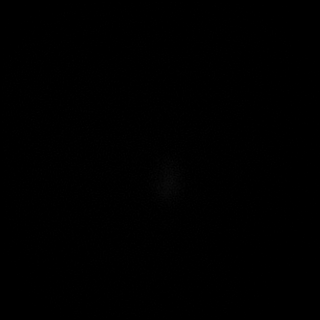

[Series 11: FLAIR · axial · 5.0mm · 0.45mm/px · 1 of 14 slices shown (1 of 3)]
[im 1/14]
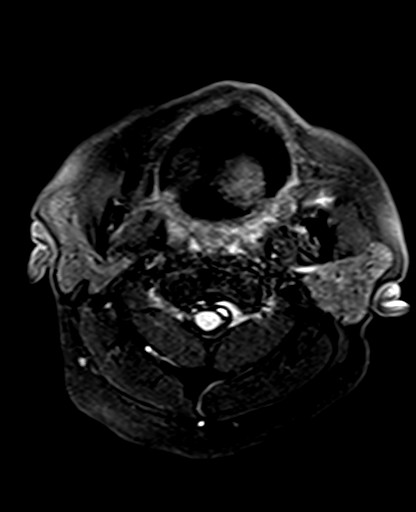

[Series 12: mag_images · axial · 3.0mm · 0.90mm/px · z∈[-79,+86]mm · 3 of 56 slices shown (1 of 2)]
[im 1/56]
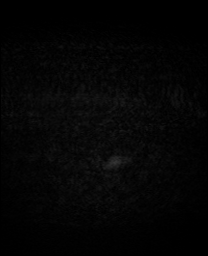
[im 28/56]
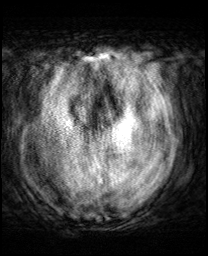
[im 56/56]
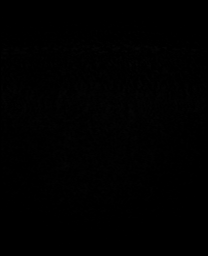

[Series 13: pha_images · axial · 3.0mm · 0.90mm/px · z∈[-46,+71]mm · 2 of 31 slices shown (1 of 2)]
[im 1/31]
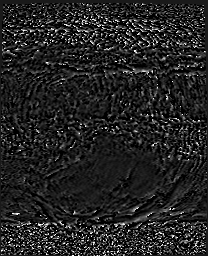
[im 31/31]
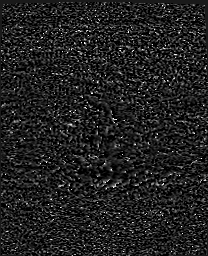

[Series 14: swi_images · axial · 3.0mm · 0.90mm/px · z∈[-79,+86]mm · 3 of 56 slices shown (1 of 2)]
[im 1/56]
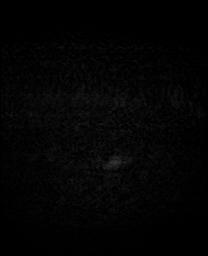
[im 28/56]
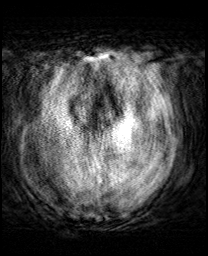
[im 56/56]
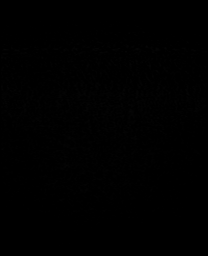

[Series 15: mip_images(sw) · axial · 24.0mm · 0.90mm/px · z∈[-69,+75]mm · 3 of 49 slices shown (1 of 2)]
[im 1/49]
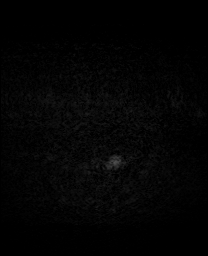
[im 25/49]
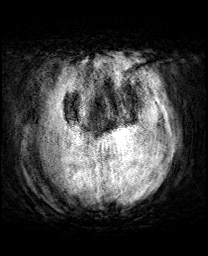
[im 49/49]
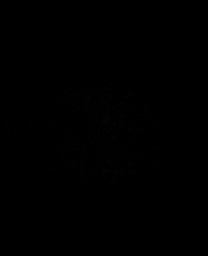

[Series 16: FLAIR · axial · 5.0mm · 0.45mm/px · z∈[-76,+86]mm · 2 of 28 slices shown (2 of 3)]
[im 1/28]
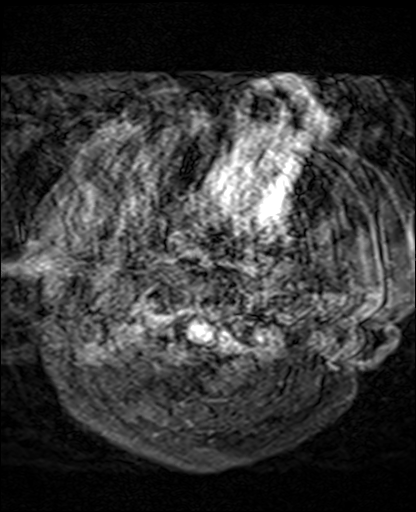
[im 28/28]
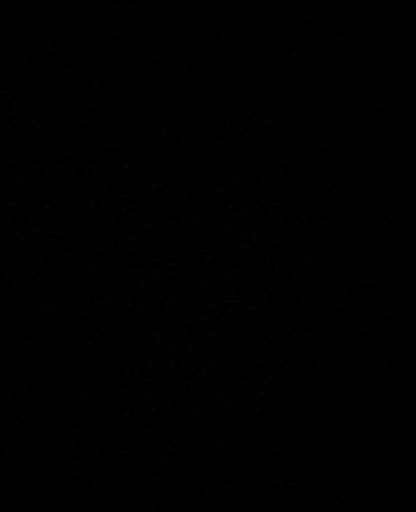

[Series 18: T2 post-contrast · coronal · 5.0mm · 0.72mm/px · 2 of 32 slices shown]
[im 1/32]
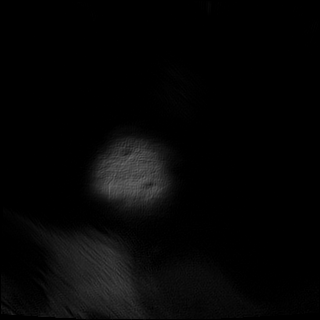
[im 32/32]
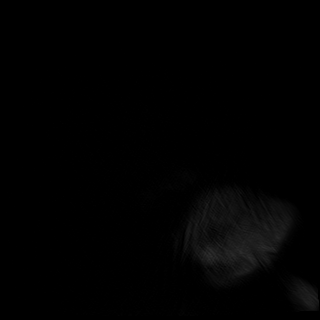

[Series 19: FLAIR · axial · 5.0mm · 0.90mm/px · z∈[-79,+89]mm · 2 of 29 slices shown (3 of 3)]
[im 1/29]
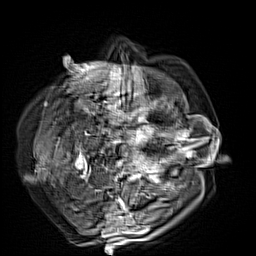
[im 29/29]
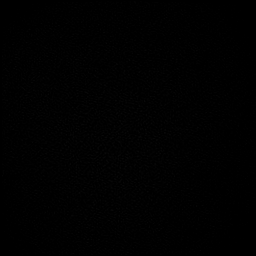

[Series 20: mag_images · axial · 3.0mm · 0.90mm/px · z∈[-91,+73]mm · 3 of 56 slices shown (2 of 2)]
[im 1/56]
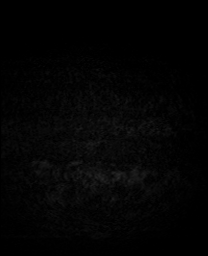
[im 28/56]
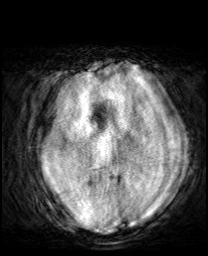
[im 56/56]
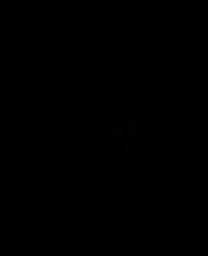

[Series 21: pha_images · axial · 3.0mm · 0.90mm/px · z∈[-79,+55]mm · 2 of 42 slices shown (2 of 2)]
[im 1/42]
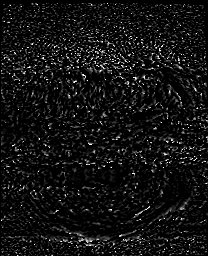
[im 42/42]
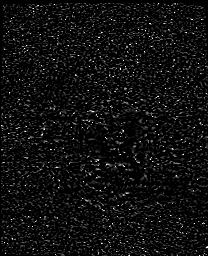

[Series 22: swi_images · axial · 3.0mm · 0.90mm/px · z∈[-91,+73]mm · 3 of 56 slices shown (2 of 2)]
[im 1/56]
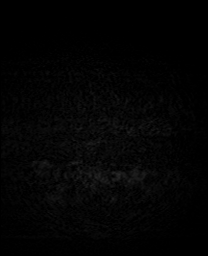
[im 28/56]
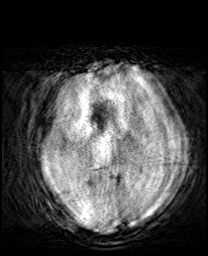
[im 56/56]
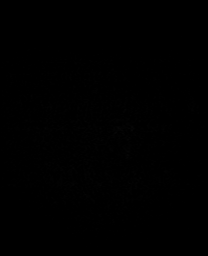

[Series 23: mip_images(sw) · axial · 24.0mm · 0.90mm/px · z∈[-80,+63]mm · 3 of 49 slices shown (2 of 2)]
[im 1/49]
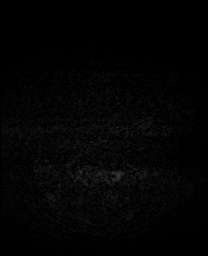
[im 25/49]
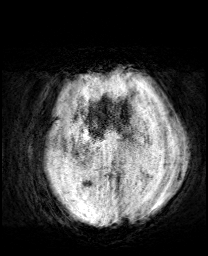
[im 49/49]
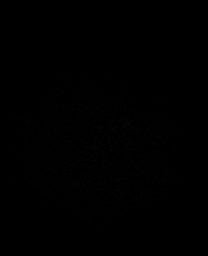

[45 of 48 positions shown; findings below may reference images not displayed]

FINDINGS: Brain: Restricted diffusion in the right basal, right corona
radiata, and right parietal occipital cortex. Restricted diffusion
has become more confluent at the right basal ganglia. Few punctate
acute infarcts newly seen in the right frontal and parietal cortex.
No hematoma, hydrocephalus, or collection.

Vascular: Known right ICA occlusion in the neck.

Skull and upper cervical spine: No focal marrow lesion.

Sinuses/Orbits: Left cataract resection.

Other: Progressively motion degraded study with multiple
nondiagnostic sequences
IMPRESSION: 1. Right basal ganglia and cerebral infarcts with mild progression
since [DATE].
[DATE]. Known right ICA occlusion in the neck.
3. Motion degraded study.

## 2021-10-30 IMAGING — CT CT ANGIO HEAD-NECK (W OR W/O PERF)
2 of 7 series · 8 of 33 positions shown · non-contrast
Comparison: Head CT from earlier today. CTA of the head neck
[DATE]

CLINICAL DATA: Stroke follow-up

EXAM:
CT ANGIOGRAPHY HEAD AND NECK
TECHNIQUE: Multidetector CT imaging of the head and neck was performed using
the standard protocol during bolus administration of intravenous
contrast. Multiplanar CT image reconstructions and MIPs were
obtained to evaluate the vascular anatomy. Carotid stenosis
measurements (when applicable) are obtained utilizing NASCET
criteria, using the distal internal carotid diameter as the
denominator.

[Series 5: cta neck/head · axial · 0.64mm/px · z∈[-171,-61]mm · 2 of 167 slices shown]
[im 56/167  soft-tissue]
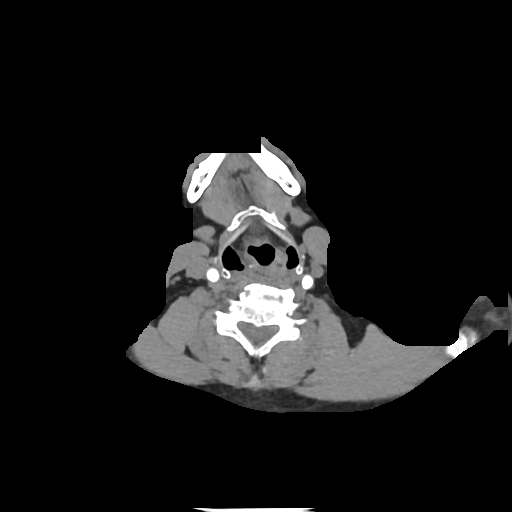
[im 111/167  soft-tissue]
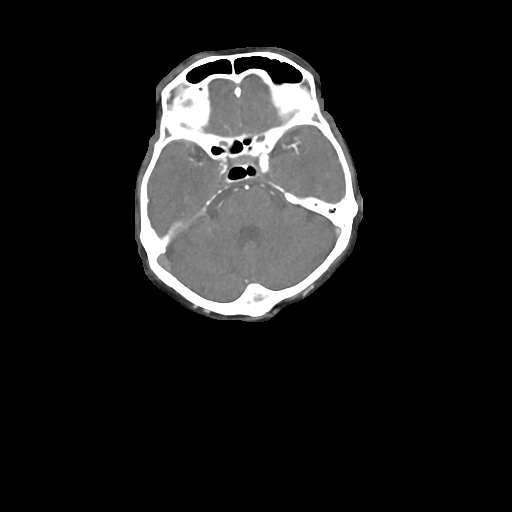

[Series 7: ax thins · axial · 0.36mm/px · z∈[-272,-50]mm · 6 of 336 slices shown]
[im 48/336  soft-tissue]
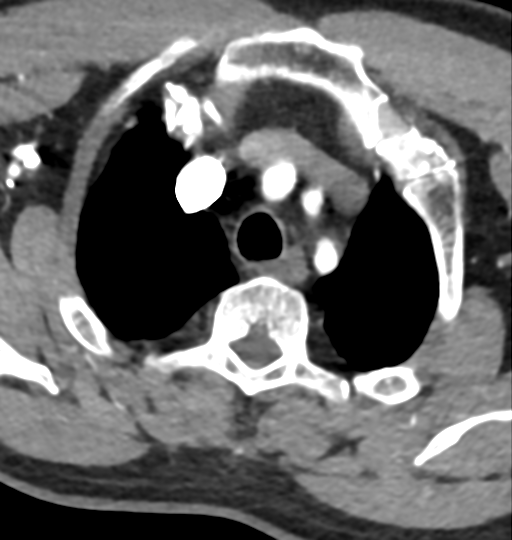
[im 96/336  bone]
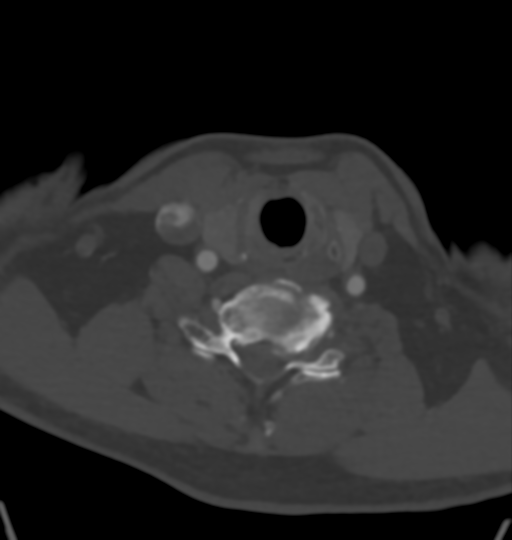
[im 144/336  soft-tissue]
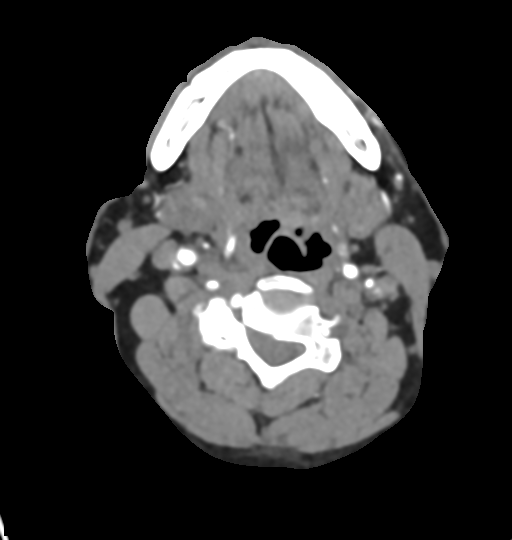
[im 192/336  bone]
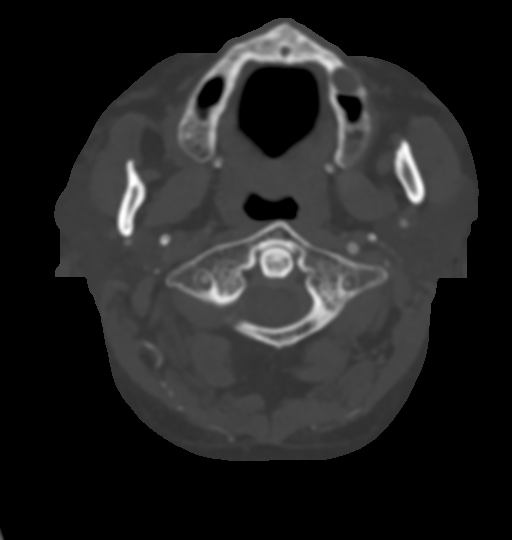
[im 240/336  soft-tissue]
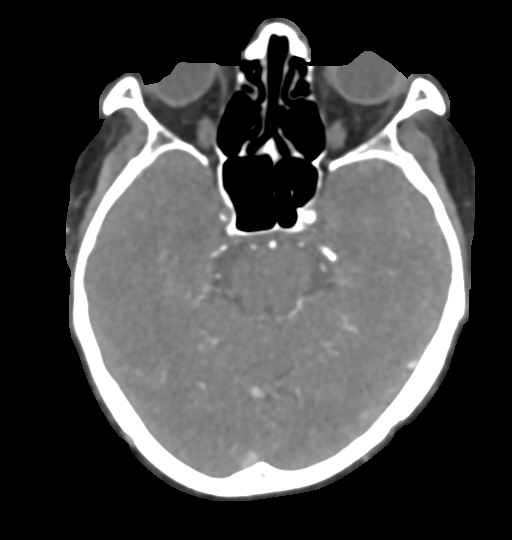
[im 288/336  bone]
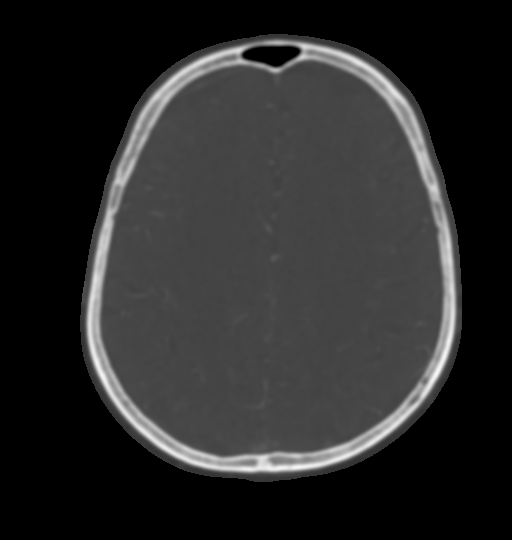

[8 of 33 positions shown; findings below may reference images not displayed]

RADIATION DOSE REDUCTION: This exam was performed according to the
departmental dose-optimization program which includes automated
exposure control, adjustment of the mA and/or kV according to
patient size and/or use of iterative reconstruction technique.

CONTRAST:  75mL OMNIPAQUE IOHEXOL 350 MG/ML SOLN
FINDINGS: CTA NECK FINDINGS

Aortic arch: No acute finding.  Three vessel branching

Right carotid system: Atheromatous plaque primarily at the
bifurcation with ICA occlusion that is known.

Left carotid system: Atheromatous plaque which is mixed density and
primarily at the bifurcation and bulb. Proximal left ICA stenosis
measures 60% based on reformats. No ulceration or dissection.

Vertebral arteries: Proximal subclavian atherosclerosis on both
sides. Low-density plaque causes advanced left vertebral origin
stenosis, patent lumen not measurable due to small vessel size and
degree of stenosis.

Skeleton: Generalized cervical spine degeneration.

Other neck: No acute finding

Upper chest: No acute finding

Review of the MIP images confirms the above findings

CTA HEAD FINDINGS

Anterior circulation: Atheromatous calcification of the carotid
siphons. Reconstituted right ICA at the paraclinoid segment. Aside
from a diminutive right P1 segment, the right MCA and PCA appear
largely isolated. High-grade narrowing at the left M1 segment.
Scattered atheromatous irregularity of medium size vessels with
notable left A2 segment stenosis.

Posterior circulation: Dominant right vertebral artery. Patent right
vertebral and basilar arteries which are small in the setting of
fetal type right PCA flow. High-grade left P2 segment stenosis. Less
intense enhancement of the right PCA due to right ICA occlusion in
the neck.

Venous sinuses: Unremarkable for arterial study

Anatomic variants: As above

Review of the MIP images confirms the above findings
IMPRESSION: 1. No acute finding when compared to [DATE].
2. Right ICA occlusion in the neck with paraclinoid reconstitution.
Nearly isolated right MCA and fetal type PCA with underfilling and
known subacute infarcts.
3. High-grade left P2 and M1 segment stenoses.
4. 60% stenosis of the left proximal ICA.
5. High-grade left vertebral origin stenosis.

## 2021-10-30 MED ORDER — ENOXAPARIN SODIUM 40 MG/0.4ML IJ SOSY
40.0000 mg | PREFILLED_SYRINGE | INTRAMUSCULAR | Status: DC
  Administered 2021-10-30 – 2021-11-01 (×3): 40 mg via SUBCUTANEOUS
  Filled 2021-10-30 (×3): qty 0.4

## 2021-10-30 MED ORDER — STROKE: EARLY STAGES OF RECOVERY BOOK
Freq: Once | Status: AC
  Filled 2021-10-30: qty 1

## 2021-10-30 MED ORDER — ACETAMINOPHEN 160 MG/5ML PO SOLN: 650.0000 mg | ORAL | Status: DC | PRN

## 2021-10-30 MED ORDER — ACETAMINOPHEN 650 MG RE SUPP: 650.0000 mg | RECTAL | Status: DC | PRN

## 2021-10-30 MED ORDER — NYSTATIN 100000 UNIT/ML MT SUSP
5.0000 mL | Freq: Four times a day (QID) | OROMUCOSAL | Status: DC
  Administered 2021-10-30 – 2021-11-01 (×7): 500000 [IU] via ORAL
  Filled 2021-10-30 (×11): qty 5

## 2021-10-30 MED ORDER — ATORVASTATIN CALCIUM 40 MG PO TABS
40.0000 mg | ORAL_TABLET | Freq: Every day | ORAL | Status: DC
  Administered 2021-10-30 – 2021-10-31 (×2): 40 mg via ORAL
  Filled 2021-10-30 (×2): qty 1

## 2021-10-30 MED ORDER — SODIUM CHLORIDE 0.9 % IV SOLN: INTRAVENOUS | Status: DC

## 2021-10-30 MED ORDER — LISINOPRIL 2.5 MG PO TABS
5.0000 mg | ORAL_TABLET | Freq: Every day | ORAL | Status: DC
  Administered 2021-10-31 – 2021-11-01 (×2): 5 mg via ORAL
  Filled 2021-10-30 (×2): qty 2

## 2021-10-30 MED ORDER — SENNOSIDES-DOCUSATE SODIUM 8.6-50 MG PO TABS: 1.0000 | ORAL_TABLET | Freq: Every evening | ORAL | Status: DC | PRN

## 2021-10-30 MED ORDER — ASPIRIN EC 81 MG PO TBEC
81.0000 mg | DELAYED_RELEASE_TABLET | Freq: Every day | ORAL | Status: DC
  Administered 2021-10-30 – 2021-11-01 (×3): 81 mg via ORAL
  Filled 2021-10-30 (×3): qty 1

## 2021-10-30 MED ORDER — ACETAMINOPHEN 325 MG PO TABS: 650.0000 mg | ORAL_TABLET | ORAL | Status: DC | PRN

## 2021-10-30 MED ORDER — CLOPIDOGREL BISULFATE 75 MG PO TABS
75.0000 mg | ORAL_TABLET | Freq: Every day | ORAL | Status: DC
  Administered 2021-10-30 – 2021-11-01 (×3): 75 mg via ORAL
  Filled 2021-10-30 (×3): qty 1

## 2021-10-30 MED ORDER — IOHEXOL 350 MG/ML SOLN
75.0000 mL | Freq: Once | INTRAVENOUS | Status: AC | PRN
  Administered 2021-10-30: 75 mL via INTRAVENOUS

## 2021-10-30 MED ORDER — AMLODIPINE BESYLATE 5 MG PO TABS
5.0000 mg | ORAL_TABLET | Freq: Every day | ORAL | Status: DC
  Administered 2021-10-31 – 2021-11-01 (×2): 5 mg via ORAL
  Filled 2021-10-30 (×2): qty 1

## 2021-10-30 NOTE — Assessment & Plan Note (Addendum)
-  R carotid occlusion ?-Previously declined angiogram ?-Has had progressive symptoms and appears to need reconsideration of angiogram ?-Dr. Lorin Mercy had spoken with neurointerventional radiology and they will see and discuss with patient/family ?-PCCM has also been consulted, as he will need to go to the ICU post-procedure, if performed ?-After further discussion, he is unlikely to significantly benefit from this procedure per Neurology so far out and it is canceled at this time ?
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
## 2021-10-30 NOTE — Progress Notes (Signed)
On Call Note ? ?Called by Dr. Wilkie Aye for patient's worsening left sided weakness. Recent stroke due to carotid dissection. Started on DAPT for 3 months. Recommended he undergo catheter angiogram at last admission, but family refused indicating that would be patient's wish. For now, clincally worse - CTH although read as worse from CT of 3/27, compared to MRI shows expected evolution of the right hemispheric strokes that happened in late March. He is on DAPT already and there is no further acute intervention that I can offer at this time. I would recommend CTA head and neck as well as MRI - if either shows significant worsening, please re-engage [place] for further input if desired by patient or family. With stroke w/u completed a week ago, his risk factors have been optimized and he needs ongoing therapy and outpatient follow up. ? ?D/W Dr. Wilkie Aye in the ER. ? ?-- ?Douglas Dikes, MD ?Neurologist ?[facility] ?Pager: [phone] ? ?

## 2021-10-30 NOTE — Assessment & Plan Note (Addendum)
-  Dr. Ophelia Charter had discussed code status with the patient's daughter and the patient would not desire resuscitation and would prefer to die a natural death should that situation arise. ?-He will need a gold out of facility DNR form at the time of discharge ?

## 2021-10-30 NOTE — Assessment & Plan Note (Addendum)
-  Patient with admission last week for acute CVA ?-He had carotid occlusion and was recommended for angiogram but declined ?-PT and OT evaluated him and did not recommend therapy at that time but He has been increasingly weak at home and appears to have a significant L hemiplegia at this time ?-Imaging appears to show progression of CVA ?-Was admitted and restart CVA evaluation ?-Resumed ASA/Plavix  ?-Repeat Head CTA and MRI Done  ?-Will admit for CVA evaluation and as above ?-Telemetry monitoring ?-Neurology consulted and appreciate further evaluation ?-PT/OT/ST/Nutrition Consults ?-CIR consult for placement ?

## 2021-10-30 NOTE — Consult Note (Signed)
? ?NAME:  Douglas Pruitt, MRN:  [id number], DOB:  [birth year], LOS: 1 ?ADMISSION DATE:  10/29/2021, CONSULTATION DATE: 10/28/2021 ?REFERRING MD: Interventional radiology, CHIEF COMPLAINT: Left-sided weakness ? ?History of Present Illness:  ?71 year old male who presented 10/22/2021 with left sided weakness right facial droop right carotid artery occlusion and stroke.  He has family declined interventions at that time.  He returns for 09/2021 with worsening left weakness and is now amenable to invasive procedures.  He is being prepped via femoral approach for cerebral angiography pulmonary critical care is asked to be available in case he lands in intensive care unit. ? ?Pertinent  Medical History  ? ?Past Medical History:  ?Diagnosis Date  ? CVA (cerebral vascular accident) (HCC) 10/22/2021  ? Hyperlipidemia   ? Hypertension   ? ? ? ?Significant Hospital Events: ?Including procedures, antibiotic start and stop dates in addition to other pertinent events   ? ? ?Interim History / Subjective:  ?71 year old male recently presented March 27 stroke family declined interventions returned 10/29/2021 he is being prepped for cerebral angiography. ? ?Objective   ?Blood pressure (!) 145/56, pulse 61, temperature 98.9 ?F (37.2 ?C), temperature source Oral, resp. rate 18, height 5\' 4"  (1.626 m), weight 71.6 kg, SpO2 100 %. ?   ?   ?No intake or output data in the 24 hours ending 10/30/21 1022 ?Filed Weights  ? 10/30/21 0449  ?Weight: 71.6 kg  ? ? ?Examination: ?General: Elderly male non-English-speaking left-sided weakness with right facial droop noted but able to follow commands ?HENT: Left side of face is enlarged ?Lungs: Diminished breath sounds throughout ?Cardiovascular: Heart sounds are regular ?Abdomen: Nontender positive bowel sounds ?Extremities: Without edema ?Neuro: Left-sided weakness right-sided facial droop left side of face distorted from old injury with blindness in the left eye ?GU: Amber urine ? ?Resolved Hospital Problem list    ? ? ?Assessment & Plan:  ?Status post stroke with left-sided weakness right facial with right carotid stenosis.  Initially seen on October 22, 2021 and family declined further interventions at that time.  Returned 10/29/2021 is now amenable to interventions.  Pulmonary critical care asked to evaluate in case they need ICU admission postprocedure. ?Continue to follow ?Assume care if he goes to the intensive care unit ? ?Hypertension ?Continue antihypertensive ? ? ?Best Practice (right click and "Reselect all SmartList Selections" daily)  ? ?Diet/type: NPO ?DVT prophylaxis: not indicated ?GI prophylaxis: PPI ?Lines: N/A ?Foley:  N/A ?Code Status:  DNR ?Last date of multidisciplinary goals of care discussion [tbd] ?10/30/2021 daughter acted as Equities trader. ?Labs   ?CBC: ?Recent Labs  ?Lab 10/29/21 ?2258  ?WBC 15.4*  ?NEUTROABS 10.3*  ?HGB 15.9  ?HCT 45.4  ?MCV 85.2  ?PLT 202  ? ? ?Basic Metabolic Panel: ?Recent Labs  ?Lab 10/29/21 ?2258  ?NA 130*  ?K 3.3*  ?CL 95*  ?CO2 22  ?GLUCOSE 124*  ?BUN 18  ?CREATININE 0.79  ?CALCIUM 8.8*  ? ?GFR: ?Estimated Creatinine Clearance: 75.8 mL/min (by C-G formula based on SCr of 0.79 mg/dL). ?Recent Labs  ?Lab 10/29/21 ?2258  ?WBC 15.4*  ? ? ?Liver Function Tests: ?Recent Labs  ?Lab 10/29/21 ?2258  ?AST 44*  ?ALT 33  ?ALKPHOS 63  ?BILITOT 1.4*  ?PROT 8.1  ?ALBUMIN 3.8  ? ?No results for input(s): LIPASE, AMYLASE in the last 168 hours. ?No results for input(s): AMMONIA in the last 168 hours. ? ?ABG ?   ?Component Value Date/Time  ? TCO2 26 10/22/2021 1129  ?  ? ?Coagulation Profile: ?No results  for input(s): INR, PROTIME in the last 168 hours. ? ?Cardiac Enzymes: ?No results for input(s): CKTOTAL, CKMB, CKMBINDEX, TROPONINI in the last 168 hours. ? ?HbA1C: ?Hgb A1c MFr Bld  ?Date/Time Value Ref Range Status  ?10/23/2021 04:09 AM 5.6 4.8 - 5.6 % Final  ?  Comment:  ?  (NOTE) ?Pre diabetes:          5.7%-6.4% ? ?Diabetes:              >6.4% ? ?Glycemic control for   <7.0% ?adults with  diabetes ?  ?03/06/2020 04:31 PM 5.6 4.8 - 5.6 % Final  ?  Comment:  ?           Prediabetes: 5.7 - 6.4 ?         Diabetes: >6.4 ?         Glycemic control for adults with diabetes: <7.0 ?  ? ? ?CBG: ?No results for input(s): GLUCAP in the last 168 hours. ? ?Review of Systems:   ?10 point review of system taken, please see HPI for positives and negatives. ?Positive for chest pain  ?Positive for increasing left-sided ? ?Past Medical History:  ?He,  has a past medical history of CVA (cerebral vascular accident) (HCC) (10/22/2021), Hyperlipidemia, and Hypertension.  ? ?Surgical History:  ? ?Past Surgical History:  ?Procedure Laterality Date  ? EYE SURGERY    ? EYE SURGERY    ? work injury, left eye, blind in left eye  ?  ? ?Social History:  ? reports that he has never smoked. He has never used smokeless tobacco. He reports that he does not drink alcohol and does not use drugs.  ? ?Family History:  ?His family history is not on file.  ? ?Allergies ?No Known Allergies  ? ?Home Medications  ?Prior to Admission medications   ?Medication Sig Start Date End Date Taking? Authorizing Provider  ?acetaminophen (TYLENOL) 325 MG tablet Take 650 mg by mouth every 6 (six) hours as needed for moderate pain or headache.   Yes [provider]  ?amLODipine (NORVASC) 5 MG tablet Take 1 tablet (5 mg total) by mouth daily. 10/23/21 11/22/21 Yes Zigmund Daniel., MD  ?aspirin EC 81 MG EC tablet Take 1 tablet (81 mg total) by mouth daily. Swallow whole. 10/24/21 01/22/22 Yes Zigmund Daniel., MD  ?atorvastatin (LIPITOR) 40 MG tablet Take 1 tablet (40 mg total) by mouth at bedtime. 10/23/21 11/22/21 Yes Zigmund Daniel., MD  ?clopidogrel (PLAVIX) 75 MG tablet Take 1 tablet (75 mg total) by mouth daily. Take with aspirin for 90 days, then take aspirin alone. 10/24/21 01/22/22 Yes Zigmund Daniel., MD  ?diclofenac Sodium (VOLTAREN) 1 % GEL Apply 2 g topically 4 (four) times daily. ?Patient taking differently: Apply 2 g  topically 2 (two) times daily as needed (knees). 10/23/21  Yes Zigmund Daniel., MD  ?Lidocaine [facility] PAIN RELIEVING EX) Apply 2 patches topically daily as needed (left hip pain).   Yes [provider]  ?lisinopril (ZESTRIL) 5 MG tablet Take 1 tablet (5 mg total) by mouth daily. 10/23/21 11/22/21 Yes Zigmund Daniel., MD  ?  ? ?Critical care time:  ?  ? ?Brett Canales Bekim Werntz ACNP ?Acute Care Nurse Practitioner ?[facility] ?Please consult Amion ?10/30/2021, 10:23 AM ? ? ? ? ?

## 2021-10-30 NOTE — ED Provider Notes (Signed)
?[facility] EMERGENCY DEPARTMENT ?Provider Note ? ? ?CSN: [id number] ?Arrival date & time: 10/29/21  2140 ? ?  ? ?History ? ?Chief Complaint  ?Patient presents with  ? Weakness  ? Chest Pain  ? Hip Pain  ? ? ?Douglas Pruitt is a 71 y.o. male. ? ?HPI ? ?  ? ?This is a 71 year old male with recent history of CVA and right carotid stenosis/occlusion who presents with worsening weakness.  Patient's daughter provides the history.  Reports that he "early" after being admitted for CVA.  At discharge she had some left facial droop but was independent.  He did not wish to proceed with angiography of his carotid arteries.  He was placed on Plavix and aspirin.  She reports he has been compliant.  She states for the first 2 days he was at home he was "doing fine."  However she began to see decline Thursday and into Friday.  He became nonambulatory.  He was more weak on the left side.  He was unable to get to the bathroom on his own.  She states intermittently he has complained of some chest pain and shortness of breath.  Decreased appetite. ? ?Chart reviewed.  Patient declined further intervention while in the hospital.  Per Dr. Marlis Edelson final neurology note, patient had residual right-sided facial droop but otherwise exam was baseline.  Per physical therapy notes, patient was independent with minimal assist. ? ?Home Medications ?Prior to Admission medications   ?Medication Sig Start Date End Date Taking? Authorizing Provider  ?acetaminophen (TYLENOL) 325 MG tablet Take 650 mg by mouth every 6 (six) hours as needed for moderate pain or headache.   Yes [provider]  ?amLODipine (NORVASC) 5 MG tablet Take 1 tablet (5 mg total) by mouth daily. 10/23/21 11/22/21 Yes Zigmund Daniel., MD  ?aspirin EC 81 MG EC tablet Take 1 tablet (81 mg total) by mouth daily. Swallow whole. 10/24/21 01/22/22 Yes Zigmund Daniel., MD  ?atorvastatin (LIPITOR) 40 MG tablet Take 1 tablet (40 mg total) by mouth at bedtime.  10/23/21 11/22/21 Yes Zigmund Daniel., MD  ?clopidogrel (PLAVIX) 75 MG tablet Take 1 tablet (75 mg total) by mouth daily. Take with aspirin for 90 days, then take aspirin alone. 10/24/21 01/22/22 Yes Zigmund Daniel., MD  ?diclofenac Sodium (VOLTAREN) 1 % GEL Apply 2 g topically 4 (four) times daily. ?Patient taking differently: Apply 2 g topically 2 (two) times daily as needed (knees). 10/23/21  Yes Zigmund Daniel., MD  ?Lidocaine [facility] PAIN RELIEVING EX) Apply 2 patches topically daily as needed (left hip pain).   Yes [provider]  ?lisinopril (ZESTRIL) 5 MG tablet Take 1 tablet (5 mg total) by mouth daily. 10/23/21 11/22/21 Yes Zigmund Daniel., MD  ?   ? ?Allergies    ?Patient has no known allergies.   ? ?Review of Systems   ?Review of Systems  ?Respiratory:  Positive for shortness of breath.   ?Cardiovascular:  Positive for chest pain.  ?Neurological:  Positive for weakness.  ?All other systems reviewed and are negative. ? ?Physical Exam ?Updated Vital Signs ?BP 133/66   Pulse 60   Temp 98.9 ?F (37.2 ?C) (Oral)   Resp 20   Ht 1.626 m (5\' 4" )   Wt 71.6 kg   SpO2 99%   BMI 27.09 kg/m?  ?Physical Exam ?Vitals and nursing note reviewed.  ?Constitutional:   ?   Appearance: He is well-developed. He is  not ill-appearing.  ?HENT:  ?   Head: Normocephalic and atraumatic.  ?Eyes:  ?   Pupils: Pupils are equal, round, and reactive to light.  ?Cardiovascular:  ?   Rate and Rhythm: Normal rate and regular rhythm.  ?   Heart sounds: Normal heart sounds. No murmur heard. ?Pulmonary:  ?   Effort: Pulmonary effort is normal. No respiratory distress.  ?   Breath sounds: Normal breath sounds. No wheezing.  ?Abdominal:  ?   Palpations: Abdomen is soft.  ?   Tenderness: There is no abdominal tenderness. There is no rebound.  ?Musculoskeletal:  ?   Cervical back: Neck supple.  ?   Right lower leg: No edema.  ?   Left lower leg: No edema.  ?Lymphadenopathy:  ?   Cervical: No cervical  adenopathy.  ?Skin: ?   General: Skin is warm and dry.  ?Neurological:  ?   Mental Status: He is alert.  ?   Comments: Oriented, left facial droop noted, 4 out of 5 grip strength on the left, 2 out of 5 proximal strength on the left upper extremity, 2 out of 5 strength left lower extremity, right side intact  ?Psychiatric:     ?   Mood and Affect: Mood normal.  ? ? ?ED Results / Procedures / Treatments   ?Labs ?(all labs ordered are listed, but only abnormal results are displayed) ?Labs Reviewed  ?CBC WITH DIFFERENTIAL/PLATELET - Abnormal; Notable for the following components:  ?    Result Value  ? WBC 15.4 (*)   ? Neutro Abs 10.3 (*)   ? Monocytes Absolute 1.1 (*)   ? All other components within normal limits  ?COMPREHENSIVE METABOLIC PANEL - Abnormal; Notable for the following components:  ? Sodium 130 (*)   ? Potassium 3.3 (*)   ? Chloride 95 (*)   ? Glucose, Bld 124 (*)   ? Calcium 8.8 (*)   ? AST 44 (*)   ? Total Bilirubin 1.4 (*)   ? All other components within normal limits  ?URINALYSIS, ROUTINE W REFLEX MICROSCOPIC - Abnormal; Notable for the following components:  ? Color, Urine AMBER (*)   ? APPearance HAZY (*)   ? Ketones, ur 20 (*)   ? Protein, ur 30 (*)   ? All other components within normal limits  ?BRAIN NATRIURETIC PEPTIDE  ?TROPONIN I (HIGH SENSITIVITY)  ?TROPONIN I (HIGH SENSITIVITY)  ? ? ?EKG ?EKG Interpretation ? ?Date/Time:  Monday October 29 2021 22:45:31 EDT ?Ventricular Rate:  81 ?PR Interval:  152 ?QRS Duration: 86 ?QT Interval:  388 ?QTC Calculation: 450 ?R Axis:   13 ?Text Interpretation: Normal sinus rhythm Minimal voltage criteria for LVH, may be normal variant ( R in aVL ) ST & T wave abnormality, consider inferolateral ischemia Abnormal ECG When compared with ECG of 23-Oct-2021 04:59, PREVIOUS ECG IS PRESENT Confirmed by Ross MarcusHorton, Kavya Haag ([id number]) on 10/30/2021 3:21:32 AM ? ?Radiology ?CT ANGIO HEAD NECK W WO CM ? ?Result Date: 10/30/2021 ?CLINICAL DATA:  Stroke follow-up EXAM: CT ANGIOGRAPHY  HEAD AND NECK TECHNIQUE: Multidetector CT imaging of the head and neck was performed using the standard protocol during bolus administration of intravenous contrast. Multiplanar CT image reconstructions and MIPs were obtained to evaluate the vascular anatomy. Carotid stenosis measurements (when applicable) are obtained utilizing NASCET criteria, using the distal internal carotid diameter as the denominator. RADIATION DOSE REDUCTION: This exam was performed according to the departmental dose-optimization program which includes automated exposure control, adjustment of the mA  and/or kV according to patient size and/or use of iterative reconstruction technique. CONTRAST:  30mL OMNIPAQUE IOHEXOL 350 MG/ML SOLN COMPARISON:  Head CT from earlier today. CTA of the head neck 10/22/2021 FINDINGS: CTA NECK FINDINGS Aortic arch: No acute finding.  Three vessel branching Right carotid system: Atheromatous plaque primarily at the bifurcation with ICA occlusion that is known. Left carotid system: Atheromatous plaque which is mixed density and primarily at the bifurcation and bulb. Proximal left ICA stenosis measures 60% based on reformats. No ulceration or dissection. Vertebral arteries: Proximal subclavian atherosclerosis on both sides. Low-density plaque causes advanced left vertebral origin stenosis, patent lumen not measurable due to small vessel size and degree of stenosis. Skeleton: Generalized cervical spine degeneration. Other neck: No acute finding Upper chest: No acute finding Review of the MIP images confirms the above findings CTA HEAD FINDINGS Anterior circulation: Atheromatous calcification of the carotid siphons. Reconstituted right ICA at the paraclinoid segment. Aside from a diminutive right P1 segment, the right MCA and PCA appear largely isolated. High-grade narrowing at the left M1 segment. Scattered atheromatous irregularity of medium size vessels with notable left A2 segment stenosis. Posterior circulation:  Dominant right vertebral artery. Patent right vertebral and basilar arteries which are small in the setting of fetal type right PCA flow. High-grade left P2 segment stenosis. Less intense enhancement of the right PCA

## 2021-10-30 NOTE — Assessment & Plan Note (Signed)
Chronic. 

## 2021-10-30 NOTE — ED Notes (Signed)
Pt transported to MRI 

## 2021-10-30 NOTE — Progress Notes (Signed)
Inpatient Rehab Admissions Coordinator:  ? ?Consult received and chart reviewed.  Awaiting therapy evaluations.  Also noted pt is observation status at this time. Pt may not have the medical necessity to warrant [facility] inpatient rehab stay if they remain observation, regardless of therapy recommendation.  ? ?Estill Dooms, PT, DPT ?Admissions Coordinator ?[phone] ?10/30/21  ?10:32 AM ? ? ?

## 2021-10-30 NOTE — H&P (Addendum)
? ?Addendum 4.4.23 - Per Dr. Pearlean Brownie procedure to be cancelled at this time. Nothing to be done about right carotid (occluded) and the left is asymptomatic. Please contact IR should patient condition change or any additional services are needed.   ? ?Chief Complaint: ?Right carotid stenosis with occulusion. Request is for cerebral angiogram.  ? ?Referring Physician(s): ?Dr. Marthe Patch ? ?Supervising Physician: Baldemar Lenis ? ?Patient Status: [facility] - ED ? ?History of Present Illness: ?Douglas Pruitt is a 71 y.o. male inpatient (in the ED). History of HTN, HLD, left sided vision lost due to work injury. Recent CVA (October 22, 2021) with right carotid stenosis with occulusion. At that time family declined intervention. Patient presented to the ED at [facility] on 4.3.23  with worsening weakness intermittent chest pain and SHOB.  MR Brain from 4.4.23 reads Right basal ganglia and cerebral infarcts with mild progression since 10/23/2021. Known right ICA occlusion in the neck. Team is requesting cerebral angiogram. ? ?Patient seen at bedside with daughter who is acting as a Nurse, learning disability. Patient alert and laying in bed, calm. Endorses generalized weakness. Denies any fevers, headache, chest pain, SOB, cough, abdominal pain, nausea, vomiting or bleeding. Return precautions and treatment recommendations and follow-up discussed with the patient and his daughter who are agreeable with the plan. ? ?Past Medical History:  ?Diagnosis Date  ? CVA (cerebral vascular accident) (HCC) 10/22/2021  ? Hyperlipidemia   ? Hypertension   ? ? ?Past Surgical History:  ?Procedure Laterality Date  ? EYE SURGERY    ? EYE SURGERY    ? work injury, left eye, blind in left eye  ? ? ?Allergies: ?Patient has no known allergies. ? ?Medications: ?Prior to Admission medications   ?Medication Sig Start Date End Date Taking? Authorizing Provider  ?acetaminophen (TYLENOL) 325 MG tablet Take 650 mg by mouth every 6 (six) hours as needed for moderate pain or  headache.   Yes [provider]  ?amLODipine (NORVASC) 5 MG tablet Take 1 tablet (5 mg total) by mouth daily. 10/23/21 11/22/21 Yes Zigmund Daniel., MD  ?aspirin EC 81 MG EC tablet Take 1 tablet (81 mg total) by mouth daily. Swallow whole. 10/24/21 01/22/22 Yes Zigmund Daniel., MD  ?atorvastatin (LIPITOR) 40 MG tablet Take 1 tablet (40 mg total) by mouth at bedtime. 10/23/21 11/22/21 Yes Zigmund Daniel., MD  ?clopidogrel (PLAVIX) 75 MG tablet Take 1 tablet (75 mg total) by mouth daily. Take with aspirin for 90 days, then take aspirin alone. 10/24/21 01/22/22 Yes Zigmund Daniel., MD  ?diclofenac Sodium (VOLTAREN) 1 % GEL Apply 2 g topically 4 (four) times daily. ?Patient taking differently: Apply 2 g topically 2 (two) times daily as needed (knees). 10/23/21  Yes Zigmund Daniel., MD  ?Lidocaine [facility] PAIN RELIEVING EX) Apply 2 patches topically daily as needed (left hip pain).   Yes [provider]  ?lisinopril (ZESTRIL) 5 MG tablet Take 1 tablet (5 mg total) by mouth daily. 10/23/21 11/22/21 Yes Zigmund Daniel., MD  ?  ? ?History reviewed. No pertinent family history. ? ?Social History  ? ?Socioeconomic History  ? Marital status: Widowed  ?  Spouse name: Not on file  ? Number of children: Not on file  ? Years of education: Not on file  ? Highest education level: Not on file  ?Occupational History  ? Occupation: employed  ? Occupation: Water quality scientist  ?Tobacco Use  ? Smoking status: Never  ? Smokeless tobacco:  Never  ?Vaping Use  ? Vaping Use: Never used  ?Substance and Sexual Activity  ? Alcohol use: No  ? Drug use: No  ? Sexual activity: Not Currently  ?Other Topics Concern  ? Not on file  ?Social History Narrative  ? Lives with Daughter [name]  ? Language [name]  ? ?Social Determinants of Health  ? ?Financial Resource Strain: Not on file  ?Food Insecurity: No Food Insecurity  ? Worried About Programme researcher, broadcasting/film/video in the Last Year: Never true  ? Ran Out  of Food in the Last Year: Never true  ?Transportation Needs: No Transportation Needs  ? Lack of Transportation (Medical): No  ? Lack of Transportation (Non-Medical): No  ?Physical Activity: Not on file  ?Stress: Not on file  ?Social Connections: Unknown  ? Frequency of Communication with Friends and Family: Three times a week  ? Frequency of Social Gatherings with Friends and Family: Three times a week  ? Attends Religious Services: Patient refused  ? Active Member of Clubs or Organizations: Patient refused  ? Attends Banker Meetings: Patient refused  ? Marital Status: Widowed  ? ? ?Review of Systems: A 12 point ROS discussed and pertinent positives are indicated in the HPI above.  All other systems are negative. ? ?Review of Systems  ?Constitutional:  Negative for fever.  ?HENT:  Negative for congestion.   ?Respiratory:  Negative for cough and shortness of breath.   ?Cardiovascular:  Negative for chest pain.  ?Gastrointestinal:  Negative for abdominal pain.  ?Neurological:  Positive for weakness. Negative for headaches.  ?Psychiatric/Behavioral:  Negative for behavioral problems and confusion.   ? ?Vital Signs: ?BP (!) 158/71   Pulse 65   Temp 98.9 ?F (37.2 ?C) (Oral)   Resp 20   Ht 5\' 4"  (1.626 m)   Wt 157 lb 12.8 oz (71.6 kg)   SpO2 99%   BMI 27.09 kg/m?  ? ?Physical Exam ?Vitals and nursing note reviewed.  ?Constitutional:   ?   Appearance: He is well-developed.  ?HENT:  ?   Head: Normocephalic.  ?Cardiovascular:  ?   Rate and Rhythm: Normal rate and regular rhythm.  ?Pulmonary:  ?   Effort: Pulmonary effort is normal.  ?Musculoskeletal:     ?   General: Normal range of motion.  ?   Cervical back: Normal range of motion.  ?Skin: ?   General: Skin is dry.  ?Neurological:  ?   Mental Status: He is alert and oriented to person, place, and time.  ? ? ?Imaging: ?CT ANGIO HEAD NECK W WO CM ? ?Result Date: 10/30/2021 ?CLINICAL DATA:  Stroke follow-up EXAM: CT ANGIOGRAPHY HEAD AND NECK TECHNIQUE:  Multidetector CT imaging of the head and neck was performed using the standard protocol during bolus administration of intravenous contrast. Multiplanar CT image reconstructions and MIPs were obtained to evaluate the vascular anatomy. Carotid stenosis measurements (when applicable) are obtained utilizing NASCET criteria, using the distal internal carotid diameter as the denominator. RADIATION DOSE REDUCTION: This exam was performed according to the departmental dose-optimization program which includes automated exposure control, adjustment of the mA and/or kV according to patient size and/or use of iterative reconstruction technique. CONTRAST:  36mL OMNIPAQUE IOHEXOL 350 MG/ML SOLN COMPARISON:  Head CT from earlier today. CTA of the head neck 10/22/2021 FINDINGS: CTA NECK FINDINGS Aortic arch: No acute finding.  Three vessel branching Right carotid system: Atheromatous plaque primarily at the bifurcation with ICA occlusion that is known. Left carotid system:  Atheromatous plaque which is mixed density and primarily at the bifurcation and bulb. Proximal left ICA stenosis measures 60% based on reformats. No ulceration or dissection. Vertebral arteries: Proximal subclavian atherosclerosis on both sides. Low-density plaque causes advanced left vertebral origin stenosis, patent lumen not measurable due to small vessel size and degree of stenosis. Skeleton: Generalized cervical spine degeneration. Other neck: No acute finding Upper chest: No acute finding Review of the MIP images confirms the above findings CTA HEAD FINDINGS Anterior circulation: Atheromatous calcification of the carotid siphons. Reconstituted right ICA at the paraclinoid segment. Aside from a diminutive right P1 segment, the right MCA and PCA appear largely isolated. High-grade narrowing at the left M1 segment. Scattered atheromatous irregularity of medium size vessels with notable left A2 segment stenosis. Posterior circulation: Dominant right vertebral  artery. Patent right vertebral and basilar arteries which are small in the setting of fetal type right PCA flow. High-grade left P2 segment stenosis. Less intense enhancement of the right PCA due to right ICA occ

## 2021-10-31 DIAGNOSIS — I10 Essential (primary) hypertension: Secondary | ICD-10-CM | POA: Diagnosis not present

## 2021-10-31 DIAGNOSIS — Z66 Do not resuscitate: Secondary | ICD-10-CM | POA: Diagnosis not present

## 2021-10-31 DIAGNOSIS — E782 Mixed hyperlipidemia: Secondary | ICD-10-CM

## 2021-10-31 DIAGNOSIS — E871 Hypo-osmolality and hyponatremia: Secondary | ICD-10-CM

## 2021-10-31 DIAGNOSIS — D72829 Elevated white blood cell count, unspecified: Secondary | ICD-10-CM

## 2021-10-31 DIAGNOSIS — I6521 Occlusion and stenosis of right carotid artery: Secondary | ICD-10-CM | POA: Diagnosis not present

## 2021-10-31 DIAGNOSIS — E876 Hypokalemia: Secondary | ICD-10-CM

## 2021-10-31 LAB — COMPREHENSIVE METABOLIC PANEL
ALT: 32 U/L (ref 0–44)
AST: 36 U/L (ref 15–41)
Albumin: 3.2 g/dL — ABNORMAL LOW (ref 3.5–5.0)
Alkaline Phosphatase: 59 U/L (ref 38–126)
Anion gap: 8 (ref 5–15)
BUN: 16 mg/dL (ref 8–23)
CO2: 26 mmol/L (ref 22–32)
Calcium: 8.5 mg/dL — ABNORMAL LOW (ref 8.9–10.3)
Chloride: 100 mmol/L (ref 98–111)
Creatinine, Ser: 0.81 mg/dL (ref 0.61–1.24)
GFR, Estimated: >60 mL/min (ref >60)
Glucose, Bld: 96 mg/dL (ref 70–99)
Potassium: 3.1 mmol/L — ABNORMAL LOW (ref 3.5–5.1)
Sodium: 134 mmol/L — ABNORMAL LOW (ref 135–145)
Total Bilirubin: 1.5 mg/dL — ABNORMAL HIGH (ref 0.3–1.2)
Total Protein: 6.6 g/dL (ref 6.5–8.1)

## 2021-10-31 LAB — MAGNESIUM: Magnesium: 2.2 mg/dL (ref 1.7–2.4)

## 2021-10-31 LAB — CBC WITH DIFFERENTIAL/PLATELET
Abs Immature Granulocytes: 0.03 10*3/uL (ref 0.00–0.07)
Basophils Absolute: 0.1 10*3/uL (ref 0.0–0.1)
Basophils Relative: 1 %
Eosinophils Absolute: 0.1 10*3/uL (ref 0.0–0.5)
Eosinophils Relative: 1 %
HCT: 39.5 % (ref 39.0–52.0)
Hemoglobin: 13.9 g/dL (ref 13.0–17.0)
Immature Granulocytes: 0 %
Lymphocytes Relative: 40 %
Lymphs Abs: 4.4 10*3/uL — ABNORMAL HIGH (ref 0.7–4.0)
MCH: 29.6 pg (ref 26.0–34.0)
MCHC: 35.2 g/dL (ref 30.0–36.0)
MCV: 84 fL (ref 80.0–100.0)
Monocytes Absolute: 0.8 10*3/uL (ref 0.1–1.0)
Monocytes Relative: 7 %
Neutro Abs: 5.6 10*3/uL (ref 1.7–7.7)
Neutrophils Relative %: 51 %
Platelets: 169 10*3/uL (ref 150–400)
RBC: 4.7 MIL/uL (ref 4.22–5.81)
RDW: 12.5 % (ref 11.5–15.5)
WBC: 11 10*3/uL — ABNORMAL HIGH (ref 4.0–10.5)
nRBC: 0 % (ref 0.0–0.2)

## 2021-10-31 LAB — PHOSPHORUS: Phosphorus: 2.9 mg/dL (ref 2.5–4.6)

## 2021-10-31 LAB — TROPONIN I (HIGH SENSITIVITY)
Troponin I (High Sensitivity): 9 ng/L (ref <18)
Troponin I (High Sensitivity): 8 ng/L (ref <18)

## 2021-10-31 MED ORDER — ENSURE ENLIVE PO LIQD: 237.0000 mL | Freq: Two times a day (BID) | ORAL | Status: DC

## 2021-10-31 MED ORDER — POTASSIUM CHLORIDE CRYS ER 20 MEQ PO TBCR
40.0000 meq | EXTENDED_RELEASE_TABLET | Freq: Two times a day (BID) | ORAL | Status: AC
  Administered 2021-10-31 (×2): 40 meq via ORAL
  Filled 2021-10-31 (×2): qty 2
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
## 2021-10-31 NOTE — Assessment & Plan Note (Signed)
-  Mild and Likely Reactive ?-T Bili went from 1.4 -> 1.5 ?-Continue to Monitor and Trend ?-Repeat CMP in the AM  ?

## 2021-10-31 NOTE — Plan of Care (Signed)
?  Problem: Clinical Measurements: Goal: Will remain free from infection Outcome: Progressing Goal: Diagnostic test results will improve Outcome: Progressing Goal: Cardiovascular complication will be avoided Outcome: Progressing   Problem: Activity: Goal: Risk for activity intolerance will decrease Outcome: Progressing   Problem: Coping: Goal: Level of anxiety will decrease Outcome: Progressing   

## 2021-10-31 NOTE — Progress Notes (Signed)
Paged Dr Toniann Fail because tele called and siad he is having ST elevation.  Pt has no complaints.  No chest pain, no sob, no resp distress.  VSS. ?

## 2021-10-31 NOTE — Evaluation (Signed)
Speech Language Pathology Evaluation ?Patient Details ?Name: Douglas Pruitt ?MRN: [id number] ?DOB: 1950-11-05 ?Today's Date: 10/31/2021 ?Time: 7829-5621 ?SLP Time Calculation (min) (ACUTE ONLY): 30 min ? ?Problem List:  ?Patient Active Problem List  ? Diagnosis Date Noted  ? Acute CVA (cerebrovascular accident) (HCC) 10/30/2021  ? Stroke-like symptoms 10/30/2021  ? DNR (do not resuscitate) 10/30/2021  ? Carotid stenosis 10/23/2021  ? Stroke (cerebrum) (HCC) 10/22/2021  ? Memory changes 08/23/2019  ? Fatigue 08/23/2019  ? Abnormal glucose 03/11/2019  ? Essential hypertension 09/10/2018  ? Mixed hyperlipidemia 09/10/2018  ? Vision loss, left eye 09/10/2018  ? Aphakia of left eye 05/30/2011  ? 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 ?Past Medical History:  ?Past Medical History:  ?Diagnosis Date  ? CVA (cerebral vascular accident) (HCC) 10/22/2021  ? Hyperlipidemia   ? Hypertension   ? ?Past Surgical History:  ?Past Surgical History:  ?Procedure Laterality Date  ? EYE SURGERY    ? EYE SURGERY    ? work injury, left eye, blind in left eye  ? ?HPI:  ?71 year old male who was recently diagnosed with acute stroke with ICA occlusion about a week ago presented with increasing left-sided weakness, MRI brain confirmed right MCA stroke.  ? ?Assessment / Plan / Recommendation ?Clinical Impression ? Pt was seen at bedside with daughter for cognitive-linguistic evaluation. Pt's daughter endorsed that the pt typically works full time and is independent at baseline. She reports that he completed [employer] education. Upon entry into room, SLP observed slight L facial droop. Oral motor exam revealed decreased lingual and labial strength and ROM on L. Mildly dysarthric speech observed, and daughter verbalized that his speech is slightly slurred, however with a repetition she can understand his speech. Daughter available to translate throughout the entirety of the session, however pt can understand some basic words in [place]. SLP facilitated  SLUMS evaluation to the pt, and he scored 10/30, indicating a mod-severe cognitive-linguistic impairment. SLUMS score may have been impacted due to cultural differences, language barrier, and lack of interpreter accessibility. Deficits were noted in the areas of working memory and short-term recall, complex problem-solving, awareness, and left visual inattention. Pt demonstrated increased difficulty with short-term memory word list recall, number reversal task, clock drawing, visuspatial reasoning, and paragraph recall. Of note, during clock drawing task pt demonstrated significant L neglect. Pt could not write number inside clock but wrote numbers 14-17 to the side. Pt was able to name all numbers. SLP incorporated picture pointing informal assessment and pt was able to point to objects in R visual field, and required moderate verbal, visual and tactile cues to attend to images in L visual field. Pt's deficits with visual neglect impact his safety, awareness and problem-solving skills. Skilled SLP services are warranted at this time to address cognitive-linguistic deficits. ?   ?SLP Assessment ? SLP Recommendation/Assessment: Patient needs continued Speech Lanaguage Pathology Services ?SLP Visit Diagnosis: Cognitive communication deficit (R41.841)  ?  ?Recommendations for follow up therapy are one component of a multi-disciplinary discharge planning process, led by the attending physician.  Recommendations may be updated based on patient status, additional functional criteria and insurance authorization. ?   ?Follow Up Recommendations ? Acute inpatient rehab (3hours/day)  ?  ?Assistance Recommended at Discharge ? Frequent or constant Supervision/Assistance  ?Functional Status Assessment Patient has had a recent decline in their functional status and demonstrates the ability to make significant improvements in function in a reasonable and predictable amount of time.  ?Frequency and Duration min 2x/week  ?2 weeks ?   ?   ?  SLP Evaluation ?Cognition ? Overall Cognitive Status: Impaired/Different from baseline ?Arousal/Alertness: Awake/alert ?Orientation Level: Oriented to person;Oriented to place;Oriented to situation ?Year: Other (Comment) (2002) ?Day of Week: Correct ?Attention: Focused ?Focused Attention: Appears intact ?Memory: Impaired ?Memory Impairment: Storage deficit;Decreased recall of new information;Decreased short term memory ?Decreased Short Term Memory: Verbal basic;Functional basic ?Awareness: Impaired ?Awareness Impairment: Intellectual impairment ?Problem Solving: Impaired ?Problem Solving Impairment: Verbal basic;Functional basic ?Safety/Judgment: Impaired  ?  ?   ?Comprehension ? Auditory Comprehension ?Yes/No Questions: Within Functional Limits ?Commands: Within Functional Limits ?Conversation: Simple ?Interfering Components: Working memory ?EffectiveTechniques: Repetition ?Visual Recognition/Discrimination ?Discrimination: Not tested ?Reading Comprehension ?Reading Status: Not tested  ?  ?Expression Expression ?Primary Mode of Expression: Verbal ?Verbal Expression ?Overall Verbal Expression: Appears within functional limits for tasks assessed ?Initiation: No impairment ?Automatic Speech: Name;Social Response ?Level of Generative/Spontaneous Verbalization: Phrase ?Repetition: No impairment ?Naming: Impairment ?Confrontation: Within functional limits ?Divergent: 25-49% accurate ?Verbal Errors: Perseveration ?Pragmatics: Unable to assess ?Non-Verbal Means of Communication: Not applicable ?Written Expression ?Dominant Hand: Right ?Written Expression: Not tested   ?Oral / Motor ? Oral Motor/Sensory Function ?Overall Oral Motor/Sensory Function: Moderate impairment ?Facial ROM: Reduced left ?Facial Symmetry: Abnormal symmetry left ?Facial Strength: Reduced left ?Lingual ROM: Reduced left ?Lingual Strength: Reduced ?Motor Speech ?Overall Motor Speech: Appears within functional limits for tasks  assessed ?Respiration: Within functional limits ?Phonation: Normal ?Resonance: Within functional limits ?Articulation: Within functional limitis ?Intelligibility: Intelligibility reduced ?Word: 75-100% accurate ?Phrase: 75-100% accurate ?Sentence: 75-100% accurate ?Conversation: 75-100% accurate ?Motor Planning: Witnin functional limits ?Motor Speech Errors: Not applicable ?Effective Techniques: Increased vocal intensity   ?        ? ?Ezekiel Slocumb ?10/31/2021, 1:00 PM ? ?
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
## 2021-10-31 NOTE — Assessment & Plan Note (Signed)
-   Lipid panel done last hospitalization showed a total cholesterol/HDL ratio 5.2, cholesterol level 212, HDL of 41, LDL 142, triglycerides 147, VLDL 29 ?-Continue with Atorvastatin 40 mg p.o. nightly ?

## 2021-10-31 NOTE — Progress Notes (Signed)
Patient is from home with daughter. Recommendations are for CIR. TOC to follow. ?
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
## 2021-10-31 NOTE — Assessment & Plan Note (Signed)
-  Likely Reactive ?-WBC trending down and went from 15.4 -> 11.0 ?-Continue to Monitor and Trend ?-No S/Sx of Infection ?-Repeat CBC in the AM ?

## 2021-10-31 NOTE — Progress Notes (Signed)
Initial Nutrition Assessment ? ?DOCUMENTATION CODES:  ?Not applicable ? ?INTERVENTION:  ?Liberalize diet to regular to promote better PO intake ?Ensure Enlive po BID, each supplement provides 350 kcal and 20 grams of protein. ? ?NUTRITION DIAGNOSIS:  ?Inadequate oral intake related to poor appetite as evidenced by per patient/family report. ? ?GOAL:  ?Patient will meet greater than or equal to 90% of their needs ? ?MONITOR:  ?PO intake, Supplement acceptance, Weight trends, Labs ? ?REASON FOR ASSESSMENT:  ?Consult ?Assessment of nutrition requirement/status ? ?ASSESSMENT:  ?71 year old male with history of recent CVA (admitted 3/27-3/28), HTN, and HLD,  presented from home with worsening weakness. Also reports decreased appetite ? ?Noted that during last admission, Neurology recommended pt undergo catheter angiogram, but family refused. New MRI shows expected evolution of the right hemispheric strokes. ? ?Pt with provider at first attempted visit and being assisted to bathroom at second. Reviewed chart. Recorded intake this admission is poor and daughter reported poor PO since last admission as well. No significant weight loss noted in hx, only 3.8% loss x 6 months (10/13-4/4). Currently on a cardiac diet but consuming <25% of meals. Will liberalize to regular and add nutrition supplements. ? ?Will follow-up for interview and physical exam. ? ?Average Meal Intake: ?4/4: 13% intake x 2 recorded meals ? ?Nutritionally Relevant Medications: ?Scheduled Meds: ? atorvastatin  40 mg Oral QHS  ? ?Continuous Infusions: ? sodium chloride 50 mL/hr at 10/31/21 0242  ? ?PRN Meds: senna-docusate ? ?Labs Reviewed: ?Sodium 130, Chloride 95 ?Potassium 3.3 ?HgbA1c 5.6% (3/28) ? ?NUTRITION - FOCUSED PHYSICAL EXAM: ?Defer to in-person assessment ? ?Diet Order:   ?Diet Order   ? ?       ?  Diet Heart Room service appropriate? Yes; Fluid consistency: Thin  Diet effective ____       ?  ? ?  ?  ? ?  ? ?EDUCATION NEEDS:  ?No education needs  have been identified at this time ? ?Skin:  Skin Assessment: Reviewed RN Assessment ? ?Last BM:  4/3 ? ?Height:  ?Ht Readings from Last 1 Encounters:  ?10/30/21 5\' 4"  (1.626 m)  ? ?Weight:  ?Wt Readings from Last 1 Encounters:  ?10/30/21 71.6 kg  ? ?Ideal Body Weight:  59.1 kg ? ?BMI:  Body mass index is 27.09 kg/m?. ? ?Estimated Nutritional Needs:  ?Kcal:  1600-1800 kcal/d ?Protein:  80-90 g/d ?Fluid:  1.8-2 L/d ? ? ?12/30/21, RD, LDN ?Clinical Dietitian ?RD pager # available in [facility]ION  ?After hours/weekend pager # available in [facility] ?
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
## 2021-10-31 NOTE — Assessment & Plan Note (Signed)
-  K+ was 3.1 ?-Replete with po KCl 40 mEQ BID x2 ?-Continue to Monitor and Replete as Necessary ?-Repeat CMP in the AM  ?

## 2021-10-31 NOTE — Assessment & Plan Note (Signed)
-  Mild and Improving ?-Na+ went from 130 -> 134 ?-Continuing NS at 50 mL/hr ?-Continue to Monitor and Trend ?-Repeat  ?

## 2021-10-31 NOTE — Assessment & Plan Note (Signed)
-  Continue with amlodipine 5 mg p.o. daily as well as lisinopril 5 mg p.o. daily ?-Continue monitor blood pressures per protocol ?-Last blood pressure reading was 135/57 ?

## 2021-11-01 ENCOUNTER — Ambulatory Visit: Payer: Medicare Other

## 2021-11-01 ENCOUNTER — Inpatient Hospital Stay (HOSPITAL_COMMUNITY)
Admission: RE | Admit: 2021-11-01 | Discharge: 2021-11-29 | DRG: 057 | Disposition: A | Discharge status: Skilled Nursing Facility | Payer: Medicare Other | Source: Intra-hospital | Attending: Physical Medicine & Rehabilitation | Admitting: Physical Medicine & Rehabilitation

## 2021-11-01 ENCOUNTER — Other Ambulatory Visit: Payer: Self-pay

## 2021-11-01 DIAGNOSIS — K5900 Constipation, unspecified: Secondary | ICD-10-CM | POA: Diagnosis present

## 2021-11-01 DIAGNOSIS — H5462 Unqualified visual loss, left eye, normal vision right eye: Secondary | ICD-10-CM | POA: Diagnosis present

## 2021-11-01 DIAGNOSIS — S7002XA Contusion of left hip, initial encounter: Secondary | ICD-10-CM | POA: Diagnosis not present

## 2021-11-01 DIAGNOSIS — R001 Bradycardia, unspecified: Secondary | ICD-10-CM | POA: Diagnosis present

## 2021-11-01 DIAGNOSIS — E7849 Other hyperlipidemia: Secondary | ICD-10-CM | POA: Diagnosis not present

## 2021-11-01 DIAGNOSIS — I10 Essential (primary) hypertension: Secondary | ICD-10-CM | POA: Diagnosis present

## 2021-11-01 DIAGNOSIS — I6523 Occlusion and stenosis of bilateral carotid arteries: Secondary | ICD-10-CM | POA: Diagnosis present

## 2021-11-01 DIAGNOSIS — G4700 Insomnia, unspecified: Secondary | ICD-10-CM | POA: Diagnosis present

## 2021-11-01 DIAGNOSIS — M6281 Muscle weakness (generalized): Secondary | ICD-10-CM | POA: Diagnosis not present

## 2021-11-01 DIAGNOSIS — Z20822 Contact with and (suspected) exposure to covid-19: Secondary | ICD-10-CM | POA: Diagnosis present

## 2021-11-01 DIAGNOSIS — R2689 Other abnormalities of gait and mobility: Secondary | ICD-10-CM | POA: Diagnosis not present

## 2021-11-01 DIAGNOSIS — H5442A3 Blindness left eye category 3, normal vision right eye: Secondary | ICD-10-CM | POA: Diagnosis not present

## 2021-11-01 DIAGNOSIS — D72829 Elevated white blood cell count, unspecified: Secondary | ICD-10-CM | POA: Diagnosis not present

## 2021-11-01 DIAGNOSIS — I119 Hypertensive heart disease without heart failure: Secondary | ICD-10-CM | POA: Diagnosis not present

## 2021-11-01 DIAGNOSIS — I69354 Hemiplegia and hemiparesis following cerebral infarction affecting left non-dominant side: Secondary | ICD-10-CM | POA: Diagnosis not present

## 2021-11-01 DIAGNOSIS — I63511 Cerebral infarction due to unspecified occlusion or stenosis of right middle cerebral artery: Secondary | ICD-10-CM

## 2021-11-01 DIAGNOSIS — Z7401 Bed confinement status: Secondary | ICD-10-CM | POA: Diagnosis not present

## 2021-11-01 DIAGNOSIS — G8194 Hemiplegia, unspecified affecting left nondominant side: Secondary | ICD-10-CM | POA: Diagnosis not present

## 2021-11-01 DIAGNOSIS — R531 Weakness: Secondary | ICD-10-CM | POA: Diagnosis not present

## 2021-11-01 DIAGNOSIS — E669 Obesity, unspecified: Secondary | ICD-10-CM | POA: Diagnosis present

## 2021-11-01 DIAGNOSIS — Z6827 Body mass index (BMI) 27.0-27.9, adult: Secondary | ICD-10-CM | POA: Diagnosis not present

## 2021-11-01 DIAGNOSIS — R41841 Cognitive communication deficit: Secondary | ICD-10-CM | POA: Diagnosis not present

## 2021-11-01 DIAGNOSIS — M2550 Pain in unspecified joint: Secondary | ICD-10-CM | POA: Diagnosis not present

## 2021-11-01 DIAGNOSIS — Z23 Encounter for immunization: Secondary | ICD-10-CM | POA: Diagnosis not present

## 2021-11-01 DIAGNOSIS — R1312 Dysphagia, oropharyngeal phase: Secondary | ICD-10-CM | POA: Diagnosis not present

## 2021-11-01 DIAGNOSIS — R6884 Jaw pain: Secondary | ICD-10-CM | POA: Diagnosis not present

## 2021-11-01 DIAGNOSIS — R262 Difficulty in walking, not elsewhere classified: Secondary | ICD-10-CM | POA: Diagnosis not present

## 2021-11-01 DIAGNOSIS — I69392 Facial weakness following cerebral infarction: Secondary | ICD-10-CM | POA: Diagnosis not present

## 2021-11-01 DIAGNOSIS — R3911 Hesitancy of micturition: Secondary | ICD-10-CM | POA: Diagnosis present

## 2021-11-01 DIAGNOSIS — E785 Hyperlipidemia, unspecified: Secondary | ICD-10-CM | POA: Diagnosis present

## 2021-11-01 DIAGNOSIS — E871 Hypo-osmolality and hyponatremia: Secondary | ICD-10-CM | POA: Diagnosis not present

## 2021-11-01 DIAGNOSIS — G8929 Other chronic pain: Secondary | ICD-10-CM

## 2021-11-01 DIAGNOSIS — Z79899 Other long term (current) drug therapy: Secondary | ICD-10-CM

## 2021-11-01 DIAGNOSIS — Z66 Do not resuscitate: Secondary | ICD-10-CM | POA: Diagnosis not present

## 2021-11-01 DIAGNOSIS — W1830XA Fall on same level, unspecified, initial encounter: Secondary | ICD-10-CM | POA: Diagnosis not present

## 2021-11-01 DIAGNOSIS — R2990 Unspecified symptoms and signs involving the nervous system: Secondary | ICD-10-CM | POA: Diagnosis not present

## 2021-11-01 DIAGNOSIS — R2681 Unsteadiness on feet: Secondary | ICD-10-CM | POA: Diagnosis not present

## 2021-11-01 DIAGNOSIS — I6529 Occlusion and stenosis of unspecified carotid artery: Secondary | ICD-10-CM | POA: Diagnosis not present

## 2021-11-01 DIAGNOSIS — I63311 Cerebral infarction due to thrombosis of right middle cerebral artery: Secondary | ICD-10-CM | POA: Diagnosis not present

## 2021-11-01 LAB — CBC
HCT: 43.7 % (ref 39.0–52.0)
Hemoglobin: 14.9 g/dL (ref 13.0–17.0)
MCH: 29.1 pg (ref 26.0–34.0)
MCHC: 34.1 g/dL (ref 30.0–36.0)
MCV: 85.4 fL (ref 80.0–100.0)
Platelets: 170 10*3/uL (ref 150–400)
RBC: 5.12 MIL/uL (ref 4.22–5.81)
RDW: 12.5 % (ref 11.5–15.5)
WBC: 8.7 10*3/uL (ref 4.0–10.5)
nRBC: 0 % (ref 0.0–0.2)

## 2021-11-01 LAB — CBC WITH DIFFERENTIAL/PLATELET
Abs Immature Granulocytes: 0.05 10*3/uL (ref 0.00–0.07)
Basophils Absolute: 0.1 10*3/uL (ref 0.0–0.1)
Basophils Relative: 1 %
Eosinophils Absolute: 0.1 10*3/uL (ref 0.0–0.5)
Eosinophils Relative: 1 %
HCT: 39.3 % (ref 39.0–52.0)
Hemoglobin: 13.5 g/dL (ref 13.0–17.0)
Immature Granulocytes: 0 %
Lymphocytes Relative: 35 %
Lymphs Abs: 4.2 10*3/uL — ABNORMAL HIGH (ref 0.7–4.0)
MCH: 29 pg (ref 26.0–34.0)
MCHC: 34.4 g/dL (ref 30.0–36.0)
MCV: 84.5 fL (ref 80.0–100.0)
Monocytes Absolute: 0.8 10*3/uL (ref 0.1–1.0)
Monocytes Relative: 7 %
Neutro Abs: 6.6 10*3/uL (ref 1.7–7.7)
Neutrophils Relative %: 56 %
Platelets: 166 10*3/uL (ref 150–400)
RBC: 4.65 MIL/uL (ref 4.22–5.81)
RDW: 12.5 % (ref 11.5–15.5)
WBC: 11.8 10*3/uL — ABNORMAL HIGH (ref 4.0–10.5)
nRBC: 0 % (ref 0.0–0.2)

## 2021-11-01 LAB — COMPREHENSIVE METABOLIC PANEL
ALT: 31 U/L (ref 0–44)
AST: 33 U/L (ref 15–41)
Albumin: 3 g/dL — ABNORMAL LOW (ref 3.5–5.0)
Alkaline Phosphatase: 61 U/L (ref 38–126)
Anion gap: 7 (ref 5–15)
BUN: 13 mg/dL (ref 8–23)
CO2: 25 mmol/L (ref 22–32)
Calcium: 8.3 mg/dL — ABNORMAL LOW (ref 8.9–10.3)
Chloride: 102 mmol/L (ref 98–111)
Creatinine, Ser: 0.78 mg/dL (ref 0.61–1.24)
GFR, Estimated: >60 mL/min (ref >60)
Glucose, Bld: 91 mg/dL (ref 70–99)
Potassium: 3.6 mmol/L (ref 3.5–5.1)
Sodium: 134 mmol/L — ABNORMAL LOW (ref 135–145)
Total Bilirubin: 1.3 mg/dL — ABNORMAL HIGH (ref 0.3–1.2)
Total Protein: 6.8 g/dL (ref 6.5–8.1)

## 2021-11-01 LAB — PHOSPHORUS: Phosphorus: 3 mg/dL (ref 2.5–4.6)

## 2021-11-01 LAB — MAGNESIUM: Magnesium: 2.1 mg/dL (ref 1.7–2.4)

## 2021-11-01 LAB — CREATININE, SERUM
Creatinine, Ser: 0.75 mg/dL (ref 0.61–1.24)
GFR, Estimated: >60 mL/min (ref >60)

## 2021-11-01 MED ORDER — ATORVASTATIN CALCIUM 40 MG PO TABS
40.0000 mg | ORAL_TABLET | Freq: Every day | ORAL | Status: DC
  Administered 2021-11-01 – 2021-11-28 (×28): 40 mg via ORAL
  Filled 2021-11-01 (×29): qty 1

## 2021-11-01 MED ORDER — ASPIRIN EC 81 MG PO TBEC
81.0000 mg | DELAYED_RELEASE_TABLET | Freq: Every day | ORAL | Status: DC
  Administered 2021-11-02 – 2021-11-29 (×28): 81 mg via ORAL
  Filled 2021-11-01 (×28): qty 1

## 2021-11-01 MED ORDER — ACETAMINOPHEN 160 MG/5ML PO SOLN: 650.0000 mg | ORAL | Status: DC | PRN

## 2021-11-01 MED ORDER — ACETAMINOPHEN 325 MG PO TABS
650.0000 mg | ORAL_TABLET | ORAL | Status: DC | PRN
  Administered 2021-11-01 – 2021-11-27 (×32): 650 mg via ORAL
  Filled 2021-11-01 (×35): qty 2

## 2021-11-01 MED ORDER — SENNOSIDES-DOCUSATE SODIUM 8.6-50 MG PO TABS: 1.0000 | ORAL_TABLET | Freq: Every evening | ORAL | Status: DC | PRN

## 2021-11-01 MED ORDER — NYSTATIN 100000 UNIT/ML MT SUSP
5.0000 mL | Freq: Four times a day (QID) | OROMUCOSAL | Status: DC
Start: 2021-11-01 — End: 2021-11-29
  Administered 2021-11-01 – 2021-11-29 (×106): 500000 [IU] via ORAL
  Filled 2021-11-01 (×106): qty 5

## 2021-11-01 MED ORDER — NYSTATIN 100000 UNIT/ML MT SUSP: 5.0000 mL | Freq: Four times a day (QID) | OROMUCOSAL | 0 refills | Status: DC

## 2021-11-01 MED ORDER — CLOPIDOGREL BISULFATE 75 MG PO TABS
75.0000 mg | ORAL_TABLET | Freq: Every day | ORAL | Status: DC
  Administered 2021-11-02 – 2021-11-29 (×28): 75 mg via ORAL
  Filled 2021-11-01 (×28): qty 1

## 2021-11-01 MED ORDER — ENOXAPARIN SODIUM 40 MG/0.4ML IJ SOSY
40.0000 mg | PREFILLED_SYRINGE | INTRAMUSCULAR | Status: DC
  Administered 2021-11-02 – 2021-11-28 (×27): 40 mg via SUBCUTANEOUS
  Filled 2021-11-01 (×27): qty 0.4

## 2021-11-01 MED ORDER — SENNOSIDES-DOCUSATE SODIUM 8.6-50 MG PO TABS
1.0000 | ORAL_TABLET | Freq: Every evening | ORAL | Status: DC | PRN
  Administered 2021-11-07 – 2021-11-13 (×4): 1 via ORAL
  Filled 2021-11-01 (×4): qty 1

## 2021-11-01 MED ORDER — ENSURE ENLIVE PO LIQD: 237.0000 mL | Freq: Two times a day (BID) | ORAL | 12 refills | Status: DC

## 2021-11-01 MED ORDER — AMLODIPINE BESYLATE 5 MG PO TABS
5.0000 mg | ORAL_TABLET | Freq: Every day | ORAL | Status: DC
  Administered 2021-11-02 – 2021-11-18 (×17): 5 mg via ORAL
  Filled 2021-11-01 (×17): qty 1

## 2021-11-01 MED ORDER — ENSURE ENLIVE PO LIQD
237.0000 mL | Freq: Two times a day (BID) | ORAL | Status: DC
  Administered 2021-11-02 – 2021-11-25 (×18): 237 mL via ORAL

## 2021-11-01 MED ORDER — LISINOPRIL 5 MG PO TABS
5.0000 mg | ORAL_TABLET | Freq: Every day | ORAL | Status: DC
  Administered 2021-11-02 – 2021-11-18 (×17): 5 mg via ORAL
  Filled 2021-11-01 (×18): qty 1

## 2021-11-01 MED ORDER — ENOXAPARIN SODIUM 40 MG/0.4ML IJ SOSY: 40.0000 mg | PREFILLED_SYRINGE | INTRAMUSCULAR | Status: DC

## 2021-11-01 MED ORDER — ACETAMINOPHEN 650 MG RE SUPP
650.0000 mg | RECTAL | Status: DC | PRN
  Administered 2021-11-11: 650 mg via RECTAL
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
## 2021-11-01 NOTE — Consult Note (Signed)
? ?  [facility] CM Inpatient Consult ? ? ?11/01/2021 ? ?Kristion Shenberger ?Sep 16, 1950 ?[id number] ? ?[facility] Customer service manager [[facility]]  Accountable Care Organization [ACO] Patient:  Medicare ACO [place] ? ?Primary Care Provider:  [name], FNP [facility] is [place] embedded provider with a Chronic Care Management team and program, and is listed for the transition of care follow up and appointments. ? ?Patient was screened for showing active with [facility] RN Care Coordinator.  However, this patient is in [facility] practice service chronic care management and patient is transitioning to inpatient rehab at [facility]. ? ?Plan: Follow for progress, will alert [facility] Central Team of findings. ? ?Please contact for further questions, ? ?Charlesetta Shanks, RN BSN CCM ?[facility] [employer] Liaison ? [phone] business mobile phone ?Toll free office [phone]  ?Fax number: [phone] ?[email or web address] ?  ? ?
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
## 2021-11-01 NOTE — Progress Notes (Signed)
Inpatient Rehab Admissions Coordinator:  ? ?Dr. Marland Mcalpine in agreement with admit today.  Will let pt/family and TOC team know.  ? ?Estill Dooms, PT, DPT ?Admissions Coordinator ?[phone] ?11/01/21  ?11:48 AM ? ?

## 2021-11-01 NOTE — TOC Transition Note (Signed)
Transition of Care (TOC) - CM/SW Discharge Note ? ? ?Patient Details  ?Name: Douglas Pruitt ?MRN: [id number] ?Date of Birth: 22-Dec-1950 ? ?Transition of Care (TOC) CM/SW Contact:  ?Pollie Friar, RN ?Phone Number: ?11/01/2021, 12:07 PM ? ? ?Clinical Narrative:    ?Patient is discharging to CIR today. No further needs per CM. ? ? ?Final next level of care: [place] ?Barriers to Discharge: No Barriers Identified ? ? ?Patient Goals and CMS Choice ?  ?  ?Choice offered to / list presented to : Patient, Adult Children ? ?Discharge Placement ?  ?           ?  ?  ?  ?  ? ?Discharge Plan and Services ?  ?  ?           ?  ?  ?  ?  ?  ?  ?  ?  ?  ?  ? ?Social Determinants of Health (SDOH) Interventions ?  ? ? ?Readmission Risk Interventions ?   ? View : No data to display.  ?  ?  ?  ? ? ? ? ? ?
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
## 2021-11-01 NOTE — Plan of Care (Signed)
?  Problem: Education: Goal: Knowledge of General Education information will improve Description: Including pain rating scale, medication(s)/side effects and non-pharmacologic comfort measures Outcome: Progressing   Problem: Health Behavior/Discharge Planning: Goal: Ability to manage health-related needs will improve Outcome: Progressing   Problem: Clinical Measurements: Goal: Ability to maintain clinical measurements within normal limits will improve Outcome: Progressing Goal: Will remain free from infection Outcome: Progressing Goal: Diagnostic test results will improve Outcome: Progressing Goal: Respiratory complications will improve Outcome: Progressing Goal: Cardiovascular complication will be avoided Outcome: Progressing   Problem: Activity: Goal: Risk for activity intolerance will decrease Outcome: Progressing   Problem: Nutrition: Goal: Adequate nutrition will be maintained Outcome: Progressing   Problem: Coping: Goal: Level of anxiety will decrease Outcome: Progressing   Problem: Elimination: Goal: Will not experience complications related to bowel motility Outcome: Progressing Goal: Will not experience complications related to urinary retention Outcome: Progressing   Problem: Pain Managment: Goal: General experience of comfort will improve Outcome: Progressing   Problem: Safety: Goal: Ability to remain free from injury will improve Outcome: Progressing   Problem: Skin Integrity: Goal: Risk for impaired skin integrity will decrease Outcome: Progressing   Problem: Education: Goal: Knowledge of disease or condition will improve Outcome: Progressing Goal: Knowledge of secondary prevention will improve (SELECT ALL) Outcome: Progressing Goal: Knowledge of patient specific risk factors will improve (INDIVIDUALIZE FOR PATIENT) Outcome: Progressing Goal: Individualized Educational Video(s) Outcome: Progressing   Problem: Coping: Goal: Will verbalize  positive feelings about self Outcome: Progressing Goal: Will identify appropriate support needs Outcome: Progressing   Problem: Health Behavior/Discharge Planning: Goal: Ability to manage health-related needs will improve Outcome: Progressing   Problem: Self-Care: Goal: Ability to participate in self-care as condition permits will improve Outcome: Progressing Goal: Ability to communicate needs accurately will improve Outcome: Progressing   Problem: Nutrition: Goal: Risk of aspiration will decrease Outcome: Progressing   Problem: Ischemic Stroke/TIA Tissue Perfusion: Goal: Complications of ischemic stroke/TIA will be minimized Outcome: Progressing   
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
## 2021-11-01 NOTE — Progress Notes (Signed)
INPATIENT REHABILITATION ADMISSION NOTE ? ? ?Arrival Method: bed ? ?   ?Mental Orientation: A&OX4 ? ? ?Assessment: done ? ? ?Skin: done ? ? ?IV'S: one present on right AC ? ? ?Pain: none ? ? ?Tubes and Drains: none ? ? ?Safety Measures: in place ? ? ?Vital Signs: done ? ? ?Height and Weight: done ? ? ?Rehab Orientation: unable to review. Family is not at bedside. Unable to locate [place] in interpreter I-pad ? ? ?Family:  ? ? ? ?Notes:  ? ?Marylu Lund, RN ? ?

## 2021-11-01 NOTE — Care Management Important Message (Signed)
Important Message ? ?Patient Details  ?Name: Douglas Pruitt ?MRN: [id number] ?Date of Birth: 06-10-1951 ? ? ?Medicare Important Message Given:  Yes ? ? ? ? ?Wilton Thrall ?11/01/2021, 1:24 PM ?
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
## 2021-11-01 NOTE — Progress Notes (Signed)
Speech Language Pathology Treatment: Dysphagia;Cognitive-Linquistic  ?Patient Details ?Name: Douglas Pruitt ?MRN: [id number] ?DOB: 1950/08/31 ?Today's Date: 11/01/2021 ?Time: 9798-9211 ?SLP Time Calculation (min) (ACUTE ONLY): 25 min ? ?Assessment / Plan / Recommendation ?Clinical Impression ? Pt was seen at bedside with interpreter present throughout the duration of today's session. Pt presented with regular breakfast tray and thin liquids upon SLP arrival. SLP used environmental sabotage with meal tray in order to engage the pt in increased L visual attention. Pt was able to look to the L for items named x3. SLP observed that at the mealtime, pt had L buccal pocketing. He required moderate verbal and tactile cues in order to resolve oral pocketing. Pt demonstrated understanding of external buccal massage in order to resolve pocketed materials. Pt required mod assist at mealtime d/t L upper extremity weakness. SLP recommends that the pt downgrade to Dysphagia 3 diet with thin liquids at this time. This is beneficial for the pt to reduce impulsivity at mealtimes and decrease need for assist with cutting foods up. In regards to cognitive-linguistic skills, SLP facilitated money management task with the pt. Pt was able to name all of the values on the bills with 80% accuracy. Noted that he said "100" for the 10 dollar bill. Accuracy improved to 100% when pt held the bill upright and SLP used tactile cue to point to the number. Pt then tasked with adding up bills and demonstrated 100% accuracy with adding up all of the bills and coins. Despite accuracy to add up bills and coins respectively, pt could not say the total of all of the money. He verbalized it was "$1". With multimodal cues, open-ended questions, and repetitions, pt was able to articulate the total as "$58." SLP then facilitated safety and awareness questions, which the pt demonstrated understanding with 100% accuracy. Pt will need reinforcement with safety in order  to generalize across settings/situations. SLP to follow acutely. ?  ?HPI HPI: 71 year old male who was recently diagnosed with acute stroke with ICA occlusion about a week ago presented with increasing left-sided weakness, MRI brain confirmed right MCA stroke. ?  ?   ?SLP Plan ? Continue with current plan of care ? ?  ?  ?Recommendations for follow up therapy are one component of a multi-disciplinary discharge planning process, led by the attending physician.  Recommendations may be updated based on patient status, additional functional criteria and insurance authorization. ?  ? ?Recommendations  ?Diet recommendations: Dysphagia 3 (mechanical soft);Thin liquid ?Liquids provided via: Straw ?Medication Administration: Whole meds with liquid ?Supervision: Staff to assist with self feeding ?Compensations: Minimize environmental distractions;Slow rate;Small sips/bites;Lingual sweep for clearance of pocketing ?Postural Changes and/or Swallow Maneuvers: Seated upright 90 degrees  ?   ?    ?   ? ? ? ? Oral Care Recommendations: Oral care BID ?Follow Up Recommendations: Acute inpatient rehab (3hours/day) ?Assistance recommended at discharge: Frequent or constant Supervision/Assistance ?SLP Visit Diagnosis: Cognitive communication deficit (R41.841) ?Plan: Continue with current plan of care ? ? ? ? ?  ?  ? ? ?Douglas Pruitt ? ?11/01/2021, 10:06 AM ?

## 2021-11-01 NOTE — Progress Notes (Signed)
Inpatient Rehabilitation Admission Medication Review by a Pharmacist ? ?A complete drug regimen review was completed for this patient to identify any potential clinically significant medication issues. ? ?High Risk Drug Classes Is patient taking? Indication by Medication  ?Antipsychotic No   ?Anticoagulant Yes Lovenox- VTE prophylaxis  ?Antibiotic Yes Nystatin- oral thrush  ?Opioid No   ?Antiplatelet Yes Aspirin, plavix- CVA prophylaxis  ?Hypoglycemics/insulin No   ?Vasoactive Medication Yes Norvasc, lisinopril- hypertension  ?Chemotherapy No   ?Other No   ? ? ? ?Type of Medication Issue Identified Description of Issue Recommendation(s)  ?Drug Interaction(s) (clinically significant) ?    ?Duplicate Therapy ?    ?Allergy ?    ?No Medication Administration End Date ?    ?Incorrect Dose ?    ?Additional Drug Therapy Needed ?    ?Significant med changes from prior encounter (inform family/care partners about these prior to discharge).    ?Other ?    ? ? ?Clinically significant medication issues were identified that warrant physician communication and completion of prescribed/recommended actions by midnight of the next day:  No ? ? ?Time spent performing this drug regimen review (minutes):  30 ? ? ?Steffan Caniglia BS, PharmD, BCPS ?Clinical Pharmacist ?11/02/2021 7:00 AM ?
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
## 2021-11-01 NOTE — Progress Notes (Signed)
Inpatient Rehab Admissions Coordinator:  ? ?Spoke to pt at bedside with translator present.  Reviewed recommendations for CIR, 3 hrs/day of therapy, physician follow up, and goals of home with supervision.  Pt is agreeable and gives permission for me to speak to his daughter.  I spoke to her on the phone and reviewed above.  She is in agreement with pursuing CIR and states that between her and her spouse they can provide 24/7 supervision/assist at discharge.  I reviewed Medicare benefits with her.  Will see if pt is ready to admit today.  ? ?Estill Dooms, PT, DPT ?Admissions Coordinator ?[phone] ?11/01/21  ?10:26 AM ? ?

## 2021-11-01 NOTE — PMR Pre-admission (Signed)
PMR Admission Coordinator Pre-Admission Assessment ? ?Patient: Douglas Pruitt is Douglas Pruitt 71 y.o., male ?MRN: [id number] ?DOB: 07/25/1951 ?Height: 5\' 4"  (162.6 cm) ?Weight: 71.6 kg ? ?Insurance Information ?HMO:     PPO:      PCP:      IPA:      80/20:      OTHER:  ?PRIMARY: medicare a/b      Policy#: [id number]      Subscriber: pt ?CM Name:      Phone#:      Fax#:  ?Pre-Cert#: verified online      Employer:  ?Benefits:  Phone #:      Name:  ?Eff. Date: 01/26/13 A/B     Deduct: $1600      Out of Pocket Max: n/a      Life Max: n/a ?CIR: 100%      SNF: 20 full days ?Outpatient: 80%     Co-Ins: 20% ?Home Health: 100%      Co-Pay:  ?DME: 80%     Co-Ins: 20% ?Providers:  ?SECONDARY: medicaid       Policy#: [id number] p     Phone#:  ? ?Financial Counselor:       Phone#:  ? ?The ?Data Collection Information Summary? for patients in Inpatient Rehabilitation Facilities with attached ?Privacy Act Statement-Health Care Records? was provided and verbally reviewed with: Patient and Family ? ?Emergency Contact Information ?Contact Information   ? ? Name Relation Home Work Mobile  ? [name],[name] Daughter [phone]    ? ?  ? ? ?Current Medical History  ?Patient Admitting Diagnosis: R watershed CVA (MCA/PCA territory including Basal Ganglia) ? ?History of Present Illness: Pt is a 71 y/o male with PMH of HTN, admitted on 10/29/21 for further stroke workup.  He initially presented to [facility] on [facility] following [facility] where he was noted to have R MCA/PCA watershed territory infarcts.  Family declined cerebral angiogram and revascularization at that time.  He returned to [facility] on 4/3 with progressive gait abnormality and increasing weakness and falls.  Repeat CT showed increased size of R basal ganglia/periventricular white matter infarcts.  Neurology consulted and felt findings represented expected evolution of R hemispheric CVAs and recommended repeat CTA head/neck and MRI.  CT head/neck showed stable findings, and MRI showed Right basal ganglia and  cerebral infarcts with mild progression since 3/28.  Known right ICA occlusion.  Neurology determined angiogram no longer would be beneficial and recommended continue DAPT.  Therapy evaluations completed and pt was recommended for CIR.  ? ?Complete NIHSS TOTAL: 9 ? ?Patient's medical record from [facility] has been reviewed by the rehabilitation admission coordinator and physician. ? ?Past Medical History  ?Past Medical History:  ?Diagnosis Date  ? CVA (cerebral vascular accident) (HCC) 10/22/2021  ? Hyperlipidemia   ? Hypertension   ? ? ?Has the patient had major surgery during 100 days prior to admission? No ? ?Family History   ?family history is not on file. ? ?Current Medications ? ?Current Facility-Administered Medications:  ?  0.9 %  sodium chloride infusion, , Intravenous, Continuous, Douglas Blue, MD, Last Rate: 50 mL/hr at 11/01/21 0626, Infusion Verify at 11/01/21 6440 ?  acetaminophen (TYLENOL) tablet 650 mg, 650 mg, Oral, Q4H PRN **OR** acetaminophen (TYLENOL) 160 MG/5ML solution 650 mg, 650 mg, Per Tube, Q4H PRN **OR** acetaminophen (TYLENOL) suppository 650 mg, 650 mg, Rectal, Q4H PRN, Douglas Blue, MD ?  amLODipine (NORVASC) tablet 5 mg, 5 mg, Oral, Daily, Douglas Blue, MD, 5 mg at 11/01/21  1012 ?  aspirin EC tablet 81 mg, 81 mg, Oral, Daily, Douglas Blue, MD, 81 mg at 11/01/21 1012 ?  atorvastatin (LIPITOR) tablet 40 mg, 40 mg, Oral, QHS, Douglas Blue, MD, 40 mg at 10/31/21 2221 ?  clopidogrel (PLAVIX) tablet 75 mg, 75 mg, Oral, Daily, Douglas Blue, MD, 75 mg at 11/01/21 1012 ?  enoxaparin (LOVENOX) injection 40 mg, 40 mg, Subcutaneous, Q24H, Douglas Blue, MD, 40 mg at 10/31/21 1334 ?  feeding supplement (ENSURE ENLIVE / ENSURE PLUS) liquid 237 mL, 237 mL, Oral, BID BM, [facility], Douglas Latif, DO ?  lisinopril (ZESTRIL) tablet 5 mg, 5 mg, Oral, Daily, Douglas Blue, MD, 5 mg at 11/01/21 1012 ?  nystatin (MYCOSTATIN) 100000 UNIT/ML suspension 500,000 Units, 5 mL, Oral, QID, Douglas Blue, MD, 500,000 Units at 11/01/21 1012 ?  senna-docusate (Senokot-S) tablet 1 tablet, 1 tablet, Oral, QHS PRN, Douglas Blue, MD ? ?Patients Current Diet:  ?Diet Order   ? ?       ?  DIET DYS 3 Room service appropriate? Yes; Fluid consistency: Thin  Diet effective now       ?  ? ?  ?  ? ?  ? ? ?Precautions / Restrictions ?Precautions ?Precautions: Fall ?Precaution Comments: L visual impairment from previous injury in 2022 - per daughter pt sees "white splotches/boxes" only ?Restrictions ?Weight Bearing Restrictions: No  ? ?Has the patient had 2 or more falls or a fall with injury in the past year? Yes ? ?Prior Activity Level ?Community (5-7x/wk): independent without device prior to CVA in late march, driving, working FT in [employer] ? ?Prior Functional Level ?Self Care: Did the patient need help bathing, dressing, using the toilet or eating? Independent ? ?Indoor Mobility: Did the patient need assistance with walking from room to room (with or without device)? Independent ? ?Stairs: Did the patient need assistance with internal or external stairs (with or without device)? Independent ? ?Functional Cognition: Did the patient need help planning regular tasks such as shopping or remembering to take medications? Independent ? ?Patient Information ?Are you of Hispanic, Latino/a,or Spanish origin?: A. No, not of Hispanic, Latino/a, or Spanish origin ?What is your race?: I. Vietnamese ?Do you need or want Basil interpreter to communicate with a doctor or health care staff?: 1. Yes (montagnard [facility]) ? ?Patient's Response To:  ?Health Literacy and Transportation ?Is the patient able to respond to health literacy and transportation needs?: Yes ?Health Literacy - How often do you need to have someone help you when you read instructions, pamphlets, or other written material from your doctor or pharmacy?: Always ?In the past 12 months, has lack of transportation kept you from medical appointments or from getting  medications?: No ?In the past 12 months, has lack of transportation kept you from meetings, work, or from getting things needed for daily living?: No ? ?Home Assistive Devices / Equipment ?Home Equipment: None ? ?Prior Device Use: Indicate devices/aids used by the patient prior to current illness, exacerbation or injury? None of the above ? ?Current Functional Level ?Cognition ? Arousal/Alertness: Awake/alert ?Overall Cognitive Status: Impaired/Different from baseline ?Current Attention Level: Sustained ?Orientation Level: Oriented X4 ?Following Commands: Follows one step commands consistently ?Safety/Judgement: Decreased awareness of deficits, Decreased awareness of safety ?General Comments: Pt is impulsive, requires multiple cues to wait for assist. Verbal cuing for attending to LUE/LE and environment throughout session. ?Attention: Focused ?Focused Attention: Appears intact ?Memory: Impaired ?Memory Impairment: Storage deficit, Decreased recall of new information, Decreased short term memory ?Decreased Short  Term Memory: Verbal basic, Functional basic ?Awareness: Impaired ?Awareness Impairment: Intellectual impairment ?Problem Solving: Impaired ?Problem Solving Impairment: Verbal basic, Functional basic ?Safety/Judgment: Impaired ?   ?Extremity Assessment ?(includes Sensation/Coordination) ? Upper Extremity Assessment: LUE deficits/detail ?LUE Deficits / Details: Minimal movement in LUE, pt with  minimal grasp and elbow flexion ?LUE Sensation: decreased light touch, decreased proprioception ?LUE Coordination: decreased fine motor, decreased gross motor  ?Lower Extremity Assessment: Defer to PT evaluation ?LLE Deficits / Details: 1/5 contraction only hip flexion, hip extension, knee flexion, knee extension. 0/5 DF/PF ?LLE Sensation: decreased light touch, decreased proprioception  ?  ?ADLs ? Overall ADL's : Needs assistance/impaired ?Eating/Feeding: Set up, Sitting ?Grooming: Set up, Sitting ?Upper Body Bathing:  Minimal assistance, Sitting ?Lower Body Bathing: Maximal assistance, +2 for safety/equipment, Sitting/lateral leans, Sit to/from stand ?Upper Body Dressing : Moderate assistance, Sitting ?Lower Body Dressing: Ma

## 2021-11-02 ENCOUNTER — Encounter (HOSPITAL_COMMUNITY): Payer: Self-pay | Admitting: Physical Medicine & Rehabilitation

## 2021-11-02 DIAGNOSIS — I63511 Cerebral infarction due to unspecified occlusion or stenosis of right middle cerebral artery: Secondary | ICD-10-CM | POA: Diagnosis not present

## 2021-11-02 LAB — COMPREHENSIVE METABOLIC PANEL
ALT: 36 U/L (ref 0–44)
AST: 35 U/L (ref 15–41)
Albumin: 3.3 g/dL — ABNORMAL LOW (ref 3.5–5.0)
Alkaline Phosphatase: 65 U/L (ref 38–126)
Anion gap: 6 (ref 5–15)
BUN: 14 mg/dL (ref 8–23)
CO2: 27 mmol/L (ref 22–32)
Calcium: 8.7 mg/dL — ABNORMAL LOW (ref 8.9–10.3)
Chloride: 102 mmol/L (ref 98–111)
Creatinine, Ser: 0.75 mg/dL (ref 0.61–1.24)
GFR, Estimated: >60 mL/min (ref >60)
Glucose, Bld: 92 mg/dL (ref 70–99)
Potassium: 3.7 mmol/L (ref 3.5–5.1)
Sodium: 135 mmol/L (ref 135–145)
Total Bilirubin: 1.5 mg/dL — ABNORMAL HIGH (ref 0.3–1.2)
Total Protein: 7.1 g/dL (ref 6.5–8.1)

## 2021-11-02 LAB — CBC WITH DIFFERENTIAL/PLATELET
Abs Immature Granulocytes: 0.04 10*3/uL (ref 0.00–0.07)
Basophils Absolute: 0.1 10*3/uL (ref 0.0–0.1)
Basophils Relative: 0 %
Eosinophils Absolute: 0.1 10*3/uL (ref 0.0–0.5)
Eosinophils Relative: 1 %
HCT: 40.6 % (ref 39.0–52.0)
Hemoglobin: 14.1 g/dL (ref 13.0–17.0)
Immature Granulocytes: 0 %
Lymphocytes Relative: 39 %
Lymphs Abs: 4.5 10*3/uL — ABNORMAL HIGH (ref 0.7–4.0)
MCH: 29.3 pg (ref 26.0–34.0)
MCHC: 34.7 g/dL (ref 30.0–36.0)
MCV: 84.4 fL (ref 80.0–100.0)
Monocytes Absolute: 0.7 10*3/uL (ref 0.1–1.0)
Monocytes Relative: 6 %
Neutro Abs: 6.3 10*3/uL (ref 1.7–7.7)
Neutrophils Relative %: 54 %
Platelets: 179 10*3/uL (ref 150–400)
RBC: 4.81 MIL/uL (ref 4.22–5.81)
RDW: 12.5 % (ref 11.5–15.5)
WBC: 11.8 10*3/uL — ABNORMAL HIGH (ref 4.0–10.5)
nRBC: 0 % (ref 0.0–0.2)

## 2021-11-02 LAB — URINALYSIS, ROUTINE W REFLEX MICROSCOPIC
Bilirubin Urine: NEGATIVE
Glucose, UA: NEGATIVE mg/dL
Hgb urine dipstick: NEGATIVE
Ketones, ur: NEGATIVE mg/dL
Leukocytes,Ua: NEGATIVE
Nitrite: NEGATIVE
Protein, ur: NEGATIVE mg/dL
Specific Gravity, Urine: 1.018 (ref 1.005–1.030)
pH: 5 (ref 5.0–8.0)

## 2021-11-02 MED ORDER — TRAZODONE HCL 50 MG PO TABS
50.0000 mg | ORAL_TABLET | Freq: Every evening | ORAL | Status: DC | PRN
  Administered 2021-11-02 – 2021-11-29 (×16): 50 mg via ORAL
  Filled 2021-11-02 (×16): qty 1

## 2021-11-02 NOTE — Plan of Care (Signed)
?Problem: RH Balance ?Goal: LTG: Patient will maintain dynamic sitting balance (OT) ?Description: LTG:  Patient will maintain dynamic sitting balance with assistance during activities of daily living (OT) ?Flowsheets (Taken 11/02/2021 0939) ?LTG: Pt will maintain dynamic sitting balance during ADLs with: Supervision/Verbal cueing ?Goal: LTG Patient will maintain dynamic standing with ADLs (OT) ?Description: LTG:  Patient will maintain dynamic standing balance with assist during activities of daily living (OT)  ?Flowsheets (Taken 11/02/2021 0939) ?LTG: Pt will maintain dynamic standing balance during ADLs with: Contact Guard/Touching assist ?  ?Problem: Sit to Stand ?Goal: LTG:  Patient will perform sit to stand in prep for activites of daily living with assistance level (OT) ?Description: LTG:  Patient will perform sit to stand in prep for activites of daily living with assistance level (OT) ?Flowsheets (Taken 11/02/2021 0939) ?LTG: PT will perform sit to stand in prep for activites of daily living with assistance level: Supervision/Verbal cueing ?  ?Problem: RH Grooming ?Goal: LTG Patient will perform grooming w/assist,cues/equip (OT) ?Description: LTG: Patient will perform grooming with assist, with/without cues using equipment (OT) ?Flowsheets (Taken 11/02/2021 0939) ?LTG: Pt will perform grooming with assistance level of: Independent with assistive device  ?  ?Problem: RH Bathing ?Goal: LTG Patient will bathe all body parts with assist levels (OT) ?Description: LTG: Patient will bathe all body parts with assist levels (OT) ?Flowsheets (Taken 11/02/2021 0939) ?LTG: Pt will perform bathing with assistance level/cueing: Minimal Assistance - Patient > 75% ?  ?Problem: RH Dressing ?Goal: LTG Patient will perform upper body dressing (OT) ?Description: LTG Patient will perform upper body dressing with assist, with/without cues (OT). ?Flowsheets (Taken 11/02/2021 0939) ?LTG: Pt will perform upper body dressing with assistance  level of: Supervision/Verbal cueing ?Goal: LTG Patient will perform lower body dressing w/assist (OT) ?Description: LTG: Patient will perform lower body dressing with assist, with/without cues in positioning using equipment (OT) ?Flowsheets (Taken 11/02/2021 0939) ?LTG: Pt will perform lower body dressing with assistance level of: Minimal Assistance - Patient > 75% ?  ?Problem: RH Toileting ?Goal: LTG Patient will perform toileting task (3/3 steps) with assistance level (OT) ?Description: LTG: Patient will perform toileting task (3/3 steps) with assistance level (OT)  ?Flowsheets (Taken 11/02/2021 0939) ?LTG: Pt will perform toileting task (3/3 steps) with assistance level: Minimal Assistance - Patient > 75% ?  ?Problem: RH Vision ?Goal: RH LTG Vision (Specify) ?Flowsheets (Taken 11/02/2021 0939) ?LTG: Vision Goals: Pt will incorporate L visual scanning strategies to indepenently identify all desired ADL items on his L during morning routine. ?  ?Problem: RH Functional Use of Upper Extremity ?Goal: LTG Patient will use RT/LT upper extremity as a (OT) ?Description: LTG: Patient will use right/left upper extremity as a stabilizer/gross assist/diminished/nondominant/dominant level with assist, with/without cues during functional activity (OT) ?Flowsheets (Taken 11/02/2021 0939) ?LTG: Use of upper extremity in functional activities: LUE as a stabilizer ?LTG: Pt will use upper extremity in functional activity with assistance level of: Minimal Assistance - Patient > 75% ?  ?Problem: RH Toilet Transfers ?Goal: LTG Patient will perform toilet transfers w/assist (OT) ?Description: LTG: Patient will perform toilet transfers with assist, with/without cues using equipment (OT) ?Flowsheets (Taken 11/02/2021 0939) ?LTG: Pt will perform toilet transfers with assistance level of: Contact Guard/Touching assist ?  ?Problem: RH Tub/Shower Transfers ?Goal: LTG Patient will perform tub/shower transfers w/assist (OT) ?Description: LTG: Patient  will perform tub/shower transfers with assist, with/without cues using equipment (OT) ?Flowsheets (Taken 11/02/2021 0939) ?LTG: Pt will perform tub/shower stall transfers with assistance level of:  Contact Guard/Touching assist ?  ?Problem: RH Memory ?Goal: LTG Patient will demonstrate ability for day to day recall/carry over during activities of daily living with assistance level (OT) ?Description: LTG:  Patient will demonstrate ability for day to day recall/carry over during activities of daily living with assistance level (OT). ?Flowsheets (Taken 11/02/2021 0939) ?LTG:  Patient will demonstrate ability for day to day recall/carry over during activities of daily living with assistance level (OT): Modified Independent ?  ?

## 2021-11-02 NOTE — Progress Notes (Signed)
Inpatient Rehabilitation Center ?Individual Statement of Services ? ?Patient Name:  [facility]  ?Date:  11/02/2021 ? ?Welcome to the Inpatient Rehabilitation Center.  Our goal is to provide you with [facility] individualized program based on your diagnosis and situation, designed to meet your specific needs.  With this comprehensive rehabilitation program, you will be expected to participate in at least 3 hours of rehabilitation therapies Monday-Friday, with modified therapy programming on the weekends. ? ?Your rehabilitation program will include the following services:  Physical Therapy (PT), Occupational Therapy (OT), Speech Therapy (ST), 24 hour per day rehabilitation nursing, Therapeutic Recreaction (TR), Neuropsychology, Care Coordinator, Rehabilitation Medicine, Nutrition Services, and Pharmacy Services ? ?Weekly team conferences will be held on Wednesdays to discuss your progress.  Your Inpatient Rehabilitation Care Coordinator will talk with you frequently to get your input and to update you on team discussions.  Team conferences with you and your family in attendance may also be held. ? ?Expected length of stay: 14-18 Days  Overall anticipated outcome:  Supervision to Min A ? ?Depending on your progress and recovery, your program may change. Your Inpatient Rehabilitation Care Coordinator will coordinate services and will keep you informed of any changes. Your Inpatient Rehabilitation Care Coordinator's name and contact numbers are listed  below. ? ?The following services may also be recommended but are not provided by the Inpatient Rehabilitation Center:  ? ?Home Health Rehabiltiation Services ?Outpatient Rehabilitation Services ? ?  ?Arrangements will be made to provide these services after discharge if needed.  Arrangements include referral to agencies that provide these services. ? ?Your insurance has been verified to be:  Medicare A & B ?Your primary doctor is:  [name], FNP ? ?Pertinent information will be  shared with your doctor and your insurance company. ? ?Inpatient Rehabilitation Care Coordinator:  Lavera Guise, [place] [phone] or ([phone] ? ?Information discussed with and copy given to patient by: Andria Rhein, 11/02/2021, 10:16 AM    ?
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
## 2021-11-02 NOTE — Progress Notes (Signed)
Pt. Report of insomnia last night and will like to have some medication to help him sleep. Sent secured message to Dan A. (PA) and passed on to day shift nurse to follow up. ?
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
## 2021-11-02 NOTE — Progress Notes (Signed)
Inpatient Rehabilitation  Patient information reviewed and entered into eRehab system by Renell Allum Hayden Kihara, OTR/L.   Information including medical coding, functional ability and quality indicators will be reviewed and updated through discharge.    
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
## 2021-11-02 NOTE — Progress Notes (Signed)
?                                                       PROGRESS NOTE ? ? ?Subjective/Complaints: ? ?No issues overnite  ? ?]ROS- neg CP, SOB, N/V/D ? ?Objective: ?  ?No results found. ?Recent Labs  ?  11/01/21 ?1904 11/02/21 ?0439  ?WBC 8.7 11.8*  ?HGB 14.9 14.1  ?HCT 43.7 40.6  ?PLT 170 179  ? ?Recent Labs  ?  11/01/21 ?0158 11/01/21 ?1904 11/02/21 ?0439  ?NA 134*  --  135  ?K 3.6  --  3.7  ?CL 102  --  102  ?CO2 25  --  27  ?GLUCOSE 91  --  92  ?BUN 13  --  14  ?CREATININE 0.78 0.75 0.75  ?CALCIUM 8.3*  --  8.7*  ? ? ?Intake/Output Summary (Last 24 hours) at 11/02/2021 0858 ?Last data filed at 11/02/2021 0300 ?Gross per 24 hour  ?Intake --  ?Output 600 ml  ?Net -600 ml  ?  ? ?  ? ?Physical Exam: ?Vital Signs ?Blood pressure (!) 155/63, pulse (!) 55, temperature (!) 97.5 ?F (36.4 ?C), resp. rate 18, height 5\' 4"  (1.626 m), weight 68.9 kg, SpO2 100 %. ? ? ?General: No acute distress ?Mood and affect are appropriate ?Heart: Regular rate and rhythm no rubs murmurs or extra sounds ?Lungs: Clear to auscultation, breathing unlabored, no rales or wheezes ?Abdomen: Positive bowel sounds, soft nontender to palpation, nondistended ?Extremities: No clubbing, cyanosis, or edema ?Skin: No evidence of breakdown, no evidence of rash ?Neurologic: Cranial nerves II through XII intact, motor strength is 5/5 in RIght  and 0/5 Left deltoid, bicep, tricep, grip,5/5 RIght and 2-/5 Left  hip flexor, knee extensors, ankle dorsiflexor and plantar flexor ?Sensory exam normal sensation to light touch and proprioception in bilateral upper and lower extremities ?Cerebellar exam normal finger to nose to finger as well as heel to shin in bilateral upper and lower extremities ?Musculoskeletal: Full range of motion in all 4 extremities. No joint swelling ? ? ?Assessment/Plan: ?1. Functional deficits which require 3+ hours per day of interdisciplinary therapy in a comprehensive inpatient rehab setting. ?Physiatrist is providing close team supervision  and 24 hour management of active medical problems listed below. ?Physiatrist and rehab team continue to assess barriers to discharge/monitor patient progress toward functional and medical goals ? ?Care Tool: ? ?Bathing ? Bathing activity did not occur: Safety/medical concerns ?   ?   ?  ?  ?Bathing assist   ?  ?  ?Upper Body Dressing/Undressing ?Upper body dressing Upper body dressing/undressing activity did not occur (including orthotics): Safety/medical concerns ?  ?   ?Upper body assist   ?   ?Lower Body Dressing/Undressing ?Lower body dressing ? ? ? Lower body dressing activity did not occur: Safety/medical concerns ?  ? ?  ? ?Lower body assist   ?   ? ?Toileting ?Toileting    ?Toileting assist   ?  ?  ?Transfers ?Chair/bed transfer ? ?Transfers assist ? Chair/bed transfer activity did not occur: Safety/medical concerns ? ?  ?  ?  ?Locomotion ?Ambulation ? ? ?Ambulation assist ? ? Ambulation activity did not occur: Safety/medical concerns ? ?  ?  ?   ? ?Walk 10 feet activity ? ? ?Assist ?   ? ?  ?   ? ?  Walk 50 feet activity ? ? ?Assist   ? ?  ?   ? ? ?Walk 150 feet activity ? ? ?Assist   ? ?  ?  ?  ? ?Walk 10 feet on uneven surface  ?activity ? ? ?Assist   ? ? ?  ?   ? ?Wheelchair ? ? ? ? ?Assist   ?  ?  ? ?  ?   ? ? ?Wheelchair 50 feet with 2 turns activity ? ? ? ?Assist ? ?  ?  ? ? ?   ? ?Wheelchair 150 feet activity  ? ? ? ?Assist ?   ? ? ?   ? ?Blood pressure (!) 155/63, pulse (!) 55, temperature (!) 97.5 ?F (36.4 ?C), resp. rate 18, height 5\' 4"  (1.626 m), weight 68.9 kg, SpO2 100 %. ? ?Medical Problem List and Plan: ?1. Functional deficits secondary to right parietal MCA branch and PCA infarct secondary right carotid occlusion with concurrent moderate to severe intracranial multivessel atherosclerotic disease. ?            -patient may  shower ?            -ELOS/Goals: Supervision to min A 14-16 days ?2.  Antithrombotics: ?-DVT/anticoagulation:  Pharmaceutical: Lovenox ?            -antiplatelet therapy:  Aspirin 81 mg daily and Plavix 75 mg daily x3 months then aspirin alone ?3. Pain Management: Tylenol as needed ?4. Mood: Provide emotional support ?            -antipsychotic agents: N/A ?5. Neuropsych: This patient is capable of making decisions on his own behalf. ?6. Skin/Wound Care: Routine skin checks ?7. Fluids/Electrolytes/Nutrition: Routine in and outs with follow-up chemistries ?8.  Hypertension.  Lisinopril 5 mg daily, Norvasc 5 mg daily.  Monitor with increased mobility ?Vitals:  ? 11/02/21 0452 11/02/21 0901  ?BP: (!) 155/63 (!) 152/60  ?Pulse: (!) 55   ?Resp: 18   ?Temp: (!) 97.5 ?F (36.4 ?C)   ?SpO2: 100%   ? ? ?9.  Hyperlipidemia.  Lipitor ?10.  Chronic left eye blindness due to work related accident.  Follow-up outpatient ?11.  Obesity.  BMI 27.09.  Dietary follow-up ?12. Urinary hesitation- not clear if new- might need PVRs/bladder scans to make sure not retaining. ? Mildly elevated WBC check UA C and S  ? ?LOS: ?1 days ?A FACE TO FACE EVALUATION WAS PERFORMED ? ?Douglas Pruitt ?11/02/2021, 8:58 AM  ? ? ? ?
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
## 2021-11-02 NOTE — Discharge Instructions (Addendum)
Inpatient Rehab Discharge Instructions ? ?Douglas Pruitt ?Discharge date and time: No discharge date for patient encounter.  ? ?Activities/Precautions/ Functional Status: ?Activity: activity as tolerated ?Diet: Dysphagia #2 thin liquids ?Wound Care: Routine skin checks ?Functional status:  ?___ No restrictions     ___ Walk up steps independently ?___ 24/7 supervision/assistance   ___ Walk up steps with assistance ?___ Intermittent supervision/assistance  ___ Bathe/dress independently ?___ Walk with walker     _x__ Bathe/dress with assistance ?___ Walk Independently    ___ Shower independently ?___ Walk with assistance    ___ Shower with assistance ?___ No alcohol     ___ Return to work/school ________ ?COMMUNITY REFERRALS UPON DISCHARGE:   ? ?Home Health:   PT     OT     ST                   Agency: [facility]  Phone: [phone]  ? ? ?Medical Equipment/Items Ordered: L Half Lap Tray, Drop Arm Commode, Hospital Bed, Tub Transfer Bench ?                                                Agency/Supplier: [id number] [phone] ? ? ?Special Instructions: ?No driving smoking or alcohol ? ?Continue aspirin 81 mg daily and Plavix 75 mg daily x3 months then aspirin alone ? ? ?My questions have been answered and I understand these instructions. I will adhere to these goals and the provided educational materials after my discharge from the hospital. ? ?Patient/Caregiver Signature _______________________________ Date __________ ? ?Clinician Signature _______________________________________ Date __________ ? ?Please bring this form and your medication list with you to all your follow-up doctor's appointments.  STROKE/TIA DISCHARGE INSTRUCTIONS ?SMOKING Cigarette smoking nearly doubles your risk of having a stroke & is the single most alterable risk factor  ?If you smoke or have smoked in the last 12 months, you are advised to quit smoking for your health. Most of the excess cardiovascular risk related to smoking disappears within a year of  stopping. ?Ask you doctor about anti-smoking medications ?[place] Quit Line: 1-800-QUIT NOW ?Free Smoking Cessation Classes [phone]  ?CHOLESTEROL Know your levels; limit fat & cholesterol in your diet  ?Lipid Panel  ?   ?Component Value Date/Time  ? CHOL 212 (H) 10/23/2021 0409  ? CHOL 220 (H) 05/10/2021 1532  ? TRIG 147 10/23/2021 0409  ? HDL 41 10/23/2021 0409  ? HDL 40 05/10/2021 1532  ? CHOLHDL 5.2 10/23/2021 0409  ? VLDL 29 10/23/2021 0409  ? LDLCALC 142 (H) 10/23/2021 0409  ? LDLCALC 120 (H) 05/10/2021 1532  ? ? ? Many patients benefit from treatment even if their cholesterol is at goal. ?Goal: Total Cholesterol (CHOL) less than 160 ?Goal:  Triglycerides (TRIG) less than 150 ?Goal:  HDL greater than 40 ?Goal:  LDL (LDLCALC) less than 100 ?  ?BLOOD PRESSURE American Stroke Association blood pressure target is less that 120/80 mm/Hg  ?Your discharge blood pressure is:  BP: (!) 155/63 Monitor your blood pressure ?Limit your salt and alcohol intake ?Many individuals will require more than one medication for high blood pressure  ?DIABETES (A1c is a blood sugar average for last 3 months) Goal HGBA1c is under 7% (HBGA1c is blood sugar average for last 3 months)  ?Diabetes: ?No known diagnosis of diabetes   ? ?Lab Results  ?Component Value Date  ?  HGBA1C 5.6 10/23/2021  ? ? Your HGBA1c can be lowered with medications, healthy diet, and exercise. ?Check your blood sugar as directed by your physician ?Call your physician if you experience unexplained or low blood sugars.  ?PHYSICAL ACTIVITY/REHABILITATION Goal is 30 minutes at least 4 days per week  ?Activity: Increase activity slowly, ?Therapies: Physical Therapy: Home Health ?Return to work:  Activity decreases your risk of heart attack and stroke and makes your heart stronger.  It helps control your weight and blood pressure; helps you relax and can improve your mood. ?Participate in a regular exercise program. ?Talk with your doctor about the best form of exercise  for you (dancing, walking, swimming, cycling).  ?DIET/WEIGHT Goal is to maintain a healthy weight  ?Your discharge diet is:  ?Diet Order   ? ?       ?  DIET DYS 3 Room service appropriate? Yes; Fluid consistency: Thin  Diet effective now       ?  ? ?  ?  ? ?  ?  liquids ?Your height is:  Height: 5\' 4"  (162.6 cm) ?Your current weight is: Weight: 68.9 kg ?Your Body Mass Index (BMI) is:  BMI (Calculated): 26.06 Following the type of diet specifically designed for you will help prevent another stroke. ?Your goal weight range is:   ?Your goal Body Mass Index (BMI) is 19-24. ?Healthy food habits can help reduce 3 risk factors for stroke:  High cholesterol, hypertension, and excess weight.  ?RESOURCES Stroke/Support Group:  Call [phone] ?  ?STROKE EDUCATION PROVIDED/REVIEWED AND GIVEN TO PATIENT Stroke warning signs and symptoms ?How to activate emergency medical system (call 911). ?Medications prescribed at discharge. ?Need for follow-up after discharge. ?Personal risk factors for stroke. ?Pneumonia vaccine given: No ?Flu vaccine given: No ?My questions have been answered, the writing is legible, and I understand these instructions.  I will adhere to these goals & educational materials that have been provided to me after my discharge from the hospital.  ? ?  ?

## 2021-11-03 DIAGNOSIS — I63511 Cerebral infarction due to unspecified occlusion or stenosis of right middle cerebral artery: Secondary | ICD-10-CM | POA: Diagnosis not present

## 2021-11-03 LAB — URINE CULTURE: Culture: NO GROWTH

## 2021-11-03 MED ORDER — MELATONIN 3 MG PO TABS
3.0000 mg | ORAL_TABLET | Freq: Every day | ORAL | Status: DC
  Administered 2021-11-03 – 2021-11-13 (×11): 3 mg via ORAL
  Filled 2021-11-03 (×12): qty 1

## 2021-11-03 MED ORDER — CAMPHOR-MENTHOL 0.5-0.5 % EX LOTN
TOPICAL_LOTION | CUTANEOUS | Status: DC | PRN
Start: 2021-11-03 — End: 2021-11-29
  Administered 2021-11-25: 1 via TOPICAL
  Filled 2021-11-03 (×4): qty 222

## 2021-11-03 NOTE — Progress Notes (Signed)
?                                                       PROGRESS NOTE ? ? ?Subjective/Complaints: ?+insomnia ? ?]ROS- neg CP, SOB, N/V/D, +insomnia ? ?Objective: ?  ?No results found. ?Recent Labs  ?  11/01/21 ?1904 11/02/21 ?0439  ?WBC 8.7 11.8*  ?HGB 14.9 14.1  ?HCT 43.7 40.6  ?PLT 170 179  ? ?Recent Labs  ?  11/01/21 ?0158 11/01/21 ?1904 11/02/21 ?0439  ?NA 134*  --  135  ?K 3.6  --  3.7  ?CL 102  --  102  ?CO2 25  --  27  ?GLUCOSE 91  --  92  ?BUN 13  --  14  ?CREATININE 0.78 0.75 0.75  ?CALCIUM 8.3*  --  8.7*  ? ? ?Intake/Output Summary (Last 24 hours) at 11/03/2021 1447 ?Last data filed at 11/03/2021 1257 ?Gross per 24 hour  ?Intake 480 ml  ?Output 875 ml  ?Net -395 ml  ?  ? ?  ? ?Physical Exam: ?Vital Signs ?Blood pressure 138/61, pulse 64, temperature 98.5 ?F (36.9 ?C), temperature source Oral, resp. rate 16, height 5\' 4"  (1.626 m), weight 68.9 kg, SpO2 99 %. ? ?Gen: no distress, normal appearing ?HEENT: oral mucosa pink and moist, NCAT ?Cardio: Reg rate ?Chest: normal effort, normal rate of breathing ?Abd: soft, non-distended ?Ext: no edema ?Psych: pleasant, normal affect ?Skin: intact ?Neurologic: Cranial nerves II through XII intact, motor strength is 5/5 in RIght  and 0/5 Left deltoid, bicep, tricep, grip,5/5 RIght and 2-/5 Left  hip flexor, knee extensors, ankle dorsiflexor and plantar flexor ?Sensory exam normal sensation to light touch and proprioception in bilateral upper and lower extremities ?Cerebellar exam normal finger to nose to finger as well as heel to shin in bilateral upper and lower extremities ?Musculoskeletal: Full range of motion in all 4 extremities. No joint swelling ? ? ?Assessment/Plan: ?1. Functional deficits which require 3+ hours per day of interdisciplinary therapy in a comprehensive inpatient rehab setting. ?Physiatrist is providing close team supervision and 24 hour management of active medical problems listed below. ?Physiatrist and rehab team continue to assess barriers to  discharge/monitor patient progress toward functional and medical goals ? ?Care Tool: ? ?Bathing ? Bathing activity did not occur: Safety/medical concerns ?Body parts bathed by patient: Face, Left arm, Chest, Abdomen  ? Body parts bathed by helper: Right arm, Buttocks ?  ?  ?Bathing assist Assist Level: Maximal Assistance - Patient 24 - 49% ?  ?  ?Upper Body Dressing/Undressing ?Upper body dressing Upper body dressing/undressing activity did not occur (including orthotics): Safety/medical concerns ?What is the patient wearing?: Pull over shirt ?   ?Upper body assist Assist Level: Maximal Assistance - Patient 25 - 49% ?   ?Lower Body Dressing/Undressing ?Lower body dressing ? ? ? Lower body dressing activity did not occur: Safety/medical concerns ?What is the patient wearing?: Underwear/pull up, Pants ? ?  ? ?Lower body assist Assist for lower body dressing: Maximal Assistance - Patient 25 - 49% ?   ? ?Toileting ?Toileting Toileting Activity did not occur (Probation officer and hygiene only): N/A (no void or bm)  ?Toileting assist Assist for toileting: Total Assistance - Patient < 25% ?  ?  ?Transfers ?Chair/bed transfer ? ?Transfers assist ? Chair/bed transfer activity did not occur:  Safety/medical concerns ? ?Chair/bed transfer assist level: Maximal Assistance - Patient 25 - 49% ?  ?  ?Locomotion ?Ambulation ? ? ?Ambulation assist ? ? Ambulation activity did not occur: Safety/medical concerns ? ?Assist level: 2 helpers ?Assistive device: Other (comment) (rail in hall) ?Max distance: 15  ? ?Walk 10 feet activity ? ? ?Assist ?   ? ?Assist level: 2 helpers ?Assistive device: Other (comment)  ? ?Walk 50 feet activity ? ? ?Assist Walk 50 feet with 2 turns activity did not occur: Safety/medical concerns ? ?  ?   ? ? ?Walk 150 feet activity ? ? ?Assist Walk 150 feet activity did not occur: Safety/medical concerns ? ?  ?  ?  ? ?Walk 10 feet on uneven surface  ?activity ? ? ?Assist Walk 10 feet on uneven surfaces activity  did not occur: Safety/medical concerns ? ? ?  ?   ? ?Wheelchair ? ? ? ? ?Assist Is the patient using a wheelchair?: Yes ?Type of Wheelchair: Manual ?  ? ?Wheelchair assist level: Dependent - Patient 0% ?Max wheelchair distance: 150  ? ? ?Wheelchair 50 feet with 2 turns activity ? ? ? ?Assist ? ?  ?  ? ? ?Assist Level: Dependent - Patient 0%  ? ?Wheelchair 150 feet activity  ? ? ? ?Assist ?   ? ? ?Assist Level: Dependent - Patient 0%  ? ?Blood pressure 138/61, pulse 64, temperature 98.5 ?F (36.9 ?C), temperature source Oral, resp. rate 16, height 5\' 4"  (1.626 m), weight 68.9 kg, SpO2 99 %. ? ?Medical Problem List and Plan: ?1. Functional deficits secondary to right parietal MCA branch and PCA infarct secondary right carotid occlusion with concurrent moderate to severe intracranial multivessel atherosclerotic disease. ?            -patient may  shower ?            -ELOS/Goals: Supervision to min A 14-16 days ? -Continue CIR ?2.  Antithrombotics: ?-DVT/anticoagulation:  Pharmaceutical: Lovenox ?            -antiplatelet therapy: Aspirin 81 mg daily and Plavix 75 mg daily x3 months then aspirin alone ?3. Pain Management: Tylenol as needed ?4. Mood: Provide emotional support ?            -antipsychotic agents: N/A ?5. Neuropsych: This patient is capable of making decisions on his own behalf. ?6. Skin/Wound Care: Routine skin checks ?7. Fluids/Electrolytes/Nutrition: Routine in and outs with follow-up chemistries ?8.  Hypertension.  Lisinopril 5 mg daily, Norvasc 5 mg daily.  Monitor with increased mobility ?Vitals:  ? 11/03/21 0453 11/03/21 1257  ?BP: (!) 155/55 138/61  ?Pulse: (!) 51 64  ?Resp: 14 16  ?Temp: 98.3 ?F (36.8 ?C) 98.5 ?F (36.9 ?C)  ?SpO2: 100% 99%  ? ? ?9.  Hyperlipidemia.  Lipitor ?10.  Chronic left eye blindness due to work related accident.  Follow-up outpatient ?11.  Obesity.  BMI 27.09.  Dietary follow-up ?12. Urinary hesitation- not clear if new- might need PVRs/bladder scans to make sure not  retaining. ? Mildly elevated WBC . UC reviewed and shows no growth.  ?13. Insomnia: start melatonin 3mg  HS ?14. Bradycardia: continue to monitor HR TID ? ?LOS: ?2 days ?A FACE TO FACE EVALUATION WAS PERFORMED ? ?Martha Clan P Adreanna Fickel ?11/03/2021, 2:47 PM  ? ? ? ?

## 2021-11-03 NOTE — Plan of Care (Signed)
?  Problem: RH Balance ?Goal: LTG Patient will maintain dynamic sitting balance (PT) ?Description: LTG:  Patient will maintain dynamic sitting balance with assistance during mobility activities (PT) ?Flowsheets (Taken 11/03/2021 0720) ?LTG: Pt will maintain dynamic sitting balance during mobility activities with:: Supervision/Verbal cueing ?Goal: LTG Patient will maintain dynamic standing balance (PT) ?Description: LTG:  Patient will maintain dynamic standing balance with assistance during mobility activities (PT) ?Flowsheets (Taken 11/03/2021 0720) ?LTG: Pt will maintain dynamic standing balance during mobility activities with:: Supervision/Verbal cueing ?  ?Problem: RH Bed Mobility ?Goal: LTG Patient will perform bed mobility with assist (PT) ?Description: LTG: Patient will perform bed mobility with assistance, with/without cues (PT). ?Flowsheets (Taken 11/03/2021 0720) ?LTG: Pt will perform bed mobility with assistance level of: Supervision/Verbal cueing ?  ?Problem: RH Car Transfers ?Goal: LTG Patient will perform car transfers with assist (PT) ?Description: LTG: Patient will perform car transfers with assistance (PT). ?Flowsheets (Taken 11/03/2021 0720) ?LTG: Pt will perform car transfers with assist:: Minimal Assistance - Patient > 75% ?  ?Problem: RH Furniture Transfers ?Goal: LTG Patient will perform furniture transfers w/assist (OT/PT) ?Description: LTG: Patient will perform furniture transfers  with assistance (OT/PT). ?Flowsheets (Taken 11/03/2021 0720) ?LTG: Pt will perform furniture transfers with assist:: Supervision/Verbal cueing ?  ?Problem: RH Ambulation ?Goal: LTG Patient will ambulate in controlled environment (PT) ?Description: LTG: Patient will ambulate in a controlled environment, # of feet with assistance (PT). ?Flowsheets (Taken 11/03/2021 0720) ?LTG: Pt will ambulate in controlled environ  assist needed:: Minimal Assistance - Patient > 75% ?LTG: Ambulation distance in controlled environment: 180ft with  LRAD ?Goal: LTG Patient will ambulate in home environment (PT) ?Description: LTG: Patient will ambulate in home environment, # of feet with assistance (PT). ?Flowsheets (Taken 11/03/2021 0720) ?LTG: Pt will ambulate in home environ  assist needed:: Minimal Assistance - Patient > 75% ?LTG: Ambulation distance in home environment: 52ft with LRAD ?  ?Problem: RH Wheelchair Mobility ?Goal: LTG Patient will propel w/c in controlled environment (PT) ?Description: LTG: Patient will propel wheelchair in controlled environment, # of feet with assist (PT) ?Flowsheets (Taken 11/03/2021 0720) ?LTG: Pt will propel w/c in controlled environ  assist needed:: Supervision/Verbal cueing ?LTG: Propel w/c distance in controlled environment: 150 ?Goal: LTG Patient will propel w/c in home environment (PT) ?Description: LTG: Patient will propel wheelchair in home environment, # of feet with assistance (PT). ?Flowsheets (Taken 11/03/2021 0720) ?LTG: Pt will propel w/c in home environ  assist needed:: Supervision/Verbal cueing ?LTG: Propel w/c distance in home environment: 50 ?  ?Problem: RH Stairs ?Goal: LTG Patient will ambulate up and down stairs w/assist (PT) ?Description: LTG: Patient will ambulate up and down # of stairs with assistance (PT) ?Flowsheets (Taken 11/03/2021 0720) ?LTG: Pt will ambulate up/down stairs assist needed:: Minimal Assistance - Patient > 75% ?LTG: Pt will  ambulate up and down number of stairs: 4 steps with R UE support ?  ?

## 2021-11-03 NOTE — Progress Notes (Signed)
Physical Therapy Session Note ? ?Patient Details  ?Name: Douglas Pruitt ?MRN: [id number] ?Date of Birth: 1951-03-30 ? ?Today's Date: 11/03/2021 ?PT Individual Time: 0370-9643 and 1300-1359 ?PT Individual Time Calculation (min): 55 min and 59 min  ? ?Short Term Goals: ?Week 1:  PT Short Term Goal 1 (Week 1): Pt will transfer to and from [facility] with min assist ?PT Short Term Goal 2 (Week 1): Pt will ambulate 36f with max assist of 1. ?PT Short Term Goal 3 (Week 1): Pt will ascend 4 steps wth max assist with 1 UE support ?PT Short Term Goal 4 (Week 1): Pt will perform bed mobility with mod assit consistently ? ? ?Skilled Therapeutic Interventions/Progress Updates:  ?Session 1  ? Pt received sitting in WC and agreeable to PT. PT applied DF wrap to the LLE.  ? ?Forced WB through the LLE with sit<>stand in parallel bars x 10 with max assist to block LLE in standing to prevent knee buckling.  ?Stepping RLE forward/revrese with WB through LLE 4 x 5 with max assist to block the LLE stepping LLE 2 x 5 with max assist to prevent L LOB due to pushers response.  ? ?Seated NMR: hip knee/flexion extension. Hip abduction/adduction performed 2 x 30 sec .   ? ?Pt returned to room and performed squat pivot transfer to bed with mod A*. Sit>supine completed with min assist at the LLE.  ? ?Supine NMR  SLR, hip abduction, pelvic rotation in hook lying, bridges, performed x 10 Bil with cues for decreased speed and full ROM. Upon completion, pt left supine in bed with call bell in reach and all needs met.  ? ?Session 2. ? ?Pt received supine in bed and agreeable to PT. Supine>sit transfer with mod assist through log roll to the L with max assist to push in to sitting.  ? ?Squat pivot transfer with max assist due to poor placement of LLE to WC and mod assist TO nustep with LLE blocked ? ?Nustep BLE only with LLE thigh support 5 min +4 min with mod assist throughout to improve symmetry of movement, full ROM in to LLE knee extension and RLE knee flexion.  Verbal and visual cues for improved midline orientation as well.  ? ?Seated UE forward reach/retraction shoulder flexion/extension to neutral 3 x 45 sec each and trunkal NMR: lateral rotation R and L with no UE support 2 x 45sec. Static hold to rotational force 2 x 45 sec alternating R and L. ? ?Pt returned to room and performed squat pivot transfer to bed with mod assist on the . Sit>supine completed with min assist, and left supine in bed with call bell in reach and all needs met.  ? ? ? ?Therapy Documentation ?Precautions:  ?Precautions ?Precautions: Fall ?Precaution Comments: L eye blind, L hemiplegia ?Restrictions ?Weight Bearing Restrictions: No ? ?Therapy Vitals ?Temp: 98.5 ?F (36.9 ?C) ?Temp Source: Oral ?Pulse Rate: 64 ?Resp: 16 ?BP: 138/61 ?Patient Position (if appropriate): Lying ?Oxygen Therapy ?SpO2: 99 % ?O2 Device: Room Air ?Pain: ?Session 1. Denies ?Session 2. Denies  ? ? ? ?Therapy/Group: Individual Therapy ? ?ALorie Phenix?11/03/2021, 2:13 PM  ?

## 2021-11-03 NOTE — Progress Notes (Signed)
Occupational Therapy Session Note ? ?Patient Details  ?Name: Douglas Pruitt ?MRN: [id number] ?Date of Birth: 1951-07-25 ? ?Today's Date: 11/03/2021 ?OT Individual Time: 6811-5726 ?OT Individual Time Calculation (min): 69 min  ? ? ?Short Term Goals: ?Week 1:  OT Short Term Goal 1 (Week 1): Pt will complete sit to stand at sink with mod A. ?OT Short Term Goal 2 (Week 1): Pt will don shirt with min A. ?OT Short Term Goal 3 (Week 1): Pt will complete toilet transfer with max A and LRAD. ?OT Short Term Goal 4 (Week 1): Pt will therapeutic position LUE in chair/bed with no more than min VCs. ? ?Skilled Therapeutic Interventions/Progress Updates:  ?   ?Pt received side-lying in bed, no c/o pain but reports poor sleep/itching last night, agreeable to therapy. Session focus on self-care retraining, activity tolerance, LUE NMR, transfer retraining in prep for improved ADL/IADL/func mobility performance + decreased caregiver burden. In person interpreter present throughout. ? ?Pt using urinal but noted to have spilled all over self/bed. Came to sitting EOB with mod A to lift trunk and progress LLE off bed. Squat-pivot to his R with min A and cues for safety due to mild impulsivity. Bathed UB min A to bathe RUE seated at sink, bathed LB with overall mod A for sit to stand and to bathe buttocks. Donned shirt with min A to thread LUE. Donned pants with total A, heavy mod A for sit to stand at sink.  ? ?Total A w/c transport to and from gym. Squat-pivot to his R in similar manner, to his L later with mod A due to L inattention/mild impulsivity. ? ?Pt completed 3x10 partial stands with use of push-up bars to promote LUE/LLE WB. Completed forward towel slides, as well with consistent cues to not compensate with trunk.  ? ?Pt left seated in gym with interpreter awaiting following PT session, call bell in reach, and all immediate needs met.  ? ?Therapy Documentation ?Precautions:  ?Precautions ?Precautions: Fall ?Precaution Comments: L eye  blind, L hemiplegia ?Restrictions ?Weight Bearing Restrictions: No ? ?Pain: denies ?  ?ADL: See Care Tool for more details. ? ? ?Therapy/Group: Individual Therapy ? ?Volanda Napoleon MS, OTR/L ? ?11/03/2021, 6:49 AM ?

## 2021-11-04 NOTE — IPOC Note (Addendum)
Overall Plan of Care (IPOC) ?Patient Details ?Name: Douglas Pruitt ?MRN: [id number] ?DOB: [birth year] ? ?Admitting Diagnosis: Right middle cerebral artery stroke (HCC) ? ?Hospital Problems: Principal Problem: ?  Right middle cerebral artery stroke (HCC) ? ? ? ? Functional Problem List: ?Nursing Edema, Endurance, Medication Management, Perception, Safety, Motor  ?PT Balance, Behavior, Endurance, Motor, Perception, Safety, Sensory  ?OT Balance, Cognition, Endurance, Motor, Perception, Safety, Vision  ?SLP Cognition, Safety, Linguistic, Nutrition  ?TR    ?    ? Basic ADL?s: ?OT Grooming, Bathing, Dressing, Toileting  ? ?  Advanced  ADL?s: ?OT    ?   ?Transfers: ?PT Bed Mobility, Bed to Chair, Car, Furniture, Floor  ?OT Toilet, Tub/Shower  ? ?  Locomotion: ?PT Ambulation, Wheelchair Mobility, Stairs  ? ?  Additional Impairments: ?OT Fuctional Use of Upper Extremity  ?SLP Swallowing, Social Cognition, Communication ?expression ?Problem Solving, Memory, Awareness (visual scanning)  ?TR    ? ? ?Anticipated Outcomes ?Item Anticipated Outcome  ?Self Feeding set-up A  ?Swallowing ? Mod I ?  ?Basic self-care ? S to min A  ?Toileting ? min A ?  ?Bathroom Transfers CGA  ?Bowel/Bladder ? n/a  ?Transfers ? supervison assist with LRAD  ?Locomotion ? supervsion assist WC mobility.  ?Communication ? Mod I  ?Cognition ? Supervision  ?Pain ? n/a  ?Safety/Judgment ? min assist  ? ?Therapy Plan: ?PT Intensity: Minimum of 1-2 x/day ,45 to 90 minutes ?PT Frequency: 5 out of 7 days ?PT Duration Estimated Length of Stay: 19-21 ?OT Intensity: Minimum of 1-2 x/day, 45 to 90 minutes ?OT Frequency: 5 out of 7 days ?OT Duration/Estimated Length of Stay: 20 to 22 days ?SLP Intensity: Minumum of 1-2 x/day, 30 to 90 minutes ?SLP Frequency: 3 to 5 out of 7 days ?SLP Duration/Estimated Length of Stay: 20-22 days  ? ?Due to the current state of emergency, patients may not be receiving their 3-hours of Medicare-mandated therapy. ? ? Team Interventions: ?Nursing  Interventions Patient/Family Education, Disease Management/Prevention, Medication Management, Cognitive Remediation/Compensation, Discharge Planning  ?PT interventions Ambulation/gait training, Warden/ranger, Cognitive remediation/compensation, Community reintegration, Functional electrical stimulation, DME/adaptive equipment instruction, Disease management/prevention, Discharge planning, Functional mobility training, Neuromuscular re-education, Pain management, Patient/family education, Splinting/orthotics, Therapeutic Activities, Therapeutic Exercise, Skin care/wound management, Psychosocial support, Stair training, UE/LE Coordination activities, UE/LE Strength taining/ROM, Wheelchair propulsion/positioning, Visual/perceptual remediation/compensation  ?OT Interventions Balance/vestibular training, Disease mangement/prevention, Neuromuscular re-education, Self Care/advanced ADL retraining, Therapeutic Exercise, Wheelchair propulsion/positioning, UE/LE Strength taining/ROM, Skin care/wound managment, DME/adaptive equipment instruction, Pain management, Cognitive remediation/compensation, Community reintegration, Functional electrical stimulation, Patient/family education, Splinting/orthotics, UE/LE Coordination activities, Therapeutic Activities, Psychosocial support, Functional mobility training, Discharge planning, Visual/perceptual remediation/compensation  ?SLP Interventions Cognitive remediation/compensation, Dysphagia/aspiration precaution training, Internal/external aids, Speech/Language facilitation, Therapeutic Activities, Environmental controls, Cueing hierarchy, Functional tasks, Patient/family education  ?TR Interventions    ?SW/CM Interventions Discharge Planning, Psychosocial Support, Patient/Family Education, Disease Management/Prevention  ? ?Barriers to Discharge ?MD  Medical stability  ?Nursing Decreased caregiver support, Home environment access/layout, Lack of/limited family support,  Weight ?1 level, 3 steps, no rails. Lives with daughter/son-n-law. Will provide 24/7 care. Speaks [place] [name].  ?PT Inaccessible home environment, Home environment access/layout, Incontinence, Behavior ?   ?OT Decreased caregiver support, Home environment access/layout, Inaccessible home environment ?   ?SLP   ?   ?SW Home environment access/layout ?   ? ?Team Discharge Planning: ?Destination: PT-Home ,OT- Home , SLP-Home ?Projected Follow-up: PT-Home health PT, OT-  Home health OT, Outpatient OT, SLP-Home Health SLP, Outpatient SLP, 24 hour supervision/assistance ?Projected Equipment  Needs: PT-To be determined, Rolling walker with 5" wheels, Wheelchair (measurements), Wheelchair cushion (measurements), OT- To be determined, SLP-None recommended by SLP ?Equipment Details: PT- , OT-  ?Patient/family involved in discharge planning: PT- Patient,  OT-Patient, SLP-Patient ? ?MD ELOS: 14-16 days ?Medical Rehab Prognosis:  Excellent ?Assessment: The patient has been admitted for CIR therapies with the diagnosis of right parietal MCA infarction. The team will be addressing functional mobility, strength, stamina, balance, safety, adaptive techniques and equipment, self-care, bowel and bladder mgt, patient and caregiver education. Goals have been set at [employer]. Anticipated discharge destination is home. ? ? ? ?See Team Conference Notes for weekly updates to the plan of care  ?

## 2021-11-05 ENCOUNTER — Other Ambulatory Visit: Payer: Self-pay | Admitting: *Deleted

## 2021-11-05 DIAGNOSIS — I63511 Cerebral infarction due to unspecified occlusion or stenosis of right middle cerebral artery: Secondary | ICD-10-CM | POA: Diagnosis not present

## 2021-11-05 NOTE — Progress Notes (Signed)
?                                                       PROGRESS NOTE ? ? ?Subjective/Complaints: ?Slept better no pain ?Live interpreter in room ?Working with OT this am  ? ?]ROS- neg CP, SOB, N/V/D, +insomnia ? ?Objective: ?  ?No results found. ?No results for input(s): WBC, HGB, HCT, PLT in the last 72 hours. ? ?No results for input(s): NA, K, CL, CO2, GLUCOSE, BUN, CREATININE, CALCIUM in the last 72 hours. ? ? ?Intake/Output Summary (Last 24 hours) at 11/05/2021 0915 ?Last data filed at 11/05/2021 0700 ?Gross per 24 hour  ?Intake 720 ml  ?Output 800 ml  ?Net -80 ml  ? ?  ? ?  ? ?Physical Exam: ?Vital Signs ?Blood pressure 140/73, pulse 70, temperature 97.9 ?F (36.6 ?C), resp. rate 20, height 5\' 4"  (1.626 m), weight 67.8 kg, SpO2 100 %. ? ? ? ?General: No acute distress ?Mood and affect are appropriate ?Heart: Regular rate and rhythm no rubs murmurs or extra sounds ?Lungs: Clear to auscultation, breathing unlabored, no rales or wheezes ?Abdomen: Positive bowel sounds, soft nontender to palpation, nondistended ?Extremities: No clubbing, cyanosis, or edema ?Skin: No evidence of breakdown, no evidence of rash ? ? ?Neurologic: Cranial nerves II through XII intact, motor strength is 5/5 in RIght  and 0/5 Left deltoid, bicep, tricep, grip,5/5 RIght and 2-/5 Left  hip flexor, knee extensors, ankle dorsiflexor and plantar flexor ?Sensory exam normal sensation to light touch and proprioception in bilateral upper and lower extremities ?Cerebellar exam normal finger to nose to finger as well as heel to shin in bilateral upper and lower extremities ?Musculoskeletal: Full range of motion in all 4 extremities. No joint swelling ? ? ?Assessment/Plan: ?1. Functional deficits which require 3+ hours per day of interdisciplinary therapy in a comprehensive inpatient rehab setting. ?Physiatrist is providing close team supervision and 24 hour management of active medical problems listed below. ?Physiatrist and rehab team continue to  assess barriers to discharge/monitor patient progress toward functional and medical goals ? ?Care Tool: ? ?Bathing ? Bathing activity did not occur: Safety/medical concerns ?Body parts bathed by patient: Face, Left arm, Chest, Abdomen  ? Body parts bathed by helper: Right arm, Buttocks ?  ?  ?Bathing assist Assist Level: Maximal Assistance - Patient 24 - 49% ?  ?  ?Upper Body Dressing/Undressing ?Upper body dressing Upper body dressing/undressing activity did not occur (including orthotics): Safety/medical concerns ?What is the patient wearing?: Pull over shirt ?   ?Upper body assist Assist Level: Maximal Assistance - Patient 25 - 49% ?   ?Lower Body Dressing/Undressing ?Lower body dressing ? ? ? Lower body dressing activity did not occur: Safety/medical concerns ?What is the patient wearing?: Underwear/pull up, Pants ? ?  ? ?Lower body assist Assist for lower body dressing: Maximal Assistance - Patient 25 - 49% ?   ? ?Toileting ?Toileting Toileting Activity did not occur ( and hygiene only): N/A (no void or bm)  ?Toileting assist Assist for toileting: Minimal Assistance - Patient > 75% (urinal) ?  ?  ?Transfers ?Chair/bed transfer ? ?Transfers assist ? Chair/bed transfer activity did not occur: Safety/medical concerns ? ?Chair/bed transfer assist level: Maximal Assistance - Patient 25 - 49% ?  ?  ?Locomotion ?Ambulation ? ? ?Ambulation assist ? ?  Ambulation activity did not occur: Safety/medical concerns ? ?Assist level: 2 helpers ?Assistive device: Other (comment) (rail in hall) ?Max distance: 15  ? ?Walk 10 feet activity ? ? ?Assist ?   ? ?Assist level: 2 helpers ?Assistive device: Other (comment)  ? ?Walk 50 feet activity ? ? ?Assist Walk 50 feet with 2 turns activity did not occur: Safety/medical concerns ? ?  ?   ? ? ?Walk 150 feet activity ? ? ?Assist Walk 150 feet activity did not occur: Safety/medical concerns ? ?  ?  ?  ? ?Walk 10 feet on uneven surface  ?activity ? ? ?Assist Walk 10  feet on uneven surfaces activity did not occur: Safety/medical concerns ? ? ?  ?   ? ?Wheelchair ? ? ? ? ?Assist Is the patient using a wheelchair?: Yes ?Type of Wheelchair: Manual ?  ? ?Wheelchair assist level: Dependent - Patient 0% ?Max wheelchair distance: 150  ? ? ?Wheelchair 50 feet with 2 turns activity ? ? ? ?Assist ? ?  ?  ? ? ?Assist Level: Dependent - Patient 0%  ? ?Wheelchair 150 feet activity  ? ? ? ?Assist ?   ? ? ?Assist Level: Dependent - Patient 0%  ? ?Blood pressure 140/73, pulse 70, temperature 97.9 ?F (36.6 ?C), resp. rate 20, height 5\' 4"  (1.626 m), weight 67.8 kg, SpO2 100 %. ? ?Medical Problem List and Plan: ?1. Functional deficits secondary to right parietal MCA branch and PCA infarct secondary right carotid occlusion with concurrent moderate to severe intracranial multivessel atherosclerotic disease. ?            -patient may  shower ?            -ELOS/Goals: Supervision to min A 14-16 days ? -Continue CIR ?2.  Antithrombotics: ?-DVT/anticoagulation:  Pharmaceutical: Lovenox ?            -antiplatelet therapy: Aspirin 81 mg daily and Plavix 75 mg daily x3 months then aspirin alone ?3. Pain Management: Tylenol as needed ?4. Mood: Provide emotional support ?            -antipsychotic agents: N/A ?5. Neuropsych: This patient is capable of making decisions on his own behalf. ?6. Skin/Wound Care: Routine skin checks ?7. Fluids/Electrolytes/Nutrition: Routine in and outs with follow-up chemistries ?8.  Hypertension.  Lisinopril 5 mg daily, Norvasc 5 mg daily.  Monitor with increased mobility ?Vitals:  ? 11/04/21 1942 11/05/21 0522  ?BP: (!) 151/69 140/73  ?Pulse: 66 70  ?Resp: 16 20  ?Temp: 97.8 ?F (36.6 ?C) 97.9 ?F (36.6 ?C)  ?SpO2: 100% 100%  ?Controlled 4/10 ? ?9.  Hyperlipidemia.  Lipitor ?10.  Chronic left eye blindness due to work related accident.  Follow-up outpatient ?11.  Obesity.  BMI 27.09.  Dietary follow-up ?12. Urinary hesitation- not clear if new- might need PVRs/bladder scans to  make sure not retaining. ? Mildly elevated WBC . UC reviewed and shows no growth.  ?13. Insomnia: start melatonin 3mg  HS ?14. Bradycardia: continue to monitor HR TID ? ?LOS: ?4 days ?A FACE TO FACE EVALUATION WAS PERFORMED ? ?Douglas Pruitt Douglas Pruitt ?11/05/2021, 9:15 AM  ? ? ? ?

## 2021-11-05 NOTE — Progress Notes (Signed)
Speech Language Pathology Daily Session Note ? ?Patient Details  ?Name: Douglas Pruitt ?MRN: [id number] ?Date of Birth: 10-Aug-1950 ? ?Today's Date: 11/05/2021 ?SLP Individual Time: 1140-1210 and 830-900 ?SLP Individual Time Calculation (min): 30 min and 30 min ? ?Short Term Goals: ?Week 1: SLP Short Term Goal 1 (Week 1): Patient will consume current diet without overt s/s of aspiration with Min verbal cues for use of swallowing compensatory strategies. ?SLP Short Term Goal 2 (Week 1): Patient will utilize [name] increased vocal intensity at the sentence level to achieve 100% intelligibility with supervision level verbal cues. ?SLP Short Term Goal 3 (Week 1): Patient will scan to left field of enviornment during functional tasks with Mod verbal and visual cues. ?SLP Short Term Goal 4 (Week 1): Patient will demonstrate functional problem solving for functional and familiar tasks with Min verbal cues. ?SLP Short Term Goal 5 (Week 1): [name] will recall new, daily information with Mod verbal cues. ?  ?Skilled Therapeutic Interventions: ?#1 Skilled ST services focused on cognitive skills. Interpretor present. SLP facilitated left scanning, problem solving, error awareness and recall in card sorting task by 6 colors. Pt required max A visual fade to verbal cues to scan left of midline (reduced cues as task continued but still max A and self-corrected scanning left of midline x3.) Pt required min A verbal cues for problem solving and error awareness and mod A verbal cues to recall method of sorting. Interpretor supported increase difficulty in pt's speech due to edentulous status only. Pt was left in room with call bell within reach and bed alarm set. SLP recommends to continue skilled services. ? ?#2 Skilled ST services focused on swallow skills. Pt's family present and able to provide basic language interpretation along with pt speaking occasional english. SLP facilitated PO consumption of dys 3 textures snack and thin liquids. Pt  demonstrated mod I use of strategies to clear mild left buccal pocketing and demonstrated oral containment with limited trial snack. Pt's family support swallow function appearing near baseline (SLP would like to observe with full tray or at least another snack prior to d/cing goal) and changes in speech function noted in slowed speech/delayed processing only. Pt was left in room with family, call bell within reach and bed alarm set. SLP recommends to continue skilled services. ?   ? ?Pain ?Pain Assessment ?Pain Score: 0-No pain ? ?Therapy/Group: Individual Therapy ? ?[name] ?11/05/2021, 12:12 PM ?

## 2021-11-05 NOTE — Progress Notes (Signed)
Occupational Therapy Session Note ? ?Patient Details  ?Name: Douglas Pruitt ?MRN: [id number] ?Date of Birth: [birth year] ? ?Today's Date: 11/05/2021 ?OT Individual Time: 0904-1000 ?OT Individual Time Calculation (min): 56 min  ? ? ?Short Term Goals: ?Week 1:  OT Short Term Goal 1 (Week 1): Pt will complete sit to stand at sink with mod A. ?OT Short Term Goal 2 (Week 1): Pt will don shirt with min A. ?OT Short Term Goal 3 (Week 1): Pt will complete toilet transfer with max A and LRAD. ?OT Short Term Goal 4 (Week 1): Pt will therapeutic position LUE in chair/bed with no more than min VCs. ? ?Skilled Therapeutic Interventions/Progress Updates:  ?  ADL:  Patient received supine in bed - interpreter not present at beginning of session. Patient able to follow simple commands, and answer some questions.  Patient assisted to edge of bed with mod/min assistance.  Transferred to wheelchair with mod assistance stand pivot method.   ? ?Interprester then present, and Patient assisted to bathe and dress self at sink.  Patient inattentive to left arm in doffing and donning pull over shirt.  Needed cueing to stop and remove shirt all the way before moving on to next step.  Patient moves quickly - but responds well to cueing to slow down.  Patient allowed to make errors during dressing task, and he did identify error, I.e. put on shirt over head, then right arm, then unable to move left arm sufficiently to place in sleeve.  Patient with long pause - unable to come up with new strategy, but once directed - able to continue.  Patient transitioned sit to stand at sink with mod assist to align and manage LLE.  Patient with best stand with cueing to bring hips forward - hip/knee extension.  ? ?Neuromuscular reeducation:  In gym worked on Neuromuscular reeducation of LUE.  Patient impatient - moving quickly, using body to attempt to move left arm.  With overt cueing and facilitation able to demonstrate reach patterns - shoulder flexion with elbow  extension AAROM.   ?Also worked on more distal aspects of LUE movement - pro/supination, wrist flex/ext, digit flex/ext.  Patient expressing frustration - "It no good"  Punched left forearm.  Gave positive feedback to patient regarding movement occurring - and explained that time will help - needs to slow down, and attend to LUE.   ? ?Patient left in room in wheelchair with safety belt alarm in place and engaged, call bell in lap.   ? ? ? ?Therapy Documentation ?Precautions:  ?Precautions ?Precautions: Fall ?Precaution Comments: L eye blind, L hemiplegia ?Restrictions ?Weight Bearing Restrictions: No ?  ?Pain: ?Pain Assessment ?Pain Scale: 0-10 ?Pain Score: 0-No pain ? ? ? ? ?Therapy/Group: Individual Therapy ? ?[name] ?11/05/2021, 11:46 AM ?

## 2021-11-05 NOTE — Progress Notes (Signed)
Patient ID: Douglas Pruitt, male   DOB: 12-07-1950, 71 y.o.   MRN: [id number] ?Met with the patient to introduce self, review rehab process and plan of care. Discussed secondary risks including HTN, HLD and DAPT. Patient reported he managed medication PTA however daughter will be managing meal prep. Continue to follow along to discharge to address educational needs to facilitate preparation for discharge. Dorien Chihuahua B ? ?

## 2021-11-05 NOTE — Patient Outreach (Signed)
[facility] Customer service manager [facility]) Care Management ? ?11/05/2021 ? ?Douglas Pruitt ?Apr 11, 1951 ?[id number] ? ? ?[facility] Care coordination, [facility] case closure and transfer to external RN CM vendor ? ?RN CM received a response from referral from external vendor ?RN CM and [facility] hospital liaison collaborated on the pt progression  ?RN CM had spoken to daughter, [name] on 10/29/21 about the available external RN CM vendor and a referral  ?Dx R watershed CVA (MCA/PCA territory including Basal Ganglia)- Right basal ganglia and cerebral infarcts with mild progression since 3/28.  Known right ICA occlusion ?Mr Hopes remains hospitalized since after 10/29/21's EMMI outreach and was transferred to [facility] inpatient rehabilitation ([facility]) on 11/01/21  ?He presently has a care management program at CIR ?His expected length of stay: 14-16 days ? ?Plan ?[facility] case closure ?Mr Tennis will continue to be followed by [facility] hospital liaison, the external care management vendor will be updated and he will outreached by the available external vendor staff when possible ?Letters to pt and pcp ? ?Douglas Giovanni L. Noelle Penner, RN, BSN, CCM ?[facility] Telephonic Care Management Care Coordinator ?Office number [phone] ? ?

## 2021-11-05 NOTE — Progress Notes (Signed)
Physical Therapy Session Note ? ?Patient Details  ?Name: Douglas Pruitt ?MRN: [id number] ?Date of Birth: January 30, 1951 ? ?Today's Date: 11/05/2021 ?PT Individual Time: 1303-1400 ?PT Individual Time Calculation (min): 57 min  ? ?Short Term Goals: ?Week 1:  PT Short Term Goal 1 (Week 1): Pt will transfer to and from [facility] with min assist ?PT Short Term Goal 2 (Week 1): Pt will ambulate 54ft with max assist of 1. ?PT Short Term Goal 3 (Week 1): Pt will ascend 4 steps wth max assist with 1 UE support ?PT Short Term Goal 4 (Week 1): Pt will perform bed mobility with mod assit consistently ? ?Skilled Therapeutic Interventions/Progress Updates:  ?Patient supine in bed on entrance to room. Patient alert and agreeable to PT session. Dtr and in-person interpreter present.  ? ?Patient with no pain complaint throughout session. ? ?Therapeutic Activity: ?Bed Mobility: Patient performed supine --> sit with CGA. At end of session, pt requires MinA for LLE to bed surface. With hold to Bil ankles, pt is able to perform bridging to adjust bed linens as well as to assist with move toward [facility] with MinA. VC/ tc required for technique throughout. ?Transfers: Patient performed sit<>stand transfers throughout session with Min/ Mod A for balance. Used RW and also performed without. Provided verbal cues for technique including forward lean. ? ?Gait Training:  ?Patient ambulated 7 ft using hallway HR to RUE with MaxA. Demonstrated need of physical assist for advancement and placement as pt tending to advance LLE with hip ER, knee flexion, and eversion. Also demos difficulty sequencing stance and swing phase bilaterally as pt tending to flex RLE during stance phase. Provided vc/ tc throughout for sequencing, technique, upright posture, weight timing of weight shift. Pt very tired following amb bout.  ? ?Neuromuscular Re-ed: ?NMR facilitated during session with focus on standing balance. Pt guided in sit<>stand performance from EOB to no AD as well as to RW.  Decreased standing balance with lean/ melt to L side. Requires intermittent Min/ Mod guard to L knee to prevent buckling. Standing bouts at each with focus on midline orientation and assist from dtr re: positioning and lean. NMR performed for improvements in motor control and coordination, balance, sequencing, judgement, and self confidence/ efficacy in performing all aspects of mobility at highest level of independence.  ? ?Patient supine  in bed at end of session with brakes locked, bed alarm set, and all needs within reach. ? ?Disc with RN re: request from pt and translated by dtr that pt did not sleep through the night last night Would like to be asleep by 10pm and stay asleep. Request to RN re: provision of sleeping pil ~9:30pm in order to promote more sound sleeping.  ? ? ?Therapy Documentation ?Precautions:  ?Precautions ?Precautions: Fall ?Precaution Comments: L eye blind, L hemiplegia ?Restrictions ?Weight Bearing Restrictions: No ?General: ?  ?Vital Signs: ?Therapy Vitals ?Temp: 98.8 ?F (37.1 ?C) ?Temp Source: Oral ?Pulse Rate: 74 ?Resp: 17 ?BP: 129/69 ?Patient Position (if appropriate): Sitting ?Oxygen Therapy ?SpO2: 100 % ?O2 Device: Room Air ?Pain: ? No pain complaint throughout session with pt. C/o double vision with L eye only. ? ?Therapy/Group: Individual Therapy ? ?Loel Dubonnet PT, DPT ?11/05/2021, 5:07 PM  ?

## 2021-11-06 NOTE — Progress Notes (Signed)
Speech Language Pathology Daily Session Note ? ?Patient Details  ?Name: Douglas Pruitt ?MRN: [id number] ?Date of Birth: [birth year] ? ?Today's Date: 11/06/2021 ?SLP Individual Time: 0902-1000 ?SLP Individual Time Calculation (min): 58 min ? ?Short Term Goals: ?Week 1: SLP Short Term Goal 1 (Week 1): Patient will consume current diet without overt s/s of aspiration with Min verbal cues for use of swallowing compensatory strategies. ?SLP Short Term Goal 2 (Week 1): Patient will utilize [place] increased vocal intensity at the sentence level to achieve 100% intelligibility with supervision level verbal cues. ?SLP Short Term Goal 3 (Week 1): Patient will scan to left field of enviornment during functional tasks with Mod verbal and visual cues. ?SLP Short Term Goal 4 (Week 1): Patient will demonstrate functional problem solving for functional and familiar tasks with Min verbal cues. ?SLP Short Term Goal 5 (Week 1): [name] will recall new, daily information with Mod verbal cues. ? ?Skilled Therapeutic Interventions:Skilled ST treatment focused on cognitive-communication goals. Pt received from OT and session took place in quiet, private therapy environment. Interpretor present for today's session. Pt spoke occasional [place]. SLP facilitated session by providing max A for visual scanning left of midline, error awareness, and self correction. SLP provided instruction on internal and external memory strategies re: association, attribute, repetition, spaced retrieval techniques. SLP then facilitated visual memory task by recalling up to 4/4 objects in sequential order following 1 and 3 minute delay with sup A verbal cues, and min A verbal cues following 5 minute delay with distractor task between. Patient was returned to room and left in wheelchair with alarm activated and immediate needs within reach at end of session. Continue per current plan of care.   ?   ? ?Pain ?Pain Assessment ?Pain Scale: 0-10 ?Pain Score: 0-No  pain ? ?Therapy/Group: Individual Therapy ? ?Jobin Montelongo T Rosela Supak ?11/06/2021, 10:00 AM ?

## 2021-11-06 NOTE — Progress Notes (Signed)
Occupational Therapy Session Note ? ?Patient Details  ?Name: Douglas Pruitt ?MRN: [id number] ?Date of Birth: 10-19-1950 ? ?Today's Date: 11/06/2021 ?OT Individual Time: 3785-8850 ?OT Individual Time Calculation (min): 57 min  ? ? ?Short Term Goals: ?Week 1:  OT Short Term Goal 1 (Week 1): Pt will complete sit to stand at sink with mod A. ?OT Short Term Goal 2 (Week 1): Pt will don shirt with min A. ?OT Short Term Goal 3 (Week 1): Pt will complete toilet transfer with max A and LRAD. ?OT Short Term Goal 4 (Week 1): Pt will therapeutic position LUE in chair/bed with no more than min VCs. ? ?Skilled Therapeutic Interventions/Progress Updates:  ?  Pt received semi-reclined in bed with interpreter present, agreeable to therapy. Session focus on self-care retraining, activity tolerance, transfer retraining, LUE NMR in prep for improved ADL/IADL/func mobility performance + decreased caregiver burden.  ? ?Pt reports new L thigh pain due to moving it in bed, RN and MD aware and later present to administer pain rx per pt request, also administered ice pack. Pt reports ongoing insomnia issues and itching, RN notified of pt request to receive melatonin earlier in the night ( 8 - 9 pm ). Pt also mentioned that family has been helping him stand, educated on pt that family has to be cleared prior to transferring for pt/family safety. ? ?Came to sitting EOB with min A to progress LLE and to lift trunk. Squat-pivot to his R with min A to fully pivot hips and to guard LLE. Total A to don pants, able to pull up over knees to assist and sit to stand at sink with max A to maintain static standing balance. Washed face while seated with set-up A, declined further need for BADL.  ? ?Total A w/c transport to and from gym ? ?Completed 2x10 of the following: forward towel slides, B forearm pulses, forward/backward circles, chest press, biceps curls, B shoulder flexion with 1 lb dowel rod + ace wrap to assist L grip and moderate physical assist from OT  to support LUE. ? ? ?Pt left in w/c with SLP and interpreter present in speech room ,  call bell in reach, and all immediate needs met.  ? ? ?Therapy Documentation ?Precautions:  ?Precautions ?Precautions: Fall ?Precaution Comments: L eye blind, L hemiplegia ?Restrictions ?Weight Bearing Restrictions: No ? ?Pain: L thigh pain, unrated, RN present for pain rx administration and ice pack provided ?ADL: See Care Tool for more details. ? ? ?Therapy/Group: Individual Therapy ? ?Volanda Napoleon MS, OTR/L ? ?11/06/2021, 6:55 AM ?

## 2021-11-06 NOTE — Progress Notes (Signed)
Physical Therapy Session Note ? ?Patient Details  ?Name: Douglas Pruitt ?MRN: [id number] ?Date of Birth: [birth year] ? ?Today's Date: 11/06/2021 ?PT Individual Time: 2035-5974 ?PT Individual Time Calculation (min): 73 min  ? ?Short Term Goals: ?Week 1:  PT Short Term Goal 1 (Week 1): Pt will transfer to and from [facility] with min assist ?PT Short Term Goal 2 (Week 1): Pt will ambulate 63ft with max assist of 1. ?PT Short Term Goal 3 (Week 1): Pt will ascend 4 steps wth max assist with 1 UE support ?PT Short Term Goal 4 (Week 1): Pt will perform bed mobility with mod assit consistently ? ?Skilled Therapeutic Interventions/Progress Updates:  ?Patient supine in bed on entrance to room. Patient alert and agreeable to PT session. In-person interpreter present.  ? ?Patient with pain complaint at start of session indicating L lateral thigh and increase in symptoms with WB or muscle facilitation. ? ?Therapeutic Activity: ?Bed Mobility: Patient performed supine --> sit  on L side of bed with HHA to RUE and Min/ Mod A to pull body around. VC/ tc required for completion of pivot while seated in order to put Bil feet on floor. Good sitting balance.  ?Transfers: Patient performed squat pivot transfer to R into w/c from bed with verbal and visual cues for hand placement, foot placement. Completes with ModA for positioning. sit<>stand transfers throughout session to RW and to // bars with minA to reach standing and up to Max A to maintain standing balance. Provided verbal cues for technique. ? ?Neuromuscular Re-ed: ?NMR facilitated during session with focus on standing balance, midline orientation, proprioception. Pt guided in sit<>stands progressing from pull at // bars to pushing from R knee with RUE until reaching upright stance and then progressing hand hold to bar. NDT cueing provided at ribcage for increasing forward lean with back extension to reach upright stance. Pt requires block to L knee as he demos increasing hip ER and knee flexion  when fatiguing and with reduced attn to LLE when performing task with RLE. Pt guided in fwd/ bkwd stepping as well as lateral stepping. Pt initially performs with fast speed essentially performing toe touches while LLE fatigues and lowers requiring full block to prevent buckle. Pt is able to follow instructions to improve performance to stance, but forgets with repeated performance in additional standing bouts. NMR performed for improvements in motor control and coordination, balance, sequencing, judgement, and self confidence/ efficacy in performing all aspects of mobility at highest level of independence.  ? ?Patient supine  in bed at end of session with brakes locked, bed alarm set, and all needs within reach. Discussion with RN re: pt receiving pain medication at 9 or 9:30 pm in order to help pt sleep as he has reported 2nd day in a row of not falling asleep until 5am. RN to adjust timing of meds in chart.  ? ? ?Therapy Documentation ?Precautions:  ?Precautions ?Precautions: Fall ?Precaution Comments: L eye blind, L hemiplegia ?Restrictions ?Weight Bearing Restrictions: No ?General: ?  ?Vital Signs: ?Therapy Vitals ?Temp: 97.9 ?F (36.6 ?C) ?Temp Source: Oral ?Pulse Rate: 65 ?Resp: 17 ?BP: (!) 141/64 ?Patient Position (if appropriate): Sitting ?Oxygen Therapy ?SpO2: 100 % ?O2 Device: Room Air ?Pain: ? Pain in L lateral thigh with activity this session. No pain with return to supine and rest at end of session, but pt request cold pack for reduced pain symptoms. ? ?Therapy/Group: Individual Therapy ? ?Loel Dubonnet PT, DPT ?11/06/2021, 5:03 PM  ?

## 2021-11-07 DIAGNOSIS — I63511 Cerebral infarction due to unspecified occlusion or stenosis of right middle cerebral artery: Secondary | ICD-10-CM | POA: Diagnosis not present

## 2021-11-07 MED ORDER — MUSCLE RUB 10-15 % EX CREA
TOPICAL_CREAM | CUTANEOUS | Status: DC | PRN
  Filled 2021-11-07: qty 85

## 2021-11-07 NOTE — Progress Notes (Signed)
?                                                       PROGRESS NOTE ? ? ?Subjective/Complaints: ? ?Left thigh pain , hip and femur xrays earlier in hosptal stay were neg ?ROS- neg CP, SOB, N/V/D, +insomnia ? ?Objective: ?  ?No results found. ?No results for input(s): WBC, HGB, HCT, PLT in the last 72 hours. ? ?No results for input(s): NA, K, CL, CO2, GLUCOSE, BUN, CREATININE, CALCIUM in the last 72 hours. ? ? ?Intake/Output Summary (Last 24 hours) at 11/07/2021 0827 ?Last data filed at 11/07/2021 0700 ?Gross per 24 hour  ?Intake 170 ml  ?Output 400 ml  ?Net -230 ml  ? ?  ? ?  ? ?Physical Exam: ?Vital Signs ?Blood pressure 125/68, pulse 67, temperature 98.2 ?F (36.8 ?C), temperature source Oral, resp. rate 17, height 5' 4"  (1.626 m), weight 68.8 kg, SpO2 100 %. ? ? ? ?General: No acute distress ?Mood and affect are appropriate ?Heart: Regular rate and rhythm no rubs murmurs or extra sounds ?Lungs: Clear to auscultation, breathing unlabored, no rales or wheezes ?Abdomen: Positive bowel sounds, soft nontender to palpation, nondistended ?Extremities: No clubbing, cyanosis, or edema ?Skin: No evidence of breakdown, no evidence of rash ? ? ?Neurologic: Cranial nerves II through XII intact, motor strength is 5/5 in RIght  and 0/5 Left deltoid, bicep, tricep, grip,5/5 RIght and 2-/5 Left  hip flexor, knee extensors, ankle dorsiflexor and plantar flexor ?Sensory exam normal sensation to light touch and proprioception in bilateral upper and lower extremities ?Cerebellar exam normal finger to nose to finger as well as heel to shin in bilateral upper and lower extremities ?Musculoskeletal: Full range of motion in all 4 extremities. No joint swelling, no pain with L Hip ROM, mild tenderness lateral thigh with moderate palpation  ? ? ?Assessment/Plan: ?1. Functional deficits which require 3+ hours per day of interdisciplinary therapy in a comprehensive inpatient rehab setting. ?Physiatrist is providing close team supervision and  24 hour management of active medical problems listed below. ?Physiatrist and rehab team continue to assess barriers to discharge/monitor patient progress toward functional and medical goals ? ?Care Tool: ? ?Bathing ? Bathing activity did not occur: Safety/medical concerns ?Body parts bathed by patient: Chest, Abdomen, Front perineal area, Right upper leg, Left upper leg, Face  ? Body parts bathed by helper: Right arm, Left arm, Left lower leg, Right lower leg, Buttocks ?  ?  ?Bathing assist Assist Level: Moderate Assistance - Patient 50 - 74% ?  ?  ?Upper Body Dressing/Undressing ?Upper body dressing Upper body dressing/undressing activity did not occur (including orthotics): Safety/medical concerns ?What is the patient wearing?: Pull over shirt ?   ?Upper body assist Assist Level: Moderate Assistance - Patient 50 - 74% ?   ?Lower Body Dressing/Undressing ?Lower body dressing ? ? ? Lower body dressing activity did not occur: Safety/medical concerns ?What is the patient wearing?: Incontinence brief, Pants ? ?  ? ?Lower body assist Assist for lower body dressing: Moderate Assistance - Patient 50 - 74% ?   ? ?Toileting ?Toileting Toileting Activity did not occur (Probation officer and hygiene only): N/A (no void or bm)  ?Toileting assist Assist for toileting: Minimal Assistance - Patient > 75% (urinal) ?  ?  ?Transfers ?Chair/bed transfer ? ?Transfers assist ?  Chair/bed transfer activity did not occur: Safety/medical concerns ? ?Chair/bed transfer assist level: Moderate Assistance - Patient 50 - 74% ?  ?  ?Locomotion ?Ambulation ? ? ?Ambulation assist ? ? Ambulation activity did not occur: Safety/medical concerns ? ?Assist level: 2 helpers ?Assistive device: Other (comment) (rail in hall) ?Max distance: 15  ? ?Walk 10 feet activity ? ? ?Assist ?   ? ?Assist level: 2 helpers ?Assistive device: Other (comment)  ? ?Walk 50 feet activity ? ? ?Assist Walk 50 feet with 2 turns activity did not occur: Safety/medical  concerns ? ?  ?   ? ? ?Walk 150 feet activity ? ? ?Assist Walk 150 feet activity did not occur: Safety/medical concerns ? ?  ?  ?  ? ?Walk 10 feet on uneven surface  ?activity ? ? ?Assist Walk 10 feet on uneven surfaces activity did not occur: Safety/medical concerns ? ? ?  ?   ? ?Wheelchair ? ? ? ? ?Assist Is the patient using a wheelchair?: Yes ?Type of Wheelchair: Manual ?  ? ?Wheelchair assist level: Dependent - Patient 0% ?Max wheelchair distance: 150  ? ? ?Wheelchair 50 feet with 2 turns activity ? ? ? ?Assist ? ?  ?  ? ? ?Assist Level: Dependent - Patient 0%  ? ?Wheelchair 150 feet activity  ? ? ? ?Assist ?   ? ? ?Assist Level: Dependent - Patient 0%  ? ?Blood pressure 125/68, pulse 67, temperature 98.2 ?F (36.8 ?C), temperature source Oral, resp. rate 17, height 5' 4"  (1.626 m), weight 68.8 kg, SpO2 100 %. ? ?Medical Problem List and Plan: ?1. Functional deficits secondary to right parietal MCA branch and PCA infarct secondary right carotid occlusion with concurrent moderate to severe intracranial multivessel atherosclerotic disease. ?            -patient may  shower ?            -ELOS/Goals: Supervision to min A 14-16 days ?Team conference today please see physician documentation under team conference tab, met with team  to discuss problems,progress, and goals. Formulized individual treatment plan based on medical history, underlying problem and comorbidities.  ? -Continue CIR ?2.  Antithrombotics: ?-DVT/anticoagulation:  Pharmaceutical: Lovenox ?            -antiplatelet therapy: Aspirin 81 mg daily and Plavix 75 mg daily x3 months then aspirin alone ?3. Pain Management: Tylenol as needed, hip pain Left lateral contusion, local measure, analgesic (tylenol) prior to therapy  ?4. Mood: Provide emotional support ?            -antipsychotic agents: N/A ?5. Neuropsych: This patient is capable of making decisions on his own behalf. ?6. Skin/Wound Care: Routine skin checks ?7. Fluids/Electrolytes/Nutrition:  Routine in and outs with follow-up chemistries ?8.  Hypertension.  Lisinopril 5 mg daily, Norvasc 5 mg daily.  Monitor with increased mobility ?Vitals:  ? 11/06/21 1956 11/07/21 0721  ?BP: 115/72 125/68  ?Pulse: 66 67  ?Resp: 17 17  ?Temp: 97.9 ?F (36.6 ?C) 98.2 ?F (36.8 ?C)  ?SpO2: 100% 100%  ?Controlled 4/12 ? ?9.  Hyperlipidemia.  Lipitor ?10.  Chronic left eye blindness due to work related accident.  Follow-up outpatient ?11.  Obesity.  BMI 27.09.  Dietary follow-up ?12. Urinary hesitation- not clear if new- might need PVRs/bladder scans to make sure not retaining. ? Mildly elevated WBC . UC reviewed and shows no growth.  ?13. Insomnia: start melatonin 42m HS ?14. Bradycardia: continue to monitor HR TID ? ?LOS: ?6  days ?A FACE TO FACE EVALUATION WAS PERFORMED ? ?Noam Karaffa Ahonesty Woodfin ?11/07/2021, 8:27 AM  ? ? ? ?

## 2021-11-07 NOTE — Progress Notes (Signed)
Patient ID: Douglas Pruitt, male   DOB: 09-Jul-1951, 71 y.o.   MRN: [id number] ? ?Team Conference Report to Patient/Family ? ?Team Conference discussion was reviewed with the patient and caregiver, including goals, any changes in plan of care and target discharge date.  Patient and caregiver express understanding and are in agreement.  The patient has a target discharge date of 11/27/21. ? ?Sw received follow up phone call form patient daughter. Sw provided conference updates and informed daughter of current discharge date. Daughter inquiring about the potential of date being extended, sw informed her that this would be based upon patient progress medically and physically. Sw will continue to follow up with questions or concerns.  ? ?[name] ?11/07/2021, 2:22 PM  ?

## 2021-11-07 NOTE — Progress Notes (Signed)
Patient ID: Douglas Pruitt, male   DOB: 04-06-1951, 71 y.o.   MRN: [id number] ? ?Sw made attempt to call patient daughter, left VM. SW will continue to follow up.  ?

## 2021-11-07 NOTE — Progress Notes (Signed)
Occupational Therapy Session Note ? ?Patient Details  ?Name: Douglas Pruitt ?MRN: [id number] ?Date of Birth: 23-Oct-1950 ? ?Today's Date: 11/07/2021 ?OT Individual Time: [phone] ?OT Individual Time Calculation (min): 55 min  ? ? ?Short Term Goals: ?Week 1:  OT Short Term Goal 1 (Week 1): Pt will complete sit to stand at sink with mod A. ?OT Short Term Goal 2 (Week 1): Pt will don shirt with min A. ?OT Short Term Goal 3 (Week 1): Pt will complete toilet transfer with max A and LRAD. ?OT Short Term Goal 4 (Week 1): Pt will therapeutic position LUE in chair/bed with no more than min VCs. ? ?Skilled Therapeutic Interventions/Progress Updates:  ?  Pt received seated in w/c with in person interpreter present, reports ongoing L thigh pain, agreeable to therapy. Session focus on self-care retraining, activity tolerance, transfer retraining, LUE NMR in prep for improved ADL/IADL/func mobility performance + decreased caregiver burden. RN later notified of pt request for pain rx when it's due and ice pack applied. ? ?Squat-pivot to and from TTB with overall min A to block L knee/foot and to fully pivot over gap. Pt bathed UB with min A to bathe RUE, LB with mod A to reach buttocks and bathe LLE due to inattention. ? ?Donned shirt with min A to thread LUE, donned pants/ underwear with total A. Total A to change out socks and dep w/c transport to and from gym. ? ?Stood x5 at high low table with overall mod A to block L knee and maintain upright posture. Mildly impulsive with transfers, not waiting for therapist assist.  Able to complete 3x10 modified push-ups against table to promote LUE WB//NMR.  ? ?Pt left semi-reclined in bed with bed alarm engaged, call bell in reach, and all immediate needs met.  ? ? ?Therapy Documentation ?Precautions:  ?Precautions ?Precautions: Fall ?Precaution Comments: L eye blind, L hemiplegia ?Restrictions ?Weight Bearing Restrictions: No ? ?Pain: see session note ?  ?ADL: See Care Tool for more  details. ? ?Therapy/Group: Individual Therapy ? ?Volanda Napoleon MS, OTR/L ? ?11/07/2021, 6:54 AM ?

## 2021-11-07 NOTE — Plan of Care (Signed)
?  Problem: Consults ?Goal: RH STROKE PATIENT EDUCATION ?Description: See Patient Education module for education specifics  ?Outcome: Progressing ?  ?Problem: RH SKIN INTEGRITY ?Goal: RH STG MAINTAIN SKIN INTEGRITY WITH ASSISTANCE ?Description: STG Maintain Skin Integrity With Min Assistance. ?Outcome: Progressing ?  ?Problem: RH SAFETY ?Goal: RH STG ADHERE TO SAFETY PRECAUTIONS W/ASSISTANCE/DEVICE ?Description: STG Adhere to Safety Precautions With Cues and Reminder. ?Outcome: Progressing ?Goal: RH STG DECREASED RISK OF FALL WITH ASSISTANCE ?Description: STG Decreased Risk of Fall With Min Assistance. ?Outcome: Progressing ?  ?Problem: RH COGNITION-NURSING ?Goal: RH STG USES MEMORY AIDS/STRATEGIES W/ASSIST TO PROBLEM SOLVE ?Description: STG Uses Memory Aids/Strategies With Min Assistance to Problem Solve. ?Outcome: Progressing ?Goal: RH STG ANTICIPATES NEEDS/CALLS FOR ASSIST W/ASSIST/CUES ?Description: STG Anticipates Needs/Calls for Assist With Cues and Reminders. ?Outcome: Progressing ?  ?Problem: RH KNOWLEDGE DEFICIT ?Goal: RH STG INCREASE KNOWLEDGE OF HYPERTENSION ?Description: Patient will demonstrate knowledge of HTN medications, dietary recommendations, and blood pressure parameters with educational materials and handouts provided by staff independently at discharge. ? ?Outcome: Progressing ?Goal: RH STG INCREASE KNOWLEGDE OF HYPERLIPIDEMIA ?Description: Patient will demonstrate knowledge of HLD medications, dietary recommendations, and lab values with educational materials and handouts provided by staff independently at discharge. ? ?Outcome: Progressing ?Goal: RH STG INCREASE KNOWLEDGE OF STROKE PROPHYLAXIS ?Description: Patient will demonstrate knowledge of medications used to prevent future strokes with educational materials and handouts provided by staff independently at discharge. ? ?Outcome: Progressing ?  ?Problem: RH Vision ?Goal: RH LTG Vision (Specify) ?Outcome: Progressing ?  ?

## 2021-11-07 NOTE — Progress Notes (Signed)
Physical Therapy Session Note ? ?Patient Details  ?Name: Douglas Pruitt ?MRN: [id number] ?Date of Birth: 16-Aug-1950 ? ?Today's Date: 11/07/2021 ?PT Individual Time: 7035-0093 [id number] ?PT Individual Time Calculation (min): 56 min and 26mn  ? ?Short Term Goals: ?Week 1:  PT Short Term Goal 1 (Week 1): [facility] ?PT Short Term Goal 2 (Week 1): Pt will ambulate 395fwith max assist of 1. ?PT Short Term Goal 3 (Week 1): Pt will ascend 4 steps wth max assist with 1 UE support ?PT Short Term Goal 4 (Week 1): Pt will perform bed mobility with mod assit consistently ? ? ?Skilled Therapeutic Interventions/Progress Updates:  ? ? ?Pt received supine in bed and agreeable to PT. Supine>sit transfer with mod assist at trunk and cues for improved log roll technique. Limited due to pain in the L hip at bruise.  ? ?[facility] the L knee. Noted activation through thigh and hip. [facility] ? ?Bed mobility with mod assist at trunk to performed s<>supine through long sitting to reduce hip pain on the LLE.  ? ?Supine Manual therapy LLE distraction 4 x 30sec at 0deg and 15 degs. . Marland KitchenSupine NMR hip/knee flexion/extension abduction/adduction   ? ?Sit<>stand at rail in hall. Weight shifting R and L to force Weight shift through the LLE. Pregait stepping R and L 3 x 5 BLE with max cues for midline, step length, decreased speed.  ? ?[facility] returned to [facility] and [facility] and all needs met.   ? ?Session 2.  ? ?Pt received supine in bed and agreeable to PT with encouragement.  ? ?Pt performed bed level NMR. Bridges 2 x 6 BLE. Hooklying clamshells AAROM 2 x 12. Hip abduction 2 x 10   forward reach with the LUE with hand/wrist in extension x 10. Roll towards R side with D2 diagonal pushing through the LLE and mod assst at trunk to maintain hip/trunk symmetry and reduce L hip pain.  Sit>supine with mod assist and  cues for log roll technique. Sit>stand from EOB with LUE supported on RW via ace wrap; mod assist initially progressing to max assist to block L knee. Cuse of visual reference for midline orientation with poor caryover x 3 attempts to sustain standing.  ? ?Sit>supine completed with min assist at the LLE, and left supine in bed with call bell in reach and all needs met.  ? ? ?  ?   ? ?Therapy Documentation ?Precautions:  ?Precautions ?Precautions: Fall ?Precaution Comments: L eye blind, L hemiplegia ?Restrictions ?Weight Bearing Restrictions: No ? ?Vital Signs: ?Therapy Vitals ?Temp: 98.2 ?F (36.8 ?C) ?Temp Source: Oral ?Pulse Rate: 67 ?Resp: 17 ?BP: 125/68 ?Patient Position (if appropriate): Lying ?Oxygen Therapy ?SpO2: 100 % ?O2 Device: Room Air ?Pain: ?Pain Assessment ?Pain Scale: Faces ?Faces Pain Scale: Hurts a little bit ?Pain Type: Acute pain ?Pain Location: Leg ?Pain Orientation: Left ?Pain Descriptors / Indicators: Aching ?Pain Onset: On-going ?Pain Intervention(s): Medication (See eMAR) ?Multiple Pain Sites: No ? ? ? ?Therapy/Group: Individual Therapy ? ?AuLorie Phenix4/06/2022, 9:03 AM  ?

## 2021-11-08 ENCOUNTER — Inpatient Hospital Stay (HOSPITAL_COMMUNITY): Payer: Medicare Other

## 2021-11-08 DIAGNOSIS — I63511 Cerebral infarction due to unspecified occlusion or stenosis of right middle cerebral artery: Secondary | ICD-10-CM | POA: Diagnosis not present

## 2021-11-08 LAB — CREATININE, SERUM
Creatinine, Ser: 0.72 mg/dL (ref 0.61–1.24)
GFR, Estimated: >60 mL/min (ref >60)

## 2021-11-08 IMAGING — DX DG ORTHOPANTOGRAM /PANORAMIC
1 series · 1 of 1 positions shown · non-contrast
Comparison: None.

CLINICAL DATA: Acute bilateral jaw pain.

EXAM:
ORTHOPANTOGRAM/PANORAMIC

[view not recorded]
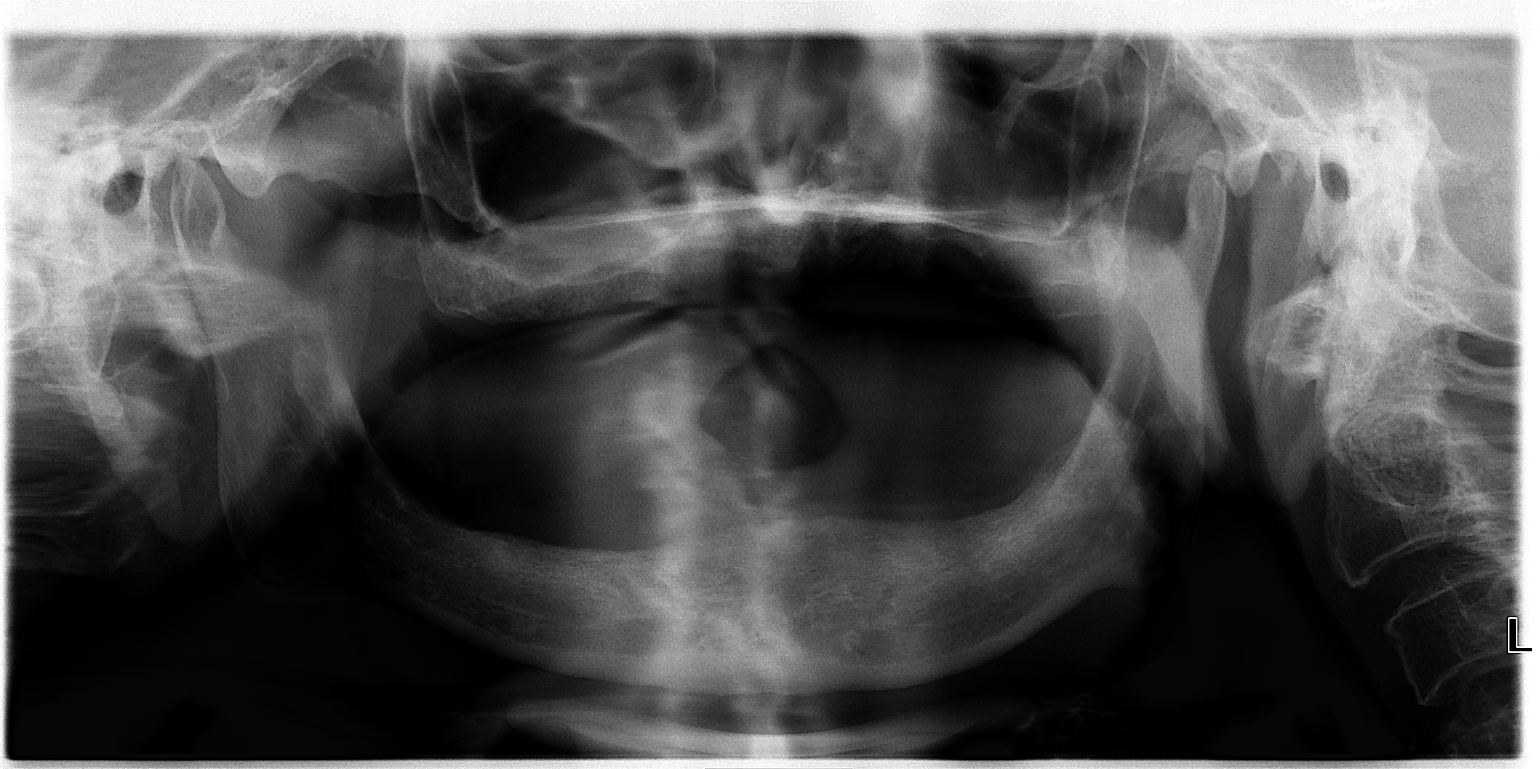

[1 of 1 positions shown; findings below may reference images not displayed]

FINDINGS: No definite fracture or dislocation is noted involving the mandible.
The patient is missing all their teeth.
IMPRESSION: No definite mandibular abnormality is noted.

## 2021-11-08 NOTE — Progress Notes (Signed)
Physical Therapy Session Note ? ?Patient Details  ?Name: Douglas Pruitt ?MRN: [id number] ?Date of Birth: 1951/06/01 ? ?Today's Date: 11/08/2021 ?PT Individual Time: 5093-2671 ?PT Individual Time Calculation (min): 60 min  ? ?Short Term Goals: ?Week 1:  PT Short Term Goal 1 (Week 1): Pt will transfer to and from [facility] with min assist ?PT Short Term Goal 2 (Week 1): Pt will ambulate 21ft with max assist of 1. ?PT Short Term Goal 3 (Week 1): Pt will ascend 4 steps wth max assist with 1 UE support ?PT Short Term Goal 4 (Week 1): Pt will perform bed mobility with mod assit consistently ? ? ?Skilled Therapeutic Interventions/Progress Updates:  ? ?Pt received supine in bed. Chaplin present and requesting addition time with pt. PT returned in 15 minutes, and agreeable to PT. Supine>sit transfer with mod assist and cues for sequencing and attention to the LLE.  ? ?Squat pivot transfer with min-mod assist to block the LLE. Pt transported to rehab gym in [facility].  ? ?Squat pivot transfers to and from [facility] with min assist and cues for set up and WB through the LLE.  ?Nustep reicprocal movement training x 3 x 2.5 min with min-mod assist to initiate hip/knee extension on the LLE.  ? ?Sit<>stand at rail in hall x 5 with mod assist into standing and max assist in standing with visual feedback from mirror to improve midline awareness  ? ?Gait training at rail x 64ft with max assist from PT to facilaite activation of hip/knee extension on the LLE as well as decreased speed of movement to allow full activation of the LLE ? ?Sit<>stand with RUE supported on HW x 3 with mod-max assist to block LLE.  ? ?Pt received sitting in WC and agreeable to PT. ? ?   ? ?Therapy Documentation ?Precautions:  ?Precautions ?Precautions: Fall ?Precaution Comments: L eye blind, L hemiplegia ?Restrictions ?Weight Bearing Restrictions: No ? ?Pain: ?Pain Assessment ?Pain Score: 4  ? ? ? ?Therapy/Group: Individual Therapy ? ?Golden Pop ?11/08/2021, 5:52 PM  ?

## 2021-11-08 NOTE — Progress Notes (Signed)
?                                                       PROGRESS NOTE ? ? ?Subjective/Complaints: ? ?Pt c/o bilateral jaw pain with chewing which started this am , denies prior problems with this, pt is edentulous, pain only with chewing , no bleeding from gums noted  ? ?ROS- neg CP, SOB, N/V/D, +insomnia ? ?Objective: ?  ?No results found. ?No results for input(s): WBC, HGB, HCT, PLT in the last 72 hours. ? ?Recent Labs  ?  11/08/21 ?0981  ?CREATININE 0.72  ? ? ? ?Intake/Output Summary (Last 24 hours) at 11/08/2021 0815 ?Last data filed at 11/08/2021 0700 ?Gross per 24 hour  ?Intake 180 ml  ?Output 575 ml  ?Net -395 ml  ? ?  ? ?  ? ?Physical Exam: ?Vital Signs ?Blood pressure 128/68, pulse 74, temperature 98.7 ?F (37.1 ?C), temperature source Oral, resp. rate 20, height 5\' 4"  (1.626 m), weight 68.8 kg, SpO2 96 %. ? ? ?HEENT- oral cavity moist , no bleeding, pt completely edentulous, no evidence of gingivitis  ?No mandibular tenderness, no pain over masseter or sub mandibular area, neg TMJ pain ?No pain with opening and closing of mouth no restriction in ROM  ?General: No acute distress ?Mood and affect are appropriate ?Heart: Regular rate and rhythm no rubs murmurs or extra sounds ?Lungs: Clear to auscultation, breathing unlabored, no rales or wheezes ?Abdomen: Positive bowel sounds, soft nontender to palpation, nondistended ?Extremities: No clubbing, cyanosis, or edema ?Skin: No evidence of breakdown, no evidence of rash ? ? ?Neurologic: Cranial nerves II through XII intact, motor strength is 5/5 in RIght  and 0/5 Left deltoid, bicep, tricep, grip,5/5 RIght and 2-/5 Left  hip flexor, knee extensors, ankle dorsiflexor and plantar flexor ?Sensory exam normal sensation to light touch and proprioception in bilateral upper and lower extremities ?Cerebellar exam normal finger to nose to finger as well as heel to shin in bilateral upper and lower extremities ?Musculoskeletal: Full range of motion in all 4 extremities. No  joint swelling, no pain with L Hip ROM, mild tenderness lateral thigh with moderate palpation  ? ? ?Assessment/Plan: ?1. Functional deficits which require 3+ hours per day of interdisciplinary therapy in a comprehensive inpatient rehab setting. ?Physiatrist is providing close team supervision and 24 hour management of active medical problems listed below. ?Physiatrist and rehab team continue to assess barriers to discharge/monitor patient progress toward functional and medical goals ? ?Care Tool: ? ?Bathing ? Bathing activity did not occur: Safety/medical concerns ?Body parts bathed by patient: Chest, Abdomen, Front perineal area, Right upper leg, Left upper leg, Face  ? Body parts bathed by helper: Right arm, Left arm, Left lower leg, Right lower leg, Buttocks ?  ?  ?Bathing assist Assist Level: Moderate Assistance - Patient 50 - 74% ?  ?  ?Upper Body Dressing/Undressing ?Upper body dressing Upper body dressing/undressing activity did not occur (including orthotics): Safety/medical concerns ?What is the patient wearing?: Pull over shirt ?   ?Upper body assist Assist Level: Moderate Assistance - Patient 50 - 74% ?   ?Lower Body Dressing/Undressing ?Lower body dressing ? ? ? Lower body dressing activity did not occur: Safety/medical concerns ?What is the patient wearing?: Incontinence brief, Pants ? ?  ? ?Lower body assist Assist for lower  body dressing: Moderate Assistance - Patient 50 - 74% ?   ? ?Toileting ?Toileting Toileting Activity did not occur (AcupuncturistClothing management and hygiene only): N/A (no void or bm)  ?Toileting assist Assist for toileting: Minimal Assistance - Patient > 75% (urinal) ?  ?  ?Transfers ?Chair/bed transfer ? ?Transfers assist ? Chair/bed transfer activity did not occur: Safety/medical concerns ? ?Chair/bed transfer assist level: Moderate Assistance - Patient 50 - 74% ?  ?  ?Locomotion ?Ambulation ? ? ?Ambulation assist ? ? Ambulation activity did not occur: Safety/medical concerns ? ?Assist  level: 2 helpers ?Assistive device: Other (comment) (rail in hall) ?Max distance: 15  ? ?Walk 10 feet activity ? ? ?Assist ?   ? ?Assist level: 2 helpers ?Assistive device: Other (comment)  ? ?Walk 50 feet activity ? ? ?Assist Walk 50 feet with 2 turns activity did not occur: Safety/medical concerns ? ?  ?   ? ? ?Walk 150 feet activity ? ? ?Assist Walk 150 feet activity did not occur: Safety/medical concerns ? ?  ?  ?  ? ?Walk 10 feet on uneven surface  ?activity ? ? ?Assist Walk 10 feet on uneven surfaces activity did not occur: Safety/medical concerns ? ? ?  ?   ? ?Wheelchair ? ? ? ? ?Assist Is the patient using a wheelchair?: Yes ?Type of Wheelchair: Manual ?  ? ?Wheelchair assist level: Dependent - Patient 0% ?Max wheelchair distance: 150  ? ? ?Wheelchair 50 feet with 2 turns activity ? ? ? ?Assist ? ?  ?  ? ? ?Assist Level: Dependent - Patient 0%  ? ?Wheelchair 150 feet activity  ? ? ? ?Assist ?   ? ? ?Assist Level: Dependent - Patient 0%  ? ?Blood pressure 128/68, pulse 74, temperature 98.7 ?F (37.1 ?C), temperature source Oral, resp. rate 20, height 5\' 4"  (1.626 m), weight 68.8 kg, SpO2 96 %. ? ?Medical Problem List and Plan: ?1. Functional deficits secondary to right parietal MCA branch and PCA infarct secondary right carotid occlusion with concurrent moderate to severe intracranial multivessel atherosclerotic disease. ?            -patient may  shower ?            -ELOS/Goals: Supervision to min A 14-16 days ? ? -Continue CIR ?2.  Antithrombotics: ?-DVT/anticoagulation:  Pharmaceutical: Lovenox ?            -antiplatelet therapy: Aspirin 81 mg daily and Plavix 75 mg daily x3 months then aspirin alone ?3. Pain Management: Tylenol as needed, hip pain Left lateral contusion, local measure, analgesic (tylenol) prior to therapy  ?Jaw pain with negative exam points to mandibles, pain only with chewing , will check mandibular xray  ?4. Mood: Provide emotional support ?            -antipsychotic agents: N/A ?5.  Neuropsych: This patient is capable of making decisions on his own behalf. ?6. Skin/Wound Care: Routine skin checks ?7. Fluids/Electrolytes/Nutrition: Routine in and outs with follow-up chemistries ?8.  Hypertension.  Lisinopril 5 mg daily, Norvasc 5 mg daily.  Monitor with increased mobility ?Vitals:  ? 11/07/21 1953 11/08/21 0318  ?BP: (!) 142/84 128/68  ?Pulse: 79 74  ?Resp: 18 20  ?Temp: 98.1 ?F (36.7 ?C) 98.7 ?F (37.1 ?C)  ?SpO2: 98% 96%  ?Controlled 4/13 ? ?9.  Hyperlipidemia.  Lipitor ?10.  Chronic left eye blindness due to work related accident.  Follow-up outpatient ?11.  Obesity.  BMI 27.09.  Dietary follow-up ?12. Urinary hesitation-  not clear if new- might need PVRs/bladder scans to make sure not retaining. ? Mildly elevated WBC . UC reviewed and shows no growth.  ?13. Insomnia: start melatonin 3mg  HS ?14. Bradycardia: continue to monitor HR TID ? ?LOS: ?7 days ?A FACE TO FACE EVALUATION WAS PERFORMED ? ?Gianmarco Roye Genavie Boettger ?11/08/2021, 8:15 AM  ? ? ? ?

## 2021-11-08 NOTE — Progress Notes (Signed)
Occupational Therapy Session Note ? ?Patient Details  ?Name: Douglas Pruitt ?MRN: [id number] ?Date of Birth: 1951/04/21 ? ?Today's Date: 11/08/2021 ?OT Individual Time: 7289-7915 ?OT Individual Time Calculation (min): 58 min  ? ? ?Short Term Goals: ?Week 1:  OT Short Term Goal 1 (Week 1): Pt will complete sit to stand at sink with mod A. ?OT Short Term Goal 2 (Week 1): Pt will don shirt with min A. ?OT Short Term Goal 3 (Week 1): Pt will complete toilet transfer with max A and LRAD. ?OT Short Term Goal 4 (Week 1): Pt will therapeutic position LUE in chair/bed with no more than min VCs. ? ?Skilled Therapeutic Interventions/Progress Updates:  ?  Pt received semi-reclined in bed, live interpreter present throughout session, reports ongoing L thigh pain but somewhat improved, agreeable to therapy. Session focus on self-care retraining, activity tolerance, transfer retraining, LUE NMR in prep for improved ADL/IADL/func mobility performance + decreased caregiver burden. ? ?Came to sitting EOB min A to progress LLE off bed, squat-pivot to his R with min A to prevent L foot from sliding, fully pivot hips. Doffed shirt with S and cues to pull over head, donned new shirt with min A to thread LUE. Washed face with S seated at sink, declined further need for ADL. ? ?Total A w/c transport to and from mat. Massed practice of sit to stands with overall mod A to block L knee and to power up. No L saddle splints currently available for RW, transitioned to using back of chair on pt's R.  ? ?Completed 3x10 L lateral leans and forward shoulder flexion in gravity eliminated plane to facilitate LUE NMR/WB. ? ?RN notified of pt request for pain rx for L thigh pain and provided ice pack. ? ?Pt left seated in w/c with safety belt alarm engaged, live interpreter present, call bell in reach, and all immediate needs met.  ? ? ?Therapy Documentation ?Precautions:  ?Precautions ?Precautions: Fall ?Precaution Comments: L eye blind, L  hemiplegia ?Restrictions ?Weight Bearing Restrictions: No ? ?Pain: see session note ?ADL: See Care Tool for more details. ? ? ?Therapy/Group: Individual Therapy ? ?Volanda Napoleon MS, OTR/L ? ?11/08/2021, 6:52 AM ?

## 2021-11-08 NOTE — Plan of Care (Signed)
?  Problem: Consults ?Goal: RH STROKE PATIENT EDUCATION ?Description: See Patient Education module for education specifics  ?Outcome: Progressing ?  ?Problem: RH SKIN INTEGRITY ?Goal: RH STG MAINTAIN SKIN INTEGRITY WITH ASSISTANCE ?Description: STG Maintain Skin Integrity With [employer]. ?Outcome: Progressing ?  ?Problem: RH SAFETY ?Goal: RH STG ADHERE TO SAFETY PRECAUTIONS W/ASSISTANCE/DEVICE ?Description: STG Adhere to Safety Precautions With Cues and Reminder. ?Outcome: Progressing ?Goal: RH STG DECREASED RISK OF FALL WITH ASSISTANCE ?Description: STG Decreased Risk of Fall With [employer]. ?Outcome: Progressing ?  ?Problem: RH COGNITION-NURSING ?Goal: RH STG USES MEMORY AIDS/STRATEGIES W/ASSIST TO PROBLEM SOLVE ?Description: STG Uses Memory Aids/Strategies With Min Assistance to Problem Solve. ?Outcome: Progressing ?Goal: RH STG ANTICIPATES NEEDS/CALLS FOR ASSIST W/ASSIST/CUES ?Description: STG Anticipates Needs/Calls for Assist With Cues and Reminders. ?Outcome: Progressing ?  ?Problem: RH KNOWLEDGE DEFICIT ?Goal: RH STG INCREASE KNOWLEDGE OF HYPERTENSION ?Description: Patient will demonstrate knowledge of HTN medications, dietary recommendations, and blood pressure parameters with educational materials and handouts provided by staff independently at discharge. ? ?Outcome: Progressing ?Goal: RH STG INCREASE KNOWLEGDE OF HYPERLIPIDEMIA ?Description: Patient will demonstrate knowledge of HLD medications, dietary recommendations, and lab values with educational materials and handouts provided by staff independently at discharge. ? ?Outcome: Progressing ?Goal: RH STG INCREASE KNOWLEDGE OF STROKE PROPHYLAXIS ?Description: Patient will demonstrate knowledge of medications used to prevent future strokes with educational materials and handouts provided by staff independently at discharge. ? ?Outcome: Progressing ?  ?Problem: RH Vision ?Goal: RH LTG Vision (Specify) ?Outcome: Progressing ?  ?

## 2021-11-08 NOTE — Progress Notes (Addendum)
?  11/08/21 1300  ?Clinical Encounter Type  ?Visited With Health care provider;Patient ?([employer])  ?Visit Type Spiritual support ?(Emotional Support)  ?Referral From  ?(Brianne T. Garretson, CCP-SLP >> Cayley L. Glennon Mac, RN)  ?Consult/Referral To Chaplain ?Albertina Parr [place])  ?Spiritual Encounters  ?Spiritual Needs Emotional;Prayer  ? ?Met with Mr. Miken Zollars along with his interpreter of Bea Laura at patient's bedside. All conversation between Mr. Vanatta and I went through the interpreter.  ? ?Mr. Sivertsen shared that he felt God was punishing him. When asked to explain  his God, Mr. Simonin stated that he believed in [place]. Mr. Anselm Pancoast considers himself to be of the [place] tradition. Mr. Anselm Pancoast stated that he has been distant in his relationship with Christ but believed Him to present. At Mr. Ducharme request we shared intercessory pray for healing and renewed strength consistent with his faith. ? ?Chaplain Melvenia Beam, [place] Poot., [phone]. ?

## 2021-11-08 NOTE — Progress Notes (Signed)
Speech Language Pathology Daily Session Note ? ?Patient Details  ?Name: Douglas Pruitt ?MRN: [id number] ?Date of Birth: March 14, 1951 ? ?Today's Date: 11/08/2021 ?SLP Individual Time: 0800-0900 ?SLP Individual Time Calculation (min): 60 min ? ?Short Term Goals: ?Week 1: SLP Short Term Goal 1 (Week 1): Patient will consume current diet without overt s/s of aspiration with Min verbal cues for use of swallowing compensatory strategies. ?SLP Short Term Goal 2 (Week 1): Patient will utilize [place] increased vocal intensity at the sentence level to achieve 100% intelligibility with supervision level verbal cues. ?SLP Short Term Goal 3 (Week 1): Patient will scan to left field of enviornment during functional tasks with Mod verbal and visual cues. ?SLP Short Term Goal 4 (Week 1): Patient will demonstrate functional problem solving for functional and familiar tasks with Min verbal cues. ?SLP Short Term Goal 5 (Week 1): [name] will recall new, daily information with Mod verbal cues. ? ?Skilled Therapeutic Interventions: Skilled ST treatment focused on cognitive-communication goals. Pt was accompanied by interpretor. Pt complained of jaw discomfort starting yesterday evening. RN and MD aware. Pt also reported loneliness and having "dark thoughts" and requested to speak with a Chaplain. SLP notified RN regarding Chaplain, and SW regarding neuropsych referral. SLP facilitated session by providing overall mod A verbal cues for basic verbal reasoning task. Pt identified item that did not belong in field of 4 objects. Pt required mod A for visual scanning left of midline with improved carry over this date as compared to previous encounter. Interpretor reported mildly decreased speech intelligibility secondary to edentulous status in addition to slight "slurred" speech. Pt was able to increase vocal intensity and over-articulate speech with sup A verbal cues at the sentence and conversation level. Patient was left in bed with alarm activated and  immediate needs within reach at end of session. Continue per current plan of care.   ? ? ?Pain ?Pain Assessment ?Pain Scale: 0-10 ?Pain Score: 0-No pain ?Pain Type: Acute pain ?Pain Location: Leg ?Pain Orientation: Left ?Pain Descriptors / Indicators: Aching ?Pain Frequency: Intermittent ?Pain Onset: On-going ? ?Therapy/Group: Individual Therapy ? ?Douglas Pruitt Douglas Pruitt ?11/08/2021, 9:28 AM ?

## 2021-11-08 NOTE — Progress Notes (Signed)
?   11/08/21 1035  ?Clinical Encounter Type  ?Visited With Health care provider;Patient not available ?(Patient was sleeping; Spoke with Nurse Cayley L. Gayleen Orem, RN; Press photographer; NT)  ?Visit Type Initial  ?Referral From Nurse ?(Brianne TConsuello Closs, CCP-SLP >> Cayley L. Lillard Anes, RN)  ?Consult/Referral To Chaplain ?Gelene Mink [place])  ?Spiritual Encounters  ?Spiritual Needs Emotional ?(Loneliness and "Dark Thoughts")  ? ?Attempted Spiritual Care visit with Mr. Corrion Saal, who requested to speak with chaplain regarding loneliness and "dark Thought." Mr. Seiden was sleeping at this time. Spoke with Mr. Tremblay primary nurse Cayley L. Gayleen Orem who stated that referral was given to her by speech therapy.  ? ?Mr. Lollis's native language is [name] which is not listed on [employer] machine. No Interpretor present at this time, but scheduled to return at 1300. Will attempt visit later today. ?[address] [place], [place] [phone]. ?

## 2021-11-08 NOTE — Patient Care Conference (Signed)
Inpatient RehabilitationTeam Conference and Plan of Care Update ?Date: 11/07/2021   Time: 10:27 AM  ? ? ?Patient Name: Douglas Pruitt      ?Medical Record Number: [id number]  ?Date of Birth: 1950-08-18 ?Sex: Male         ?Room/Bed: 5C07C/5C07C-01 ?Payor Info: Payor: MEDICARE / Plan: MEDICARE PART A AND B / Product Type: *No Product type* /   ? ?Admit Date/Time:  11/01/2021  5:14 PM ? ?Primary Diagnosis:  Right middle cerebral artery stroke (HCC) ? ?Hospital Problems: Principal Problem: ?  Right middle cerebral artery stroke (HCC) ? ? ? ?Expected Discharge Date: Expected Discharge Date: 11/27/21 ? ?Team Members Present: ?Physician leading conference: Dr. Claudette Laws ?Social Worker Present: Lavera Guise, BSW ?Nurse Present: Chana Bode, RN ?PT Present: Grier Rocher, PT ?OT Present: Annye English, OT ?SLP Present: Eilene Ghazi, SLP ?PPS Coordinator present : Fae Pippin, SLP ? ?   Current Status/Progress Goal Weekly Team Focus  ?Bowel/Bladder ? ?   Continent        ?Swallow/Nutrition/ Hydration ? ? mod I for monitoring and clearing mild L buccal pocketing  CGA bathroom transfers, min LBD/toileting, S remaining ADL  Tolerance of current diet; likely baseline for swallowing with current textures   ?ADL's ? ? Total A LBD, max A LB bathing/toileting, min UBD/bathing seated at sink, S grooming seated at sink, min to mod squat-pivot to drop arm BSC; LUE/hand brummstrom level II, impulsive with mobility, L inattention  CGA bathroom transfers, min LBD/toileting, S remaining ADL  LUE NMR, transfer/self-care/balance retraining, activity tolerance, pt/family/DME/AE education   ?Mobility ? ? Impulsive; Bed mobility = Min/ModA; Transfers = ModA for sit<>stand and stand pivot, MinA for squat pivot and heavy cueing, Amb = 60ft using RHR in hallway with MaxA for LLE advancement and correction of hip ER and knee flexion in stance  Siting/ Standing balance with SUP, Bed mobility with SUP, Transfers with SUP to MinA, Ambulation  using LRAD with MinA  L hemibody NMR, standing balance, transfers, gait, stairs, education on impulsive behaviors, family education   ?Communication ? ? sup A; mildly reduced speech intelligibility per family report  mod I  speech strategies at the sentence and conversation level   ?Safety/Cognition/ Behavioral Observations ? mod A  Sup A  L visual scanning, basic problem solving, functional recall   ?Pain ? ?   N/A        ?Skin ? ?   Rash to back treated with sarna   Rash improving   Monitor skin q shift  ? ? ?Discharge Planning:  ?Patient lives with daughter, plans to return. 24/7 supervision avaliable   ?Team Discussion: ?Patient with impulsivity and left inattention, motor planning deficits with left upper extremity weakness and poor postural control post right MCA. Progression limited by left hip pain, contusion vs hematoma managed per MD; analgesics for pain ordered.  ?Patient on target to meet rehab goals: ?yes, currently needs total assist for lower body dressing and mod - max assist for toileting. Needs min - mod assist for squat pivots and mod assist for transfers with left lower extremity blocked. Able to ambulate up to 20' with max assist +2. Noted pocketing of D3 diet. Needs supervision for speech intelligibility and working on problem solving memory and left visual scanning. Goals for discharge set for CGA - supervision overall and min assist for gait.  ? ?*See Care Plan and progress notes for long and short-term goals.  ? ?Revisions to Treatment Plan:  ?N/A ?  ?Teaching  Needs: ?Safety, transfers, medications, dietary modifications, etc  ?Current Barriers to Discharge: ?Decreased caregiver support and Home enviroment access/layout ? ?Possible Resolutions to Barriers: ?Family education ?DME: RW ?  ? ? Medical Summary ?Current Status: Severe Hemiplegia, BP controlled ? Barriers to Discharge: Other (comments) ? Barriers to Discharge Comments: severe weakness, reduced safety ?Possible Resolutions to  [employer]: cont mobility training and NM re education ? ? ?Continued Need for Acute Rehabilitation Level of Care: The patient requires daily medical management by a physician with specialized training in physical medicine and rehabilitation for the following reasons: ?Direction of a multidisciplinary physical rehabilitation program to maximize functional independence : Yes ?Medical management of patient stability for increased activity during participation in [place] intensive rehabilitation regime.: Yes ?Analysis of laboratory values and/or radiology reports with any subsequent need for medication adjustment and/or medical intervention. : Yes ? ? ?I attest that I was present, lead the team conference, and concur with the assessment and plan of the team. ? ? ?Chana Bode B ?11/08/2021, 9:16 AM  ? ? ? ? ? ? ?

## 2021-11-09 DIAGNOSIS — I63511 Cerebral infarction due to unspecified occlusion or stenosis of right middle cerebral artery: Secondary | ICD-10-CM | POA: Diagnosis not present

## 2021-11-09 MED ORDER — MAGIC MOUTHWASH
10.0000 mL | Freq: Three times a day (TID) | ORAL | Status: AC
  Administered 2021-11-09 – 2021-11-16 (×20): 10 mL via ORAL
  Filled 2021-11-09 (×22): qty 10

## 2021-11-09 MED ORDER — TRAMADOL HCL 50 MG PO TABS
50.0000 mg | ORAL_TABLET | Freq: Four times a day (QID) | ORAL | Status: DC | PRN
  Administered 2021-11-09 – 2021-11-12 (×7): 50 mg via ORAL
  Filled 2021-11-09 (×8): qty 1

## 2021-11-09 MED ORDER — SORBITOL 70 % SOLN
30.0000 mL | Freq: Every day | Status: DC | PRN
  Administered 2021-11-09 – 2021-11-19 (×5): 30 mL via ORAL
  Filled 2021-11-09 (×5): qty 30

## 2021-11-09 MED ORDER — BENZOCAINE 10 % MT GEL
Freq: Three times a day (TID) | OROMUCOSAL | Status: DC | PRN
  Filled 2021-11-09: qty 9

## 2021-11-09 NOTE — Progress Notes (Signed)
Speech Language Pathology Weekly Progress and Session Note ? ?Patient Details  ?Name: Douglas Pruitt ?MRN: [id number] ?Date of Birth: 1951-04-10 ? ?Beginning of progress report period: November 02, 2021 ?End of progress report period: November 09, 2021 ? ?Today's Date: 11/09/2021 ?SLP Individual Time: 0904-1000 ?SLP Individual Time Calculation (min): 56 min ? ?Short Term Goals: ?Week 1: SLP Short Term Goal 1 (Week 1): Patient will consume current diet without overt s/s of aspiration with Min verbal cues for use of swallowing compensatory strategies. ?SLP Short Term Goal 1 - Progress (Week 1): Met ?SLP Short Term Goal 2 (Week 1): Patient will utilize Schyler increased vocal intensity at the sentence level to achieve 100% intelligibility with supervision level verbal cues. ?SLP Short Term Goal 2 - Progress (Week 1): Met ?SLP Short Term Goal 3 (Week 1): Patient will scan to left field of enviornment during functional tasks with Mod verbal and visual cues. ?SLP Short Term Goal 3 - Progress (Week 1): Met ?SLP Short Term Goal 4 (Week 1): Patient will demonstrate functional problem solving for functional and familiar tasks with Min verbal cues. ?SLP Short Term Goal 4 - Progress (Week 1): Progressing toward goal ?SLP Short Term Goal 5 (Week 1): [name] will recall new, daily information with Mod verbal cues. ?SLP Short Term Goal 5 - Progress (Week 1): Met ? ?New Short Term Goals: ?Week 2: SLP Short Term Goal 1 (Week 2): Patient will consume current diet without overt s/s of aspiration with sup A verbal cues for use of swallowing compensatory strategies. ?SLP Short Term Goal 2 (Week 2): Patient will utilize Sylvester increased vocal intensity at the sentence level to achieve 100% intelligibility at the mod I level ?SLP Short Term Goal 3 (Week 2): Patient will scan to left field of enviornment during functional tasks with min A verbal and visual cues. ?SLP Short Term Goal 4 (Week 2): Patient will demonstrate functional problem solving for functional and  familiar tasks with Min verbal cues. ?SLP Short Term Goal 5 (Week 2): [name] will recall new, daily information with min A verbal cues. ? ?Weekly Progress Updates: Patient has made functional gains and has met 4 of 5 STGs this reporting period. Patient is currently completing cognitive tasks with overall mod A verbal and visual cues to complete functional and mildly complex tasks accurately and safely. SLP continues to address problem solving, left visual scanning, functional recall, and intellectual awareness. Patient continues to exhibit mildly reduced vocal intensity and speech intelligibility at the sentence level with sup A verbal cues to implement speech strategies to achieve 100% intelligibility. Patient was tolerating a dysphagia 3 diet and thin liquids with min A verbal cues for awareness of mild L pocketing and anterior spillage. As of 4/13 pt began complaining of jaw discomfort and requested diet change to pureed diet and thin liquids until discomfort improves. SLP continuing to monitor tolerance in setting of discomfort and assess for possible diet advancement when appropriate. Patient and family education is ongoing. Patient would benefit from continued skilled SLP intervention to maximize cognitive functioning and overall functional independence prior to discharge.   ?  ?Intensity: Minumum of 1-2 x/day, 30 to 90 minutes ?Frequency: 3 to 5 out of 7 days ?Duration/Length of Stay: 5/2 ?Treatment/Interventions: Cognitive remediation/compensation;Dysphagia/aspiration precaution training;Internal/external aids;Speech/Language facilitation;Therapeutic Activities;Environmental controls;Cueing hierarchy;Functional tasks;Patient/family education ? ?Daily Session ?Skilled Therapeutic Interventions: Skilled ST treatment focused on cognitive and swallowing goals. Pt continues to report left lower jaw discomfort with mastication. Jaw x-ray was completed yesterday and results were unremarkable.  Pt requesting diet  change to pureed diet until discomfort improves. Nurse provided patient with pain medication prior to session. SLP completed orofacial assessment which appeared unremarkable. No evidence of swelling or irritation on jaw, lips, tongue, gums, palate. Pt agreeable to solid PO trials to further evaluate. Pt consumed soft/partially mashed banana consistent with dys 2 textures with incomplete mastication and eventually requested to orally expel bolus 2/2 discomfort. SLP recommends diet downgrade to dysphagia 1 textures and thin liquids. Will re-assess status at next visit. Informed nurse of diet change. SLP facilitated ordering patient's lunch and dinner meals with particular request for mashed potatoes and gravy with each meal. Pt expressed appreciation for support with this. SLP facilitated basic-mildly complex problem solving, error awareness, recall, and sustained attention with novel card task "Uno." Pt completed with overall mod A for reasoning, error awareness, and recall of task instructions. Pt exhibited more efficient processing as task progressed representative of good carry over of feedback and correction. Patient was left in wheelchair with alarm activated and immediate needs within reach at end of session. Continue per current plan of care.     ?General  ?  ?Pain ?Pain Assessment ?Pain Score: 2  ?Faces Pain Scale: Hurts a little bit ? ?Therapy/Group: Individual Therapy ? ?Oryon Gary T Othella Slappey ?11/09/2021, 3:35 PM ? ? ? ? ? ? ?

## 2021-11-09 NOTE — Progress Notes (Signed)
Patient speaks English as a send language. No family members in room today. Patient continues to put on the call light every 5 minutes. Staff continues to toilet, give water put on blankets, take off blankets, reposition. Patient educated that staff can not be in patients room at all times. However if he was feeling lonely patient can sit up in W/C and sit with staff at desk. Call light and all personal items within reach.  ?

## 2021-11-09 NOTE — Progress Notes (Signed)
Occupational Therapy Weekly Progress Note ? ?Patient Details  ?Name: Douglas Pruitt ?MRN: [id number] ?Date of Birth: 04/19/1951 ? ?Beginning of progress report period: November 02, 2021 ?End of progress report period: November 09, 2021 ? ?Today's Date: 11/09/2021 ?OT Individual Time: 4098-1191 ?OT Individual Time Calculation (min): 59 min  ? ? ?Patient has met 2 of 4 short term goals.  Pt has made slow progress this week towards LTG. Pt has demonstrated improved sit to stand performance, activity tolerance, ability to compensate for deficits to presently complete UBD and bathing at min A level, LBD and bathing at max A level. Pt can complete seated grooming tasks with distant S and set-up A. Pt is primarily using urinal with min A to prevent spillage. Pt cont to be primarily limited by dense LUE>LLE hemiplegia, L inattention, and cognitive deficits. LUE and hand currently assessed at Brummstrom level II and I respectively. Anticipate 24/7 S and CGA to min physical assist required upon DC. Daughter has not been present for OT sessions. ? ? ?Patient continues to demonstrate the following deficits: muscle weakness, decreased cardiorespiratoy endurance, impaired timing and sequencing, abnormal tone, unbalanced muscle activation, decreased coordination, and decreased motor planning, decreased visual perceptual skills, decreased attention to left, decreased awareness, decreased problem solving, decreased safety awareness, and decreased memory, and decreased standing balance, decreased postural control, hemiplegia, and decreased balance strategies and therefore will continue to benefit from skilled OT intervention to enhance overall performance with BADL and Reduce care partner burden. ? ?Patient progressing toward long term goals..  Continue plan of care. ? ?OT Short Term Goals ?Week 1:  OT Short Term Goal 1 (Week 1): Pt will complete sit to stand at sink with mod A. ?OT Short Term Goal 1 - Progress (Week 1): Met ?OT Short Term Goal 2  (Week 1): Pt will don shirt with min A. ?OT Short Term Goal 2 - Progress (Week 1): Met ?OT Short Term Goal 3 (Week 1): Pt will complete toilet transfer with max A and LRAD. ?OT Short Term Goal 3 - Progress (Week 1): Not met ?OT Short Term Goal 4 (Week 1): Pt will therapeutic position LUE in chair/bed with no more than min VCs. ?OT Short Term Goal 4 - Progress (Week 1): Not met ?Week 2:  OT Short Term Goal 1 (Week 2): Pt will complete toilet transfer with max A and LRAD. ?OT Short Term Goal 2 (Week 2): Pt will therapeutic position LUE in chair/bed with no more than min VCs. ?OT Short Term Goal 3 (Week 2): Pt will don shirt with S. ?OT Short Term Goal 4 (Week 2): Pt will don pants with mod A. ? ?Skilled Therapeutic Interventions/Progress Updates:  ?  Pt received semi-reclined in bed, reports ongoing jaw and L thigh pain, per nurse pt already pre medication but pt unaware, agreeable to therapy. Session focus on self-care retraining, activity tolerance, LUE NMR in prep for improved ADL/IADL/func mobility performance + decreased caregiver burden. ? ?Came to sitting EOB with min A to progress LLE and to lift trunk due to pain. Squat-pivot to his R with min A to block LLE and to his L with light mod A to lift/fully pivot hips. Declined need for ADL/changing into his own clothes. Strong urine smell ntoed, total A to change out underwear/pants although no noted incontinence. Total A to don shoes. Pt prefers wearing scrub clothes, reports his own clothes are too restrictive to move in. ? ?Pt completed 2 sit to stands with min to light mod  A with HW. C/o increase in L thigh pain so activity ceased. ? ?1:1 NMES applied to L digit flexors/extensors in prep for functional return of LUE, total A to facilitate functional grasp/release on soft sponge. Attempted to have pt return demonstration, but despite max multimodal cuing pt unable to efficiently return / pt attempting to squeeze therapist's hand or use his R hand to squeeze  sponge without involving affected hand. ? ?Ratio 1:3 ?Rate 35 pps ?Waveform- Asymmetric ?Ramp 1.0 ?Pulse 300 ?Intensity- 20 clicks ?Duration -    5 min, 5 min ?No adverse reactions after treatment and is skin intact. Denies pain. ? ?Live interpreter present halfway through session. MD in/out morning assessment. ? ?Pt left seated in w/c awaiting follow SLP session with interpreter present, call bell in reach, and all immediate needs met.  ? ? ?Therapy Documentation ?Precautions:  ?Precautions ?Precautions: Fall ?Precaution Comments: L eye blind, L hemiplegia ?Restrictions ?Weight Bearing Restrictions: No ? ?Pain: ongoing jaw/L thigh pain, premedicated per RN ?ADL: See Care Tool for more details. ? ?Therapy/Group: Individual Therapy ? ?Claudie Revering MS, OTR/L ? ?11/09/2021, 6:56 AM  ?

## 2021-11-09 NOTE — Progress Notes (Signed)
?                                                       PROGRESS NOTE ? ?Interpreter in room ?Subjective/Complaints: ? ?Reviewed ortho pantogram , cont to have lower mouth pain ? ?ROS- neg CP, SOB, N/V/D,  ? ?Objective: ?  ?DG Orthopantogram ? ?Result Date: 11/08/2021 ?CLINICAL DATA:  Acute bilateral jaw pain. EXAM: ORTHOPANTOGRAM/PANORAMIC COMPARISON:  None. FINDINGS: No definite fracture or dislocation is noted involving the mandible. The patient is missing all their teeth. IMPRESSION: No definite mandibular abnormality is noted. Electronically Signed   By: Marijo Conception M.D.   On: 11/08/2021 11:20   ?No results for input(s): WBC, HGB, HCT, PLT in the last 72 hours. ? ?Recent Labs  ?  11/08/21 ?[id number]  ?CREATININE 0.72  ? ? ? ?Intake/Output Summary (Last 24 hours) at 11/09/2021 0832 ?Last data filed at 11/08/2021 1901 ?Gross per 24 hour  ?Intake 240 ml  ?Output 350 ml  ?Net -110 ml  ? ?  ? ?  ? ?Physical Exam: ?Vital Signs ?Blood pressure 108/62, pulse 81, temperature 98.6 ?F (37 ?C), resp. rate 18, height 5\' 4"  (1.626 m), weight 66.1 kg, SpO2 97 %. ? ? ?HEENT- oral cavity moist , no bleeding, pt completely edentulous, no evidence of gingivitis  ?No mandibular tenderness, no pain over masseter or sub mandibular area, neg TMJ pain ?No pain with opening and closing of mouth no restriction in ROM  ?Toungue with white discoloration but no buccal plaques ?General: No acute distress ?Mood and affect are appropriate ?Heart: Regular rate and rhythm no rubs murmurs or extra sounds ?Lungs: Clear to auscultation, breathing unlabored, no rales or wheezes ?Abdomen: Positive bowel sounds, soft nontender to palpation, nondistended ?Extremities: No clubbing, cyanosis, or edema ?Skin: No evidence of breakdown, no evidence of rash ? ? ?Neurologic: Cranial nerves II through XII intact, motor strength is 5/5 in RIght  and 0/5 Left deltoid, bicep, tricep, grip,5/5 RIght and 2-/5 Left  hip flexor, knee extensors, ankle dorsiflexor and  plantar flexor ?Sensory exam normal sensation to light touch and proprioception in bilateral upper and lower extremities ?Cerebellar exam normal finger to nose to finger as well as heel to shin in bilateral upper and lower extremities ?Musculoskeletal: Full range of motion in all 4 extremities. No joint swelling, no pain with L Hip ROM, mild tenderness lateral thigh with moderate palpation  ? ? ?Assessment/Plan: ?1. Functional deficits which require 3+ hours per day of interdisciplinary therapy in a comprehensive inpatient rehab setting. ?Physiatrist is providing close team supervision and 24 hour management of active medical problems listed below. ?Physiatrist and rehab team continue to assess barriers to discharge/monitor patient progress toward functional and medical goals ? ?Care Tool: ? ?Bathing ? Bathing activity did not occur: Safety/medical concerns ?Body parts bathed by patient: Chest, Abdomen, Front perineal area, Right upper leg, Left upper leg, Face  ? Body parts bathed by helper: Right arm, Left arm, Left lower leg, Right lower leg, Buttocks ?  ?  ?Bathing assist Assist Level: Moderate Assistance - Patient 50 - 74% ?  ?  ?Upper Body Dressing/Undressing ?Upper body dressing Upper body dressing/undressing activity did not occur (including orthotics): Safety/medical concerns ?What is the patient wearing?: Pull over shirt ?   ?Upper body assist Assist Level: Moderate Assistance -  Patient 50 - 74% ?   ?Lower Body Dressing/Undressing ?Lower body dressing ? ? ? Lower body dressing activity did not occur: Safety/medical concerns ?What is the patient wearing?: Incontinence brief, Pants ? ?  ? ?Lower body assist Assist for lower body dressing: Moderate Assistance - Patient 50 - 74% ?   ? ?Toileting ?Toileting Toileting Activity did not occur (Probation officer and hygiene only): N/A (no void or bm)  ?Toileting assist Assist for toileting: Minimal Assistance - Patient > 75% (urinal) ?  ?  ?Transfers ?Chair/bed  transfer ? ?Transfers assist ? Chair/bed transfer activity did not occur: Safety/medical concerns ? ?Chair/bed transfer assist level: Moderate Assistance - Patient 50 - 74% ?  ?  ?Locomotion ?Ambulation ? ? ?Ambulation assist ? ? Ambulation activity did not occur: Safety/medical concerns ? ?Assist level: 2 helpers ?Assistive device: Other (comment) (rail in hall) ?Max distance: 15  ? ?Walk 10 feet activity ? ? ?Assist ?   ? ?Assist level: 2 helpers ?Assistive device: Other (comment)  ? ?Walk 50 feet activity ? ? ?Assist Walk 50 feet with 2 turns activity did not occur: Safety/medical concerns ? ?  ?   ? ? ?Walk 150 feet activity ? ? ?Assist Walk 150 feet activity did not occur: Safety/medical concerns ? ?  ?  ?  ? ?Walk 10 feet on uneven surface  ?activity ? ? ?Assist Walk 10 feet on uneven surfaces activity did not occur: Safety/medical concerns ? ? ?  ?   ? ?Wheelchair ? ? ? ? ?Assist Is the patient using a wheelchair?: Yes ?Type of Wheelchair: Manual ?  ? ?Wheelchair assist level: Dependent - Patient 0% ?Max wheelchair distance: 150  ? ? ?Wheelchair 50 feet with 2 turns activity ? ? ? ?Assist ? ?  ?  ? ? ?Assist Level: Dependent - Patient 0%  ? ?Wheelchair 150 feet activity  ? ? ? ?Assist ?   ? ? ?Assist Level: Dependent - Patient 0%  ? ?Blood pressure 108/62, pulse 81, temperature 98.6 ?F (37 ?C), resp. rate 18, height 5\' 4"  (1.626 m), weight 66.1 kg, SpO2 97 %. ? ?Medical Problem List and Plan: ?1. Functional deficits secondary to right parietal MCA branch and PCA infarct secondary right carotid occlusion with concurrent moderate to severe intracranial multivessel atherosclerotic disease. ?            -patient may  shower ?            -ELOS/Goals: Supervision to min A 11/27/2021 ? ? -Continue CIR PT, OT  ?2.  Antithrombotics: ?-DVT/anticoagulation:  Pharmaceutical: Lovenox ?            -antiplatelet therapy: Aspirin 81 mg daily and Plavix 75 mg daily x3 months then aspirin alone ?3. Pain Management: Tylenol as  needed, hip pain Left lateral contusion, local measure, analgesic (tylenol) prior to therapy  ?Jaw pain with negative exam points to mandibles, pain only with chewing , will check mandibular xray  ?4. Mood: Provide emotional support ?            -antipsychotic agents: N/A ?5. Neuropsych: This patient is capable of making decisions on his own behalf. ?6. Skin/Wound Care: Routine skin checks ?7. Fluids/Electrolytes/Nutrition: Routine in and outs with follow-up chemistries ?8.  Hypertension.  Lisinopril 5 mg daily, Norvasc 5 mg daily.  Monitor with increased mobility ?Vitals:  ? 11/08/21 1927 11/09/21 0315  ?BP: 133/66 108/62  ?Pulse: 77 81  ?Resp: 16 18  ?Temp: 97.8 ?F (36.6 ?C) 98.6 ?F (  37 ?C)  ?SpO2: 98% 97%  ?Controlled 4/14 ? ?9.  Hyperlipidemia.  Lipitor ?10.  Chronic left eye blindness due to work related accident.  Follow-up outpatient ?11.  Obesity.  BMI 27.09.  Dietary follow-up ?12. Urinary hesitation-PVRs/bladder scans to make sure not retaining. ? Mildly elevated WBC . UC reviewed and shows no growth.  ?13. Insomnia: start melatonin 3mg  HS ?14. Bradycardia: continue to monitor HR TID ? ?LOS: ?8 days ?A FACE TO FACE EVALUATION WAS PERFORMED ? ?Douglas Pruitt ?11/09/2021, 8:32 AM  ? ? ? ?

## 2021-11-09 NOTE — Progress Notes (Signed)
Physical Therapy Weekly Progress Note ? ?Patient Details  ?Name: Douglas Pruitt ?MRN: [id number] ?Date of Birth: Apr 15, 1951 ? ?Beginning of progress report period: November 02, 2021 ?End of progress report period: November 09, 2021 ? ?Today's Date: 11/09/2021 ?PT Individual Time: 1303-1400 ?PT Individual Time Calculation (min): 57 min  and Today's Date: 11/09/2021 ?PT Missed Time: 18 Minutes ?Missed Time Reason: Pain ? ?Patient has met 1 of 4 short term goals.  Pt making very slow progress towards goals and may need add'l time to meet LTG due to barriers from new pain complaints in mouth and L ant sup thigh preventing participation in complete and/ or higher level movement focused therapy sessions. He continues to require Mod assist to complete bed mobility, squat pivot transfers with Min/ ModA and sit<>stand transfers with ModA then requiring MaxA for standing balance. He completes short distance ambulation ~12 ft using hallway handrail and MaxA for balance, sequencing, slowing movements, safety. Has not yet progressed to stair training. Today was second session he self-limited participation 2/2 pain, and is limited by low awareness of deficits, decreased attention to task, impulsivity to move as well as L sided weakness.  Barriers include language deficit as pt speaks and comprehends very limited [place]. No family education has occurred outside of family providing assist with interpreting during sessions.   ? ?Patient continues to demonstrate the following deficits muscle weakness, decreased cardiorespiratoy endurance, impaired timing and sequencing, unbalanced muscle activation, decreased coordination, and decreased motor planning, decreased visual acuity, decreased midline orientation, decreased attention to left, and decreased motor planning, decreased awareness, decreased problem solving, and decreased safety awareness, and decreased sitting balance, decreased standing balance, decreased balance strategies, and hemipareisis   and therefore will continue to benefit from skilled PT intervention to increase functional independence with mobility. ? ?Patient progressing toward long term goals..  Continue plan of care. ? ?PT Short Term Goals ?Week 1:  PT Short Term Goal 1 (Week 1): Pt will transfer to and from [facility] with min assist ?PT Short Term Goal 1 - Progress (Week 1): Progressing toward goal ?PT Short Term Goal 2 (Week 1): Pt will ambulate 68ft with max assist of 1. ?PT Short Term Goal 2 - Progress (Week 1): Progressing toward goal ?PT Short Term Goal 3 (Week 1): Pt will ascend 4 steps wth max assist with 1 UE support ?PT Short Term Goal 3 - Progress (Week 1): Progressing toward goal ?PT Short Term Goal 4 (Week 1): Pt will perform bed mobility with mod assit consistently ?PT Short Term Goal 4 - Progress (Week 1): Met ?Week 2:  PT Short Term Goal 1 (Week 2): Pt will transfer to and from [facility] with min assist ?PT Short Term Goal 2 (Week 2): Pt will ambulate 63ft with max assist of 1. ?PT Short Term Goal 3 (Week 2): Pt will ascend 4 steps wth max assist with 1 UE support ?PT Short Term Goal 4 (Week 2): Pt will perform bed mobility with consistent MinA. ? ?Skilled Therapeutic Interventions/Progress Updates:  ?Patient supine in bed on entrance to room. Patient alert and agreeable to PT session. In person interpreter present throughout session.  ? ?Patient c/o anterior superior thigh pain and describes pain at stabbing, burning, and discomforting. He relates that the pain is constant but fluctuates in intensity with WB and movement.  ? ?Therapeutic Activity: ?Bed Mobility: Patient performed push toward [facility] using RLE and RUE with MinA from therapist. VC/ tc required for positioning. ? ?After disc with pt through interpreter re:  pt's pain, message sent to MD re: thigh pain that is separate from hip pain and now interfering with pt's ability to participate in higher level therapy sessions d/t increased symptoms with WB and activation of muscle group.   ? ?Manual Therapy: ?Bed positioning with lowered HOB and pt relating improving symptoms in anterior thigh with hip being placed in more extension positioning. Pt provided with STM and PNF contract/ relax in sdielying as well as agonist activation into extension for antagonist relaxation. Repeated 2x10 reps each. Range of hip in 90/90 position with pt demonstrating increased pain in IR. Oscillations into hip ER/ light IR. Then traction/ distraction provided at L knee and hip with pt relating "feels good". Holds for 30sec - x 5 each joint.  ? ?Patient supine  in bed at end of session with brakes locked, bed alarm set, and all needs within reach. Provided with ice pack to anterior hip on request. ? ? ?Therapy Documentation ?Precautions:  ?Precautions ?Precautions: Fall ?Precaution Comments: L eye blind, L hemiplegia ?Restrictions ?Weight Bearing Restrictions: No ?General: ?PT Amount of Missed Time (min): 18 Minutes ?PT Missed Treatment Reason: Pain ?Vital Signs: ?  ?Pain: ?Pain Assessment ?Pain Scale: Faces ?Faces Pain Scale: hurts even more ?Pain Type: Acute pain; Neuropathic pain ?Pain Location: Leg ?Pain orientation: Left; Anterior, Upper ?Pain Descriptors/ Indicators: Sharp; Stabbing; Burning; Discomfort ?Pain Onset: On-going ?Patients States Pain Goal: 0 ?Pain Intervention(s): Repositioned; Therapeutic touch; Tractions; RN made aware; MD notified; Comment (L hip and knee provided with manual distraction at joints for mild pain relief) ? ?Therapy/Group: Individual Therapy ? ?Loel Dubonnet PT, DPT ?11/09/2021, 2:14 PM  ?

## 2021-11-10 NOTE — Progress Notes (Signed)
?                                                       PROGRESS NOTE ? ? ?Subjective/Complaints: ?Orthopantogram reviewed, patient continues to have lower mouth pain but is well controlled with as needed medications that have been ordered.  Patient seen today with no other complaints other than mouth pain. ? ?ROS: Negatives CP, SOB, N/V/D ? ? ?Objective: ?  ?DG Orthopantogram ? ?Result Date: 11/08/2021 ?CLINICAL DATA:  Acute bilateral jaw pain. EXAM: ORTHOPANTOGRAM/PANORAMIC COMPARISON:  None. FINDINGS: No definite fracture or dislocation is noted involving the mandible. The patient is missing all their teeth. IMPRESSION: No definite mandibular abnormality is noted. Electronically Signed   By: Lupita Raider M.D.   On: 11/08/2021 11:20   ?No results for input(s): WBC, HGB, HCT, PLT in the last 72 hours. ?Recent Labs  ?  11/08/21 ?5929  ?CREATININE 0.72  ? ? ?Intake/Output Summary (Last 24 hours) at 11/10/2021 0642 ?Last data filed at 11/10/2021 0031 ?Gross per 24 hour  ?Intake 120 ml  ?Output 925 ml  ?Net -805 ml  ?  ? ?  ? ?Physical Exam: ? ?Constitutional: No distress . Vital signs reviewed. ?HENT: Oral cavity is moist without bleeding no evidence of gingivitis no not edentulous.  There is no mandibular tenderness tenderness.  There is no pain over the masseter or submandibular area.  Patient is negative for TMJ pain.  Patient is able to open and close mouth without pain and without restrictions in range of motion.  Tongue has some white discoloration but there were no blue buccal plaques present. ?Eyes: EOMI. No discharge. ?Cardiovascular: RRR. No JVD. ?Respiratory: CTA Bilaterally. Normal effort. ?GI: BS +. Non-distended. ?Abdomen: Nontender to palpation and nondistended. ?Musc: Full range of motion for all 4 extremities.  There is no joint swelling and patient is able to move left hip full range of motion without pain, there is mild tenderness of lateral thigh with a moderate degree of palpation ?Extremities:  No clubbing, cyanosis, or edema ?Psych: Mood appropriate ? ?Vital Signs ?Blood pressure (!) 135/59, pulse 77, temperature 98.2 ?F (36.8 ?C), temperature source Oral, resp. rate 18, height 5\' 4"  (1.626 m), weight 64 kg, SpO2 98 %. ? ? ? ?Assessment/Plan: ?1. Functional deficits which require 3+ hours per day of interdisciplinary therapy in a comprehensive inpatient rehab setting. ?Physiatrist is providing close team supervision and 24 hour management of active medical problems listed below. ?Physiatrist and rehab team continue to assess barriers to discharge/monitor patient progress toward functional and medical goals ? ?Care Tool: ? ?Bathing ? Bathing activity did not occur: Safety/medical concerns ?Body parts bathed by patient: Chest, Abdomen, Front perineal area, Right upper leg, Left upper leg, Face  ? Body parts bathed by helper: Right arm, Left arm, Left lower leg, Right lower leg, Buttocks ?  ?  ?Bathing assist Assist Level: Moderate Assistance - Patient 50 - 74% ?  ?  ?Upper Body Dressing/Undressing ?Upper body dressing Upper body dressing/undressing activity did not occur (including orthotics): Safety/medical concerns ?What is the patient wearing?: Pull over shirt ?   ?Upper body assist Assist Level: Moderate Assistance - Patient 50 - 74% ?   ?Lower Body Dressing/Undressing ?Lower body dressing ? ? ? Lower body dressing activity did not occur: Safety/medical concerns ?What is the  patient wearing?: Incontinence brief, Pants ? ?  ? ?Lower body assist Assist for lower body dressing: Moderate Assistance - Patient 50 - 74% ?   ? ?Toileting ?Toileting Toileting Activity did not occur (AcupuncturistClothing management and hygiene only): N/A (no void or bm)  ?Toileting assist Assist for toileting: Minimal Assistance - Patient > 75% (urinal) ?  ?  ?Transfers ?Chair/bed transfer ? ?Transfers assist ? Chair/bed transfer activity did not occur: Safety/medical concerns ? ?Chair/bed transfer assist level: Moderate Assistance - Patient  50 - 74% ?  ?  ?Locomotion ?Ambulation ? ? ?Ambulation assist ? ? Ambulation activity did not occur: Safety/medical concerns ? ?Assist level: 2 helpers ?Assistive device: Other (comment) (rail in hall) ?Max distance: 15  ? ?Walk 10 feet activity ? ? ?Assist ?   ? ?Assist level: 2 helpers ?Assistive device: Other (comment)  ? ?Walk 50 feet activity ? ? ?Assist Walk 50 feet with 2 turns activity did not occur: Safety/medical concerns ? ?  ?   ? ? ?Walk 150 feet activity ? ? ?Assist Walk 150 feet activity did not occur: Safety/medical concerns ? ?  ?  ?  ? ?Walk 10 feet on uneven surface  ?activity ? ? ?Assist Walk 10 feet on uneven surfaces activity did not occur: Safety/medical concerns ? ? ?  ?   ? ?Wheelchair ? ? ? ? ?Assist Is the patient using a wheelchair?: Yes ?Type of Wheelchair: Manual ?  ? ?Wheelchair assist level: Dependent - Patient 0% ?Max wheelchair distance: 150  ? ? ?Wheelchair 50 feet with 2 turns activity ? ? ? ?Assist ? ?  ?  ? ? ?Assist Level: Dependent - Patient 0%  ? ?Wheelchair 150 feet activity  ? ? ? ?Assist ?   ? ? ?Assist Level: Dependent - Patient 0%  ? ?Blood pressure (!) 135/59, pulse 77, temperature 98.2 ?F (36.8 ?C), temperature source Oral, resp. rate 18, height 5\' 4"  (1.626 m), weight 64 kg, SpO2 98 %. ? ?Medical Problem List and Plan: ?1. 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         - Patient may shower ?         - ELOS/Goals: Supervision to min A 11/27/2021 ?- Continue CIR PT, LT ? ?2.  Antithrombotics: ?-DVT/anticoagulation: Pharmaceutical: Lovenox ?            -antiplatelet therapy: Aspirin 81 mg daily and Plavix 71 mg daily x3 months then aspirin alone. ?3. Pain Management: Tylenol as needed, hip pain left lateral contusion, local measures, analgesics (Tylenol prior to therapy.  Jaw pain with negative exam points to  mandibles, only with chewing, examine mandibular x-ray ?4. Mood: Provide emotional support ?- Antipsychotic agents: N/A ?5. Neuropsych: This patient is capable of making decisions on his own behalf. ?6. Skin/Wound Care: Routine skin checks ?7. Fluids/Electrolytes/Nutrition: Routine in and outs with follow-up chemistries ?8.  Hypertension.  4/15 continue lisinopril 5 mg daily, 5 mg Norvasc daily.  Monitor with increased mobility. ?9.  Hyperlipidemia continue Lipitor tablet 40 mg at bedtime daily. ?10.  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  Follow-up as outpatient. ?11.  Obesity.  BMI 27.09.  Dietary follow-up ?12.  Urinary hesitation-POVRS/bladder scans to make sure patient's not retaining.  Had mildly elevated BCs urine culture reviewed showed no growth WBCs now are within normal limits. ?13.  Insomnia: Continue melatonin 3 mg  HS ?14.  Bradycardia: Can continue to monitor heart rate 3 times daily. ? ?  ?LOS: ?9 days ?A FACE TO FACE EVALUATION WAS PERFORMED ? ?Tressia Miners, FNP ?11/10/2021, 6:42 AM  ? ? ? ?

## 2021-11-10 NOTE — Progress Notes (Signed)
Physical Therapy Session Note ? ?Patient Details  ?Name: Douglas Pruitt ?MRN: [id number] ?Date of Birth: 13-Aug-1950 ? ?Today's Date: 11/10/2021 ?PT Individual Time: 1102-1200 ?PT Individual Time Calculation (min): 58 min  ? ?Short Term Goals: ?Week 1:  PT Short Term Goal 1 (Week 1): Pt will transfer to and from [facility] with min assist ?PT Short Term Goal 1 - Progress (Week 1): Progressing toward goal ?PT Short Term Goal 2 (Week 1): Pt will ambulate 90ft with max assist of 1. ?PT Short Term Goal 2 - Progress (Week 1): Progressing toward goal ?PT Short Term Goal 3 (Week 1): Pt will ascend 4 steps wth max assist with 1 UE support ?PT Short Term Goal 3 - Progress (Week 1): Progressing toward goal ?PT Short Term Goal 4 (Week 1): Pt will perform bed mobility with mod assit consistently ?PT Short Term Goal 4 - Progress (Week 1): Met ?Week 2:  PT Short Term Goal 1 (Week 2): Pt will transfer to and from [facility] with min assist ?PT Short Term Goal 2 (Week 2): Pt will ambulate 84ft with max assist of 1. ?PT Short Term Goal 3 (Week 2): Pt will ascend 4 steps wth max assist with 1 UE support ?PT Short Term Goal 4 (Week 2): Pt will perform bed mobility with consistent MinA. ?Week 3:    ? ?Skilled Therapeutic Interventions/Progress Updates:  ? ?Pt received supine in bed and agreeable to PT. Supine>sit transfer with max assist and cues for log roll technique with poor carryover on the L side of the bed, as pt trying to scoot to edge in supine prior to sitting up.  ? ?Sit<>stand from EOB after several attempts due to poor understanding of instruction with mod assist to pull pants to waist. UE supported on HW. ? ?Squat pivot transfer to [facility] with mod assist and LLE blocked and cues for improved lift off bed prior to initiating transfer ? ?Sit<>stand at rail in hall x 5 with mod assist into standing then, pregait stpping at rail in hall 3 x 5 BLE with max assist from PT to stabilize trunk and prevenet pusher response and facilitate knee extension on the  LLE to advance RLE  ? ?Gait training at rail in hall x 28ft with max assist and cues for gait pattern, LLE step length, activation of hip/knee extension on the LLE and decreased speed of movement. Pt  noted to have improved motor planning on this day ? ?Seated NMR for LUE horizontal adduction/abduction and elbod flexion/extension with gravity eleminitated on table 2 x 1 min each..  ? ?Pt returned to room and performed squat pivot transfer to bed with mod assist and LLE blocked. Sit>supine completed with min assist at the LLE, and left supine in bed with call bell in reach and all needs met.  ? ?   ? ?Therapy Documentation ?Precautions:  ?Precautions ?Precautions: Fall ?Precaution Comments: L eye blind, L hemiplegia ?Restrictions ?Weight Bearing Restrictions: No ? ?Vital Signs: ?Therapy Vitals ?Temp: 98.9 ?F (37.2 ?C) ?Pulse Rate: 89 ?Resp: 18 ?BP: 114/71 ?Patient Position (if appropriate): Lying ?Oxygen Therapy ?SpO2: 100 % ?O2 Device: Room Air ?Pain: ?Pain Assessment ?Pain Score: 3  ?Pain Location: Mouth ?Pain Orientation: Left ?Pain Descriptors / Indicators: Aching;Tender;Throbbing ?Pain Onset: On-going ?Patients Stated Pain Goal: 2 ?Pain Intervention(s): Medication (See eMAR) ?Multiple Pain Sites: Yes ?2nd Pain Site ?Pain Location: Hip ?Pain Orientation: Left ?Pain Descriptors / Indicators: Aching;Tender;Discomfort;Constant;Pressure ?Pain Onset: On-going ?Patient's Stated Pain Goal: 2 ?Pain Intervention(s): Medication (See eMAR);Massage;Relaxation;Cold applied;Rest;Repositioned ? ? ?  Therapy/Group: Individual Therapy ? ?Golden Pop ?11/10/2021, 3:31 PM  ?

## 2021-11-11 NOTE — Progress Notes (Signed)
?                                                       PROGRESS NOTE ? ? ?Subjective/Complaints: ?Orthopantogram reviewed. ? ?Lower mouth pain present but is well controlled with as needed medications that have been ordered.  Patient also has anterior left thigh pain which is controlled with prn tramadol. ? ?ROS: Negatives CP, SOB, N/V/D ? ? ?Objective: ?  ?No results found. ?No results for input(s): WBC, HGB, HCT, PLT in the last 72 hours. ?No results for input(s): NA, K, CL, CO2, GLUCOSE, BUN, CREATININE, CALCIUM in the last 72 hours. ? ? ?Intake/Output Summary (Last 24 hours) at 11/11/2021 1226 ?Last data filed at 11/11/2021 [id number] ?Gross per 24 hour  ?Intake 240 ml  ?Output 600 ml  ?Net -360 ml  ?  ? ?  ? ?Physical Exam: ? ?Constitutional: No distress . Vital signs reviewed. ?HENT: Oral cavity is moist without bleeding no evidence of gingivitis not edentulous.  There is no mandibular tenderness tenderness.  There is no pain over the masseter or submandibular area.  Patient is negative for TMJ pain.  Patient is able to open and close mouth without pain and without restrictions in range of motion.  Tongue has some white discoloration but there were no blue buccal plaques present. No teeth present.  ?Eyes: EOMI. No discharge. ?Cardiovascular: RRR. No JVD. ?Respiratory: CTA Bilaterally. Normal effort. ?GI: BS +. Non-distended. ?Abdomen: Nontender to palpation and nondistended. ?Musc: Full range of motion for all 4 extremities.  There is no joint swelling and patient is able to move left hip full range of motion without pain, there is mild tenderness of lateral thigh with a moderate degree of palpation ?Extremities: No clubbing, cyanosis, or edema ?Psych: Mood appropriate ? ?Vital Signs ?Blood pressure (!) 118/57, pulse 76, temperature 98.2 ?F (36.8 ?C), temperature source Oral, resp. rate 18, height 5\' 4"  (1.626 m), weight 64.2 kg, SpO2 99 %. ? ? ? ?Assessment/Plan: ?1. Functional deficits which require 3+ hours per  day of interdisciplinary therapy in a comprehensive inpatient rehab setting. ?Physiatrist is providing close team supervision and 24 hour management of active medical problems listed below. ?Physiatrist and rehab team continue to assess barriers to discharge/monitor patient progress toward functional and medical goals ? ?Care Tool: ? ?Bathing ? Bathing activity did not occur: Safety/medical concerns ?Body parts bathed by patient: Chest, Abdomen, Front perineal area, Right upper leg, Left upper leg, Face  ? Body parts bathed by helper: Right arm, Left arm, Left lower leg, Right lower leg, Buttocks ?  ?  ?Bathing assist Assist Level: Moderate Assistance - Patient 50 - 74% ?  ?  ?Upper Body Dressing/Undressing ?Upper body dressing Upper body dressing/undressing activity did not occur (including orthotics): Safety/medical concerns ?What is the patient wearing?: Pull over shirt ?   ?Upper body assist Assist Level: Moderate Assistance - Patient 50 - 74% ?   ?Lower Body Dressing/Undressing ?Lower body dressing ? ? ? Lower body dressing activity did not occur: Safety/medical concerns ?What is the patient wearing?: Incontinence brief, Pants ? ?  ? ?Lower body assist Assist for lower body dressing: Moderate Assistance - Patient 50 - 74% ?   ? ?Toileting ?Toileting Toileting Activity did not occur (AcupuncturistClothing management and hygiene only): N/A (no void or bm)  ?Toileting assist  Assist for toileting: Minimal Assistance - Patient > 75% (urinal) ?  ?  ?Transfers ?Chair/bed transfer ? ?Transfers assist ? Chair/bed transfer activity did not occur: Safety/medical concerns ? ?Chair/bed transfer assist level: Moderate Assistance - Patient 50 - 74% ?  ?  ?Locomotion ?Ambulation ? ? ?Ambulation assist ? ? Ambulation activity did not occur: Safety/medical concerns ? ?Assist level: 2 helpers ?Assistive device: Other (comment) (rail in hall) ?Max distance: 15  ? ?Walk 10 feet activity ? ? ?Assist ?   ? ?Assist level: 2 helpers ?Assistive  device: Other (comment)  ? ?Walk 50 feet activity ? ? ?Assist Walk 50 feet with 2 turns activity did not occur: Safety/medical concerns ? ?  ?   ? ? ?Walk 150 feet activity ? ? ?Assist Walk 150 feet activity did not occur: Safety/medical concerns ? ?  ?  ?  ? ?Walk 10 feet on uneven surface  ?activity ? ? ?Assist Walk 10 feet on uneven surfaces activity did not occur: Safety/medical concerns ? ? ?  ?   ? ?Wheelchair ? ? ? ? ?Assist Is the patient using a wheelchair?: Yes ?Type of Wheelchair: Manual ?  ? ?Wheelchair assist level: Dependent - Patient 0% ?Max wheelchair distance: 150  ? ? ?Wheelchair 50 feet with 2 turns activity ? ? ? ?Assist ? ?  ?  ? ? ?Assist Level: Dependent - Patient 0%  ? ?Wheelchair 150 feet activity  ? ? ? ?Assist ?   ? ? ?Assist Level: Dependent - Patient 0%  ? ?Blood pressure (!) 118/57, pulse 76, temperature 98.2 ?F (36.8 ?C), temperature source Oral, resp. rate 18, height 5\' 4"  (1.626 m), weight 64.2 kg, SpO2 99 %. ? ?Medical Problem List and Plan: ?1. 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         - Patient may shower ?         - ELOS/Goals: Supervision to min A 11/27/2021 ?- Continue CIR PT, LT ?2.  Antithrombotics: ?-DVT/anticoagulation: Pharmaceutical: Lovenox ?            -antiplatelet therapy: Aspirin 81 mg daily and Plavix 75 mg daily x3 months then aspirin alone. ?-4/16 Continue 81 mg ASA and 75 mg Plavix ?3. Pain Management: Tylenol as needed, hip pain left lateral contusion, local measures, analgesics (Tylenol prior to therapy.  Jaw pain with negative exam points to mandibles, only with chewing, examine mandibular x-ray ?-4/16 Continue tylenol 650 mg qhr along with tramadol 50 mg q6hr for pain ?4. Mood: Provide emotional support ?- Antipsychotic agents: N/A ?5. Neuropsych: This patient is capable of making decisions on his  own behalf. ?6. Skin/Wound Care: Routine skin checks ?7. Fluids/Electrolytes/Nutrition: Routine in and outs with follow-up chemistries ?8.  Hypertension.   ?-4/15 continue lisinopril 5 mg daily, 5 mg Norvasc daily.   ?-4/16 continue lisinopril 5 mg daily, 5 mg Norvasc daily.  Monitor with increased mobility. ?9.  Hyperlipidemia continue Lipitor tablet 40 mg at bedtime daily. ?10.  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  Follow-up as outpatient. ?11.  Obesity.  BMI 27.09.  Dietary follow-up ?12.  Urinary hesitation-POVRS/bladder scans to make sure patient's not retaining.  Had mildly elevated BCs urine culture reviewed showed no growth WBCs now are within normal limits. ?13.  Insomnia:  ?-4/16 slept okay last night, continue melatonin 3 mg HS ?14.  Bradycardia: Can continue to  monitor heart rate 3 times daily. ?-4/16 HR today continues to be in upper 50's, asymptomatic ? ?  ?LOS: ?10 days ?A FACE TO FACE EVALUATION WAS PERFORMED ? ?Tressia Miners, FNP ?11/11/2021, 12:26 PM  ? ? ? ?

## 2021-11-12 LAB — CBC WITH DIFFERENTIAL/PLATELET
Abs Immature Granulocytes: 0.05 10*3/uL (ref 0.00–0.07)
Basophils Absolute: 0.1 10*3/uL (ref 0.0–0.1)
Basophils Relative: 0 %
Eosinophils Absolute: 0.1 10*3/uL (ref 0.0–0.5)
Eosinophils Relative: 0 %
HCT: 28.8 % — ABNORMAL LOW (ref 39.0–52.0)
Hemoglobin: 10.2 g/dL — ABNORMAL LOW (ref 13.0–17.0)
Immature Granulocytes: 0 %
Lymphocytes Relative: 26 %
Lymphs Abs: 3 10*3/uL (ref 0.7–4.0)
MCH: 29.6 pg (ref 26.0–34.0)
MCHC: 35.4 g/dL (ref 30.0–36.0)
MCV: 83.5 fL (ref 80.0–100.0)
Monocytes Absolute: 1 10*3/uL (ref 0.1–1.0)
Monocytes Relative: 8 %
Neutro Abs: 7.6 10*3/uL (ref 1.7–7.7)
Neutrophils Relative %: 66 %
Platelets: 210 10*3/uL (ref 150–400)
RBC: 3.45 MIL/uL — ABNORMAL LOW (ref 4.22–5.81)
RDW: 12.6 % (ref 11.5–15.5)
WBC: 11.7 10*3/uL — ABNORMAL HIGH (ref 4.0–10.5)
nRBC: 0 % (ref 0.0–0.2)

## 2021-11-12 MED ORDER — TRAMADOL HCL 50 MG PO TABS
50.0000 mg | ORAL_TABLET | Freq: Four times a day (QID) | ORAL | Status: DC
  Administered 2021-11-12 – 2021-11-18 (×18): 50 mg via ORAL
  Filled 2021-11-12 (×19): qty 1

## 2021-11-12 NOTE — Progress Notes (Signed)
Patients family member does not want patient to have scheduled tramadol. Prefers pt to have prn tylenol, but is okay with patient having tramadol if in a lot of pain.  ?

## 2021-11-12 NOTE — Progress Notes (Signed)
Physical Therapy Session Note ? ?Patient Details  ?Name: Douglas Pruitt ?MRN: [id number] ?Date of Birth: 1951/07/25 ? ?Today's Date: 11/12/2021 ?PT Individual Time: 6203-5597 ?PT Individual Time Calculation (min): 41 min  ? ?Short Term Goals: ?Week 1:  PT Short Term Goal 1 (Week 1): Pt will transfer to and from [facility] with min assist ?PT Short Term Goal 1 - Progress (Week 1): Progressing toward goal ?PT Short Term Goal 2 (Week 1): Pt will ambulate 2f with max assist of 1. ?PT Short Term Goal 2 - Progress (Week 1): Progressing toward goal ?PT Short Term Goal 3 (Week 1): Pt will ascend 4 steps wth max assist with 1 UE support ?PT Short Term Goal 3 - Progress (Week 1): Progressing toward goal ?PT Short Term Goal 4 (Week 1): Pt will perform bed mobility with mod assit consistently ?PT Short Term Goal 4 - Progress (Week 1): Met ?Week 2:  PT Short Term Goal 1 (Week 2): [facility] ?PT Short Term Goal 2 (Week 2): Pt will ambulate 38fwith max assist of 1. ?PT Short Term Goal 3 (Week 2): Pt will ascend 4 steps wth max assist with 1 UE support ?PT Short Term Goal 4 (Week 2): Pt will perform bed mobility with consistent MinA. ? ?Skilled Therapeutic Interventions/Progress Updates:  ?Patient asleep in bed on entrance to room. K pad removed at start of session and replaced at end of session with ice pack to L anterior thigh.  ? ?No interpreter present. ? ?Patient difficult to rouse once awake requests something to drink. Attempt to sit pt on EOB requiring Max A to reach EOB and ModA for balance. Pt not in position and falling back asleep. Attempts to wake pt, with brief periods of adequate alertness level, however, pt continues to doze off. Return to supine with MaxA. NT present to assist with Max A for pad placement under pt to adjust positioning.  ModA +2 to move pt toward [facility] CampusAttempt to raise [facility]OC[facility] Healthor improved positioning with little to arouse pt. No drink provided d/t pt being asleep with each  transition in positioning. Continued attempts to wake, with pt unable to maintain eyes open and quickly falling asleep.  ? ?Patient with one grab to thigh during mobility and demonstrating pain at LLE.  ? ?Ice pack placed for 3041mthen returned to remove. Request to place heat again to L anterior thigh in 74m47mPositioned pillows for improved neutral L hip positioning and LUE support. ? ?Patient supine  in bed at end of session with brakes locked, bed alarm set, and all needs within reach. ? ?Pt missed 34 min of skilled therapy due to lethargy/ pain. Will re-attempt as schedule and pt availability permits.  ? ?Therapy Documentation ?Precautions:  ?Precautions ?Precautions: Fall ?Precaution Comments: L eye blind, L hemiplegia ?Restrictions ?Weight Bearing Restrictions: No ?General: ?PT Amount of Missed Time (min): 34 Minutes ?PT Missed Treatment Reason: Patient fatigue;Pain ?Vital Signs: ?Therapy Vitals ?Temp: 98.5 ?F (36.9 ?C) ?Pulse Rate: 77 ?Resp: 17 ?BP: (!) 106/56 ?Patient Position (if appropriate): Lying ?Oxygen Therapy ?SpO2: 97 % ?O2 Device: Room Air ?Pain: ? Only one brief instance of indication of pain to anterior superior L thigh.  ? ?Therapy/Group: Individual Therapy ? ?JuliAlger Simons DPT ?11/12/2021, 1:46 PM  ?

## 2021-11-12 NOTE — Progress Notes (Signed)
Speech Language Pathology Daily Session Note ? ?Patient Details  ?Name: Douglas Pruitt ?MRN: [id number] ?Date of Birth: 01/05/1951 ? ?Today's Date: 11/12/2021 ?SLP Individual Time: 0900-1000 ?SLP Individual Time Calculation (min): 60 min ? ?Short Term Goals: ?Week 2: SLP Short Term Goal 1 (Week 2): Patient will consume current diet without overt s/s of aspiration with sup A verbal cues for use of swallowing compensatory strategies. ?SLP Short Term Goal 2 (Week 2): Patient will utilize [place] increased vocal intensity at the sentence level to achieve 100% intelligibility at the mod I level ?SLP Short Term Goal 3 (Week 2): Patient will scan to left field of enviornment during functional tasks with min A verbal and visual cues. ?SLP Short Term Goal 4 (Week 2): Patient will demonstrate functional problem solving for functional and familiar tasks with Min verbal cues. ?SLP Short Term Goal 5 (Week 2): [name] will recall new, daily information with min A verbal cues. ? ?Skilled Therapeutic Interventions:Skilled ST services focused on cognitive skills. Interpretor present. Pt expressed pain in left leg, rating 5 out 10. Pain medication was given recently by nurse and pt was agreeable to heat pad, SLP applied. SLP facilitated left scanning and basic problem solving in PEG design task, pt required max A verbal/visual cues to place PEG left of midline and mod A verbal cues for problem solving right of midline. Pt expressed fatigue and requested to transfer to bed, SLP transferred via stedy. Pt missed 5 minutes of treatment time due to fatigue. Pt was left in room with call bell within reach and chair alarm set. SLP recommends to continue skilled services. ?   ? ?Pain ?Pain Assessment ?Pain Score: 5  ?Pain Type: Acute pain ?Pain Location: Leg ?Pain Orientation: Left ?Pain Descriptors / Indicators: Discomfort ?Pain Onset: On-going ?Pain Intervention(s): Heat applied;Rest;Relaxation ? ?Therapy/Group: Individual Therapy ? ?Nayah Lukens ?11/12/2021, 1:59 PM ?

## 2021-11-12 NOTE — Progress Notes (Signed)
Occupational Therapy Session Note ? ?Patient Details  ?Name: Douglas Pruitt ?MRN: [id number] ?Date of Birth: 05-Aug-1950 ? ?Today's Date: 11/12/2021 ?OT Individual Time: 9476-5465 ?OT Individual Time Calculation (min): 55 min  ? ? ?Short Term Goals: ?Week 2:  OT Short Term Goal 1 (Week 2): Pt will complete toilet transfer with max A and LRAD. ?OT Short Term Goal 2 (Week 2): Pt will therapeutic position LUE in chair/bed with no more than min VCs. ?OT Short Term Goal 3 (Week 2): Pt will don shirt with S. ?OT Short Term Goal 4 (Week 2): Pt will don pants with mod A. ? ?Skilled Therapeutic Interventions/Progress Updates:  ?  Patient received in bed with HOB elevated, eating breakfast.  Patient with poor alignment of head and neck for eating.  Spilling on gown.  Patient assisted to roll to sidelying to sit at edge of bed.  Patient with poor postural control at edge of bed - needed mod facilitation to balance on level bed initially - then able to maintain brief episodes of static control.   ?Transferred to wheelchiar then to sink for bathing and dressing.  Patient reporting pain in left thigh.  Reports significant pain with movement - reduced pain when leg is still.  Patient with decreased attention to left environment and body - patient with longstanding left visual loss - but now not attentive to left arm, left chest.  Patient needs cueing and facilitation for all forward weight shifts - patient maintains strong posterior rightward preference in sitting.  Worked to align him from base of support in wheelchair to provide a more midline experience.  Sit to stand with alignment of left leg, and facilitation of forward weight shift - with sink as forward target with mod assist.   ?Interpreter present throughout session.   ?Patient left up in wheelchair with alarm belt in place and engaged.  Call bell in his lap, arm rest in place.   ? ?Therapy Documentation ?Precautions:  ?Precautions ?Precautions: Fall ?Precaution Comments: L eye  blind, L hemiplegia ?Restrictions ?Weight Bearing Restrictions: No ? ?  ?Pain: ?Pain Assessment ?Pain Scale: 8/10 with movement, 4-5/10 at rest ?Pain Type: Acute pain ?Pain Location: Thigh ?Pain Orientation: Left ?Pain Onset: On-going ?Patients Stated Pain Goal: 2 ?Pain Intervention(s): Medication (See eMAR);Relaxation;Rest ? ? ? ?Therapy/Group: Individual Therapy ? ?[name] ?11/12/2021, 11:47 AM ?

## 2021-11-12 NOTE — Progress Notes (Signed)
?                                                       PROGRESS NOTE ? ? ?Subjective/Complaints: ? ?Oral pain improved with topical analgesic ? ?ROS: Negatives CP, SOB, N/V/D ? ? ?Objective: ?  ?No results found. ?Recent Labs  ?  11/12/21 ?0601  ?WBC 11.7*  ?HGB 10.2*  ?HCT 28.8*  ?PLT 210  ? ?No results for input(s): NA, K, CL, CO2, GLUCOSE, BUN, CREATININE, CALCIUM in the last 72 hours. ? ? ?Intake/Output Summary (Last 24 hours) at 11/12/2021 1134 ?Last data filed at 11/12/2021 0245 ?Gross per 24 hour  ?Intake --  ?Output 900 ml  ?Net -900 ml  ? ?  ? ?  ? ?Physical Exam: ? ?Constitutional: No distress . Vital signs reviewed. ?HENT: Oral cavity is moist without bleeding no evidence of gingivitis not edentulous.  There is no mandibular tenderness tenderness.  There is no pain over the masseter or submandibular area.  Patient is negative for TMJ pain.  Patient is able to open and close mouth without pain and without restrictions in range of motion.  Tongue has some white discoloration but there were no blue buccal plaques present. No teeth present.  ?Eyes: EOMI. No discharge. ?Cardiovascular: RRR. No JVD. ?Respiratory: CTA Bilaterally. Normal effort. ?GI: BS +. Non-distended. ?Abdomen: Nontender to palpation and nondistended. ?Musc: Full range of motion for all 4 extremities.  There is no joint swelling and patient is able to move left hip full range of motion without pain, there is mild tenderness of lateral thigh with a moderate degree of palpation ?Extremities: No clubbing, cyanosis, or edema ?Psych: Mood appropriate ? ?Vital Signs ?Blood pressure 130/67, pulse 73, temperature 97.9 ?F (36.6 ?C), temperature source Oral, resp. rate 18, height 5\' 4"  (1.626 m), weight 65.1 kg, SpO2 100 %. ? ? ? ?Assessment/Plan: ?1. Functional deficits which require 3+ hours per day of interdisciplinary therapy in a comprehensive inpatient rehab setting. ?Physiatrist is providing close team supervision and 24 hour management of  active medical problems listed below. ?Physiatrist and rehab team continue to assess barriers to discharge/monitor patient progress toward functional and medical goals ? ?Care Tool: ? ?Bathing ? Bathing activity did not occur: Safety/medical concerns ?Body parts bathed by patient: Chest, Abdomen, Front perineal area, Right upper leg, Left upper leg, Face  ? Body parts bathed by helper: Right arm, Left arm, Left lower leg, Right lower leg, Buttocks ?  ?  ?Bathing assist Assist Level: Moderate Assistance - Patient 50 - 74% ?  ?  ?Upper Body Dressing/Undressing ?Upper body dressing Upper body dressing/undressing activity did not occur (including orthotics): Safety/medical concerns ?What is the patient wearing?: Pull over shirt ?   ?Upper body assist Assist Level: Moderate Assistance - Patient 50 - 74% ?   ?Lower Body Dressing/Undressing ?Lower body dressing ? ? ? Lower body dressing activity did not occur: Safety/medical concerns ?What is the patient wearing?: Incontinence brief, Pants ? ?  ? ?Lower body assist Assist for lower body dressing: Moderate Assistance - Patient 50 - 74% ?   ? ?Toileting ?Toileting Toileting Activity did not occur (Probation officer and hygiene only): N/A (no void or bm)  ?Toileting assist Assist for toileting: Minimal Assistance - Patient > 75% (urinal) ?  ?  ?Transfers ?Chair/bed transfer ? ?Transfers  assist ? Chair/bed transfer activity did not occur: Safety/medical concerns ? ?Chair/bed transfer assist level: Moderate Assistance - Patient 50 - 74% ?  ?  ?Locomotion ?Ambulation ? ? ?Ambulation assist ? ? Ambulation activity did not occur: Safety/medical concerns ? ?Assist level: 2 helpers ?Assistive device: Other (comment) (rail in hall) ?Max distance: 15  ? ?Walk 10 feet activity ? ? ?Assist ?   ? ?Assist level: 2 helpers ?Assistive device: Other (comment)  ? ?Walk 50 feet activity ? ? ?Assist Walk 50 feet with 2 turns activity did not occur: Safety/medical concerns ? ?  ?   ? ? ?Walk  150 feet activity ? ? ?Assist Walk 150 feet activity did not occur: Safety/medical concerns ? ?  ?  ?  ? ?Walk 10 feet on uneven surface  ?activity ? ? ?Assist Walk 10 feet on uneven surfaces activity did not occur: Safety/medical concerns ? ? ?  ?   ? ?Wheelchair ? ? ? ? ?Assist Is the patient using a wheelchair?: Yes ?Type of Wheelchair: Manual ?  ? ?Wheelchair assist level: Dependent - Patient 0% ?Max wheelchair distance: 150  ? ? ?Wheelchair 50 feet with 2 turns activity ? ? ? ?Assist ? ?  ?  ? ? ?Assist Level: Dependent - Patient 0%  ? ?Wheelchair 150 feet activity  ? ? ? ?Assist ?   ? ? ?Assist Level: Dependent - Patient 0%  ? ?Blood pressure 130/67, pulse 73, temperature 97.9 ?F (36.6 ?C), temperature source Oral, resp. rate 18, height 5\' 4"  (1.626 m), weight 65.1 kg, SpO2 100 %. ? ?Medical Problem List and Plan: ?1. 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         - Patient may shower ?         - ELOS/Goals: Supervision to min A 11/27/2021 ?- Continue CIR PT, LT ?2.  Antithrombotics: ?-DVT/anticoagulation: Pharmaceutical: Lovenox ?            -antiplatelet therapy: Aspirin 81 mg daily and Plavix 75 mg daily x3 months then aspirin alone. ?-4/16 Continue 81 mg ASA and 75 mg Plavix ?3. Pain Management: Tylenol as needed, hip pain left lateral contusion, local measures, analgesics (Tylenol prior to therapy.  Jaw pain with negative exam points to mandibles, only with chewing, examine mandibular x-ray ?-4/16 Continue tylenol 650 mg qhr, will schedule tramadol 50 mg q6hr for pain ?4. Mood: Provide emotional support ?- Antipsychotic agents: N/A ?5. Neuropsych: This patient is capable of making decisions on his own behalf. ?6. Skin/Wound Care: Routine skin checks ?7. Fluids/Electrolytes/Nutrition: Routine in and outs with follow-up chemistries ?8.  Hypertension.    ?-4/17 controlled  continue lisinopril 5 mg daily, 5 mg Norvasc daily.   ?Vitals:  ? 11/11/21 1939 11/12/21 0357  ?BP: (!) 128/58 130/67  ?Pulse: 72 73  ?Resp: 18 18  ?Temp: 98.8 ?F (37.1 ?C) 97.9 ?F (36.6 ?C)  ?SpO2: 98% 100%  ?  ?9.  Hyperlipidemia continue Lipitor tablet 40 mg at bedtime daily. ?10.  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  Follow-up as outpatient. ?11.  Obesity.  BMI 27.09.  Dietary follow-up ?12.  Urinary hesitation-POVRS/bladder scans to make sure patient's not retaining.  Had mildly elevated BCs urine culture reviewed showed no growth WBCs now are within normal limits. ?13.  Insomnia:  ?-4/16 slept okay last night, continue melatonin 3 mg HS ?14.  Bradycardia: Can continue to monitor heart rate 3 times daily. ?-4/16 HR today continues to be in upper 50's, asymptomatic ? ?  ?LOS: ?11 days ?A FACE TO FACE EVALUATION WAS PERFORMED ? ?Charlett Blake, MD ?11/12/2021, 11:34 AM  ? ? ? ?

## 2021-11-13 ENCOUNTER — Other Ambulatory Visit: Payer: Self-pay | Admitting: *Deleted

## 2021-11-13 MED ORDER — GABAPENTIN 100 MG PO CAPS
100.0000 mg | ORAL_CAPSULE | Freq: Two times a day (BID) | ORAL | Status: DC
  Administered 2021-11-13 – 2021-11-16 (×6): 100 mg via ORAL
  Filled 2021-11-13 (×6): qty 1

## 2021-11-13 NOTE — Progress Notes (Signed)
Patient up eating lunch. Continues to C/O left hip pain and right mouth pain/ Tramadol given as ordered. Ice applied to right thigh. Appitite fair, continue to encourage fluids.  ?

## 2021-11-13 NOTE — Progress Notes (Addendum)
Speech Language Pathology Daily Session Note ? ?Patient Details  ?Name: Douglas Pruitt ?MRN: [id number] ?Date of Birth: March 10, 1951 ? ?Today's Date: 11/13/2021 ?SLP Individual Time: 0900-1000 ?SLP Individual Time Calculation (min): 60 min ? ?Short Term Goals: ?Week 2: SLP Short Term Goal 1 (Week 2): Patient will consume current diet without overt s/s of aspiration with sup A verbal cues for use of swallowing compensatory strategies. ?SLP Short Term Goal 2 (Week 2): Patient will utilize Douglas Pruitt increased vocal intensity at the sentence level to achieve 100% intelligibility at the mod I level ?SLP Short Term Goal 3 (Week 2): Patient will scan to left field of enviornment during functional tasks with min A verbal and visual cues. ?SLP Short Term Goal 4 (Week 2): Patient will demonstrate functional problem solving for functional and familiar tasks with Min verbal cues. ?SLP Short Term Goal 5 (Week 2): [name] will recall new, daily information with min A verbal cues. ? ?Skilled Therapeutic Interventions: Skilled ST treatment focused on cognitive goals. Upon arrival pt sitting in wheelchair and complained of hip/upper thigh discomfort. Ice pack applied per pt request. SLP inquired about patient's jaw/oral discomfort. Pt reports minimal improvement and requested to remain on Dys 1 diet at this time, stated "mashed food only." Will continue with current diet and revisit consideration of diet change as indicated. SLP facilitated oral care using suction sponge with mod A for thoroughness. White coating visible on lingual surface and evident sore on left lower buccal cavity. Pt is currently prescribed magic mouthwash and orajel. Notified nurse and PA regarding possible consideration of stronger oral rinse considering the presence of white coating. SLP facilitated session by providing min A verbal cues for problem solving and organization of common household items in various rooms throughout the home. Patient required min-to-mod A verbal cues  for left visual scanning using small pictures. Patient requested transfer to bed at end of session; utilize stedy and sup A verbal cues for safety. Patient was left in bed with alarm activated and immediate needs within reach at end of session. Continue per current plan of care.   ?   ?Pain ?Pain Assessment ?Pain Scale: 0-10 ?Pain Score: 4  ?Faces Pain Scale: Hurts a little bit ?Pain Type: Acute pain ?Pain Location: Hip ?Pain Orientation: Left ?Pain Descriptors / Indicators: Aching;Tender;Discomfort ?Pain Onset: On-going ?Patients Stated Pain Goal: 2 ?Pain Intervention(s): Medication (See eMAR);Cold applied;Pain med given for lower pain score than stated, per patient request;Rest ?Multiple Pain Sites: Yes ?2nd Pain Site ?Pain Score: 2 ?Faces Pain Scale: Hurts a little bit ?Pain Type: Acute pain ?Pain Location: Mouth ?Pain Orientation: Right ?Pain Descriptors / Indicators: Aching ?Pain Frequency: Several days a week ?Pain Onset: On-going ?Patient's Stated Pain Goal: 2 ?Pain Intervention(s): Medication (See eMAR) ? ?Therapy/Group: Individual Therapy ? ?Douglas Pruitt ?11/13/2021, 12:34 PM ?

## 2021-11-13 NOTE — Progress Notes (Signed)
Occupational Therapy Session Note ? ?Patient Details  ?Name: Douglas Pruitt ?MRN: [id number] ?Date of Birth: 1950-09-01 ? ?Today's Date: 11/13/2021 ?OT Individual Time: [phone] ?OT Individual Time Calculation (min): 54 min  ? ? ?Short Term Goals: ?Week 2:  OT Short Term Goal 1 (Week 2): Pt will complete toilet transfer with max A and LRAD. ?OT Short Term Goal 2 (Week 2): Pt will therapeutic position LUE in chair/bed with no more than min VCs. ?OT Short Term Goal 3 (Week 2): Pt will don shirt with S. ?OT Short Term Goal 4 (Week 2): Pt will don pants with mod A. ? ?Skilled Therapeutic Interventions/Progress Updates:  ?Pt greeted supine in bed reporting a HA and increased pain in LLE, pt declined OOB mobility or bed level ADLs but reports need to void bladder. Set - up assist for urinal from semi reclined posiiton in bed.  Asked RN to step in to administer pain meds. Pain meds administered and pt now agreeable to OOB mobility, MOD A for sit>supine needing assist to fully maneuver BLEs to EOB and elevate trunk into sitting. Pt continues to present with L inattention needing max cues to attend to LUE/LLE during all mobility tasks. Pt needed MAX A to don pants via sit>stand with RW. Pt stood with heavy MOD A while OTA pulled pants up to waist line. Pt completed squat pivot from EOB>w/c to R side with MAX A. Pt did wash face and hands with set- up assist from w/c. Pt worked on below [employer] therex to increase AROM for ADL participation.               ? ?Horizontal shoudler ABD/ADD ( both hands positioned on slide board laid across pts lap with wash cloth underneath bilateral hands to decrease friction) with hand over hand and without, pt needed MAX multimodal cues to complete therex with correct body mechanics as pt initially compensating by over using trunk. ?Probation officer with and without active assist.  ? ?Pt left seated in w/c with alarm belt activated and all needs within reach.  ?Interpreter present during  session.  ? ? ?Therapy Documentation ?Precautions:  ?Precautions ?Precautions: Fall ?Precaution Comments: L eye blind, L hemiplegia ?Restrictions ?Weight Bearing Restrictions: No ? ? ? ?Therapy/Group: Individual Therapy ? ?Barron Schmid ?11/13/2021, 9:05 AM ?

## 2021-11-13 NOTE — Progress Notes (Signed)
?                                                       PROGRESS NOTE ? ? ?Subjective/Complaints: ? ?Family does not wish pt to have scheduled  tramadol (this was requested by nursing and therapy ).  Continues have left hip pain.  It is described as throbbing and burning.  He did have a fall with extensive bruising on the left hip but negative x-rays. ? ?ROS: Negatives CP, SOB, N/V/D ? ? ?Objective: ?  ?No results found. ?Recent Labs  ?  11/12/21 ?0601  ?WBC 11.7*  ?HGB 10.2*  ?HCT 28.8*  ?PLT 210  ? ? ?No results for input(s): NA, K, CL, CO2, GLUCOSE, BUN, CREATININE, CALCIUM in the last 72 hours. ? ? ?Intake/Output Summary (Last 24 hours) at 11/13/2021 0832 ?Last data filed at 11/13/2021 0816 ?Gross per 24 hour  ?Intake 240 ml  ?Output 650 ml  ?Net -410 ml  ? ?  ? ?  ? ?Physical Exam: ? ?Constitutional: No distress . Vital signs reviewed. ?ENT edentulous  Eyes: EOMI. No discharge. ?Cardiovascular: RRR. No JVD. ?Respiratory: CTA Bilaterally. Normal effort. ?GI: BS +. Non-distended. ?Abdomen: Nontender to palpation and nondistended. ?Musc: Full range of motion for all 4 extremities.  There is no joint swelling and patient is able to move left hip full range of motion without pain, there is mild tenderness of lateral thigh with a moderate degree of palpation ?Extremities: No clubbing, cyanosis, or edema ?Psych: Mood appropriate ? ?Vital Signs ?Blood pressure 116/63, pulse 65, temperature 98 ?F (36.7 ?C), resp. rate 14, height 5\' 4"  (1.626 m), weight 65.1 kg, SpO2 100 %. ? ? ? ?Assessment/Plan: ?1. Functional deficits which require 3+ hours per day of interdisciplinary therapy in a comprehensive inpatient rehab setting. ?Physiatrist is providing close team supervision and 24 hour management of active medical problems listed below. ?Physiatrist and rehab team continue to assess barriers to discharge/monitor patient progress toward functional and medical goals ? ?Care Tool: ? ?Bathing ? Bathing activity did not occur:  Safety/medical concerns ?Body parts bathed by patient: Left arm, Chest, Abdomen, Front perineal area, Right upper leg, Left upper leg, Face, Right lower leg  ? Body parts bathed by helper: Left lower leg, Buttocks ?  ?  ?Bathing assist Assist Level: Moderate Assistance - Patient 50 - 74% ?  ?  ?Upper Body Dressing/Undressing ?Upper body dressing Upper body dressing/undressing activity did not occur (including orthotics): Safety/medical concerns ?What is the patient wearing?: Pull over shirt ?   ?Upper body assist Assist Level: Moderate Assistance - Patient 50 - 74% (decreased attention to left side) ?   ?Lower Body Dressing/Undressing ?Lower body dressing ? ? ? Lower body dressing activity did not occur: Safety/medical concerns ?What is the patient wearing?: Incontinence brief, Pants ? ?  ? ?Lower body assist Assist for lower body dressing: Moderate Assistance - Patient 50 - 74% ?   ? ?Toileting ?Toileting Toileting Activity did not occur ( and hygiene only): N/A (no void or bm)  ?Toileting assist Assist for toileting: Minimal Assistance - Patient > 75% (urinal) ?  ?  ?Transfers ?Chair/bed transfer ? ?Transfers assist ? Chair/bed transfer activity did not occur: Safety/medical concerns ? ?Chair/bed transfer assist level: Moderate Assistance - Patient 50 - 74% ?  ?  ?Locomotion ?  Ambulation ? ? ?Ambulation assist ? ? Ambulation activity did not occur: Safety/medical concerns ? ?Assist level: 2 helpers ?Assistive device: Other (comment) (rail in hall) ?Max distance: 15  ? ?Walk 10 feet activity ? ? ?Assist ?   ? ?Assist level: 2 helpers ?Assistive device: Other (comment)  ? ?Walk 50 feet activity ? ? ?Assist Walk 50 feet with 2 turns activity did not occur: Safety/medical concerns ? ?  ?   ? ? ?Walk 150 feet activity ? ? ?Assist Walk 150 feet activity did not occur: Safety/medical concerns ? ?  ?  ?  ? ?Walk 10 feet on uneven surface  ?activity ? ? ?Assist Walk 10 feet on uneven surfaces activity did  not occur: Safety/medical concerns ? ? ?  ?   ? ?Wheelchair ? ? ? ? ?Assist Is the patient using a wheelchair?: Yes ?Type of Wheelchair: Manual ?  ? ?Wheelchair assist level: Dependent - Patient 0% ?Max wheelchair distance: 150  ? ? ?Wheelchair 50 feet with 2 turns activity ? ? ? ?Assist ? ?  ?  ? ? ?Assist Level: Dependent - Patient 0%  ? ?Wheelchair 150 feet activity  ? ? ? ?Assist ?   ? ? ?Assist Level: Dependent - Patient 0%  ? ?Blood pressure 116/63, pulse 65, temperature 98 ?F (36.7 ?C), resp. rate 14, height 5\' 4"  (1.626 m), weight 65.1 kg, SpO2 100 %. ? ?Medical Problem List and Plan: ?1. 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         - Patient may shower ?         - ELOS/Goals: Supervision to min A 11/27/2021, team conf in am  ?- Continue CIR PT, LT ?2.  Antithrombotics: ?-DVT/anticoagulation: Pharmaceutical: Lovenox ?            -antiplatelet therapy: Aspirin 81 mg daily and Plavix 75 mg daily x3 months then aspirin alone. ?-4/16 Continue 81 mg ASA and 75 mg Plavix ?3. Pain Management: Tylenol as needed, hip pain left lateral contusion, local measures, analgesics  ?Oral pain continue benzocaine gel, oral pantogram negative ?-4/16 Continue tylenol 650 mg qhr, will schedule tramadol 50 mg q6hr for pain ?Tried scheduling tramadol however family as well as PT indicates that this caused excessive sedation. ?We will trial gabapentin 100 mg twice daily for lateral hip pain in the event that he may have sent some bruising of his lateral femoral cutaneous nerve on the left side. ?4. Mood: Provide emotional support ?- Antipsychotic agents: N/A ?5. Neuropsych: This patient is capable of making decisions on his own behalf. ?6. Skin/Wound Care: Routine skin checks ?7. Fluids/Electrolytes/Nutrition: Routine in and outs with follow-up chemistries ?8.  Hypertension.    ?-4/17 controlled  continue lisinopril 5 mg daily, 5 mg Norvasc daily.   ?Vitals:  ? 11/12/21 1954 11/13/21 0410  ?BP: (!) 105/57 116/63  ?Pulse: 78 65  ?Resp: 14 14  ?Temp: 98.2 ?F (36.8 ?C) 98 ?F (36.7 ?C)  ?SpO2: 97% 100%  ?  ?9.  Hyperlipidemia continue Lipitor tablet 40 mg at bedtime daily. ?10.  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  Follow-up as outpatient. ?11.  Obesity.  BMI 27.09.  Dietary follow-up ?12.  Urinary hesitation-POVRS/bladder scans to make sure patient's not retaining.  Had mildly elevated BCs urine culture reviewed showed no growth WBCs now are within normal limits. ?13.  Insomnia:  ?  Improved continue melatonin 3 mg HS ?14.  Bradycardia: Can continue to monitor heart rate 3 times daily. ?-4/16 HR today continues to be in upper 50's, asymptomatic ? ?  ?LOS: ?12 days ?A FACE TO FACE EVALUATION WAS PERFORMED ? ?Erick ColaceAndrew E Keoni Risinger, MD ?11/13/2021, 8:32 AM  ? ? ? ?

## 2021-11-13 NOTE — Patient Outreach (Signed)
[facility] Customer service manager [facility]) Care Management ? ?11/13/2021 ? ?Douglas Pruitt ?January 20, 1951 ?[id number] ? ? ?[facility] multidisciplinary care discussion ? ?RN CM completed template and sent to [facility] team 11/12/21 ?For case discussion on 11/16/21 ? ? ?Skyler Dusing L. Noelle Penner, RN, BSN, CCM ?[facility] Telephonic Care Management Care Coordinator ?Office number [phone] ?[facility] number [phone] ?Fax number [phone] ? ? ?

## 2021-11-14 LAB — URINALYSIS, ROUTINE W REFLEX MICROSCOPIC
Bilirubin Urine: NEGATIVE
Glucose, UA: NEGATIVE mg/dL
Hgb urine dipstick: NEGATIVE
Ketones, ur: NEGATIVE mg/dL
Leukocytes,Ua: NEGATIVE
Nitrite: NEGATIVE
Protein, ur: 30 mg/dL — AB
Specific Gravity, Urine: 1.024 (ref 1.005–1.030)
pH: 5 (ref 5.0–8.0)

## 2021-11-14 MED ORDER — TAMSULOSIN HCL 0.4 MG PO CAPS
0.4000 mg | ORAL_CAPSULE | Freq: Every day | ORAL | Status: DC
  Administered 2021-11-14 – 2021-11-28 (×15): 0.4 mg via ORAL
  Filled 2021-11-14 (×15): qty 1

## 2021-11-14 MED ORDER — SENNOSIDES-DOCUSATE SODIUM 8.6-50 MG PO TABS
1.0000 | ORAL_TABLET | Freq: Two times a day (BID) | ORAL | Status: DC
  Administered 2021-11-14 – 2021-11-29 (×29): 1 via ORAL
  Filled 2021-11-14 (×29): qty 1

## 2021-11-14 MED ORDER — MELATONIN 5 MG PO TABS
5.0000 mg | ORAL_TABLET | Freq: Every day | ORAL | Status: DC
  Administered 2021-11-14 – 2021-11-28 (×15): 5 mg via ORAL
  Filled 2021-11-14 (×15): qty 1

## 2021-11-14 MED ORDER — POLYETHYLENE GLYCOL 3350 17 G PO PACK
17.0000 g | PACK | Freq: Every day | ORAL | Status: DC
  Administered 2021-11-14 – 2021-11-25 (×11): 17 g via ORAL
  Filled 2021-11-14 (×14): qty 1

## 2021-11-14 NOTE — Plan of Care (Signed)
?  Problem: Consults ?Goal: RH STROKE PATIENT EDUCATION ?Description: See Patient Education module for education specifics  ?Outcome: Progressing ?  ?Problem: RH SKIN INTEGRITY ?Goal: RH STG MAINTAIN SKIN INTEGRITY WITH ASSISTANCE ?Description: STG Maintain Skin Integrity With Min Assistance. ?Outcome: Progressing ?  ?Problem: RH SAFETY ?Goal: RH STG ADHERE TO SAFETY PRECAUTIONS W/ASSISTANCE/DEVICE ?Description: STG Adhere to Safety Precautions With Cues and Reminder. ?Outcome: Progressing ?Goal: RH STG DECREASED RISK OF FALL WITH ASSISTANCE ?Description: STG Decreased Risk of Fall With Min Assistance. ?Outcome: Progressing ?  ?Problem: RH COGNITION-NURSING ?Goal: RH STG USES MEMORY AIDS/STRATEGIES W/ASSIST TO PROBLEM SOLVE ?Description: STG Uses Memory Aids/Strategies With Min Assistance to Problem Solve. ?Outcome: Progressing ?Goal: RH STG ANTICIPATES NEEDS/CALLS FOR ASSIST W/ASSIST/CUES ?Description: STG Anticipates Needs/Calls for Assist With Cues and Reminders. ?Outcome: Progressing ?  ?Problem: RH KNOWLEDGE DEFICIT ?Goal: RH STG INCREASE KNOWLEDGE OF HYPERTENSION ?Description: Patient will demonstrate knowledge of HTN medications, dietary recommendations, and blood pressure parameters with educational materials and handouts provided by staff independently at discharge. ? ?Outcome: Progressing ?Goal: RH STG INCREASE KNOWLEGDE OF HYPERLIPIDEMIA ?Description: Patient will demonstrate knowledge of HLD medications, dietary recommendations, and lab values with educational materials and handouts provided by staff independently at discharge. ? ?Outcome: Progressing ?Goal: RH STG INCREASE KNOWLEDGE OF STROKE PROPHYLAXIS ?Description: Patient will demonstrate knowledge of medications used to prevent future strokes with educational materials and handouts provided by staff independently at discharge. ? ?Outcome: Progressing ?  ?Problem: RH Vision ?Goal: RH LTG Vision (Specify) ?Outcome: Progressing ?  ?

## 2021-11-14 NOTE — Progress Notes (Signed)
Patient ID: Douglas Pruitt, male   DOB: 11/17/1950, 71 y.o.   MRN: [id number] ? ?Team Conference Report to Patient/Family ? ?Team Conference discussion was reviewed with the patient and caregiver, including goals, any changes in plan of care and target discharge date.  Patient and caregiver express understanding and are in agreement.  The patient has a target discharge date of 11/27/21. ? ?Sw spoke with patient daughter and provided conference updates. Daughter concerned about patient medications, sw will have physician follow up. Patient daughter asking about another extension to ensure the patient's home is set up. Sw informed daughter that time should be used now to ensure the patient home is safely set up before d/c date. No additional questions or concerns, sw will continue to follow up. ? ? ? ?Douglas Pruitt ?11/14/2021, 2:03 PM  ?

## 2021-11-14 NOTE — Progress Notes (Signed)
Occupational Therapy Session Note ? ?Patient Details  ?Name: Douglas Pruitt ?MRN: [id number] ?Date of Birth: November 17, 1950 ? ?Today's Date: 11/14/2021 ?OT Individual Time: 2595-6387 ?OT Individual Time Calculation (min): 53 min  ? ? ?Short Term Goals: ?Week 1:  OT Short Term Goal 1 (Week 1): Pt will complete sit to stand at sink with mod A. ?OT Short Term Goal 1 - Progress (Week 1): Met ?OT Short Term Goal 2 (Week 1): Pt will don shirt with min A. ?OT Short Term Goal 2 - Progress (Week 1): Met ?OT Short Term Goal 3 (Week 1): Pt will complete toilet transfer with max A and LRAD. ?OT Short Term Goal 3 - Progress (Week 1): Not met ?OT Short Term Goal 4 (Week 1): Pt will therapeutic position LUE in chair/bed with no more than min VCs. ?OT Short Term Goal 4 - Progress (Week 1): Not met ?Week 2:  OT Short Term Goal 1 (Week 2): Pt will complete toilet transfer with max A and LRAD. ?OT Short Term Goal 2 (Week 2): Pt will therapeutic position LUE in chair/bed with no more than min VCs. ?OT Short Term Goal 3 (Week 2): Pt will don shirt with S. ?OT Short Term Goal 4 (Week 2): Pt will don pants with mod A. ? ?Skilled Therapeutic Interventions/Progress Updates:  ?  Pt received semi-reclined in bed with interpreter present, agreeable to therapy. Session focus on self-care retraining, activity tolerance, LUE NMR, transfer retrianing in prep for improved ADL/IADL/func mobility performance + decreased caregiver burden. ? ?Per SLP, improved reports of oral and LLE pain. MD in/out morning assessment. Came to sitting EOB on his R with mod A to progress hemi-body and to lift trunk. Squat-pivot to his R with light mod A to fully lift and pivot hips/block LLE, +2 present to stabilize w/c.  ? ?Pt declined shower stating it was too cold, agreeable to change out dirty shirt. Stood at sink with heavy min A so therapist could pull up pants. Doffed shirt mod A to pull over head , bathed UB with min A to bathe RUE using hand over hand technique with LUE. Max  A this date to don shirt due to poor initiation/completion of task. Pt with eyes closed frequently throughout session, but declines that he is fatigued.  ? ?Total A w/c transport to and from gym. Heavy max squat pivot to his L, +2 needed to stabilize w/c due to pt with difficulty motor planning/following directions and lifting his hips sufficiently. Completed 5 sit to stands with HW, required consistent mod A to power up and maintain midline/block LLE.  ? ?Pt left seated in w/c with safety belt alarm engaged, call bell in reach, and all immediate needs met. Interpreter present. ? ? ?Therapy Documentation ?Precautions:  ?Precautions ?Precautions: Fall ?Precaution Comments: L eye blind, L hemiplegia ?Restrictions ?Weight Bearing Restrictions: No ? ?Pain: ?  No c/o throughout ?ADL: See Care Tool for more details. ? ? ?Therapy/Group: Individual Therapy ? ?Volanda Napoleon MS, OTR/L ? ?11/14/2021, 7:00 AM ?

## 2021-11-14 NOTE — Progress Notes (Signed)
Pt had no void throughout night. Was bladder scanned for 489. In and out straight cath for 540 ml. Resistance met, readjusted and cath was successful.  ?

## 2021-11-14 NOTE — Plan of Care (Signed)
?  Problem: RH Problem Solving ?Goal: LTG Patient will demonstrate problem solving for (SLP) ?Description: LTG:  Patient will demonstrate problem solving for basic/complex daily situations with cues  (SLP) ?11/14/2021 1038 by Sonny Masters T, CCC-SLP ?Flowsheets (Taken 11/14/2021 1038) ?LTG Patient will demonstrate problem solving for: ? Minimal Assistance - Patient > 75% ? Moderate Assistance - Patient 50 - 74% ?Note: Goal downgraded due to slower than anticipated progress ?Problem: RH Memory ?Goal: LTG Patient will use memory compensatory aids to (SLP) ?Description: LTG:  Patient will use memory compensatory aids to recall biographical/new, daily complex information with cues (SLP) ?11/14/2021 1038 by Sonny Masters T, CCC-SLP ?Note: Goal downgraded due to slower than anticipated progress ?11/14/2021 1036 by Sonny Masters T, CCC-SLP ?Flowsheets (Taken 11/14/2021 1036) ?LTG: Patient will use memory compensatory aids to (SLP): Minimal Assistance - Patient > 75% ?Note: Goal downgraded due to slower than anticipated progress ?  ?

## 2021-11-14 NOTE — Progress Notes (Signed)
?                                                       PROGRESS NOTE ? ? ?Subjective/Complaints: ? ?? sedation from gabapentin vs poor sleep ?Freq small voids , denies burning, required I/O cath last noc ?Left Hip pain improved per OT this am  ? ?ROS: Negatives CP, SOB, N/V/D ? ? ?Objective: ?  ?No results found. ?Recent Labs  ?  11/12/21 ?0601  ?WBC 11.7*  ?HGB 10.2*  ?HCT 28.8*  ?PLT 210  ? ? ?No results for input(s): NA, K, CL, CO2, GLUCOSE, BUN, CREATININE, CALCIUM in the last 72 hours. ? ? ?Intake/Output Summary (Last 24 hours) at 11/14/2021 0904 ?Last data filed at 11/14/2021 [phone] ?Gross per 24 hour  ?Intake 480 ml  ?Output 1450 ml  ?Net -970 ml  ? ?  ? ?  ? ?Physical Exam: ? ?Constitutional: No distress . Vital signs reviewed. ?ENT edentulous  Eyes: EOMI. No discharge. ?Cardiovascular: RRR. No JVD. ?Respiratory: CTA Bilaterally. Normal effort. ?GI: BS +. Non-distended. ?Abdomen: Nontender to palpation and nondistended. ?Musc:  there is mild tenderness of lateral thigh with a moderate degree of palpation ?Extremities: No clubbing, cyanosis, or edema ?Psych: Mood appropriate ? ?Vital Signs ?Blood pressure 118/73, pulse 66, temperature 98 ?F (36.7 ?C), resp. rate 15, height 5' 4" (1.626 m), weight 65.4 kg, SpO2 99 %. ? ? ? ?Assessment/Plan: ?1. Functional deficits which require 3+ hours per day of interdisciplinary therapy in a comprehensive inpatient rehab setting. ?Physiatrist is providing close team supervision and 24 hour management of active medical problems listed below. ?Physiatrist and rehab team continue to assess barriers to discharge/monitor patient progress toward functional and medical goals ? ?Care Tool: ? ?Bathing ? Bathing activity did not occur: Safety/medical concerns ?Body parts bathed by patient: Left arm, Chest, Abdomen, Front perineal area, Right upper leg, Left upper leg, Face, Right lower leg  ? Body parts bathed by helper: Left lower leg, Buttocks ?  ?  ?Bathing assist Assist Level:  Moderate Assistance - Patient 50 - 74% ?  ?  ?Upper Body Dressing/Undressing ?Upper body dressing Upper body dressing/undressing activity did not occur (including orthotics): Safety/medical concerns ?What is the patient wearing?: Pull over shirt ?   ?Upper body assist Assist Level: Moderate Assistance - Patient 50 - 74% (decreased attention to left side) ?   ?Lower Body Dressing/Undressing ?Lower body dressing ? ? ? Lower body dressing activity did not occur: Safety/medical concerns ?What is the patient wearing?: Incontinence brief, Pants ? ?  ? ?Lower body assist Assist for lower body dressing: Maximal Assistance - Patient 25 - 49% (sit>stand with RW) ?   ? ?Toileting ?Toileting Toileting Activity did not occur (Probation officer and hygiene only): N/A (no void or bm)  ?Toileting assist Assist for toileting: Set up assist (urinal) ?  ?  ?Transfers ?Chair/bed transfer ? ?Transfers assist ? Chair/bed transfer activity did not occur: Safety/medical concerns ? ?Chair/bed transfer assist level: Maximal Assistance - Patient 25 - 49% (squat pivot to R side) ?  ?  ?Locomotion ?Ambulation ? ? ?Ambulation assist ? ? Ambulation activity did not occur: Safety/medical concerns ? ?Assist level: 2 helpers ?Assistive device: Other (comment) (rail in hall) ?Max distance: 15  ? ?Walk 10 feet activity ? ? ?Assist ?   ? ?Assist  level: 2 helpers ?Assistive device: Other (comment)  ? ?Walk 50 feet activity ? ? ?Assist Walk 50 feet with 2 turns activity did not occur: Safety/medical concerns ? ?  ?   ? ? ?Walk 150 feet activity ? ? ?Assist Walk 150 feet activity did not occur: Safety/medical concerns ? ?  ?  ?  ? ?Walk 10 feet on uneven surface  ?activity ? ? ?Assist Walk 10 feet on uneven surfaces activity did not occur: Safety/medical concerns ? ? ?  ?   ? ?Wheelchair ? ? ? ? ?Assist Is the patient using a wheelchair?: Yes ?Type of Wheelchair: Manual ?  ? ?Wheelchair assist level: Dependent - Patient 0% ?Max wheelchair distance: 150   ? ? ?Wheelchair 50 feet with 2 turns activity ? ? ? ?Assist ? ?  ?  ? ? ?Assist Level: Dependent - Patient 0%  ? ?Wheelchair 150 feet activity  ? ? ? ?Assist ?   ? ? ?Assist Level: Dependent - Patient 0%  ? ?Blood pressure 118/73, pulse 66, temperature 98 ?F (36.7 ?C), resp. rate 15, height 5' 4" (1.626 m), weight 65.4 kg, SpO2 99 %. ? ?Medical Problem List and Plan: ?1. 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         - Patient may shower ?         - ELOS/Goals: Supervision to min A 11/27/2021, Team conference today please see physician documentation under team conference tab, met with team  to discuss problems,progress, and goals. Formulized individual treatment plan based on medical history, underlying problem and comorbidities.  ?- Continue CIR PT, LT ?2.  Antithrombotics: ?-DVT/anticoagulation: Pharmaceutical: Lovenox ?            -antiplatelet therapy: Aspirin 81 mg daily and Plavix 75 mg daily x3 months then aspirin alone. ?-4/16 Continue 81 mg ASA and 75 mg Plavix ?3. Pain Management: Tylenol as needed, hip pain left lateral contusion, local measures, analgesics  ?Oral pain continue benzocaine gel, oral pantogram negative ?-4/16 Continue tylenol 650 mg qhr, will schedule tramadol 50 mg q6hr for pain ?Tried scheduling tramadol however family as well as PT indicates that this caused excessive sedation. ?We will trial gabapentin 100 mg twice daily for lateral hip pain in the event that he may have sent some bruising of his lateral femoral cutaneous nerve on the left side. ?4. Mood: Provide emotional support ?- Antipsychotic agents: N/A ?5. Neuropsych: This patient is capable of making decisions on his own behalf. ?6. Skin/Wound Care: Routine skin checks ?7. Fluids/Electrolytes/Nutrition: Routine in and outs with follow-up chemistries ?8.  Hypertension.    ?-4/17 controlled  continue lisinopril 5 mg daily, 5 mg Norvasc daily.   ?Vitals:  ? 11/13/21 1816 11/14/21 0438  ?BP: (!) 127/58 118/73  ?Pulse: 71 66  ?Resp:  15  ?Temp: 98.3 ?F (36.8 ?C) 98 ?F (36.7 ?C)  ?SpO2: 96% 99%  ?  ?9.  Hyperlipidemia continue Lipitor tablet 40 mg at bedtime daily. ?10.  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  Follow-up as outpatient. ?11.  Obesity.  BMI 27.09.  Dietary follow-up ?12.  Urinary hesitation-I/O cath last noc , add flomax, check UA C and S  ?13.  Insomnia:  ?Improved continue melatonin 3 mg HS ?14.  Bradycardia: Can continue to monitor heart rate 3 times daily. ?-4/16 HR today continues to be in upper 50's,  asymptomatic ? ?  ?LOS: ?13 days ?A FACE TO FACE EVALUATION WAS PERFORMED ? ?Charlett Blake, MD ?11/14/2021, 9:04 AM  ? ? ? ?

## 2021-11-14 NOTE — Patient Care Conference (Signed)
Inpatient RehabilitationTeam Conference and Plan of Care Update ?Date: 11/14/2021   Time: 10:31 AM  ? ? ?Patient Name: Douglas Pruitt      ?Medical Record Number: [id number]  ?Date of Birth: 1951/03/31 ?Sex: Male         ?Room/Bed: 5C07C/5C07C-01 ?Payor Info: Payor: MEDICARE / Plan: MEDICARE PART A AND B / Product Type: *No Product type* /   ? ?Admit Date/Time:  11/01/2021  5:14 PM ? ?Primary Diagnosis:  Right middle cerebral artery stroke (HCC) ? ?Hospital Problems: Principal Problem: ?  Right middle cerebral artery stroke (HCC) ? ? ? ?Expected Discharge Date: Expected Discharge Date: 11/27/21 ? ?Team Members Present: ?Physician leading conference: Dr. Claudette Laws ?Social Worker Present: Lavera Guise, BSW ?Nurse Present: Chana Bode, RN ?PT Present: Grier Rocher, PT ?OT Present: Annye English, OT ?SLP Present: Eilene Ghazi, SLP ? ?   Current Status/Progress Goal Weekly Team Focus  ?Bowel/Bladder ? ? Continent of B/B. LBM 4/14. PRN senna and sorbital administered. Awaiting results.  Remain continent  Toilet prn   ?Swallow/Nutrition/ Hydration ? ? diet downgraded to Dys1, thin liquids secondary to oral/jaw discomfort; sup A awareness of pocketing and anterior spillage  mod I  diet advancement as tolerated depending on oral pain   ?ADL's ? ? max A LBD, toileting, mod LBB, squat-pivot bathroom transfers, min UBD/bathing seated at sink, S grooming seated at sink; LUE/hand brummstrom level II, impulsive with mobility, L inattention, most limited by LLE pain  CGA bathroom transfers, min LBD/toileting, S remaining ADL  LUE NMR, transfer/self-care/balance retraining, activity tolerance, pt/family/DME/AE education   ?Mobility ? ? Increased anterior thigh pain in LLE not controlled with medications yet and rated 8/10 at worst, Only slight improvement in Impulsivity; Bed mobility = Mod/MinA; Transfers = ModA for sit<>stand and stand pivot, MinA for squat pivot and heavy cueing, Amb = has made it up to ~28ft using RHR  in hallway with MaxA Continues to demonstrate hip ER and knee flexion in any standing activity including amb; decreased safety awareness throughout - unable to urinate yesterday afternoon despite urge, nursing shows bladder scan and in/out cath required  Sitting/ Standing balance with SUP, Bed mobility with SUP, Transfers with SUP to MinA, Ambulation using LRAD with MinA -- may  need downgrading  Continued L hemibody NMR, standing balance, transfers, gait, stairs, education on impulsive behaviors, family education   ?Communication ? ? mod I-to-sup A cues for speech intelligiblity  mod I  speech strategies at the conversation level; awareness of secretion management   ?Safety/Cognition/ Behavioral Observations ? mod A  min-to-mod A  L visual scanning, basic problem solving, functional recall   ?Pain ? ? Pain to left hip. Scheduled tramadol  Pain </=3/10  Assess Qshift and prn   ?Skin ? ? Ecchymosis on left hip. Remainder of skin intact.  Maintain skin integrity  Assess Qshift and prn   ? ? ?Discharge Planning:  ?Discharging back home with daughter able to provide 24/7   ?Team Discussion: ?Patient with painful hip contusion with questionable nerve irritation; pain addressed, medications adjusted.  Reports insomnia due to discomfort. Patient also with impulsiveness, left inattention and decreased awareness with poor participation. Appetite affected by mouth pain and canker sore.  ? ?Patient on target to meet rehab goals: ?Currently needs max assist for lower body bathing a d dressing. Completes squat pivots with mi - mod assist. Able to ambulate 25 ' at the railing however extension/flexion of leg is inconsistent. Anticipate will be w/c level for d/c home  with family. Needs mod assist for problem solving, orientation and memory. SLP with supervision level goals. Goals for discharge set for CGA assist overall with mod assist for gait and min assist for transfers. ? ?*See Care Plan and progress notes for long and  short-term goals.  ? ?Revisions to Treatment Plan:  ?Downgraded OT/PT goals  ?Intermittent catheterizations for urinary retention; MD started flomax ? ?Teaching Needs: ?Safety, transfers, toileting, medications, dietary modifications, etc ?  ?Current Barriers to Discharge: ?Decreased caregiver support and Home enviroment access/layout ? ?Possible Resolutions to Barriers: ?Family education ?24/7 care ?  ? ? Medical Summary ?Current Status: no neuro recovery , c/o hip pain, fatigue, poor sleep ? Barriers to Discharge: Other (comments) ? Barriers to Discharge Comments: insomnia, Left thigh pain ?Possible Resolutions to [employer]: maximize sleep, meds for urinary retention ? ? ?Continued Need for Acute Rehabilitation Level of Care: The patient requires daily medical management by a physician with specialized training in physical medicine and rehabilitation for the following reasons: ?Direction of a multidisciplinary physical rehabilitation program to maximize functional independence : Yes ?Medical management of patient stability for increased activity during participation in [facility] intensive rehabilitation regime.: Yes ?Analysis of laboratory values and/or radiology reports with any subsequent need for medication adjustment and/or medical intervention. : Yes ? ? ?I attest that I was present, lead the team conference, and concur with the assessment and plan of the team. ? ? ?Chana Bode B ?11/14/2021, 2:36 PM  ? ? ? ? ? ? ?

## 2021-11-14 NOTE — Progress Notes (Signed)
Speech Language Pathology Daily Session Note ? ?Patient Details  ?Name: Douglas Pruitt ?MRN: [id number] ?Date of Birth: Aug 07, 1950 ? ?Today's Date: 11/14/2021 ?SLP Individual Time: 0800-0900 ?SLP Individual Time Calculation (min): 60 min ? ?Short Term Goals: ?Week 2: SLP Short Term Goal 1 (Week 2): Patient will consume current diet without overt s/s of aspiration with sup A verbal cues for use of swallowing compensatory strategies. ?SLP Short Term Goal 2 (Week 2): Patient will utilize Douglas Pruitt increased vocal intensity at the sentence level to achieve 100% intelligibility at the mod I level ?SLP Short Term Goal 3 (Week 2): Patient will scan to left field of enviornment during functional tasks with min A verbal and visual cues. ?SLP Short Term Goal 4 (Week 2): Patient will demonstrate functional problem solving for functional and familiar tasks with Min verbal cues. ?SLP Short Term Goal 5 (Week 2): [name] will recall new, daily information with min A verbal cues. ? ?Skilled Therapeutic Interventions: Skilled ST treatment focused on cognitive-communication and swallowing goals. Patient seen with interpreter following morning meal. Pt consumed 25% intake and . Pt continues to complain of left mouth/jaw discomfort but improved today. SLP facilitated oral care with min A for thoroughness; applied orajel to left buccal cavity w/ total A. During oral care, SLP discovered single white pill that was pocketed in left buccal cavity. Pt was able to consume when given in applesauce and liquid rinses. Notified RN; continue to assess for pocketing. May need to consider meds in applesauce if pocketing persists. SLP facilitated working memory, sustained attention, and left visual scanning task with visual memory task on iPad. Pt completed task with mod A verbal and visual cues. Patient was independently oriented to person, place, month, and situation; required min A verbal cues to orient to time (day of week/date, year - "2002"). Patient was  left in bed with alarm activated and immediate needs within reach at end of session. Continue per current plan of care.   ?   ?Pain ?Pain Assessment ?Pain Scale: 0-10 ?Pain Score: 0-No pain ? ?Therapy/Group: Individual Therapy ? ?Douglas Pruitt T [name] ?11/14/2021, 9:03 AM ?
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
## 2021-11-14 NOTE — Progress Notes (Signed)
Physical Therapy Session Note ? ?Patient Details  ?Name: Douglas Pruitt ?MRN: [id number] ?Date of Birth: January 20, 1951 ? ?Today's Date: 11/14/2021 ?PT Individual Time: 1400-1500 ?PT Individual Time Calculation (min): 60 min  ? ?Short Term Goals: ? ?Week 2:  PT Short Term Goal 1 (Week 2): Pt will transfer to and from [facility] with min assist ?PT Short Term Goal 2 (Week 2): Pt will ambulate 46f with max assist of 1. ?PT Short Term Goal 3 (Week 2): Pt will ascend 4 steps wth max assist with 1 UE support ?PT Short Term Goal 4 (Week 2): Pt will perform bed mobility with consistent MinA. ? ?Skilled Therapeutic Interventions/Progress Updates:  ? ?Pt received supine in bed and agreeable to PT. Supine>sit transfer with max assist and max cues cues for sequencing for log roll. Then use of rail to push to sitting. [facility] to poor motor plan and poor lateral translation to pelvis.  ? ?[facility]stand with various AFO with anterior support x 5 with noted improvement with [place] AFO. Performed stepping 2 x 5 BLE with various AFo and improved terminal knee extension with [place] AFO compared to thusane. But PT continued to block LLE in stance to prevent knee collapse. Slide bord transfer to Hmiheight WC with mod assist and cues for sequencing.  ? ?WC propulsion with RLE and RUE/RLE 5 x 260fwith min assist from PT to prevent veer to the L and max cues for sequencing to improve coordination of RLE/RLE. Noted to have extension response through trunk while propelling the WC. Required to assist posterior scoot multiple times with min-mod assist to reduce fall risk from scooting out of chair.  ? ?Pt returned to room and performed slide board transfer to bed with min assist once Slide board in place to the R. Sit>supine completed with min assist at the LLE, and left supine in bed with call bell in reach and all needs met.  ?    ? ? ?   ? ?Therapy Documentation ?Precautions:   ?Precautions ?Precautions: Fall ?Precaution Comments: L eye blind, L hemiplegia ?Restrictions ?Weight Bearing Restrictions: No ? ?Vital Signs: ?Therapy Vitals ?Temp: 98.6 ?F (37 ?C) ?Temp Source: Oral ?Pulse Rate: 70 ?Resp: 16 ?BP: 122/71 ?Patient Position (if appropriate): Lying ?Oxygen Therapy ?SpO2: 100 % ?O2 Device: Room Air ?Pain: ?Faces: hurt a little.  ? ? ?Therapy/Group: Individual Therapy ? ?AuLorie Phenix4/19/2023, 3:29 PM  ?

## 2021-11-15 ENCOUNTER — Inpatient Hospital Stay: Payer: Medicare Other | Admitting: Nurse Practitioner

## 2021-11-15 LAB — CREATININE, SERUM
Creatinine, Ser: 0.63 mg/dL (ref 0.61–1.24)
GFR, Estimated: >60 mL/min (ref >60)

## 2021-11-15 NOTE — Progress Notes (Signed)
Occupational Therapy Session Note ? ?Patient Details  ?Name: Jeziah Kosa ?MRN: [id number] ?Date of Birth: 1950/08/01 ? ?Today's Date: 11/15/2021 ?OT Individual Time: 2725-3664 ?OT Individual Time Calculation (min): 58 min  ? ? ?Short Term Goals: ?Week 1:  OT Short Term Goal 1 (Week 1): Pt will complete sit to stand at sink with mod A. ?OT Short Term Goal 1 - Progress (Week 1): Met ?OT Short Term Goal 2 (Week 1): Pt will don shirt with min A. ?OT Short Term Goal 2 - Progress (Week 1): Met ?OT Short Term Goal 3 (Week 1): Pt will complete toilet transfer with max A and LRAD. ?OT Short Term Goal 3 - Progress (Week 1): Not met ?OT Short Term Goal 4 (Week 1): Pt will therapeutic position LUE in chair/bed with no more than min VCs. ?OT Short Term Goal 4 - Progress (Week 1): Not met ?Week 2:  OT Short Term Goal 1 (Week 2): Pt will complete toilet transfer with max A and LRAD. ?OT Short Term Goal 2 (Week 2): Pt will therapeutic position LUE in chair/bed with no more than min VCs. ?OT Short Term Goal 3 (Week 2): Pt will don shirt with S. ?OT Short Term Goal 4 (Week 2): Pt will don pants with mod A. ? ?Skilled Therapeutic Interventions/Progress Updates:  ?   ?Pt received semi-reclined in bed with interpreter present, agreeable to therapy. Session focus on self-care retraining, activity tolerance, LUE NMR in prep for improved ADL/IADL/func mobility performance + decreased caregiver burden. ? ?Came to sitting EOB with max A to progress BLE off the bed and for RUE placement on bed > L. Total A to do pants, pt with difficulty motor planning/following commands to incorporate hemi techniques, did lose balance x2 posteriorly and L laterally when attempting to thread. Total A to don B shoes and L AFO, squat-pivot to his R mod A to pivot/for BUE/BLE placement. Total A w/c transport to and from gym. ? ?Pt completed 1x15 chest press, forward/backward circles, modified sit-ups, B shoulder flexion, and ball toss. Did require total A to assist  LUE, cues to attend to L and to slow down to improve bimanual integration.  ? ?Pt left semi-reclined in bed with bed alarm engaged, call bell in reach, and all immediate needs met.  ? ?Therapy Documentation ?Precautions:  ?Precautions ?Precautions: Fall ?Precaution Comments: L eye blind, L hemiplegia ?Restrictions ?Weight Bearing Restrictions: No ? ?Pain: ? Ongoing L anterior thigh pain, did not rate, K pad on at end of session ? ?ADL: See Care Tool for more details. ? ? ?Therapy/Group: Individual Therapy ? ?Volanda Napoleon MS, OTR/L ? ?11/15/2021, 6:52 AM ?

## 2021-11-15 NOTE — Progress Notes (Addendum)
?                                                       PROGRESS NOTE ? ? ?Subjective/Complaints: ? ?Family notes sedation , pt states hip pain is better  ? ?ROS: Negatives CP, SOB, N/V/D ? ? ?Objective: ?  ?No results found. ?No results for input(s): WBC, HGB, HCT, PLT in the last 72 hours. ? ?Recent Labs  ?  11/15/21 ?0505  ?CREATININE 0.63  ? ? ? ?Intake/Output Summary (Last 24 hours) at 11/15/2021 4034 ?Last data filed at 11/14/2021 1900 ?Gross per 24 hour  ?Intake 360 ml  ?Output 500 ml  ?Net -140 ml  ? ?  ? ?  ? ?Physical Exam: ? ? ?General: No acute distress ?Mood and affect are appropriate ?Heart: Regular rate and rhythm no rubs murmurs or extra sounds ?Lungs: Clear to auscultation, breathing unlabored, no rales or wheezes ?Abdomen: Positive bowel sounds, soft nontender to palpation, nondistended ?Extremities: No clubbing, cyanosis, or edema ?Skin: No evidence of breakdown, no evidence of rash ? ? ?Abdomen: Nontender to palpation and nondistended. ?Musc:  there is mild tenderness of lateral thigh with a moderate degree of palpation ?Moderate tenderness Right ant thigh  ?Ecchymosis Left lateral thigh  ?Extremities: No clubbing, cyanosis, or edema ?Psych: Mood appropriate ? ?Vital Signs ?Blood pressure 112/65, pulse 65, temperature 98.4 ?F (36.9 ?C), temperature source Oral, resp. rate 17, height 5\' 4"  (1.626 m), weight 66.7 kg, SpO2 100 %. ? ? ? ?Assessment/Plan: ?1. Functional deficits which require 3+ hours per day of interdisciplinary therapy in a comprehensive inpatient rehab setting. ?Physiatrist is providing close team supervision and 24 hour management of active medical problems listed below. ?Physiatrist and rehab team continue to assess barriers to discharge/monitor patient progress toward functional and medical goals ? ?Care Tool: ? ?Bathing ? Bathing activity did not occur: Safety/medical concerns ?Body parts bathed by patient: Left arm, Chest, Abdomen, Front perineal area, Right upper leg, Left  upper leg, Face, Right lower leg  ? Body parts bathed by helper: Left lower leg, Buttocks ?  ?  ?Bathing assist Assist Level: Moderate Assistance - Patient 50 - 74% ?  ?  ?Upper Body Dressing/Undressing ?Upper body dressing Upper body dressing/undressing activity did not occur (including orthotics): Safety/medical concerns ?What is the patient wearing?: Pull over shirt ?   ?Upper body assist Assist Level: Maximal Assistance - Patient 25 - 49% ?   ?Lower Body Dressing/Undressing ?Lower body dressing ? ? ? Lower body dressing activity did not occur: Safety/medical concerns ?What is the patient wearing?: Incontinence brief, Pants ? ?  ? ?Lower body assist Assist for lower body dressing: Maximal Assistance - Patient 25 - 49% (sit>stand with RW) ?   ? ?Toileting ?Toileting Toileting Activity did not occur ( and hygiene only): N/A (no void or bm)  ?Toileting assist Assist for toileting: Set up assist (urinal) ?  ?  ?Transfers ?Chair/bed transfer ? ?Transfers assist ? Chair/bed transfer activity did not occur: Safety/medical concerns ? ?Chair/bed transfer assist level: Maximal Assistance - Patient 25 - 49% (squat pivot to R side) ?  ?  ?Locomotion ?Ambulation ? ? ?Ambulation assist ? ? Ambulation activity did not occur: Safety/medical concerns ? ?Assist level: 2 helpers ?Assistive device: Other (comment) (rail in hall) ?Max distance: 15  ? ?Walk 10  feet activity ? ? ?Assist ?   ? ?Assist level: 2 helpers ?Assistive device: Other (comment)  ? ?Walk 50 feet activity ? ? ?Assist Walk 50 feet with 2 turns activity did not occur: Safety/medical concerns ? ?  ?   ? ? ?Walk 150 feet activity ? ? ?Assist Walk 150 feet activity did not occur: Safety/medical concerns ? ?  ?  ?  ? ?Walk 10 feet on uneven surface  ?activity ? ? ?Assist Walk 10 feet on uneven surfaces activity did not occur: Safety/medical concerns ? ? ?  ?   ? ?Wheelchair ? ? ? ? ?Assist Is the patient using a wheelchair?: Yes ?Type of Wheelchair:  Manual ?  ? ?Wheelchair assist level: Dependent - Patient 0% ?Max wheelchair distance: 150  ? ? ?Wheelchair 50 feet with 2 turns activity ? ? ? ?Assist ? ?  ?  ? ? ?Assist Level: Dependent - Patient 0%  ? ?Wheelchair 150 feet activity  ? ? ? ?Assist ?   ? ? ?Assist Level: Dependent - Patient 0%  ? ?Blood pressure 112/65, pulse 65, temperature 98.4 ?F (36.9 ?C), temperature source Oral, resp. rate 17, height 5\' 4"  (1.626 m), weight 66.7 kg, SpO2 100 %. ? ?Medical Problem List and Plan: ?1. 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         - Patient may shower ?         - ELOS/Goals: Supervision to min A 11/27/2021, ?2.  Antithrombotics: ?-DVT/anticoagulation: Pharmaceutical: Lovenox ?            -antiplatelet therapy: Aspirin 81 mg daily and Plavix 75 mg daily x3 months then aspirin alone. ?-4/16 Continue 81 mg ASA and 75 mg Plavix ?3. Pain Management: Tylenol as needed, hip pain left lateral contusion, local measures, analgesics  ?Oral pain continue benzocaine gel, oral pantogram negative ?-4/16 Continue tylenol 650 mg qhr, will schedule tramadol 50 mg q6hr for pain ?Tried scheduling tramadol however family as well as PT indicates that this caused excessive sedation. ?We will trial gabapentin 100 mg twice daily for lateral hip pain in the event that he may have sent some bruising of his lateral femoral cutaneous nerve on the left side. ?Reviewed femur xray ( prox femur seen on hip xray ) no bony abnormality  ?4. Mood: Provide emotional support ?- Antipsychotic agents: N/A ?5. Neuropsych: This patient is capable of making decisions on his own behalf. ?6. Skin/Wound Care: Routine skin checks ?7. Fluids/Electrolytes/Nutrition: Routine in and outs with follow-up chemistries ?8.  Hypertension.   ?-4/19 controlled  continue lisinopril 5 mg daily, 5 mg Norvasc daily.    ?Vitals:  ? 11/14/21 1943 11/15/21 0409  ?BP: 104/79 112/65  ?Pulse: 73 65  ?Resp: 18 17  ?Temp: 99.8 ?F (37.7 ?C) 98.4 ?F (36.9 ?C)  ?SpO2: 97% 100%  ?  ?9.  Hyperlipidemia continue Lipitor tablet 40 mg at bedtime daily. ?10.  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  Follow-up as outpatient. ?11.  Obesity.  BMI 27.09.  Dietary follow-up ?12.  Urinary hesitation-I/O cath last noc , add flomax, Negative UA C and S  ?13.  Insomnia:  ?Improved continue melatonin 3 mg HS ?14.  Bradycardia: Can continue to monitor heart rate 3 times daily. ?-4/16 HR today continues to be in upper 50's, asymptomatic ? ?  ?LOS: ?14 days ?A FACE TO FACE  EVALUATION WAS PERFORMED ? ?Erick Colace, MD ?11/15/2021, 8:22 AM  ? ? ? ?

## 2021-11-15 NOTE — Progress Notes (Signed)
Physical Therapy Session Note ? ?Patient Details  ?Name: Douglas Pruitt ?MRN: [id number] ?Date of Birth: [birth year] ? ?Today's Date: 11/15/2021 ?PT Individual Time: 1345-1500 ?PT Individual Time Calculation (min): 75 min  ? ?Short Term Goals: ?Week 1:  PT Short Term Goal 1 (Week 1): Pt will transfer to and from Douglas Pruitt with min assist ?PT Short Term Goal 1 - Progress (Week 1): Progressing toward goal ?PT Short Term Goal 2 (Week 1): Pt will ambulate 43f with max assist of 1. ?PT Short Term Goal 2 - Progress (Week 1): Progressing toward goal ?PT Short Term Goal 3 (Week 1): Pt will ascend 4 steps wth max assist with 1 UE support ?PT Short Term Goal 3 - Progress (Week 1): Progressing toward goal ?PT Short Term Goal 4 (Week 1): Pt will perform bed mobility with mod assit consistently ?PT Short Term Goal 4 - Progress (Week 1): Met ?Week 2:  PT Short Term Goal 1 (Week 2): [facility] ?PT Short Term Goal 2 (Week 2): Pt will ambulate 363fwith max assist of 1. ?PT Short Term Goal 3 (Week 2): Pt will ascend 4 steps wth max assist with 1 UE support ?PT Short Term Goal 4 (Week 2): Pt will perform bed mobility with consistent MinA. ?Week 3:    ? ?Skilled Therapeutic Interventions/Progress Updates:  ? ?Pt received supine in bed and agreeable to PT. Attempted urination in urinal, without success. Supine>sit transfer with mod assist and cues for sequencing of log roll. [facility] transfer to [facility] min assist from PT. Transported to rehab gym in Douglas Pruitt board transfers with min assist and cues for WB through the LLE with AFO. Lateral and corssobdy reach with RUE and forced WB through the LUE. With min assist to prevent excessive lateral lean on the L side   ? ?Sit<>stand with AFO and BUE supported on chair in front of pt x 7 with 10 sec hold in standing on last 2. Transported to entrance of [facility]. LAQ, hip flexion, hip abduction/adduction/ hip extension. Trace movement only on the LLE.  Sit<>stand and  standing tolerance with UE supported on bench with LAFO x 3 with 15 sec hold .Douglas Pruitt propulsion with RLE only x 10044fith cues for anterior forward lean to prevent pposteiror pushing response. .  ? ?Pt returned to room and performed Slide board transfer to bed with min assist. Sit>supine completed with min assist on the LLE, and left supine in bed with call bell in reach and all needs met.  ? ?   ? ?Therapy Documentation ?Precautions:  ?Precautions ?Precautions: Fall ?Precaution Comments: L eye blind, L hemiplegia ?Restrictions ?Weight Bearing Restrictions: No ? ?Vital Signs: ?Therapy Vitals ?Temp: 98.7 ?F (37.1 ?C) ?Temp Source: Oral ?Pulse Rate: 68 ?Resp: 16 ?BP: 108/62 ?Patient Position (if appropriate): Lying ?Oxygen Therapy ?SpO2: 100 % ?O2 Device: Room Air ?Pain: ?  Faces: hurts a little. Pt repositioned.  ? ? ? ?Therapy/Group: Individual Therapy ? ?AusLorie Phenix/20/2023, 3:00 PM  ?

## 2021-11-15 NOTE — Progress Notes (Signed)
Speech Language Pathology Daily Session Note ? ?Patient Details  ?Name: Douglas Pruitt ?MRN: [id number] ?Date of Birth: 05-16-1951 ? ?Today's Date: 11/15/2021 ?SLP Individual Time: 6222-9798 ?SLP Individual Time Calculation (min): 57 minutes ?  ?Short Term Goals: ?Week 2: SLP Short Term Goal 1 (Week 2): Patient will consume current diet without overt s/s of aspiration with sup A verbal cues for use of swallowing compensatory strategies. ?SLP Short Term Goal 2 (Week 2): Patient will utilize Excell increased vocal intensity at the sentence level to achieve 100% intelligibility at the mod I level ?SLP Short Term Goal 3 (Week 2): Patient will scan to left field of enviornment during functional tasks with min A verbal and visual cues. ?SLP Short Term Goal 4 (Week 2): Patient will demonstrate functional problem solving for functional and familiar tasks with Min verbal cues. ?SLP Short Term Goal 5 (Week 2): [name] will recall new, daily information with min A verbal cues. ? ?Skilled Therapeutic Interventions: Skilled ST treatment focused on cognitive-communication and swallowing goals. In-person interpreter not available therefore utilized interpreter services over the phone during session. Pt reports feeling well today, better rested and reduced oral discomfort. Pt agreeable to consume breakfast consisting of Dys 1 textures and thin liquids. Pt exhibited mild-to-moderate L anterior labial spillage requiring occasional verbal cues to clear. Pt independently aware of this and cleared with washcloth ~80 of the time. No pocketing observed with Dys 1 textures, nor overt s/sx of aspiration with any tested consistencies. Pt agreeable to trial Dys 2 trials and consumed graham cracker after allowing it to soften with applesauce and in oral cavity for ease of mastication 2/2 edentulous status. Pt with effective mastication, oral clearance post swallows, and no overt s/sx of aspiration. Considering improvement with oral pain, recommend diet  advancement to Dys 2 textures and thin liquids at this time. Pt verbalized understanding and agreement with plan. Will continue to follow for tolerance. SLP facilitated identification of unsafe scenarios and generated solutions via picture scenes with overall max A verbal/visual cues. Pt elicited rather inaccurate responses at times (e.g., women walking with a walker on wet floor - stated "she could drown"; e.g., hot iron left on clothing item - stated "they could fall"). Pt benefited from field of choices question cues to better identify unsafe scenes and process through reasoning. It should be noted patient attempted to urinate in urinal x3 during session but unable to void with each occurrence. Notified nurse. In-person interpreter arrived at end of session. Patient was left in bed with alarm activated and immediate needs within reach at end of session. Continue per current plan of care.   ?   ?Pain ?Pain Assessment ?Pain Scale: 0-10 ?Pain Score: 0-No pain ? ?Therapy/Group: Individual Therapy ? ?Douglas Pruitt T Douglas Pruitt ?11/15/2021, 8:35 AM ?

## 2021-11-16 ENCOUNTER — Other Ambulatory Visit: Payer: Self-pay | Admitting: *Deleted

## 2021-11-16 LAB — URINE CULTURE: Culture: 200 — AB

## 2021-11-16 MED ORDER — MUSCLE RUB 10-15 % EX CREA
TOPICAL_CREAM | Freq: Two times a day (BID) | CUTANEOUS | Status: DC
  Filled 2021-11-16: qty 85

## 2021-11-16 NOTE — Progress Notes (Signed)
?                                                       PROGRESS NOTE ? ? ?Subjective/Complaints: ? ?No issues overnite Left thigh pain not limiting therapy  ? ?ROS: Negatives CP, SOB, N/V/D ? ? ?Objective: ?  ?No results found. ?No results for input(s): WBC, HGB, HCT, PLT in the last 72 hours. ? ?Recent Labs  ?  11/15/21 ?0505  ?CREATININE 0.63  ? ? ? ? ?Intake/Output Summary (Last 24 hours) at 11/16/2021 0839 ?Last data filed at 11/16/2021 0018 ?Gross per 24 hour  ?Intake 360 ml  ?Output 830 ml  ?Net -470 ml  ? ?  ? ?  ? ?Physical Exam: ? ? ?General: No acute distress ?Mood and affect are appropriate ?Heart: Regular rate and rhythm no rubs murmurs or extra sounds ?Lungs: Clear to auscultation, breathing unlabored, no rales or wheezes ?Abdomen: Positive bowel sounds, soft nontender to palpation, nondistended ?Extremities: No clubbing, cyanosis, or edema ?Skin: No evidence of breakdown, no evidence of rash ? ? ?Abdomen: Nontender to palpation and nondistended. ?Musc:  there is no tenderness in the Left thigh points ot prox ant L thigh as painful area with movement  ?Moderate tenderness Right ant thigh  ?Ecchymosis Left lateral thigh  ?Extremities: No clubbing, cyanosis, or edema ?Psych: Mood appropriate ? ?Vital Signs ?Blood pressure 110/75, pulse 64, temperature 98 ?F (36.7 ?C), temperature source Oral, resp. rate 18, height 5\' 4"  (1.626 m), weight 67.1 kg, SpO2 100 %. ? ? ? ?Assessment/Plan: ?1. Functional deficits which require 3+ hours per day of interdisciplinary therapy in a comprehensive inpatient rehab setting. ?Physiatrist is providing close team supervision and 24 hour management of active medical problems listed below. ?Physiatrist and rehab team continue to assess barriers to discharge/monitor patient progress toward functional and medical goals ? ?Care Tool: ? ?Bathing ? Bathing activity did not occur: Safety/medical concerns ?Body parts bathed by patient: Left arm, Chest, Abdomen, Front perineal area,  Right upper leg, Left upper leg, Face, Right lower leg  ? Body parts bathed by helper: Left lower leg, Buttocks ?  ?  ?Bathing assist Assist Level: Moderate Assistance - Patient 50 - 74% ?  ?  ?Upper Body Dressing/Undressing ?Upper body dressing Upper body dressing/undressing activity did not occur (including orthotics): Safety/medical concerns ?What is the patient wearing?: Pull over shirt ?   ?Upper body assist Assist Level: Maximal Assistance - Patient 25 - 49% ?   ?Lower Body Dressing/Undressing ?Lower body dressing ? ? ? Lower body dressing activity did not occur: Safety/medical concerns ?What is the patient wearing?: Incontinence brief, Pants ? ?  ? ?Lower body assist Assist for lower body dressing: Maximal Assistance - Patient 25 - 49% (sit>stand with RW) ?   ? ?Toileting ?Toileting Toileting Activity did not occur (Probation officer and hygiene only): N/A (no void or bm)  ?Toileting assist Assist for toileting: Set up assist (urinal) ?  ?  ?Transfers ?Chair/bed transfer ? ?Transfers assist ? Chair/bed transfer activity did not occur: Safety/medical concerns ? ?Chair/bed transfer assist level: Maximal Assistance - Patient 25 - 49% (squat pivot to R side) ?  ?  ?Locomotion ?Ambulation ? ? ?Ambulation assist ? ? Ambulation activity did not occur: Safety/medical concerns ? ?Assist level: 2 helpers ?Assistive device: Other (comment) (rail in hall) ?  Max distance: 15  ? ?Walk 10 feet activity ? ? ?Assist ?   ? ?Assist level: 2 helpers ?Assistive device: Other (comment)  ? ?Walk 50 feet activity ? ? ?Assist Walk 50 feet with 2 turns activity did not occur: Safety/medical concerns ? ?  ?   ? ? ?Walk 150 feet activity ? ? ?Assist Walk 150 feet activity did not occur: Safety/medical concerns ? ?  ?  ?  ? ?Walk 10 feet on uneven surface  ?activity ? ? ?Assist Walk 10 feet on uneven surfaces activity did not occur: Safety/medical concerns ? ? ?  ?   ? ?Wheelchair ? ? ? ? ?Assist Is the patient using a wheelchair?:  Yes ?Type of Wheelchair: Manual ?  ? ?Wheelchair assist level: Dependent - Patient 0% ?Max wheelchair distance: 150  ? ? ?Wheelchair 50 feet with 2 turns activity ? ? ? ?Assist ? ?  ?  ? ? ?Assist Level: Dependent - Patient 0%  ? ?Wheelchair 150 feet activity  ? ? ? ?Assist ?   ? ? ?Assist Level: Dependent - Patient 0%  ? ?Blood pressure 110/75, pulse 64, temperature 98 ?F (36.7 ?C), temperature source Oral, resp. rate 18, height 5\' 4"  (1.626 m), weight 67.1 kg, SpO2 100 %. ? ?Medical Problem List and Plan: ?1. 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         - Patient may shower ?         - ELOS/Goals: Supervision to min A 11/27/2021, ?2.  Antithrombotics: ?-DVT/anticoagulation: Pharmaceutical: Lovenox ?            -antiplatelet therapy: Aspirin 81 mg daily and Plavix 75 mg daily x3 months then aspirin alone. ?Continue 81 mg ASA and 75 mg Plavix ?3. Pain Management: Tylenol as needed, hip pain left lateral contusion, local measures, analgesics  ?Oral pain continue benzocaine gel, oral pantogram negative ?-4/16 Continue tylenol 650 mg qhr, will schedule tramadol 50 mg q6hr for pain ?Tried scheduling tramadol however family as well as PT indicates that this caused excessive sedation. ?We will d/c gabpentin due to drowsiness, may be restarted if thigh pain increased, will add ben gay  ?Reviewed femur xray ( prox femur seen on hip xray ) no bony abnormality  ?4. Mood: Provide emotional support ?- Antipsychotic agents: N/A ?5. Neuropsych: This patient is capable of making decisions on his own behalf. ?6. Skin/Wound Care: Routine skin checks ?7. Fluids/Electrolytes/Nutrition: Routine in and outs with follow-up chemistries ?8.  Hypertension.   ?-4/21 controlled  continue lisinopril 5 mg daily, 5 mg Norvasc daily.   ?Vitals:  ? 11/15/21 1944 11/16/21 0533  ?BP: 112/66  110/75  ?Pulse: 75 64  ?Resp: 20 18  ?Temp: 98.2 ?F (36.8 ?C) 98 ?F (36.7 ?C)  ?SpO2: 96% 100%  ?  ?9.  Hyperlipidemia continue Lipitor tablet 40 mg at bedtime daily. ?10.  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  Follow-up as outpatient. ?11.  Obesity.  BMI 27.09.  Dietary follow-up ?12.  Urinary hesitation-I/O cath last noc , add flomax, Negative UA C and S , bladder scan showing no sig retention  ?13.  Insomnia:  ?Improved continue melatonin 3 mg HS ?14.  Bradycardia: Can continue to monitor heart rate 3 times daily. ?Improved  ? ?  ?LOS: ?15 days ?A FACE TO FACE EVALUATION WAS PERFORMED ? ?Charlett Blake, MD ?  11/16/2021, 8:39 AM  ? ? ? ?

## 2021-11-16 NOTE — Progress Notes (Signed)
Physical Therapy Session Note ? ?Patient Details  ?Name: Douglas Pruitt ?MRN: [id number] ?Date of Birth: 1950-10-30 ? ?Today's Date: 11/16/2021 ?PT Individual Time: 1430-1500 ?PT Individual Time Calculation (min): 30 min  ? ?Short Term Goals: ?Week 2:  PT Short Term Goal 1 (Week 2): Pt will transfer to and from [facility] with min assist ?PT Short Term Goal 2 (Week 2): Pt will ambulate 37ft with max assist of 1. ?PT Short Term Goal 3 (Week 2): Pt will ascend 4 steps wth max assist with 1 UE support ?PT Short Term Goal 4 (Week 2): Pt will perform bed mobility with consistent MinA. ? ?Skilled Therapeutic Interventions/Progress Updates: Pt presents sitting in w/c and agreeable to participate w/ therapy.  Pt requests to return to bed 2/2 fatigue.  Pt performed slide board transfer w/ min A.  Pt sat EOB for doffing of shoes and L AFO.  Pt required mod A for LEs onto bed w/ reverse logroll to supine.  Pt performed bridging 3 x 5 w/ manual assist for LLE into hooklying position.  Pt scoots hips side to side w/ manual A to maintain LLE hooklying.  Pt performed abd/add w/ AAROM to LLE, HS to BLEs.  Pt remained in bed inalmost sitting position for pt preference.  Bed alarm on and all needs in reach.  ?   ? ?Therapy Documentation ?Precautions:  ?Precautions ?Precautions: Fall ?Precaution Comments: L eye blind, L hemiplegia ?Restrictions ?Weight Bearing Restrictions: No ?General: ?  ?Vital Signs: ?Therapy Vitals ?Temp: 98.5 ?F (36.9 ?C) ?Temp Source: Oral ?Pulse Rate: 79 ?Resp: 18 ?BP: (!) 96/59 ?Patient Position (if appropriate): Sitting ?Oxygen Therapy ?SpO2: 94 % ?O2 Device: Room Air ?Pain: ?  ?  ? ? ? ?Therapy/Group: Individual Therapy ? ?Lucio Edward ?11/16/2021, 3:02 PM  ?

## 2021-11-16 NOTE — Progress Notes (Signed)
Occupational Therapy Session Note ? ?Patient Details  ?Name: Douglas Pruitt ?MRN: [id number] ?Date of Birth: Feb 11, 1951 ? ?Today's Date: 11/16/2021 ?OT Individual Time: [phone] ?OT Individual Time Calculation (min): 53 min  ? ? ?Short Term Goals: ?Week 1:  OT Short Term Goal 1 (Week 1): Pt will complete sit to stand at sink with mod A. ?OT Short Term Goal 1 - Progress (Week 1): Met ?OT Short Term Goal 2 (Week 1): Pt will don shirt with min A. ?OT Short Term Goal 2 - Progress (Week 1): Met ?OT Short Term Goal 3 (Week 1): Pt will complete toilet transfer with max A and LRAD. ?OT Short Term Goal 3 - Progress (Week 1): Not met ?OT Short Term Goal 4 (Week 1): Pt will therapeutic position LUE in chair/bed with no more than min VCs. ?OT Short Term Goal 4 - Progress (Week 1): Not met ?Week 2:  OT Short Term Goal 1 (Week 2): Pt will complete toilet transfer with max A and LRAD. ?OT Short Term Goal 2 (Week 2): Pt will therapeutic position LUE in chair/bed with no more than min VCs. ?OT Short Term Goal 3 (Week 2): Pt will don shirt with S. ?OT Short Term Goal 4 (Week 2): Pt will don pants with mod A. ? ?Skilled Therapeutic Interventions/Progress Updates:  ?  Pt received semi-reclined in bed with interpreter present, agreeable to therapy. Session focus on self-care retraining, activity tolerance, transfer reraining in prep for improved ADL/IADL/func mobility performance + decreased caregiver burden. Max A to assist with LB clothing management and urinal placement, extended time given with no void. Came to sitting EOB with max A to progress BLE off bed + to lift trunk, step by step cuing for sequencing. Squat-pivot to his R with light mod A to fully pivot hips.  ? ?Completed UB bathing at sink with min A to bathe RUE, total A to incorporate LUE functionally. Doffed shirt min A to pull over head, cues to remove LUE from shirt. Donned shirt with min A to thread LUE. Donned pants with max A to thread LLE and to pull over B hips. Heavy mod  A for static standing, pt unable to remove RUE support from sink to assist pulling up pants. Stood to complete oral care with mod A for standing balance, pt did place his R hand in toothpaste with no awareness of error. Total A to don B shoes and L AFO. MD in/out morning rounds. Completed 3 sit to stands at sink with mod A overall with RUE support and assist to block LLE. ? ?Pt left seated in w/c with interpreter and NT present and all immediate needs met.  ? ? ?Therapy Documentation ?Precautions:  ?Precautions ?Precautions: Fall ?Precaution Comments: L eye blind, L hemiplegia ?Restrictions ?Weight Bearing Restrictions: No ? ?Pain: ongoing L anterior thigh pain, reports primary hurts when other's move LLE for him ?  ?ADL: See Care Tool for more details. ? ? ?Therapy/Group: Individual Therapy ? ?Volanda Napoleon MS, OTR/L ? ?11/16/2021, 6:55 AM ?

## 2021-11-16 NOTE — Progress Notes (Signed)
Physical Therapy Session Note ? ?Patient Details  ?Name: Douglas Pruitt ?MRN: [id number] ?Date of Birth: [birth year] ? ?Today's Date: 11/16/2021 ?PT Individual Time: 8366-2947 ?PT Individual Time Calculation (min): 28 min  ? ?Short Term Goals: ? ?Week 2:  PT Short Term Goal 1 (Week 2): Pt will transfer to and from [facility] with min assist ?PT Short Term Goal 2 (Week 2): Pt will ambulate 1f with max assist of 1. ?PT Short Term Goal 3 (Week 2): Pt will ascend 4 steps wth max assist with 1 UE support ?PT Short Term Goal 4 (Week 2): Pt will perform bed mobility with consistent MinA. ? ?Skilled Therapeutic Interventions/Progress Updates:  ? ?Pt received sitting in WC and agreeable to PT. [facility] transfer to Nustep. Nustep reciprocal movement training x 4 min +2 min with therapeuitc rest break between bouts. Mod assist throughout for LLE to maintain neutral ER/IR in the hip. Noted tom have improved LLE activation with reciprocal movement on this day. Sit<>stand with RW and L hand splint and AFO. Lateral support on L knee in standing x 30sec. Stand pivot with RW and mod assist to prevent LOB to the L. WC mobility x 1033fwith min assist and max cues to decreased trunkal extension with poor caryover requiring pt to stop and scoot back in chair with min assist x 2 without the [id number] returned to room and [facility] reach and all needs met.   ? ? ?   ? ?Therapy Documentation ?Precautions:  ?Precautions ?Precautions: Fall ?Precaution Comments: L eye blind, L hemiplegia ?Restrictions ?Weight Bearing Restrictions: No ? ?Vital Signs: ?Therapy Vitals ?Temp: 98.5 ?F (36.9 ?C) ?Temp Source: Oral ?Pulse Rate: 79 ?Resp: 18 ?BP: (!) 96/59 ?Patient Position (if appropriate): Sitting ?Oxygen Therapy ?SpO2: 94 % ?O2 Device: Room Air ?Pain: ?Denies  ? ? ? ?Therapy/Group: Individual Therapy ? ?AusLorie Phenix/21/2023, 3:35 PM  ?

## 2021-11-16 NOTE — Patient Outreach (Signed)
[facility] Customer service manager [facility]) Care Management ? ?11/16/2021 ? ?Douglas Pruitt ?08-Aug-1950 ?[id number] ? ? ?[facility] Care coordination-  [facility] multidisciplinary care discussion (MCD) ? ? ?[facility] RN CM reviewed Epic notes ?[facility] RN CM reviewed and provided [name] update on patient status and progression to the [facility] MCD team ?[facility] RN CM answered questions for MCD team ? ?Pt to be followed by [facility] hospital liaison ? ? ?Matha Masse L. Noelle Penner, RN, BSN, CCM ?[facility] Telephonic Care Management Care Coordinator ?Office number [phone] ?[facility] number [phone] ?Fax number [phone] ? ?

## 2021-11-16 NOTE — Progress Notes (Addendum)
Speech Language Pathology Weekly Progress and Session Note ? ?Patient Details  ?Name: Cinque Magwood ?MRN: [id number] ?Date of Birth: 1951-04-02 ? ?Beginning of progress report period: November 09, 2021 ?End of progress report period: November 16, 2021 ? ?Today's Date: 11/16/2021 ?SLP Individual Time: 4098-1191 ?SLP Individual Time Calculation (min): 45 min and Today's Date: 11/16/2021 ?SLP Missed Time: 15 Minutes ?Missed Time Reason: Other (Comment) (Interpreter unavailable in order to address cognitive tasks) ? ?Short Term Goals: ?Week 2: SLP Short Term Goal 1 (Week 2): [name] ?SLP Short Term Goal 1 - Progress (Week 2): Met ?SLP Short Term Goal 2 (Week 2): Patient will utilize Treyvone increased vocal intensity at the sentence level to achieve 100% intelligibility at the mod I level ?SLP Short Term Goal 2 - Progress (Week 2): Discontinued (comment) (goal met - speech goal no longer being addressed) ?SLP Short Term Goal 3 (Week 2): Patient will scan to left field of enviornment during functional tasks with min A verbal and visual cues. ?SLP Short Term Goal 3 - Progress (Week 2): Not met ?SLP Short Term Goal 4 (Week 2): Patient will demonstrate functional problem solving for functional and familiar tasks with Min verbal cues. ?SLP Short Term Goal 4 - Progress (Week 2): Revised due to lack of progress ?SLP Short Term Goal 5 (Week 2): [name] will recall new, daily information with min A verbal cues. ?SLP Short Term Goal 5 - Progress (Week 2): Revised due to lack of progress ? ?New Short Term Goals: ?Week 3: SLP Short Term Goal 1 (Week 3): Patient will consume current diet without overt s/s of aspiration with use of swallowing compensatory strategies at mod I level ?SLP Short Term Goal 2 (Week 3): Patient will scan to left field of enviornment during functional tasks with min-to-mod A verbal and visual cues. ?SLP Short Term Goal 3 (Week 3): Patient will demonstrate functional problem solving for functional and familiar tasks with Min-to-mod A  verbal cues. ?SLP Short Term Goal 4 (Week 3): [name] will recall new, daily information with min-to-mod A verbal cues. ? ?Weekly Progress Updates: Patient has made slow progress this week, meeting 2 of 5 short-term goals. Cognitive goals have been revised to more accurately reflect status and anticipated outcomes. Patient continues to complete cognitive tasks with overall mod A verbal/visual cues and exhibits most difficulty with problem solving, reasoning, and memory elements. It is important to consider the possible impact of language/cultural differences and through use of interpreters during sessions on pt's performance and understanding of therapeutic tasks. Patient is currently on a dysphagia 2 diet and thin liquids and reports improvement with oral pain but not fully resolved. SLP continuing to monitor tolerance in setting of discomfort and for further diet advancement as tolerated/appropriate. Patient and family education is ongoing. Patient would benefit from continued skilled SLP intervention to maximize cognitive functioning and overall functional independence prior to discharge.   ?  ?Intensity: Minumum of 1-2 x/day, 30 to 90 minutes ?Frequency: 3 to 5 out of 7 days ?Duration/Length of Stay: 5/2 ?Treatment/Interventions: Cognitive remediation/compensation;Dysphagia/aspiration precaution training;Internal/external aids;Speech/Language facilitation;Therapeutic Activities;Environmental controls;Cueing hierarchy;Functional tasks;Patient/family education ? ? ?Daily Session ?Skilled Therapeutic Interventions: Skilled ST treatment focused on swallowing goals. Patient seen upright in wheelchair. Interpreter not present; SLP called interpreter services and they were unable to locate scheduled interpreter nor establish services by phone during time of session. Pt is able to speak/comprehend some basic English thus SLP facilitated partial session with focus on swallowing and oral care. Session concluded 15 minutes  early  and SLP unable to target cognitive goals d/t no interpreter present for higher level communication needs. Patient required encouragement to consume breakfast. Pt initially stated he ate, however meal tray was clearly untouched on bedside table. Pt consumed Dys 1 and Dys 2 textures with mild left anterior spillage, minimal pocketing, mild oral residuals post swallows, effective mastication of dys 2 textures, and no overt s/sx of aspiration. Pt required sup A verbal cues to clear left anterior spillage. Recommend continuation of dys 2 diet, thin liquids. Also required mod A verbal cues to scan left of midline to locate other items on tray. Patient was left in wheelchair with alarm activated and immediate needs within reach at end of session. Continue per current plan of care.     ?General  ?  ?Pain ?  ? ?Therapy/Group: Individual Therapy ? ?Bianney Rockwood T Jilda Kress ?11/16/2021, 7:39 AM ? ? ? ? ? ? ?

## 2021-11-17 NOTE — Progress Notes (Signed)
?                                                       PROGRESS NOTE ? ? ?Subjective/Complaints: ? ?No overnight issues.  Still reports some left thigh pain which does not limit movement. Continues to use muscle rub cream since per nurse has been drowsy with gabapentin. Alert this morning. No complaints of mouth pain today. ? ?ROS: Negatives CP, SOB, N/V/D ? ? ?Objective: ?  ?No results found. ?No results for input(s): WBC, HGB, HCT, PLT in the last 72 hours. ?Recent Labs  ?  11/15/21 ?0505  ?CREATININE 0.63  ? ? ?Intake/Output Summary (Last 24 hours) at 11/17/2021 1103 ?Last data filed at 11/17/2021 1000 ?Gross per 24 hour  ?Intake 240 ml  ?Output 800 ml  ?Net -560 ml  ?  ? ?  ? ?Physical Exam: ? ?Constitutional: No distress . Vital signs reviewed. ?HENT: Oral cavity is moist without bleeding no evidence of gingivitis no not edentulous. Patient is negative for TMJ pain.  Patient is able to open and close mouth without pain and without restrictions in range of motion.  Tongue has some white discoloration, no buccal plaques present. ?Eyes: EOMI. No discharge. ?Cardiovascular: RRR. No JVD. ?Respiratory: CTA Bilaterally. Normal effort. ?GI: BS +. Non-distended. ?Abdomen: Nontender to palpation and nondistended. ?Musc: Full range of motion for all 4 extremities. No swelling of left thigh, no tenderness of left thigh, but some pain with movement. ?Extremities: No clubbing, cyanosis, or edema ?Psych: Mood appropriate ? ?Vital Signs ?Blood pressure (!) 108/58, pulse 62, temperature 98.3 ?F (36.8 ?C), temperature source Oral, resp. rate 16, height 5\' 4"  (1.626 m), weight 67.1 kg, SpO2 98 %. ? ? ? ?Assessment/Plan: ?1. Functional deficits which require 3+ hours per day of interdisciplinary therapy in a comprehensive inpatient rehab setting. ?Physiatrist is providing close team supervision and 24 hour management of active medical problems listed below. ?Physiatrist and rehab team continue to assess barriers to  discharge/monitor patient progress toward functional and medical goals ? ?Care Tool: ? ?Bathing ? Bathing activity did not occur: Safety/medical concerns ?Body parts bathed by patient: Left arm, Chest, Abdomen, Front perineal area, Right upper leg, Left upper leg, Face, Right lower leg  ? Body parts bathed by helper: Left lower leg, Buttocks ?  ?  ?Bathing assist Assist Level: Moderate Assistance - Patient 50 - 74% ?  ?  ?Upper Body Dressing/Undressing ?Upper body dressing Upper body dressing/undressing activity did not occur (including orthotics): Safety/medical concerns ?What is the patient wearing?: Pull over shirt ?   ?Upper body assist Assist Level: Maximal Assistance - Patient 25 - 49% ?   ?Lower Body Dressing/Undressing ?Lower body dressing ? ? ? Lower body dressing activity did not occur: Safety/medical concerns ?What is the patient wearing?: Incontinence brief, Pants ? ?  ? ?Lower body assist Assist for lower body dressing: Maximal Assistance - Patient 25 - 49% (sit>stand with RW) ?   ? ?Toileting ?Toileting Toileting Activity did not occur ( and hygiene only): N/A (no void or bm)  ?Toileting assist Assist for toileting: Set up assist (urinal) ?  ?  ?Transfers ?Chair/bed transfer ? ?Transfers assist ? Chair/bed transfer activity did not occur: Safety/medical concerns ? ?Chair/bed transfer assist level: Minimal Assistance - Patient > 75% (slide board.) ?  ?  ?  Locomotion ?Ambulation ? ? ?Ambulation assist ? ? Ambulation activity did not occur: Safety/medical concerns ? ?Assist level: 2 helpers ?Assistive device: Other (comment) (rail in hall) ?Max distance: 15  ? ?Walk 10 feet activity ? ? ?Assist ?   ? ?Assist level: 2 helpers ?Assistive device: Other (comment)  ? ?Walk 50 feet activity ? ? ?Assist Walk 50 feet with 2 turns activity did not occur: Safety/medical concerns ? ?  ?   ? ? ?Walk 150 feet activity ? ? ?Assist Walk 150 feet activity did not occur: Safety/medical concerns ? ?  ?  ?   ? ?Walk 10 feet on uneven surface  ?activity ? ? ?Assist Walk 10 feet on uneven surfaces activity did not occur: Safety/medical concerns ? ? ?  ?   ? ?Wheelchair ? ? ? ? ?Assist Is the patient using a wheelchair?: Yes ?Type of Wheelchair: Manual ?  ? ?Wheelchair assist level: Dependent - Patient 0% ?Max wheelchair distance: 150  ? ? ?Wheelchair 50 feet with 2 turns activity ? ? ? ?Assist ? ?  ?  ? ? ?Assist Level: Dependent - Patient 0%  ? ?Wheelchair 150 feet activity  ? ? ? ?Assist ?   ? ? ?Assist Level: Dependent - Patient 0%  ? ?Blood pressure (!) 108/58, pulse 62, temperature 98.3 ?F (36.8 ?C), temperature source Oral, resp. rate 16, height 5\' 4"  (1.626 m), weight 67.1 kg, SpO2 98 %. ? ?Medical Problem List and Plan: ?1. 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         - Patient may shower ?         - ELOS/Goals: Supervision to min A 11/27/2021 ?- Continue CIR PT, LT ? ?2.  Antithrombotics: ?-DVT/anticoagulation: Pharmaceutical: Lovenox ?            -antiplatelet therapy: Aspirin 81 mg daily and Plavix 71 mg daily x3 months then aspirin alone. ?3. Pain Management: Tylenol as needed, hip pain left lateral contusion, local measures, analgesics (Tylenol prior to therapy.  Jaw pain with negative exam points to mandibles, only with chewing, examine mandibular x-ray ?-4/22 Gabapentin was d/c on 4/21 due to excessive drowsiness.  ?-Today pain is controlled with muscle rub, tramadol 50 mg q6hr, Tylenol 650 mg q4 hrs.  Alert today but was drowsy overnight and yesterday. ?-Continue to monitor, pain level and degree of drowsiness. ?4. Mood: Provide emotional support ?- Antipsychotic agents: N/A ?5. Neuropsych: This patient is capable of making decisions on his own behalf. ?6. Skin/Wound Care: Routine skin checks ?7. Fluids/Electrolytes/Nutrition: Routine in and outs  with follow-up chemistries ?8.  Hypertension.  4/15 continue lisinopril 5 mg daily, 5 mg Norvasc daily.  Monitor with increased mobility. ?4/22 Continue lisinopril 5 mg daily, 5 mg Norvasc daily.  ? ? ?  11/17/2021  ?  5:13 AM 11/16/2021  ?  8:22 PM 11/16/2021  ?  1:32 PM  ?Vitals with BMI  ?Systolic 108 106 96  ?Diastolic 58 66 59  ?Pulse 62 68 79  ?  ?9.  Hyperlipidemia continue Lipitor tablet 40 mg at bedtime daily. ?10.  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  Follow-up as outpatient. ?11.  Obesity.  BMI 27.09.  Dietary follow-up ?12.  Urinary hesitation-POVRS/bladder scans to make sure patient's not retaining.  Had mildly elevated BCs urine culture reviewed showed no growth WBCs now are within normal limits. ?-  4/22 Urine output WNL, no issues voiding today. ?13.  Insomnia: Continue melatonin 3 mg HS ?14.  Bradycardia: Can continue to monitor heart rate 3 times daily. ?-4/22 HR 60-70's today, CTM TID. ? ?  ?LOS: ?16 days ?A FACE TO FACE EVALUATION WAS PERFORMED ? ?Tressia Miners, FNP ?11/17/2021, 11:03 AM  ? ? ? ?

## 2021-11-17 NOTE — Progress Notes (Signed)
Occupational Therapy Weekly Progress Note ? ?Patient Details  ?Name: Douglas Pruitt ?MRN: [id number] ?Date of Birth: Apr 28, 1951 ? ?Beginning of progress report period: November 10, 2021 ?End of progress report period: November 17, 2021 ? ?Today's Date: 11/17/2021 ?OT Individual Time: 4098-1191 ?OT Individual Time Calculation (min): 54 min  ? ? ?Patient has met 1 of 4 short term goals.  Pt has made slow progress this week towards LTG. Pt has demonstrated improved LLE and oral pain and thus ability to stand on LLE and participate in therapy, but continues to appear more fatigued than initial eval.  Pt cont to be primarily limited by dense L hemiplegia, cognitive deficits, L inattention, and impulsivity, and ongoing LLE and oral pain + fatigue. LUE and hand currently assessed at [place]umm[place] level I and I respectively. Anticipate 24/7 S and mod physical assist required upon DC. Daughter has not yet been present for OT sessions. ? ? ?Patient continues to demonstrate the following deficits: muscle weakness, decreased cardiorespiratoy endurance, impaired timing and sequencing, abnormal tone, unbalanced muscle activation, decreased coordination, and decreased motor planning, decreased visual acuity, decreased midline orientation, decreased attention to left, and decreased motor planning, decreased attention, decreased awareness, decreased problem solving, decreased safety awareness, and decreased memory, and decreased sitting balance, decreased standing balance, decreased postural control, hemiplegia, and decreased balance strategies and therefore will continue to benefit from skilled OT intervention to enhance overall performance with BADL and Reduce care partner burden. ? ?Patient not progressing toward long term goals.  See goal revision..   ? ?OT Short Term Goals ?Week 1:  OT Short Term Goal 1 (Week 1): Pt will complete sit to stand at sink with mod A. ?OT Short Term Goal 1 - Progress (Week 1): Met ?OT Short Term Goal 2 (Week 1): Pt  will don shirt with min A. ?OT Short Term Goal 2 - Progress (Week 1): Met ?OT Short Term Goal 3 (Week 1): Pt will complete toilet transfer with max A and LRAD. ?OT Short Term Goal 3 - Progress (Week 1): Not met ?OT Short Term Goal 4 (Week 1): Pt will therapeutic position LUE in chair/bed with no more than min VCs. ?OT Short Term Goal 4 - Progress (Week 1): Not met ?Week 2:  OT Short Term Goal 1 (Week 2): Pt will complete toilet transfer with max A and LRAD. ?OT Short Term Goal 1 - Progress (Week 2): Met ?OT Short Term Goal 2 (Week 2): Pt will therapeutic position LUE in chair/bed with no more than min VCs. ?OT Short Term Goal 2 - Progress (Week 2): Not met ?OT Short Term Goal 3 (Week 2): Pt will don shirt with S. ?OT Short Term Goal 3 - Progress (Week 2): Not met ?OT Short Term Goal 4 (Week 2): Pt will don pants with mod A. ?OT Short Term Goal 4 - Progress (Week 2): Not met ?Week 3:  OT Short Term Goal 1 (Week 3): Pt will complete standing grooming task with mod A. ?OT Short Term Goal 2 (Week 3): Pt will pull down pants during toileting with mod A for balance. ?OT Short Term Goal 3 (Week 3): Pt will complete squat-pivot transfer to droparm 3in1 with mod A. ?OT Short Term Goal 4 (Week 3): Pt will verbalize >2 safe positioning strategies for LUE. ? ?Skilled Therapeutic Interventions/Progress Updates:  ?  Pt received semi-reclined in bed, no interpreter present throughout, agreeable to therapy. Session focus on self-care retraining, activity tolerance, LUE NMR in prep for improved ADL/IADL/func mobility performance +  decreased caregiver burden. Reports need to urinate, max A to come to sitting EOB on his L to lift trunk and progress BLE bed. Light mod A to power up in stedy > toilet, continent void of bladder, total A to change out brief and manage clothing over B hips.  ? ?Doffed shirt with min A to pull over head, min A to thread LUE to don. Pt bathed UB with min A to bathe RUE, total A to incorporate LUE for NMR.  Applied deodorant with only assist to lift LUE. Stedy transfer with min A to power up and maintain midline > recliner.  ? ?Attempted to call interpreter line, but no [place] interpreter currently available.  ? ?Therapist applied Saebo Stim One NMES to the L shoulder (anterior/posterior deltoid) for treatment of subluxation set at the below settings:   ?  ?Saebo Stim One ?Intensity: 8 clicks ?Duration: 60 min ?330 pulse width ?35 Hz pulse rate ?On 8 sec/ off 8 sec ?Ramp up/ down 2 sec ?Symmetrical Biphasic wave form ?Max intensity at 500 Ohm load ? ?Pt denies pain and skin in intact. ? ?Pt left seated in recliner with safety belt alarm engaged, call bell in reach, and all immediate needs met.  ? ? ?Therapy Documentation ?Precautions:  ?Precautions ?Precautions: Fall ?Precaution Comments: L eye blind, L hemiplegia ?Restrictions ?Weight Bearing Restrictions: No ? ?Pain: ? No s/sx this date ?ADL: See Care Tool for more details. ? ? ?Therapy/Group: Individual Therapy ? ?Claudie Revering MS, OTR/L ? ?11/17/2021, 6:58 AM  ?

## 2021-11-18 MED ORDER — TRAMADOL HCL 50 MG PO TABS
25.0000 mg | ORAL_TABLET | Freq: Four times a day (QID) | ORAL | Status: DC
  Administered 2021-11-18 – 2021-11-29 (×12): 25 mg via ORAL
  Filled 2021-11-18 (×16): qty 1

## 2021-11-18 NOTE — Progress Notes (Signed)
?                                                       PROGRESS NOTE ? ? ?Subjective/Complaints: ? ?No overnight issues but still continues to have some drowsiness with taking 50 mg gabapentin q6 hrs.  Gabapentin was stopped 2 days ago for apparent drowsiness.  Currently using muscle rube for left upper thigh pain.  Denies mouth pain today. ? ?ROS: Negatives CP, SOB, N/V/D ? ? ?Objective: ?  ?No results found. ?No results for input(s): WBC, HGB, HCT, PLT in the last 72 hours. ?No results for input(s): NA, K, CL, CO2, GLUCOSE, BUN, CREATININE, CALCIUM in the last 72 hours. ? ? ?Intake/Output Summary (Last 24 hours) at 11/18/2021 0937 ?Last data filed at 11/18/2021 0844 ?Gross per 24 hour  ?Intake 740 ml  ?Output 1189 ml  ?Net -449 ml  ?  ? ?  ? ?Physical Exam: ? ?Constitutional: No distress . Vital signs reviewed. Some drowsiness today. ?HENT: Oral cavity is moist without bleeding no evidence of gingivitis no not edentulous. Patient is negative for TMJ pain.  Patient is able to open and close mouth without pain and without restrictions in range of motion.  Tongue has some white discoloration, no buccal plaques present. ?Eyes: EOMI. No discharge. ?Cardiovascular: RRR. No JVD. ?Respiratory: CTA Bilaterally. Normal effort. ?GI: BS +. Non-distended. ?Abdomen: Nontender to palpation and nondistended. ?Musc: Full range of motion for all 4 extremities. No swelling of left thigh, no tenderness of left thigh, but some pain with movement. ?Extremities: No clubbing, cyanosis, or edema ?Psych: Mood appropriate ? ?Vital Signs ?Blood pressure (!) 94/57, pulse 66, temperature (!) 97.3 ?F (36.3 ?C), temperature source Oral, resp. rate 18, height 5\' 4"  (1.626 m), weight 67.1 kg, SpO2 100 %. ? ? ? ?Assessment/Plan: ?1. Functional deficits which require 3+ hours per day of interdisciplinary therapy in a comprehensive inpatient rehab setting. ?Physiatrist is providing close team supervision and 24 hour management of active medical  problems listed below. ?Physiatrist and rehab team continue to assess barriers to discharge/monitor patient progress toward functional and medical goals ? ?Care Tool: ? ?Bathing ? Bathing activity did not occur: Safety/medical concerns ?Body parts bathed by patient: Left arm, Chest, Abdomen, Front perineal area, Right upper leg, Left upper leg, Face, Right lower leg  ? Body parts bathed by helper: Left lower leg, Buttocks ?  ?  ?Bathing assist Assist Level: Moderate Assistance - Patient 50 - 74% ?  ?  ?Upper Body Dressing/Undressing ?Upper body dressing Upper body dressing/undressing activity did not occur (including orthotics): Safety/medical concerns ?What is the patient wearing?: Pull over shirt ?   ?Upper body assist Assist Level: Maximal Assistance - Patient 25 - 49% ?   ?Lower Body Dressing/Undressing ?Lower body dressing ? ? ? Lower body dressing activity did not occur: Safety/medical concerns ?What is the patient wearing?: Incontinence brief, Pants ? ?  ? ?Lower body assist Assist for lower body dressing: Maximal Assistance - Patient 25 - 49% (sit>stand with RW) ?   ? ?Toileting ?Toileting Toileting Activity did not occur (AcupuncturistClothing management and hygiene only): N/A (no void or bm)  ?Toileting assist Assist for toileting: Set up assist (urinal) ?  ?  ?Transfers ?Chair/bed transfer ? ?Transfers assist ? Chair/bed transfer activity did not occur: Safety/medical concerns ? ?Chair/bed transfer  assist level: Minimal Assistance - Patient > 75% (slide board.) ?  ?  ?Locomotion ?Ambulation ? ? ?Ambulation assist ? ? Ambulation activity did not occur: Safety/medical concerns ? ?Assist level: 2 helpers ?Assistive device: Other (comment) (rail in hall) ?Max distance: 15  ? ?Walk 10 feet activity ? ? ?Assist ?   ? ?Assist level: 2 helpers ?Assistive device: Other (comment)  ? ?Walk 50 feet activity ? ? ?Assist Walk 50 feet with 2 turns activity did not occur: Safety/medical concerns ? ?  ?   ? ? ?Walk 150 feet  activity ? ? ?Assist Walk 150 feet activity did not occur: Safety/medical concerns ? ?  ?  ?  ? ?Walk 10 feet on uneven surface  ?activity ? ? ?Assist Walk 10 feet on uneven surfaces activity did not occur: Safety/medical concerns ? ? ?  ?   ? ?Wheelchair ? ? ? ? ?Assist Is the patient using a wheelchair?: Yes ?Type of Wheelchair: Manual ?  ? ?Wheelchair assist level: Dependent - Patient 0% ?Max wheelchair distance: 150  ? ? ?Wheelchair 50 feet with 2 turns activity ? ? ? ?Assist ? ?  ?  ? ? ?Assist Level: Dependent - Patient 0%  ? ?Wheelchair 150 feet activity  ? ? ? ?Assist ?   ? ? ?Assist Level: Dependent - Patient 0%  ? ?Blood pressure (!) 94/57, pulse 66, temperature (!) 97.3 ?F (36.3 ?C), temperature source Oral, resp. rate 18, height 5\' 4"  (1.626 m), weight 67.1 kg, SpO2 100 %. ? ?Medical Problem List and Plan: ?1. 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         - Patient may shower ?         - ELOS/Goals: Supervision to min A 11/27/2021 ?- Continue CIR PT, LT ? ?2.  Antithrombotics: ?-DVT/anticoagulation: Pharmaceutical: Lovenox ?            -antiplatelet therapy: Aspirin 81 mg daily and Plavix 71 mg daily x3 months then aspirin alone. ?3. Pain Management: Tylenol as needed, hip pain left lateral contusion, local measures, analgesics (Tylenol prior to therapy.  Jaw pain with negative exam points to mandibles, only with chewing, examine mandibular x-ray ?-4/22 Gabapentin was d/c on 4/21 due to excessive drowsiness.  ?-Today pain is controlled with muscle rub, tramadol 50 mg q6hr, Tylenol 650 mg q4 hrs.  Alert today but was drowsy overnight and yesterday. ?-Continue to monitor, pain level and degree of drowsiness. ?-4/23 Pt still having some drowsiness today, decreased tramadol to 25 mg q6hr, per pharmacy recommendation, while monitoring effectiveness of  treatment and pain level. ?4. Mood: Provide emotional support ?- Antipsychotic agents: N/A ?5. Neuropsych: This patient is capable of making decisions on his own behalf. ?6. Skin/Wound Care: Routine skin checks ?7. Fluids/Electrolytes/Nutrition: Routine in and outs with follow-up chemistries ?8.  Hypertension.  4/15 continue lisinopril 5 mg daily, 5 mg Norvasc daily.  Monitor with increased mobility. ?4/22 Continue lisinopril 5 mg daily, 5 mg Norvasc daily.  ?4/23 Noticed some "soft" b/p's today.  Per Dr. 5/23 stop Norvasc, push po fluids, and hold 5 mg lisinopril for SBP < 100. Communicate plan to nursing staff. ? ? ?  11/18/2021  ?  6:42 AM 11/17/2021  ?  8:05 PM 11/17/2021  ?  1:35 PM  ?Vitals with BMI  ?Systolic 94 102 95  ?Diastolic 57 59 60  ?Pulse 66  64 64  ?  ?9.  Hyperlipidemia continue Lipitor tablet 40 mg at bedtime daily. ?10.  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  Follow-up as outpatient. ?11.  Obesity.  BMI 27.09.  Dietary follow-up ?12.  Urinary hesitation-POVRS/bladder scans to make sure patient's not retaining.  Had mildly elevated BCs urine culture reviewed showed no growth WBCs now are within normal limits. ?-4/23 Urine output WNL, no issues voiding today. ?13.  Insomnia: Continue melatonin 3 mg HS ?14.  Bradycardia: Can continue to monitor heart rate 3 times daily. ?-4/23 HR 60-70's today, CTM TID. ? ?  ?LOS: ?17 days ?A FACE TO FACE EVALUATION WAS PERFORMED ? ?Tressia Miners, FNP ?11/18/2021, 9:37 AM  ? ? ? ?

## 2021-11-19 LAB — URINALYSIS, ROUTINE W REFLEX MICROSCOPIC
Bilirubin Urine: NEGATIVE
Glucose, UA: NEGATIVE mg/dL
Hgb urine dipstick: NEGATIVE
Ketones, ur: NEGATIVE mg/dL
Nitrite: NEGATIVE
Protein, ur: NEGATIVE mg/dL
Specific Gravity, Urine: 1.017 (ref 1.005–1.030)
pH: 6 (ref 5.0–8.0)

## 2021-11-19 LAB — CBC WITH DIFFERENTIAL/PLATELET
Abs Immature Granulocytes: 0.06 10*3/uL (ref 0.00–0.07)
Basophils Absolute: 0.1 10*3/uL (ref 0.0–0.1)
Basophils Relative: 0 %
Eosinophils Absolute: 0.1 10*3/uL (ref 0.0–0.5)
Eosinophils Relative: 0 %
HCT: 30.4 % — ABNORMAL LOW (ref 39.0–52.0)
Hemoglobin: 10.5 g/dL — ABNORMAL LOW (ref 13.0–17.0)
Immature Granulocytes: 1 %
Lymphocytes Relative: 24 %
Lymphs Abs: 2.7 10*3/uL (ref 0.7–4.0)
MCH: 29.2 pg (ref 26.0–34.0)
MCHC: 34.5 g/dL (ref 30.0–36.0)
MCV: 84.4 fL (ref 80.0–100.0)
Monocytes Absolute: 0.7 10*3/uL (ref 0.1–1.0)
Monocytes Relative: 6 %
Neutro Abs: 7.8 10*3/uL — ABNORMAL HIGH (ref 1.7–7.7)
Neutrophils Relative %: 69 %
Platelets: 307 10*3/uL (ref 150–400)
RBC: 3.6 MIL/uL — ABNORMAL LOW (ref 4.22–5.81)
RDW: 12.1 % (ref 11.5–15.5)
WBC: 11.3 10*3/uL — ABNORMAL HIGH (ref 4.0–10.5)
nRBC: 0 % (ref 0.0–0.2)

## 2021-11-19 LAB — BASIC METABOLIC PANEL
Anion gap: 9 (ref 5–15)
BUN: 11 mg/dL (ref 8–23)
CO2: 28 mmol/L (ref 22–32)
Calcium: 8.8 mg/dL — ABNORMAL LOW (ref 8.9–10.3)
Chloride: 94 mmol/L — ABNORMAL LOW (ref 98–111)
Creatinine, Ser: 0.7 mg/dL (ref 0.61–1.24)
GFR, Estimated: >60 mL/min (ref >60)
Glucose, Bld: 120 mg/dL — ABNORMAL HIGH (ref 70–99)
Potassium: 3.6 mmol/L (ref 3.5–5.1)
Sodium: 131 mmol/L — ABNORMAL LOW (ref 135–145)

## 2021-11-19 MED ORDER — LISINOPRIL 5 MG PO TABS
2.5000 mg | ORAL_TABLET | Freq: Every day | ORAL | Status: DC
  Administered 2021-11-20 – 2021-11-29 (×10): 2.5 mg via ORAL
  Filled 2021-11-19 (×10): qty 1

## 2021-11-19 MED ORDER — LINACLOTIDE 145 MCG PO CAPS
290.0000 ug | ORAL_CAPSULE | Freq: Every day | ORAL | Status: DC
  Administered 2021-11-20 – 2021-11-29 (×10): 290 ug via ORAL
  Filled 2021-11-19 (×3): qty 2
  Filled 2021-11-19: qty 1
  Filled 2021-11-19 (×7): qty 2

## 2021-11-19 NOTE — Progress Notes (Signed)
Physical Therapy Session Note ? ?Patient Details  ?Name: Douglas Pruitt ?MRN: [id number] ?Date of Birth: August 07, 1950 ? ?Today's Date: 11/19/2021 ?PT Individual Time: 2585-2778 ?PT Individual Time Calculation (min): 56 min  ? ?Short Term Goals: ?Week 2:  PT Short Term Goal 1 (Week 2): Pt will transfer to and from [facility] with min assist ?PT Short Term Goal 2 (Week 2): Pt will ambulate 28ft with max assist of 1. ?PT Short Term Goal 3 (Week 2): Pt will ascend 4 steps wth max assist with 1 UE support ?PT Short Term Goal 4 (Week 2): Pt will perform bed mobility with consistent MinA. ? ? ?Skilled Therapeutic Interventions/Progress Updates:  ?Patient supine in bed asleep on entrance to room. In-person interpreter present. Patient requires time to wake, become alert and agreeable to PT session.  ? ?Patient with no pain complaint at start of session. ? ?Therapeutic Activity: ?Bed Mobility: Patient performed supine --> sit with difficulty bringing Bil feet off EOB. Requires Mod/ MaxA to reach seated position on EOB.  VC/ tc required for initiation, technique, and sequence. At end of session, pt is able to return to supine from seated position at EOB with supervision and RLE assisting LLE onto bed surface. While seated EOB, pt assisted with donning of shoes with L AFO. ?Transfers: Patient performed squat pivot transfers throughout session toward R side with Min/ ModA. Provided vc/tc for technique, slow performance, target seat. ? ?Neuromuscular Re-ed: ?NMR facilitated during session with focus on sitting balance, standing balance, pregait training. Pt guided in seated repositioning of lateral angle of pelvis using bed positioning inorder to promote proprioception and midline adjustments for balance. Pt initially requires vc for correction to midline. With raise of L hip, pt demos good adjustment of trunk/ UB to L in order to attempt to maintain midline.  ? ?Next pt guided in sit<>stand training and fwd/ bkwd stepping of RLE with block to  Lknee. Instructions and physical demo of required slow step and weight shift into RLE. Pt performs quikly at first requiring max cueing and additional education. MaxA to L knee to prevent buckle and ER. Difficulty with appropriate weight shift requiring Mod/ MaxA to complete stepping activity. Pt continues to appear discouraged and fatigued. Pt provided with education re: slow but continued return of L sided functionality, improved ability to transition and transfer to place where pt does not require as much additional physical assist.  ? ?NMR performed for improvements in motor control and coordination, balance, sequencing, judgement, and self confidence/ efficacy in performing all aspects of mobility at highest level of independence.  ? ?Patient supine  in bed at end of session with brakes locked, bed alarm set, and all needs within reach. ? ? ?Therapy Documentation ?Precautions:  ?Precautions ?Precautions: Fall ?Precaution Comments: L eye blind, L hemiplegia ?Restrictions ?Weight Bearing Restrictions: No ?General: ?  ?Vital Signs: ?  ?Pain: ? Pt c/o L thigh pain with all standing activity. Addressed with repositioning. ? ?Therapy/Group: Individual Therapy ? ?Loel Dubonnet PT, DPT ?11/19/2021, 6:10 PM  ?

## 2021-11-19 NOTE — Progress Notes (Signed)
Speech Language Pathology Daily Session Note ? ?Patient Details  ?Name: Douglas Pruitt ?MRN: [id number] ?Date of Birth: Oct 05, 1950 ? ?Today's Date: 11/19/2021 ?SLP Individual Time: 0102-7253 ?SLP Individual Time Calculation (min): 57 minutes ?  ?Short Term Goals: ?Week 2: SLP Short Term Goal 1 (Week 2): [name] ?SLP Short Term Goal 1 - Progress (Week 2): Met ?SLP Short Term Goal 2 (Week 2): Patient will utilize Graviel increased vocal intensity at the sentence level to achieve 100% intelligibility at the mod I level ?SLP Short Term Goal 2 - Progress (Week 2): Discontinued (comment) (goal met - speech goal no longer being addressed) ?SLP Short Term Goal 3 (Week 2): Patient will scan to left field of enviornment during functional tasks with min A verbal and visual cues. ?SLP Short Term Goal 3 - Progress (Week 2): Not met ?SLP Short Term Goal 4 (Week 2): Patient will demonstrate functional problem solving for functional and familiar tasks with Min verbal cues. ?SLP Short Term Goal 4 - Progress (Week 2): Revised due to lack of progress ?SLP Short Term Goal 5 (Week 2): [name] will recall new, daily information with min A verbal cues. ?SLP Short Term Goal 5 - Progress (Week 2): Revised due to lack of progress ? ?Skilled Therapeutic Interventions: Skilled ST treatment focused on swallowing and cognitive goals. Pt was sleeping supine on arrival with interpreter present. Pt roused to min verbal stimuli; kept eyes closed intermittently throughout treatment. Pt requested to use urinal and voided bladder. During this process however SLP observed soiled brief likely from previous incontinent episode. SLP performed peri care, changed brief and gown with overall mod A; pt followed commands with min A verbal cues. Nurse arrived for meds. Pt consumed whole pills with thin liquids by straw without overt s/sx of aspiration; required multiple attempts to orally clear; no pocketing observed. Pt given Nystatin to rinse and swallow; white coating still  visible on lingual surface. Pt performed oral care both before and following meal with sup A verbal cues for thoroughness and with cues to clean mid-to-back of tongue. Pt performed self feeding with mod A verbal cues to scan L of midline to locate items on tray. Pt consumed breakfast with mildly prolonged yet effective mastication of Dys 2 textures and mild anterior labial spillage on left requiring mod A verbal cues fading to sup A verbal cues to clear. No overt s/sx of aspiration observed with both solids and liquids. Recommend continuation of Dys 2 diet, thin liquids at this time. Pt verbalized preference for "very soft" foods at this time secondary to edentulous status; tends to eat soft foods at home. Will discuss possible consideration of Dys 3 trials at following session.  Patient was left in bed with alarm activated and immediate needs within reach at end of session. Continue per current plan of care.   ?   ?Pain ?Pain Assessment ?Pain Scale: 0-10 ?Pain Score: 0-No pain ? ?Therapy/Group: Individual Therapy ? ?Graden Hoshino T Dillion Stowers ?11/19/2021, 8:58 AM ?

## 2021-11-19 NOTE — Progress Notes (Signed)
Occupational Therapy Session Note ? ?Patient Details  ?Name: Douglas Pruitt ?MRN: [id number] ?Date of Birth: 06-22-1951 ? ?Today's Date: 11/19/2021 ?OT Individual Time: 4403-4742 ?OT Individual Time Calculation (min): 60 min  ? ? ?Short Term Goals: ?Week 3:  OT Short Term Goal 1 (Week 3): Pt will complete standing grooming task with mod A. ?OT Short Term Goal 2 (Week 3): Pt will pull down pants during toileting with mod A for balance. ?OT Short Term Goal 3 (Week 3): Pt will complete squat-pivot transfer to droparm 3in1 with mod A. ?OT Short Term Goal 4 (Week 3): Pt will verbalize >2 safe positioning strategies for LUE. ? ?Skilled Therapeutic Interventions/Progress Updates:  ?  Patient received reclined supine in bed.  Interpreter present throughout session.  Patient difficult to arouse initially.  Patient indicates no pain when he does not move left leg - but pain consistently with left leg movement.  Patient reports mouth pain has resolved.   ?Patient assisted to sitting at edge of flat bed.  Patient able to maintain static sitting at times without assistance, and even recover balance when he started falling toward left side x 3.  Patient moves very quickly, and movement is perseverative - so overt stops were helpful in having him regain balance before actual fall.   ?Neuromuscular reeducation to address left UE activation.  Patient in standing pushing to weaker left side.  In sitting - avoids left side - needs facilitation and cueing to weight shift to midline.  When he shifted weight slightly to left - and trunk was erect, he had trace movement in arm - very delayed.  Worked on a moveable surface, and facilitation to maintain weight shift left to address slight shoulder flex/ext, IR.  Patient with report of pain in left thigh with excessive weight shift over to left hip.   ? ?Therapy Documentation ?Precautions:  ?Precautions ?Precautions: Fall ?Precaution Comments: L eye blind, L hemiplegia ?Restrictions ?Weight Bearing  Restrictions: No ? ? ?Pain: ?Pain Assessment ?Pain Scale: 0-10 ?Pain Score: 5 - Left thigh - mid quad ? ? ? ?Therapy/Group: Individual Therapy ? ?[name] ?11/19/2021, 12:07 PM ?

## 2021-11-19 NOTE — Progress Notes (Addendum)
?                                                       PROGRESS NOTE ? ? ?Subjective/Complaints: ? ?Seen in OT, interpreter present  ? ?ROS: Negatives CP, SOB, N/V/D ? ? ?Objective: ?  ?No results found. ?No results for input(s): WBC, HGB, HCT, PLT in the last 72 hours. ?No results for input(s): NA, K, CL, CO2, GLUCOSE, BUN, CREATININE, CALCIUM in the last 72 hours. ? ? ?Intake/Output Summary (Last 24 hours) at 11/19/2021 0950 ?Last data filed at 11/19/2021 0847 ?Gross per 24 hour  ?Intake 480 ml  ?Output 1125 ml  ?Net -645 ml  ? ?  ? ?  ? ?Physical Exam: ? ? ?General: No acute distress ?Mood and affect are appropriate ?Heart: Regular rate and rhythm no rubs murmurs or extra sounds ?Lungs: Clear to auscultation, breathing unlabored, no rales or wheezes ?Abdomen: Positive bowel sounds, soft nontender to palpation, nondistended ?Extremities: No clubbing, cyanosis, or edema ?Skin: No evidence of breakdown, no evidence of rash ?Neurologic: Cranial nerves II through XII intact, motor strength is 5/5 in right and 0/5 left deltoid, bicep, tricep, grip, hip flexor, knee extensors, ankle dorsiflexor and plantar flexor ? ?Musculoskeletal: Full range of motion in all 4 extremities. Mild tenderness Left hip add  ? ? ?Vital Signs ?Blood pressure 90/78, pulse (!) 59, temperature 99.4 ?F (37.4 ?C), temperature source Oral, resp. rate 17, height 5\' 4"  (1.626 m), weight 67.1 kg, SpO2 99 %. ? ? ? ?Assessment/Plan: ?1. Functional deficits which require 3+ hours per day of interdisciplinary therapy in a comprehensive inpatient rehab setting. ?Physiatrist is providing close team supervision and 24 hour management of active medical problems listed below. ?Physiatrist and rehab team continue to assess barriers to discharge/monitor patient progress toward functional and medical goals ? ?Care Tool: ? ?Bathing ? Bathing activity did not occur: Safety/medical concerns ?Body parts bathed by patient: Left arm, Chest, Abdomen, Front perineal  area, Right upper leg, Left upper leg, Face, Right lower leg  ? Body parts bathed by helper: Left lower leg, Buttocks ?  ?  ?Bathing assist Assist Level: Moderate Assistance - Patient 50 - 74% ?  ?  ?Upper Body Dressing/Undressing ?Upper body dressing Upper body dressing/undressing activity did not occur (including orthotics): Safety/medical concerns ?What is the patient wearing?: Pull over shirt ?   ?Upper body assist Assist Level: Maximal Assistance - Patient 25 - 49% ?   ?Lower Body Dressing/Undressing ?Lower body dressing ? ? ? Lower body dressing activity did not occur: Safety/medical concerns ?What is the patient wearing?: Incontinence brief, Pants ? ?  ? ?Lower body assist Assist for lower body dressing: Maximal Assistance - Patient 25 - 49% (sit>stand with RW) ?   ? ?Toileting ?Toileting Toileting Activity did not occur ( and hygiene only): N/A (no void or bm)  ?Toileting assist Assist for toileting: Set up assist (urinal) ?  ?  ?Transfers ?Chair/bed transfer ? ?Transfers assist ? Chair/bed transfer activity did not occur: Safety/medical concerns ? ?Chair/bed transfer assist level: Minimal Assistance - Patient > 75% (slide board.) ?  ?  ?Locomotion ?Ambulation ? ? ?Ambulation assist ? ? Ambulation activity did not occur: Safety/medical concerns ? ?Assist level: 2 helpers ?Assistive device: Other (comment) (rail in hall) ?Max distance: 15  ? ?Walk 10 feet  activity ? ? ?Assist ?   ? ?Assist level: 2 helpers ?Assistive device: Other (comment)  ? ?Walk 50 feet activity ? ? ?Assist Walk 50 feet with 2 turns activity did not occur: Safety/medical concerns ? ?  ?   ? ? ?Walk 150 feet activity ? ? ?Assist Walk 150 feet activity did not occur: Safety/medical concerns ? ?  ?  ?  ? ?Walk 10 feet on uneven surface  ?activity ? ? ?Assist Walk 10 feet on uneven surfaces activity did not occur: Safety/medical concerns ? ? ?  ?   ? ?Wheelchair ? ? ? ? ?Assist Is the patient using a wheelchair?: Yes ?Type  of Wheelchair: Manual ?  ? ?Wheelchair assist level: Dependent - Patient 0% ?Max wheelchair distance: 150  ? ? ?Wheelchair 50 feet with 2 turns activity ? ? ? ?Assist ? ?  ?  ? ? ?Assist Level: Dependent - Patient 0%  ? ?Wheelchair 150 feet activity  ? ? ? ?Assist ?   ? ? ?Assist Level: Dependent - Patient 0%  ? ?Blood pressure 90/78, pulse (!) 59, temperature 99.4 ?F (37.4 ?C), temperature source Oral, resp. rate 17, height 5\' 4"  (1.626 m), weight 67.1 kg, SpO2 99 %. ? ?Medical Problem List and Plan: ?1. 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         - Patient may shower ?         - ELOS/Goals: Supervision to min A 11/27/2021 ?- Continue CIR PT, LT ? ?2.  Antithrombotics: ?-DVT/anticoagulation: Pharmaceutical: Lovenox ?            -antiplatelet therapy: Aspirin 81 mg daily and Plavix 71 mg daily x3 months then aspirin alone. ?3. Pain Management: Tylenol as needed,  ?Right hip pain, initially lateral hip , then prox anterior hip , now adductor pain, mild 2-4/10 per nursing documentation  ?4. Mood: Provide emotional support ?- Antipsychotic agents: N/A ?5. Neuropsych: This patient is capable of making decisions on his own behalf. ?6. Skin/Wound Care: Routine skin checks ?7. Fluids/Electrolytes/Nutrition: Routine in and outs with follow-up chemistries ?8.  Hypertension.  4/15 continue lisinopril 5 mg daily, 5 mg Norvasc daily.  Monitor with increased mobility. ?4/22 Continue lisinopril 5 mg daily, 5 mg Norvasc daily.  ?4/23 Noticed some "soft" b/p's today.  Per Dr. 5/23 stop Norvasc, push po fluids, and hold 5 mg lisinopril for SBP < 100. Communicate plan to nursing staff. ? ? ?  11/19/2021  ?  4:27 AM 11/18/2021  ?  9:52 PM 11/18/2021  ?  1:19 PM  ?Vitals with BMI  ?Systolic 90 100 104  ?Diastolic 78 59 67  ?Pulse 59 58 57  ? BP soft, flomax recently started , off  norvasc, will reduce lisinopril to 2.5mg  qd , check BMET given low po fluid intake  ?9.  Hyperlipidemia continue Lipitor tablet 40 mg at bedtime daily. ?10.  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  Follow-up as outpatient. ?11.  Obesity.  BMI 27.09.  Dietary follow-up ?12.  Urinary hesitation-PVRS/bladder scans to make sure patient's not retaining.  Had mildly elevated BCs urine culture reviewed showed no growth WBCs now are within normal limits. ?Recheck UA, low grade temp this am  ?13.  Insomnia: Continue melatonin 3 mg HS ?14.  Bradycardia: mild asymptomatic , EKG on 4/5 sinus brady arate 59 bpm, no other abnormalities ? ? ?  ?  LOS: ?18 days ?A FACE TO FACE EVALUATION WAS PERFORMED ? ?Erick ColaceAndrew E Tallie Dodds, MD ?11/19/2021, 9:50 AM  ? ? ? ?

## 2021-11-19 NOTE — Plan of Care (Signed)
?  Problem: Consults ?Goal: RH STROKE PATIENT EDUCATION ?Description: See Patient Education module for education specifics  ?Outcome: Progressing ?  ?Problem: RH SKIN INTEGRITY ?Goal: RH STG MAINTAIN SKIN INTEGRITY WITH ASSISTANCE ?Description: STG Maintain Skin Integrity With Min Assistance. ?Outcome: Progressing ?  ?Problem: RH SAFETY ?Goal: RH STG ADHERE TO SAFETY PRECAUTIONS W/ASSISTANCE/DEVICE ?Description: STG Adhere to Safety Precautions With Cues and Reminder. ?Outcome: Progressing ?Goal: RH STG DECREASED RISK OF FALL WITH ASSISTANCE ?Description: STG Decreased Risk of Fall With Min Assistance. ?Outcome: Progressing ?  ?Problem: RH COGNITION-NURSING ?Goal: RH STG USES MEMORY AIDS/STRATEGIES W/ASSIST TO PROBLEM SOLVE ?Description: STG Uses Memory Aids/Strategies With Min Assistance to Problem Solve. ?Outcome: Progressing ?Goal: RH STG ANTICIPATES NEEDS/CALLS FOR ASSIST W/ASSIST/CUES ?Description: STG Anticipates Needs/Calls for Assist With Cues and Reminders. ?Outcome: Progressing ?  ?Problem: RH KNOWLEDGE DEFICIT ?Goal: RH STG INCREASE KNOWLEDGE OF HYPERTENSION ?Description: Patient will demonstrate knowledge of HTN medications, dietary recommendations, and blood pressure parameters with educational materials and handouts provided by staff independently at discharge. ? ?Outcome: Progressing ?Goal: RH STG INCREASE KNOWLEGDE OF HYPERLIPIDEMIA ?Description: Patient will demonstrate knowledge of HLD medications, dietary recommendations, and lab values with educational materials and handouts provided by staff independently at discharge. ? ?Outcome: Progressing ?Goal: RH STG INCREASE KNOWLEDGE OF STROKE PROPHYLAXIS ?Description: Patient will demonstrate knowledge of medications used to prevent future strokes with educational materials and handouts provided by staff independently at discharge. ? ?Outcome: Progressing ?  ?Problem: RH Vision ?Goal: RH LTG Vision (Specify) ?Outcome: Progressing ?  ?

## 2021-11-19 NOTE — Progress Notes (Addendum)
Patient ID: Douglas Pruitt, male   DOB: 08-May-1951, 71 y.o.   MRN: [id number] ? ?Patient [facility] Referral sent to [facility] ?-Declined due to service area ? ?HH referral sent to [facility] ?-Declined ? ?[facility] Referral sent to [facility] ?-Declined ? ?[facility] Referral sent to [facility] ?-Patient approved for [facility] services   ?

## 2021-11-20 NOTE — Progress Notes (Signed)
Patient more alert this afternoon, no c/o pain. Patient currently resting in bed with call bell within reach. Daughter at bedside before visiting hours ended. Daughter state concerns with possible need for more assistance when eating. She states "He is going home next week but I don't know if he is ready yet". Have continued with plan of care. ?

## 2021-11-20 NOTE — Progress Notes (Addendum)
Occupational Therapy Session Note ? ?Patient Details  ?Name: Douglas Pruitt ?MRN: [id number] ?Date of Birth: 01-16-1951 ? ?Today's Date: 11/20/2021 ?OT Individual Time: 8413-2440 ?OT Individual Time Calculation (min): 54 min  ? ? ?Short Term Goals: ?Week 1:  OT Short Term Goal 1 (Week 1): Pt will complete sit to stand at sink with mod A. ?OT Short Term Goal 1 - Progress (Week 1): Met ?OT Short Term Goal 2 (Week 1): Pt will don shirt with min A. ?OT Short Term Goal 2 - Progress (Week 1): Met ?OT Short Term Goal 3 (Week 1): Pt will complete toilet transfer with max A and LRAD. ?OT Short Term Goal 3 - Progress (Week 1): Not met ?OT Short Term Goal 4 (Week 1): Pt will therapeutic position LUE in chair/bed with no more than min VCs. ?OT Short Term Goal 4 - Progress (Week 1): Not met ?Week 2:  OT Short Term Goal 1 (Week 2): Pt will complete toilet transfer with max A and LRAD. ?OT Short Term Goal 1 - Progress (Week 2): Met ?OT Short Term Goal 2 (Week 2): Pt will therapeutic position LUE in chair/bed with no more than min VCs. ?OT Short Term Goal 2 - Progress (Week 2): Not met ?OT Short Term Goal 3 (Week 2): Pt will don shirt with S. ?OT Short Term Goal 3 - Progress (Week 2): Not met ?OT Short Term Goal 4 (Week 2): Pt will don pants with mod A. ?OT Short Term Goal 4 - Progress (Week 2): Not met ?Week 3:  OT Short Term Goal 1 (Week 3): Pt will complete standing grooming task with mod A. ?OT Short Term Goal 2 (Week 3): Pt will pull down pants during toileting with mod A for balance. ?OT Short Term Goal 3 (Week 3): Pt will complete squat-pivot transfer to droparm 3in1 with mod A. ?OT Short Term Goal 4 (Week 3): Pt will verbalize >2 safe positioning strategies for LUE. ? ?Skilled Therapeutic Interventions/Progress Updates:  ?  Pt received semi-reclined in bed with interpreter present, reports poor sleep but, agreeable to therapy. Session focus on self-care retraining, activity tolerance, LUE NMR in prep for improved ADL/IADL/func  mobility performance + decreased caregiver burden. ? ?Came to sitting EOB with max A to progress BLE off bed and to lift trunk. Stedy transfer with min A to stabilize LLE > TTB in shower. Mod A to assist safely pivoting BLE to his L. Did wash hair and bathe R side of body with S, but due to being too cold shower ended quickly.  ? ?Donned shirt with min A to thread LUE. Max A to thread LLE/pull up over B hips with donning hips. Utilized bed rail on his R for sit to stand with heavy mod A to facilitate upright posture. ? ?Discussed DME needs and need for family training. ? ?Therapist applied Saebo Stim One NMES to the L shoulder (anterior/posterior delt)  for treatment of subluxation set at the below settings:   ?  ?Saebo Stim One ?Intensity: 8 clicks ?Duration: 60 min ?330 pulse width ?35 Hz pulse rate ?On 8 sec/ off 8 sec ?Ramp up/ down 2 sec ?Symmetrical Biphasic wave form ?Max intensity at 500 Ohm load ? ?Skin intact. Pt later reports pain post application and settings too strong per interpreter. ? ? ?Pt left seated in w/c with interpreter present and safety belt alarm engaged, call bell in reach, and all immediate needs met.  ? ? ?Therapy Documentation ?Precautions:  ?Precautions ?Precautions: Fall ?  Precaution Comments: L eye blind, L hemiplegia ?Restrictions ?Weight Bearing Restrictions: No ? ?Pain: ?  No c/o throughout ?ADL: See Care Tool for more details. ?  ? ?Therapy/Group: Individual Therapy ? ?Claudie Revering MS, OTR/L ? ?11/20/2021, 6:58 AM ?

## 2021-11-20 NOTE — Progress Notes (Signed)
Speech Language Pathology Daily Session Note ? ?Patient Details  ?Name: Douglas Pruitt ?MRN: [id number] ?Date of Birth: September 29, 1950 ? ?Today's Date: 11/20/2021 ?SLP Individual Time: 0903-1000 ?SLP Individual Time Calculation (min): 57 min ? ?Short Term Goals: ?Week 3: SLP Short Term Goal 1 (Week 3): Patient will consume current diet without overt s/s of aspiration with use of swallowing compensatory strategies at mod I level ?SLP Short Term Goal 2 (Week 3): Patient will scan to left field of enviornment during functional tasks with min-to-mod A verbal and visual cues. ?SLP Short Term Goal 3 (Week 3): Patient will demonstrate functional problem solving for functional and familiar tasks with Min-to-mod A verbal cues. ?SLP Short Term Goal 4 (Week 3): [name] will recall new, daily information with min-to-mod A verbal cues. ? ?Skilled Therapeutic Interventions: Skilled ST treatment focused on cognitive and swallowing goals. Pt agreeable to dys 3 trials with soft cake from home. Pt consumed with mildly prolonged mastication, mild oral residuals, and sup A verbal cues to monitor mild spillage on L. Recommend patient advance to dysphagia 3 textures which is likely baseline diet 2/2 pt report and missing dentition. Pt verbalized understanding and agreement. SLP facilitated following directions and visual scanning through matching cards by color with max fading to mod A verbal/visual cues to scan left of midline. Pt required intermittent cues to keep cards face-up instead of face-down with decreased carry over of this correction throughout task. Pt was transferred to bed at end of session using Stedy with min A. Applied itch cream to back per pt request. Patient was left in bed with alarm activated and immediate needs within reach at end of session. Continue per current plan of care.   ?   ?Pain ?  ? ?Therapy/Group: Individual Therapy ? ?Jerrilyn Messinger T Kaylani Fromme ?11/20/2021, 12:49 PM ?

## 2021-11-20 NOTE — Plan of Care (Signed)
?Problem: RH Balance ?Goal: LTG Patient will maintain dynamic standing with ADLs (OT) ?Description: LTG:  Patient will maintain dynamic standing balance with assist during activities of daily living (OT)  ?Outcome: Not Applicable ?Note: Anticipate pt will complete ADL seated or in static standing upon DC. ?  ?Problem: Sit to Stand ?Goal: LTG:  Patient will perform sit to stand in prep for activites of daily living with assistance level (OT) ?Description: LTG:  Patient will perform sit to stand in prep for activites of daily living with assistance level (OT) ?Flowsheets (Taken 11/20/2021 1422) ?LTG: PT will perform sit to stand in prep for activites of daily living with assistance level: Minimal Assistance - Patient > 75% ?Note: Downgraded due to slower than anticipated progress. ?  ?Problem: RH Grooming ?Goal: LTG Patient will perform grooming w/assist,cues/equip (OT) ?Description: LTG: Patient will perform grooming with assist, with/without cues using equipment (OT) ?Flowsheets (Taken 11/20/2021 1422) ?LTG: Pt will perform grooming with assistance level of: Supervision/Verbal cueing ?Note: Downgraded due to slower than anticipated progress. ?  ?Problem: RH Bathing ?Goal: LTG Patient will bathe all body parts with assist levels (OT) ?Description: LTG: Patient will bathe all body parts with assist levels (OT) ?Flowsheets (Taken 11/20/2021 1422) ?LTG: Pt will perform bathing with assistance level/cueing: Moderate Assistance - Patient 50 - 74% ?Note: Downgraded due to slower than anticipated progress. ?  ?Problem: RH Dressing ?Goal: LTG Patient will perform upper body dressing (OT) ?Description: LTG Patient will perform upper body dressing with assist, with/without cues (OT). ?Flowsheets (Taken 11/20/2021 1422) ?LTG: Pt will perform upper body dressing with assistance level of: Minimal Assistance - Patient > 75% ?Note: Downgraded due to slower than anticipated progress. ?Goal: LTG Patient will perform lower body dressing  w/assist (OT) ?Description: LTG: Patient will perform lower body dressing with assist, with/without cues in positioning using equipment (OT) ?Flowsheets (Taken 11/20/2021 1422) ?LTG: Pt will perform lower body dressing with assistance level of: Moderate Assistance - Patient 50 - 74% ?Note: Downgraded due to slower than anticipated progress. ?  ?Problem: RH Toileting ?Goal: LTG Patient will perform toileting task (3/3 steps) with assistance level (OT) ?Description: LTG: Patient will perform toileting task (3/3 steps) with assistance level (OT)  ?Flowsheets (Taken 11/20/2021 1422) ?LTG: Pt will perform toileting task (3/3 steps) with assistance level: Moderate Assistance - Patient 50 - 74% ?Note: Downgraded due to slower than anticipated progress. ?  ?Problem: RH Functional Use of Upper Extremity ?Goal: LTG Patient will use RT/LT upper extremity as a (OT) ?Description: LTG: Patient will use right/left upper extremity as a stabilizer/gross assist/diminished/nondominant/dominant level with assist, with/without cues during functional activity (OT) ?Flowsheets (Taken 11/20/2021 1422) ?LTG: Pt will use upper extremity in functional activity with assistance level of: Maximal Assistance - Patient 25 - 49% ?Note: Downgraded due to slower than anticipated progress. ?  ?Problem: RH Toilet Transfers ?Goal: LTG Patient will perform toilet transfers w/assist (OT) ?Description: LTG: Patient will perform toilet transfers with assist, with/without cues using equipment (OT) ?Flowsheets (Taken 11/20/2021 1422) ?LTG: Pt will perform toilet transfers with assistance level of: Minimal Assistance - Patient > 75% ?Note: Downgraded due to slower than anticipated progress. ?  ?Problem: RH Tub/Shower Transfers ?Goal: LTG Patient will perform tub/shower transfers w/assist (OT) ?Description: LTG: Patient will perform tub/shower transfers with assist, with/without cues using equipment (OT) ?Flowsheets (Taken 11/20/2021 1422) ?LTG: Pt will perform  tub/shower stall transfers with assistance level of: Minimal Assistance - Patient > 75% ?Note: Downgraded due to slower than anticipated progress. ?  ?

## 2021-11-20 NOTE — Progress Notes (Addendum)
Patient ID: Douglas Pruitt, male   DOB: 12-20-1950, 71 y.o.   MRN: [id number] ? ?Hospital Bed, Drop Arm Commode, Transfer Board, and L half lap tray ordered through [employer]. ?

## 2021-11-20 NOTE — Progress Notes (Signed)
?                                                       PROGRESS NOTE ? ? ?Subjective/Complaints: ? ?Seen in SLP  interpreter present , swallowing ok  ?No pain at rest  ?ROS: Negatives CP, SOB, N/V/D, no bowel or bladder issues  ? ? ?Objective: ?  ?No results found. ?Recent Labs  ?  11/19/21 ?1013  ?WBC 11.3*  ?HGB 10.5*  ?HCT 30.4*  ?PLT 307  ? ?Recent Labs  ?  11/19/21 ?1013  ?NA 131*  ?K 3.6  ?CL 94*  ?CO2 28  ?GLUCOSE 120*  ?BUN 11  ?CREATININE 0.70  ?CALCIUM 8.8*  ? ? ? ?Intake/Output Summary (Last 24 hours) at 11/20/2021 0925 ?Last data filed at 11/20/2021 0700 ?Gross per 24 hour  ?Intake 40 ml  ?Output 501 ml  ?Net -461 ml  ? ?  ? ?  ? ?Physical Exam: ? ? ?General: No acute distress ?Mood and affect are appropriate ?Heart: Regular rate and rhythm no rubs murmurs or extra sounds ?Lungs: Clear to auscultation, breathing unlabored, no rales or wheezes ?Abdomen: Positive bowel sounds, soft nontender to palpation, nondistended ?Extremities: No clubbing, cyanosis, or edema ?Skin: No evidence of breakdown, no evidence of rash ?Neurologic: Cranial nerves II through XII intact, motor strength is 5/5 in right and 0/5 left deltoid, bicep, tricep, grip, hip flexor, knee extensors, ankle dorsiflexor and plantar flexor ? ?Musculoskeletal: Full range of motion in all 4 extremities. Mild tenderness Left hip add  ? ? ?Vital Signs ?Blood pressure 126/87, pulse (!) 56, temperature 98.5 ?F (36.9 ?C), resp. rate 18, height 5\' 4"  (1.626 m), weight 67.1 kg, SpO2 96 %. ? ? ? ?Assessment/Plan: ?1. Functional deficits which require 3+ hours per day of interdisciplinary therapy in a comprehensive inpatient rehab setting. ?Physiatrist is providing close team supervision and 24 hour management of active medical problems listed below. ?Physiatrist and rehab team continue to assess barriers to discharge/monitor patient progress toward functional and medical goals ? ?Care Tool: ? ?Bathing ? Bathing activity did not occur: Safety/medical  concerns ?Body parts bathed by patient: Left arm, Chest, Abdomen, Front perineal area, Right upper leg, Left upper leg, Face, Right lower leg  ? Body parts bathed by helper: Left lower leg, Buttocks ?  ?  ?Bathing assist Assist Level: Moderate Assistance - Patient 50 - 74% ?  ?  ?Upper Body Dressing/Undressing ?Upper body dressing Upper body dressing/undressing activity did not occur (including orthotics): Safety/medical concerns ?What is the patient wearing?: Pull over shirt ?   ?Upper body assist Assist Level: Maximal Assistance - Patient 25 - 49% (overt cueing to attend to left arm, patient perseverative.) ?   ?Lower Body Dressing/Undressing ?Lower body dressing ? ? ? Lower body dressing activity did not occur: Safety/medical concerns ?What is the patient wearing?: Pants ? ?  ? ?Lower body assist Assist for lower body dressing: Maximal Assistance - Patient 25 - 49% ?   ? ?Toileting ?Toileting Toileting Activity did not occur ( and hygiene only): N/A (no void or bm)  ?Toileting assist Assist for toileting: Set up assist (urinal) ?  ?  ?Transfers ?Chair/bed transfer ? ?Transfers assist ? Chair/bed transfer activity did not occur: Safety/medical concerns ? ?Chair/bed transfer assist level: Moderate Assistance - Patient 50 - 74% ?  ?  ?  Locomotion ?Ambulation ? ? ?Ambulation assist ? ? Ambulation activity did not occur: Safety/medical concerns ? ?Assist level: 2 helpers ?Assistive device: Other (comment) (rail in hall) ?Max distance: 15  ? ?Walk 10 feet activity ? ? ?Assist ?   ? ?Assist level: 2 helpers ?Assistive device: Other (comment)  ? ?Walk 50 feet activity ? ? ?Assist Walk 50 feet with 2 turns activity did not occur: Safety/medical concerns ? ?  ?   ? ? ?Walk 150 feet activity ? ? ?Assist Walk 150 feet activity did not occur: Safety/medical concerns ? ?  ?  ?  ? ?Walk 10 feet on uneven surface  ?activity ? ? ?Assist Walk 10 feet on uneven surfaces activity did not occur: Safety/medical  concerns ? ? ?  ?   ? ?Wheelchair ? ? ? ? ?Assist Is the patient using a wheelchair?: Yes ?Type of Wheelchair: Manual ?  ? ?Wheelchair assist level: Dependent - Patient 0% ?Max wheelchair distance: 150  ? ? ?Wheelchair 50 feet with 2 turns activity ? ? ? ?Assist ? ?  ?  ? ? ?Assist Level: Dependent - Patient 0%  ? ?Wheelchair 150 feet activity  ? ? ? ?Assist ?   ? ? ?Assist Level: Dependent - Patient 0%  ? ?Blood pressure 126/87, pulse (!) 56, temperature 98.5 ?F (36.9 ?C), resp. rate 18, height 5\' 4"  (1.626 m), weight 67.1 kg, SpO2 96 %. ? ?Medical Problem List and Plan: ?1. 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         - Patient may shower ?         - ELOS/Goals: Supervision to min A 11/27/2021 ?- Continue CIR PT, SLP, team conf in am  ? ?2.  Antithrombotics: ?-DVT/anticoagulation: Pharmaceutical: Lovenox ?            -antiplatelet therapy: Aspirin 81 mg daily and Plavix 71 mg daily x3 months then aspirin alone. ?3. Pain Management: Tylenol as needed,  ?Right hip pain, initially lateral hip , then prox anterior hip , now adductor pain, mild 2-4/10 per nursing documentation  ?4. Mood: Provide emotional support ?- Antipsychotic agents: N/A ?5. Neuropsych: This patient is capable of making decisions on his own behalf. ?6. Skin/Wound Care: Routine skin checks ?7. Fluids/Electrolytes/Nutrition: Routine in and outs with follow-up chemistries ?8.  Hypertension.  4/15 continue lisinopril 5 mg daily, 5 mg Norvasc daily.  Monitor with increased mobility. ?4/22 Continue lisinopril 5 mg daily, 5 mg Norvasc daily.  ?4/23 Noticed some "soft" b/p's today.  Per Dr. Berline ChoughLovorn stop Norvasc, push po fluids, and hold 5 mg lisinopril for SBP < 100. Communicate plan to nursing staff. ? ? ?  11/20/2021  ?  7:39 AM 11/20/2021  ?  3:27 AM 11/19/2021  ?  8:20 PM  ?Vitals with BMI  ?Systolic  126 133 137  ?Diastolic 87 63 69  ?Pulse 56 68 64  ? BP soft, flomax recently started , off norvasc, will reduce lisinopril to 2.5mg  qd , Improving 4/25, BMET given low po fluid intake  ?9.  Hyperlipidemia continue Lipitor tablet 40 mg at bedtime daily. ?10.  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  Follow-up as outpatient. ?11.  Obesity.  BMI 27.09.  Dietary follow-up ?12.  Urinary hesitation-PVRS/bladder scans to make sure patient's not retaining.  Had mildly elevated BCs urine culture reviewed showed no growth WBCs now  are within normal limits. ?Had a low grade temp x 1 with mild leukocytosis Recheck UA, essentially neg  ?13.  Insomnia: Continue melatonin 3 mg HS ?14.  Bradycardia: mild asymptomatic , EKG on 4/5 sinus brady arate 59 bpm, no other abnormalities ? ? ?  ?LOS: ?19 days ?A FACE TO FACE EVALUATION WAS PERFORMED ? ?Erick Colace, MD ?11/20/2021, 9:25 AM  ? ? ? ?

## 2021-11-21 MED ORDER — SORBITOL 70 % SOLN: 60.0000 mL | Freq: Every day | Status: DC | PRN

## 2021-11-21 MED ORDER — DICLOFENAC SODIUM 1 % EX GEL
2.0000 g | Freq: Four times a day (QID) | CUTANEOUS | Status: DC
  Administered 2021-11-21 – 2021-11-28 (×8): 2 g via TOPICAL
  Filled 2021-11-21: qty 100

## 2021-11-21 NOTE — Progress Notes (Signed)
?                                                       PROGRESS NOTE ? ? ?Subjective/Complaints: ? ?Pt c/o bilateral shoulder burning after the muscle rub was applied, no falls or trauma, discussed with RN as well as SLP who clarified c/o using interpreter  ?ROS: Negative CP, SOB, N/V/D, no bowel or bladder issues  ? ? ?Objective: ?  ?No results found. ?Recent Labs  ?  11/19/21 ?1013  ?WBC 11.3*  ?HGB 10.5*  ?HCT 30.4*  ?PLT 307  ? ? ?Recent Labs  ?  11/19/21 ?1013  ?NA 131*  ?K 3.6  ?CL 94*  ?CO2 28  ?GLUCOSE 120*  ?BUN 11  ?CREATININE 0.70  ?CALCIUM 8.8*  ? ? ? ? ?Intake/Output Summary (Last 24 hours) at 11/21/2021 0908 ?Last data filed at 11/20/2021 1900 ?Gross per 24 hour  ?Intake 195 ml  ?Output 205 ml  ?Net -10 ml  ? ?  ? ?  ? ?Physical Exam: ? ? ?General: No acute distress ?Mood and affect are appropriate ?Heart: Regular rate and rhythm no rubs murmurs or extra sounds ?Lungs: Clear to auscultation, breathing unlabored, no rales or wheezes ?Abdomen: Positive bowel sounds, soft nontender to palpation, nondistended ?Extremities: No clubbing, cyanosis, or edema ?Skin: No evidence of breakdown, no evidence of rash ?Neurologic: Cranial nerves II through XII intact, motor strength is 5/5 in right and 0/5 left deltoid, bicep, tricep, grip, hip flexor, knee extensors, ankle dorsiflexor and plantar flexor ? ?Musculoskeletal: Full range of motion in all 4 extremities. Left shoulder subluxation  ?No pain with shoulder ROM ? ?Vital Signs ?Blood pressure 127/72, pulse (!) 51, temperature 98 ?F (36.7 ?C), resp. rate 17, height 5' 4"  (1.626 m), weight 67.1 kg, SpO2 99 %. ? ? ? ?Assessment/Plan: ?1. Functional deficits which require 3+ hours per day of interdisciplinary therapy in a comprehensive inpatient rehab setting. ?Physiatrist is providing close team supervision and 24 hour management of active medical problems listed below. ?Physiatrist and rehab team continue to assess barriers to discharge/monitor patient progress  toward functional and medical goals ? ?Care Tool: ? ?Bathing ? Bathing activity did not occur: Safety/medical concerns ?Body parts bathed by patient: Left arm, Chest, Abdomen, Front perineal area, Right upper leg, Left upper leg, Face, Right lower leg  ? Body parts bathed by helper: Left lower leg, Buttocks ?  ?  ?Bathing assist Assist Level: Moderate Assistance - Patient 50 - 74% ?  ?  ?Upper Body Dressing/Undressing ?Upper body dressing Upper body dressing/undressing activity did not occur (including orthotics): Safety/medical concerns ?What is the patient wearing?: Pull over shirt ?   ?Upper body assist Assist Level: Maximal Assistance - Patient 25 - 49% (overt cueing to attend to left arm, patient perseverative.) ?   ?Lower Body Dressing/Undressing ?Lower body dressing ? ? ? Lower body dressing activity did not occur: Safety/medical concerns ?What is the patient wearing?: Pants ? ?  ? ?Lower body assist Assist for lower body dressing: Maximal Assistance - Patient 25 - 49% ?   ? ?Toileting ?Toileting Toileting Activity did not occur (Probation officer and hygiene only): N/A (no void or bm)  ?Toileting assist Assist for toileting: Set up assist (urinal) ?  ?  ?Transfers ?Chair/bed transfer ? ?Transfers assist ? Chair/bed transfer activity did  not occur: Safety/medical concerns ? ?Chair/bed transfer assist level: Moderate Assistance - Patient 50 - 74% ?  ?  ?Locomotion ?Ambulation ? ? ?Ambulation assist ? ? Ambulation activity did not occur: Safety/medical concerns ? ?Assist level: 2 helpers ?Assistive device: Other (comment) (rail in hall) ?Max distance: 15  ? ?Walk 10 feet activity ? ? ?Assist ?   ? ?Assist level: 2 helpers ?Assistive device: Other (comment)  ? ?Walk 50 feet activity ? ? ?Assist Walk 50 feet with 2 turns activity did not occur: Safety/medical concerns ? ?  ?   ? ? ?Walk 150 feet activity ? ? ?Assist Walk 150 feet activity did not occur: Safety/medical concerns ? ?  ?  ?  ? ?Walk 10 feet on  uneven surface  ?activity ? ? ?Assist Walk 10 feet on uneven surfaces activity did not occur: Safety/medical concerns ? ? ?  ?   ? ?Wheelchair ? ? ? ? ?Assist Is the patient using a wheelchair?: Yes ?Type of Wheelchair: Manual ?  ? ?Wheelchair assist level: Dependent - Patient 0% ?Max wheelchair distance: 150  ? ? ?Wheelchair 50 feet with 2 turns activity ? ? ? ?Assist ? ?  ?  ? ? ?Assist Level: Dependent - Patient 0%  ? ?Wheelchair 150 feet activity  ? ? ? ?Assist ?   ? ? ?Assist Level: Dependent - Patient 0%  ? ?Blood pressure 127/72, pulse (!) 51, temperature 98 ?F (36.7 ?C), resp. rate 17, height 5' 4"  (1.626 m), weight 67.1 kg, SpO2 99 %. ? ?Medical Problem List and Plan: ?1. 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         - Patient may shower ?         - ELOS/Goals: Supervision to min A 11/27/2021 ?- Continue CIR PT, SLP, Team conference today please see physician documentation under team conference tab, met with team  to discuss problems,progress, and goals. Formulized individual treatment plan based on medical history, underlying problem and comorbidities. ? ? ?2.  Antithrombotics: ?-DVT/anticoagulation: Pharmaceutical: Lovenox ?            -antiplatelet therapy: Aspirin 81 mg daily and Plavix 71 mg daily x3 months then aspirin alone. ?3. Pain Management: Tylenol as needed,  ?Pt on low dose prn tramadol which pt appears to tolerate without drowsiness, per family request try acetaminophen first ?Off gabapentin due to drowsiness ?D/c muscle rub due to irritation, will trial voltaren gel ?4. Mood: Provide emotional support ?- Antipsychotic agents: N/A ?5. Neuropsych: This patient is capable of making decisions on his own behalf. ?6. Skin/Wound Care: Routine skin checks ?7. Fluids/Electrolytes/Nutrition: Routine in and outs with follow-up chemistries ?8.   Hypertension.   ? ? ?  11/21/2021  ?  4:45 AM 11/20/2021  ?  7:53 PM 11/20/2021  ?  1:03 PM  ?Vitals with BMI  ?Systolic 545 625 638  ?Diastolic 72 37 65  ?Pulse 51 58 59  ? BP soft, flomax recently started , off norvasc, will reduce lisinopril to 2.51m qd , Improving 4/25, BMET given low po fluid intake  ?9.  Hyperlipidemia continue Lipitor tablet 40 mg at bedtime daily. ?10.  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  Follow-up as outpatient. ?11.  Obesity.  BMI 27.09.  Dietary follow-up ?12.  Urinary hesitation-PVRS/bladder scans to make sure patient's not retaining.  Had mildly elevated BCs urine  culture reviewed showed no growth WBCs now are within normal limits. ?Had a low grade temp x 1 with mild leukocytosis Recheck UA, essentially neg  ?13.  Insomnia: Continue melatonin 3 mg HS ?14.  Bradycardia: mild asymptomatic , EKG on 4/5 sinus brady arate 59 bpm, no other abnormalities ?Not on beta blocker  ? ?  ?LOS: ?20 days ?A FACE TO FACE EVALUATION WAS PERFORMED ? ?Charlett Blake, MD ?11/21/2021, 9:08 AM  ? ? ? ?

## 2021-11-21 NOTE — Plan of Care (Signed)
?  Problem: RH Balance ?Goal: LTG Patient will maintain dynamic standing balance (PT) ?Description: LTG:  Patient will maintain dynamic standing balance with assistance during mobility activities (PT) ?Flowsheets (Taken 11/21/2021 1028) ?LTG: Pt will maintain dynamic standing balance during mobility activities with:: Moderate Assistance - Patient 50 - 74% ?  ?Problem: RH Bed Mobility ?Goal: LTG Patient will perform bed mobility with assist (PT) ?Description: LTG: Patient will perform bed mobility with assistance, with/without cues (PT). ?Flowsheets (Taken 11/21/2021 1028) ?LTG: Pt will perform bed mobility with assistance level of: Contact Guard/Touching assist ?  ?Problem: RH Bed to Chair Transfers ?Goal: LTG Patient will perform bed/chair transfers w/assist (PT) ?Description: LTG: Patient will perform bed to chair transfers with assistance (PT). ?Flowsheets (Taken 11/21/2021 1028) ?LTG: Pt will perform Bed to Chair Transfers with assistance level: Minimal Assistance - Patient > 75% ?Note: Downgraded due to slow progress ?  ?Problem: RH Car Transfers ?Goal: LTG Patient will perform car transfers with assist (PT) ?Description: LTG: Patient will perform car transfers with assistance (PT). ?Flowsheets (Taken 11/21/2021 1028) ?LTG: Pt will perform car transfers with assist:: Moderate Assistance - Patient 50 - 74% ?  ?Problem: RH Ambulation ?Goal: LTG Patient will ambulate in controlled environment (PT) ?Description: LTG: Patient will ambulate in a controlled environment, # of feet with assistance (PT). ?Flowsheets (Taken 11/21/2021 1028) ?LTG: Pt will ambulate in controlled environ  assist needed:: Maximal Assistance - Patient 25 - 49% ?LTG: Ambulation distance in controlled environment: 61ft with LRAD ?Note: Downgraded due to slow progress ?Goal: LTG Patient will ambulate in home environment (PT) ?Description: LTG: Patient will ambulate in home environment, # of feet with assistance (PT). ?Flowsheets (Taken 11/21/2021  1028) ?LTG: Pt will ambulate in home environ  assist needed:: Maximal Assistance - Patient 25 - 49% ?LTG: Ambulation distance in home environment: 35ft with therapy only and LRAD ?Note: Downgraded due to slow progress ?  ?

## 2021-11-21 NOTE — Progress Notes (Signed)
Physical Therapy Session Note ? ?Patient Details  ?Name: Douglas Pruitt ?MRN: [id number] ?Date of Birth: 07-04-1951 ? ?Today's Date: 11/20/2021 ?PT Individual Time:  1405-1500  ?PT Individual Time Calculation (min): 55 min ? ?Short Term Goals: ?Week 2:  PT Short Term Goal 1 (Week 2): Pt will transfer to and from [facility] with min assist ?PT Short Term Goal 2 (Week 2): Pt will ambulate 26ft with max assist of 1. ?PT Short Term Goal 3 (Week 2): Pt will ascend 4 steps wth max assist with 1 UE support ?PT Short Term Goal 4 (Week 2): Pt will perform bed mobility with consistent MinA. ? ? ?Skilled Therapeutic Interventions/Progress Updates:  ?Patient supine in bed on entrance to room. Patient alert and agreeable to PT session. In person interpreter present.  ? ?Patient with mild pain complaint at start of session. ? ?Therapeutic Activity: ?Bed Mobility: Patient performed supine --> sit with MinA and extensive cueing for using trunk to bring LLE off EOB without assist. Requires MinA for bringing LLE to bed surface in return to supine. ?Transfers: Patient performed squat pivot transfer bed to w/c and w/c to bed toward R side with CGA/ MinA. Sit<>stand  transfers performed at hallway handrail throughout session with CGA and MinA to block knee with AFO donned. Provided verbal cues for upright posturing. ? ?Gait Training:  ?Patient ambulated 55' x2 using RHR with MaxA for LLE advancement and positioning as well as block to L knee during L stance phase.  Demonstrated need for consistent vc/ tc for weight shift, appropriate contralateral step, upright posture, hip extension, and level gaze. ? ?Patient supine  in bed at end of session with brakes locked, bed alarm set, and all needs within reach. Requesting urinal and pt able to self place. RN notified as to pt's position with urinal.  ? ? ?Therapy Documentation ?Precautions:  ?Precautions ?Precautions: Fall ?Precaution Comments: L eye blind, L hemiplegia ?Restrictions ?Weight Bearing  Restrictions: No ?General: ?  ?Vital Signs: ?Therapy Vitals ?Temp: 98 ?F (36.7 ?C) ?Pulse Rate: (!) 51 ?Resp: 17 ?BP: 127/72 ?Patient Position (if appropriate): Lying ?Oxygen Therapy ?SpO2: 99 % ?O2 Device: Room Air ?Pain: ?No pain complaint during session. Pt relates L ant thigh pain at start of session with no numerical rating provided.  ? ?Therapy/Group: Individual Therapy ? ?Loel Dubonnet PT, DPT ?11/20/2021, 6:52 PM  ?

## 2021-11-21 NOTE — Progress Notes (Signed)
Physical Therapy Session Note ? ?Patient Details  ?Name: Douglas Pruitt ?MRN: [id number] ?Date of Birth: 11/13/1950 ? ?Today's Date: 11/21/2021 ?PT Individual Time: 7903-8333 ?PT Individual Time Calculation (min): 39 min  ? ?Short Term Goals: ? ?Week 2:  PT Short Term Goal 1 (Week 2): Pt will transfer to and from [facility] with min assist ?PT Short Term Goal 2 (Week 2): Pt will ambulate 45f with max assist of 1. ?PT Short Term Goal 3 (Week 2): Pt will ascend 4 steps wth max assist with 1 UE support ?PT Short Term Goal 4 (Week 2): Pt will perform bed mobility with consistent MinA. ? ? ?Skilled Therapeutic Interventions/Progress Updates: ] ? ?Pt received supine in bed and agreeable to PT. Supine>sit transfer with min assist at the LLE. Noted to have been incontinent of bowel supine in bed. Pt returned to supine with min assist. PT treatment focused on transfer, bed mobility, and hygiene. Steady transfer with set up assist to doff soiled pants. Min assist to sustain standing for PT to perform pericare. Pt reports additional toileting needs. [facility] Sitting balance in stedy x 2 min for bathroom set up. Once on toilet, pt unable to perform additional void. Sit<>stand with supervision assist and PT performed completion of pericare. Pt returned to bed and performed sit>supine with min assist of the LLE. Throughout session pt perseverative of "burning" sensation in shoulder. PT performed hygiene with soap and water to bil shoulders with no report of change in pain. Pt rleft supine in bed with call bell in reach and all needs met.  ?   ? ?   ? ?Therapy Documentation ?Precautions:  ?Precautions ?Precautions: Fall ?Precaution Comments: L eye blind, L hemiplegia ?Restrictions ?Weight Bearing Restrictions: No ?General: ?PT Amount of Missed Time (min): 21 Minutes ?PT Missed Treatment Reason: Pain;Patient fatigue ?Vital Signs: ?Therapy Vitals ?Temp: 98.1 ?F (36.7 ?C) ?Pulse Rate: (!) 52 ?Resp: 18 ?BP: 133/67 ?Patient  Position (if appropriate): Lying ?Oxygen Therapy ?SpO2: 100 % ?Pain: ?Pain Assessment ?Faces Pain Scale: Hurts a little bit ? ? ? ?Therapy/Group: Individual Therapy ? ?ALorie Phenix?11/21/2021, 3:34 PM  ?

## 2021-11-21 NOTE — Patient Care Conference (Signed)
Inpatient RehabilitationTeam Conference and Plan of Care Update ?Date: 11/21/2021   Time: 10:24 AM  ? ? ?Patient Name: Douglas Pruitt      ?Medical Record Number: [id number]  ?Date of Birth: [birth year] ?Sex: Male         ?Room/Bed: 5C07C/5C07C-01 ?Payor Info: Payor: MEDICARE / Plan: MEDICARE PART A AND B / Product Type: *No Product type* /   ? ?Admit Date/Time:  11/01/2021  5:14 PM ? ?Primary Diagnosis:  Right middle cerebral artery stroke (HCC) ? ?Hospital Problems: Principal Problem: ?  Right middle cerebral artery stroke (HCC) ? ? ? ?Expected Discharge Date: Expected Discharge Date: 11/27/21 ? ?Team Members Present: ?Physician leading conference: Dr. Claudette Laws ?Social Worker Present: Lavera Guise, BSW ?Nurse Present: Chana Bode, RN ?PT Present: Grier Rocher, PT ?OT Present: Annye English, OT ?SLP Present: Eilene Ghazi, SLP ?PPS Coordinator present : Fae Pippin, SLP ? ?   Current Status/Progress Goal Weekly Team Focus  ?Bowel/Bladder ? ? Continent to bowel and bladder, LBM 4/24  continent to bowel and bladder      ?Swallow/Nutrition/ Hydration ? ? Dys 3 diet, thin liquids; sup A for awareness of anterior spillage  mod I; plan to downgrade to sup A due to slow progress  tolerance of dys 3 textures, implementation of swallowing strategies and self monitoring of anterior spillage   ?ADL's ? ? max A LBD, toileting, mod LBB, squat-pivot bathroom transfers, min UBD/bathing seated at sink, S grooming seated at sink; LUE/hand brummstrom level II, slow progress, 24/7 S and fam ed needed  downgraded min A transfers squat-pivot, mod LBD/toileting, min UBD/B  LUE NMR, transfer/self-care/balance retraining, activity tolerance, pt/family/DME/AE education   ?Mobility ? ? Continued anterior thigh pain in LLE Bed mobility = MinA; Transfers = ModA for sit<>stand and stand pivot, MinA for squat pivot and heavy cueing, With pull-to stand pt is able to rise to stand with CGA.  Amb = has made it up to ~78ft using RHR in  hallway with MaxA Continues to demonstrate hip ER and knee flexion in any standing activity including amb; decreased safety awareness throughout.  Sitting/ Standing balance with SUP, Bed mobility with SUP, Transfers with SUP to MinA, Ambulation using LRAD with MinA -- may  need downgrading  Continued L hemibody NMR, standing balance, transfers, gait, stairs, education on impulsive behaviors, family education   ?Communication ? ? mod I for speech intelligiblity  mod I      ?Safety/Cognition/ Behavioral Observations ? mod A  mod A - goals downgraded due to slower than anticipated progress  L visual scanning, basic problem solving, intellectual awareness   ?Pain ? ? pain to left hip and left shoulder controlled on current regimen of schedule tramodol,however family reluctant to give due to drooziness. refused last 2 doses         ?Skin ? ? brusing to left hip skin otherwise intact  skin to remain intact      ? ? ?Discharge Planning:  ?Discharging back home with daughter able to provide 24/7 supervision. HH established   ?Team Discussion: ?Patient's pain is better but he is sensitive to medications; causing drowsiness and constipation. Slow progression overall with perseveration and trouble with hemi techniques. ? ?Patient on target to meet rehab goals: ?Currently needs max assist for lower body dressing and toileting and min assist for upper body care. Able to complete squat pivot with min - mod assist. Needs min  - mod assist for stand pivot or squat pivot and slide board transfers.  WB with left lower extremity with AFO however ambulation with therapy only; mod - max assist. ? ?*See Care Plan and progress notes for long and short-term goals.  ? ?Revisions to Treatment Plan:  ?Downgraded goals for PT and SLP ?AFO for left leg ?  ?Teaching Needs: ?Safety, secondary risk management, dietary modifications, medication, transfers, toileting, etc  ?Current Barriers to Discharge: ?Decreased caregiver support and Home  enviroment access/layout ? ?Possible Resolutions to Barriers: ?Family education ?24/7 supervision ?HH follow up services ?DME: DA-BSC, TTB, hospital bed, w/c with half lap tray ?  ? ? Medical Summary ?Current Status: Slow progress with ADLs, chronic constipation, Left hip pain improving ? Barriers to Discharge: Medical stability ?  ?  ? ? ?Continued Need for Acute Rehabilitation Level of Care: The patient requires daily medical management by a physician with specialized training in physical medicine and rehabilitation for the following reasons: ?Direction of a multidisciplinary physical rehabilitation program to maximize functional independence : Yes ?Medical management of patient stability for increased activity during participation in Imre intensive rehabilitation regime.: Yes ?Analysis of laboratory values and/or radiology reports with any subsequent need for medication adjustment and/or medical intervention. : Yes ? ? ?I attest that I was present, lead the team conference, and concur with the assessment and plan of the team. ? ? ?Chana Bode B ?11/21/2021, 2:44 PM  ? ? ? ? ? ? ?

## 2021-11-21 NOTE — Progress Notes (Signed)
Orthopedic Tech Progress Note ?Patient Details:  ?Douglas Pruitt ?February 21, 1951 ?[id number] ? ?Called in order to [facility] for [name] CONSULT  ? ?Patient ID: Douglas Pruitt, male   DOB: [birth year], 71 y.o.   MRN: [id number] ? ?[name] ?11/21/2021, 12:32 PM ? ?

## 2021-11-21 NOTE — Plan of Care (Signed)
?  Problem: RH Expression Communication ?Goal: LTG Patient will increase speech intelligibility (SLP) ?Description: LTG: Patient will increase speech intelligibility at word/phrase/conversation level with cues, % of the time (SLP) ?Outcome: Completed/Met ?  ?Problem: RH Memory ?Goal: LTG Patient will use memory compensatory aids to (SLP) ?Description: LTG:  Patient will use memory compensatory aids to recall biographical/new, daily complex information with cues (SLP) ?Outcome: Not Applicable ?  ?Problem: RH Swallowing ?Goal: LTG Patient will consume least restrictive diet using compensatory strategies with assistance (SLP) ?Description: LTG:  Patient will consume least restrictive diet using compensatory strategies with assistance (SLP) ?Flowsheets (Taken 11/21/2021 1027) ?LTG: Pt Patient will consume least restrictive diet using compensatory strategies with assistance of (SLP): Supervision ?Note: Goal downgraded due to slower than anticipated progress ?Goal: LTG Patient will participate in dysphagia therapy to increase swallow function with assistance (SLP) ?Description: LTG:  Patient will participate in dysphagia therapy to increase swallow function with assistance (SLP) ?Flowsheets (Taken 11/21/2021 1027) ?LTG: Pt will participate in dysphagia therapy to increase swallow function with assistance of (SLP): Supervision ?Note: Goal downgraded due to slower than anticipated progress ?  ?Problem: RH Problem Solving ?Goal: LTG Patient will demonstrate problem solving for (SLP) ?Description: LTG:  Patient will demonstrate problem solving for basic/complex daily situations with cues  (SLP) ?Flowsheets (Taken 11/21/2021 1027) ?LTG: Patient will demonstrate problem solving for (SLP): Basic daily situations ?LTG Patient will demonstrate problem solving for: Moderate Assistance - Patient 50 - 74% ?Note: Goal downgraded due to slower than anticipated progress ?  ?Problem: RH Awareness ?Goal: LTG: Patient will demonstrate  awareness during functional activites type of (SLP) ?Description: LTG: Patient will demonstrate awareness during functional activites type of (SLP) ?Flowsheets (Taken 11/21/2021 1035) ?Patient will demonstrate during cognitive/linguistic activities awareness type of: ? Intellectual ? Emergent ?LTG: Patient will demonstrate awareness during cognitive/linguistic activities with assistance of (SLP): Moderate Assistance - Patient 50 - 74% ?  ?

## 2021-11-21 NOTE — Progress Notes (Signed)
Patient ID: Douglas Pruitt, male   DOB: 07-25-1951, 71 y.o.   MRN: [id number] ? ?Team Conference Report to Patient/Family ? ?Team Conference discussion was reviewed with the patient and caregiver, including goals, any changes in plan of care and target discharge date.  Patient and caregiver express understanding and are in agreement.  The patient has a target discharge date of 11/27/21. ? ?SW received follow up from patient daughter. Sw provided conference updates  and scheduled family education. Daughter will be present for education on Friday and Saturday. Daughter has concerns about the patients vision and pain medications.  ? ?Andria Rhein ?11/21/2021, 2:53 PM  ?

## 2021-11-21 NOTE — Progress Notes (Signed)
Occupational Therapy Session Note ? ?Patient Details  ?Name: Douglas Pruitt ?MRN: [id number] ?Date of Birth: [birth year] ? ?Today's Date: 11/21/2021 ?OT Individual Time: 4098-1191 ?OT Individual Time Calculation (min): 50 min  ? ? ?Short Term Goals: ?Week 1:  OT Short Term Goal 1 (Week 1): Pt will complete sit to stand at sink with mod A. ?OT Short Term Goal 1 - Progress (Week 1): Met ?OT Short Term Goal 2 (Week 1): Pt will don shirt with min A. ?OT Short Term Goal 2 - Progress (Week 1): Met ?OT Short Term Goal 3 (Week 1): Pt will complete toilet transfer with max A and LRAD. ?OT Short Term Goal 3 - Progress (Week 1): Not met ?OT Short Term Goal 4 (Week 1): Pt will therapeutic position LUE in chair/bed with no more than min VCs. ?OT Short Term Goal 4 - Progress (Week 1): Not met ?Week 2:  OT Short Term Goal 1 (Week 2): Pt will complete toilet transfer with max A and LRAD. ?OT Short Term Goal 1 - Progress (Week 2): Met ?OT Short Term Goal 2 (Week 2): Pt will therapeutic position LUE in chair/bed with no more than min VCs. ?OT Short Term Goal 2 - Progress (Week 2): Not met ?OT Short Term Goal 3 (Week 2): Pt will don shirt with S. ?OT Short Term Goal 3 - Progress (Week 2): Not met ?OT Short Term Goal 4 (Week 2): Pt will don pants with mod A. ?OT Short Term Goal 4 - Progress (Week 2): Not met ?Week 3:  OT Short Term Goal 1 (Week 3): Pt will complete standing grooming task with mod A. ?OT Short Term Goal 2 (Week 3): Pt will pull down pants during toileting with mod A for balance. ?OT Short Term Goal 3 (Week 3): Pt will complete squat-pivot transfer to droparm 3in1 with mod A. ?OT Short Term Goal 4 (Week 3): Pt will verbalize >2 safe positioning strategies for LUE. ? ?Skilled Therapeutic Interventions/Progress Updates:  ?  Pt received semi-reclined in bed with interpreter present, agreeable to therapy. Session focus on self-care retraining, activity tolerance, transfer retraining in prep for improved ADL/IADL/func mobility  performance + decreased caregiver burden. ? ?Reports B shoulder "hot" and "burning." Trialled ice, cold wash cloth, and nursing earlier applied muscle rub gel to B shoulders with no success. Pt perseverative on burning throughout session and did not answer unrelated questions, although able to participate in other therac. Reports that shoulders began to hurt post saebo one application to L anterior/posterior deltoid in am, although did not report "hot" yesterday after removal. Appears to be confused that estim was still on shoulder, although difficult to fully assess due to language barrier. Will update team. ? ?Came to sitting EOB with max A to progress BLE off bed and to lift trunk. Donned shirt with min A to thread LUE. Donned pants with max A to thread LLE and to pull over B hips. Sit to stand with mod A to power up and for balance with HW. Total A to don B shoes/L AFO. Squat-pivot to his R with min A to fully pivot hips and assist for w/c part management.  ? ?Total A w/c transport to and from gym.  ? ?Stood x 8 with heavy min A for balance and lightly block L knee, appears to have improved activation in L quads and glutes this date and able to to remove velcro cards from vertical surface with heavy min A for balance and no UE support.  Max multimodal cuing to scan L to locate cards and to only remove cards vs entire velcro sheet. ? ? ?Pt left seated in w/c with NT / interpreter present with safety belt alarm engaged, call bell in reach, and all immediate needs met.  ? ? ?Therapy Documentation ?Precautions:  ?Precautions ?Precautions: Fall ?Precaution Comments: L eye blind, L hemiplegia ?Restrictions ?Weight Bearing Restrictions: No ? ?Pain: see session note ?  ?ADL: See Care Tool for more details. ? ?Therapy/Group: Individual Therapy ? ?Claudie Revering MS, OTR/L ? ?11/21/2021, 6:58 AM ?

## 2021-11-21 NOTE — Progress Notes (Signed)
Speech Language Pathology Daily Session Note ? ?Patient Details  ?Name: Douglas Pruitt ?MRN: [id number] ?Date of Birth: 08-19-1950 ? ?Today's Date: 11/21/2021 ?SLP Individual Time: 3846-6599 ?SLP Individual Time Calculation (min): 58 min ? ?Short Term Goals: ?Week 3: SLP Short Term Goal 1 (Week 3): Patient will consume current diet without overt s/s of aspiration with use of swallowing compensatory strategies at mod I level ?SLP Short Term Goal 2 (Week 3): Patient will scan to left field of enviornment during functional tasks with min-to-mod A verbal and visual cues. ?SLP Short Term Goal 3 (Week 3): Patient will demonstrate functional problem solving for functional and familiar tasks with Min-to-mod A verbal cues. ?SLP Short Term Goal 4 (Week 3): [name] will recall new, daily information with min-to-mod A verbal cues. ? ?Skilled Therapeutic Interventions: Skilled ST treatment focused on cognitive goals. Pt was accompanied by nurse and interpreter on arrival. Pain medication given prior to session as well muscle rub. Nurse had applied damp, cool washcloth to L shoulder d/t complaints of warm/hot sensation. Throughout session patient perseverated on "hot" and "burning" sensation on both L+R shoulders, suspect may attribute to muscle cream that was applied to upper arms/shoulders prior to session secondary to cooling/minty effect? SLP applied damp washcloth in attempt to remove excess and replaced cold washcloth on both arms. This appeared mildly effective however pt continued to complain of this sensation throughout session and appeared quite bothered. SLP notified MD of pt's discomfort during team conference. Due to significant internal distraction to discomfort, patient declined consideration of breakfast. He denied chewing/swallowing difficulty since diet advancement yesterday. Still recommend f/u with dys 3 textures to assess tolerance during meal. Pt agreeable to oral care with oral sponge with min A for thoroughness,  especially to reach mid tongue. Pt able to effectively orally expel secretions into oral care basin. SLP facilitated ID of unsafe scenarios via picture scenes with mod A verbal cues and explanation, as well as min-to-mod A verbal cues for generating appropriate solutions. At times patient would provide unrelated response to scenarios while omitting obvious safety concerns (example: person cutting bread with large knife in unsafe manner. Pt response: "it could be hot"). Pt required mod A for L visual scanning. Patient was left in bed with alarm activated and immediate needs within reach at end of session. Interpreter at bedside. Pt with OT session immediately following. Notified OT of discomfort noted today. Continue per current plan of care.   ?   ?Pain ?Pain Assessment ?Pain Scale: Faces ?Faces Pain Scale: Hurts a little bit ? ?Therapy/Group: Individual Therapy ? ?Jemmie Ledgerwood T Nayana Lenig ?11/21/2021, 12:49 PM ?

## 2021-11-21 NOTE — Progress Notes (Signed)
Patient ID: Douglas Pruitt, male   DOB: [birth year], 71 y.o.   MRN: [id number] ? ?Sw made attempt to reach out to patient daughter to provide conference updates. Patient daughter reported that she is currently working and will return SW call after 1PM.  ?

## 2021-11-22 LAB — CREATININE, SERUM
Creatinine, Ser: 0.68 mg/dL (ref 0.61–1.24)
GFR, Estimated: >60 mL/min (ref >60)

## 2021-11-22 NOTE — Progress Notes (Signed)
?                                                       PROGRESS NOTE ? ? ?Subjective/Complaints: ? ?No interpreter in room , no Jarai on Ipad  ? ?ROS: Negative CP, SOB, N/V/D, no bowel or bladder issues  ? ? ?Objective: ?  ?No results found. ?Recent Labs  ?  11/19/21 ?1013  ?WBC 11.3*  ?HGB 10.5*  ?HCT 30.4*  ?PLT 307  ? ? ?Recent Labs  ?  11/19/21 ?1013 11/22/21 ?[id number]  ?NA 131*  --   ?K 3.6  --   ?CL 94*  --   ?CO2 28  --   ?GLUCOSE 120*  --   ?BUN 11  --   ?CREATININE 0.70 0.68  ?CALCIUM 8.8*  --   ? ? ? ? ?Intake/Output Summary (Last 24 hours) at 11/22/2021 0843 ?Last data filed at 11/21/2021 2100 ?Gross per 24 hour  ?Intake 480 ml  ?Output 175 ml  ?Net 305 ml  ? ?  ? ?  ? ?Physical Exam: ? ? ?General: No acute distress ?Mood and affect are appropriate ?Heart: Regular rate and rhythm no rubs murmurs or extra sounds ?Lungs: Clear to auscultation, breathing unlabored, no rales or wheezes ?Abdomen: Positive bowel sounds, soft nontender to palpation, nondistended ?Extremities: No clubbing, cyanosis, or edema ?Skin: No evidence of breakdown, no evidence of rash ?Neurologic: Cranial nerves II through XII intact, motor strength is 5/5 in right and 0/5 left deltoid, bicep, tricep, grip, hip flexor, knee extensors, ankle dorsiflexor and plantar flexor ? ?Musculoskeletal: Full range of motion in all 4 extremities. Left shoulder subluxation  ?No pain with shoulder ROM ? ?Vital Signs ?Blood pressure 139/65, pulse (!) 51, temperature 98 ?F (36.7 ?C), resp. rate 18, height 5\' 4"  (1.626 m), weight 67.1 kg, SpO2 100 %. ? ? ? ?Assessment/Plan: ?1. Functional deficits which require 3+ hours per day of interdisciplinary therapy in a comprehensive inpatient rehab setting. ?Physiatrist is providing close team supervision and 24 hour management of active medical problems listed below. ?Physiatrist and rehab team continue to assess barriers to discharge/monitor patient progress toward functional and medical goals ? ?Care  Tool: ? ?Bathing ? Bathing activity did not occur: Safety/medical concerns ?Body parts bathed by patient: Left arm, Chest, Abdomen, Front perineal area, Right upper leg, Left upper leg, Face, Right lower leg  ? Body parts bathed by helper: Left lower leg, Buttocks ?  ?  ?Bathing assist Assist Level: Moderate Assistance - Patient 50 - 74% ?  ?  ?Upper Body Dressing/Undressing ?Upper body dressing Upper body dressing/undressing activity did not occur (including orthotics): Safety/medical concerns ?What is the patient wearing?: Pull over shirt ?   ?Upper body assist Assist Level: Maximal Assistance - Patient 25 - 49% (overt cueing to attend to left arm, patient perseverative.) ?   ?Lower Body Dressing/Undressing ?Lower body dressing ? ? ? Lower body dressing activity did not occur: Safety/medical concerns ?What is the patient wearing?: Pants ? ?  ? ?Lower body assist Assist for lower body dressing: Maximal Assistance - Patient 25 - 49% ?   ? ?Toileting ?Toileting Toileting Activity did not occur (AcupuncturistClothing management and hygiene only): N/A (no void or bm)  ?Toileting assist Assist for toileting: Set up assist (urinal) ?  ?  ?Transfers ?Chair/bed transfer ? ?  Transfers assist ? Chair/bed transfer activity did not occur: Safety/medical concerns ? ?Chair/bed transfer assist level: Moderate Assistance - Patient 50 - 74% ?  ?  ?Locomotion ?Ambulation ? ? ?Ambulation assist ? ? Ambulation activity did not occur: Safety/medical concerns ? ?Assist level: 2 helpers ?Assistive device: Other (comment) (rail in hall) ?Max distance: 15  ? ?Walk 10 feet activity ? ? ?Assist ?   ? ?Assist level: 2 helpers ?Assistive device: Other (comment)  ? ?Walk 50 feet activity ? ? ?Assist Walk 50 feet with 2 turns activity did not occur: Safety/medical concerns ? ?  ?   ? ? ?Walk 150 feet activity ? ? ?Assist Walk 150 feet activity did not occur: Safety/medical concerns ? ?  ?  ?  ? ?Walk 10 feet on uneven surface  ?activity ? ? ?Assist Walk 10  feet on uneven surfaces activity did not occur: Safety/medical concerns ? ? ?  ?   ? ?Wheelchair ? ? ? ? ?Assist Is the patient using a wheelchair?: Yes ?Type of Wheelchair: Manual ?  ? ?Wheelchair assist level: Dependent - Patient 0% ?Max wheelchair distance: 150  ? ? ?Wheelchair 50 feet with 2 turns activity ? ? ? ?Assist ? ?  ?  ? ? ?Assist Level: Dependent - Patient 0%  ? ?Wheelchair 150 feet activity  ? ? ? ?Assist ?   ? ? ?Assist Level: Dependent - Patient 0%  ? ?Blood pressure 139/65, pulse (!) 51, temperature 98 ?F (36.7 ?C), resp. rate 18, height 5\' 4"  (1.626 m), weight 67.1 kg, SpO2 100 %. ? ?Medical Problem List and Plan: ?1. 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         - Patient may shower ?         - ELOS/Goals: Supervision to min A 11/27/2021 ?- Continue CIR PT, SLP,  ? ?2.  Antithrombotics: ?-DVT/anticoagulation: Pharmaceutical: Lovenox ?            -antiplatelet therapy: Aspirin 81 mg daily and Plavix 71 mg daily x3 months then aspirin alone. ?3. Pain Management: Tylenol as needed,  ?Pt on low dose prn tramadol which pt appears to tolerate without drowsiness, per family request try acetaminophen first ?Off gabapentin due to drowsiness ?D/c muscle rub due to irritation, will trial voltaren gel ?4. Mood: Provide emotional support ?- Antipsychotic agents: N/A ?5. Neuropsych: This patient is capable of making decisions on his own behalf. ?6. Skin/Wound Care: Routine skin checks ?7. Fluids/Electrolytes/Nutrition: Routine in and outs with follow-up chemistries ?8.  Hypertension.   ? ? ?  11/22/2021  ?  5:52 AM 11/21/2021  ?  8:53 PM 11/21/2021  ?  2:15 PM  ?Vitals with BMI  ?Systolic 139 143 11/23/2021  ?Diastolic 65 61 67  ?Pulse 51 56 52  ? BP in range  ?9.  Hyperlipidemia continue Lipitor tablet 40 mg at bedtime daily. ?10.  Chronic left eye blindness due  to related work accident.  Follow-up as outpatient. ?11.  Obesity.  BMI 27.09.  Dietary follow-up ?12.  Urinary hesitation-PVRS/bladder scans to make sure patient's not retaining.  Had mildly elevated BCs urine culture reviewed showed no growth WBCs now are within normal limits. ?Had a low grade temp x 1 with mild leukocytosis Recheck UA, essentially neg  ?No recorded I/O cath in several days cont flomax  ?13.  Insomnia: Continue melatonin 3 mg HS ?14.  Bradycardia: mild asymptomatic , EKG on 4/5 sinus brady arate 59 bpm, no other abnormalities ?Not on beta blocker  ? ?  ?LOS: ?21 days ?A FACE TO FACE EVALUATION WAS PERFORMED ? ?Erick Colace, MD ?11/22/2021, 8:43 AM  ? ? ? ?

## 2021-11-22 NOTE — Progress Notes (Addendum)
Occupational Therapy Session Note ? ?Patient Details  ?Name: Douglas Pruitt ?MRN: [id number] ?Date of Birth: 12/16/1950 ? ?Today's Date: 11/22/2021 ?OT Individual Time: 6767-2094 ?OT Individual Time Calculation (min): 68 min  ? ? ?Short Term Goals: ?Week 1:  OT Short Term Goal 1 (Week 1): Pt will complete sit to stand at sink with mod A. ?OT Short Term Goal 1 - Progress (Week 1): Met ?OT Short Term Goal 2 (Week 1): Pt will don shirt with min A. ?OT Short Term Goal 2 - Progress (Week 1): Met ?OT Short Term Goal 3 (Week 1): Pt will complete toilet transfer with max A and LRAD. ?OT Short Term Goal 3 - Progress (Week 1): Not met ?OT Short Term Goal 4 (Week 1): Pt will therapeutic position LUE in chair/bed with no more than min VCs. ?OT Short Term Goal 4 - Progress (Week 1): Not met ?Week 2:  OT Short Term Goal 1 (Week 2): Pt will complete toilet transfer with max A and LRAD. ?OT Short Term Goal 1 - Progress (Week 2): Met ?OT Short Term Goal 2 (Week 2): Pt will therapeutic position LUE in chair/bed with no more than min VCs. ?OT Short Term Goal 2 - Progress (Week 2): Not met ?OT Short Term Goal 3 (Week 2): Pt will don shirt with S. ?OT Short Term Goal 3 - Progress (Week 2): Not met ?OT Short Term Goal 4 (Week 2): Pt will don pants with mod A. ?OT Short Term Goal 4 - Progress (Week 2): Not met ?Week 3:  OT Short Term Goal 1 (Week 3): Pt will complete standing grooming task with mod A. ?OT Short Term Goal 2 (Week 3): Pt will pull down pants during toileting with mod A for balance. ?OT Short Term Goal 3 (Week 3): Pt will complete squat-pivot transfer to droparm 3in1 with mod A. ?OT Short Term Goal 4 (Week 3): Pt will verbalize >2 safe positioning strategies for LUE. ? ?Skilled Therapeutic Interventions/Progress Updates:  ?  Pt received semi-reclined in bed, reports having had incontinent BM, agreeable to therapy. Session focus on self-care retraining, activity tolerance, LUE NMR in prep for improved ADL/IADL/func mobility performance  + decreased caregiver burden. Did call interpreter line, no answer. Answer 30 min later into session and phone interpreter utilized for remainder of session. ? ?Pt came to sitting EOB on his L with max A to progress LLE + lift trunk + and for RUE placement on bed rail. Sit to stand in stedy x3 throughout with close S and therapist provided total A for LBD/pericare. Pt donned shirt with min A to thread LUE. Brushed seated while seated with set-up A of materials and close S, poor thoroughness despite encouragement. Total A to don B shoes/L AFO and dep transport to therapy gym. ? ?Pt requesting to retry estim to L shoulder. Reports ongoing "hot" sensation in B shoulders. Therapist applied Saebo Stim One NMES to the L wrist/digit extensors then later L shoulder for treatment of subluxation set at the below settings:   ?  ?Saebo Stim One ?Intensity: 11, 5 clicks ?Duration: 5 min, 60 min ?330 pulse width ?35 Hz pulse rate ?On 8 sec/ off 8 sec ?Ramp up/ down 2 sec ?Symmetrical Biphasic wave form ?Max intensity 165m at 500 Ohm load ? ?Pt denies initial pain and skin in intact. Total A to facilitate grasp/release on soft sponge, cues to attend to LUE throughout. Pt unable to return demonstration of facilitating grasp with R hand, tends to grab and  Occupational Therapy Session Note ? ?Patient Details  ?Name: Douglas Pruitt ?MRN: [id number] ?Date of Birth: 12/16/1950 ? ?Today's Date: 11/22/2021 ?OT Individual Time: 6767-2094 ?OT Individual Time Calculation (min): 68 min  ? ? ?Short Term Goals: ?Week 1:  OT Short Term Goal 1 (Week 1): Pt will complete sit to stand at sink with mod A. ?OT Short Term Goal 1 - Progress (Week 1): Met ?OT Short Term Goal 2 (Week 1): Pt will don shirt with min A. ?OT Short Term Goal 2 - Progress (Week 1): Met ?OT Short Term Goal 3 (Week 1): Pt will complete toilet transfer with max A and LRAD. ?OT Short Term Goal 3 - Progress (Week 1): Not met ?OT Short Term Goal 4 (Week 1): Pt will therapeutic position LUE in chair/bed with no more than min VCs. ?OT Short Term Goal 4 - Progress (Week 1): Not met ?Week 2:  OT Short Term Goal 1 (Week 2): Pt will complete toilet transfer with max A and LRAD. ?OT Short Term Goal 1 - Progress (Week 2): Met ?OT Short Term Goal 2 (Week 2): Pt will therapeutic position LUE in chair/bed with no more than min VCs. ?OT Short Term Goal 2 - Progress (Week 2): Not met ?OT Short Term Goal 3 (Week 2): Pt will don shirt with S. ?OT Short Term Goal 3 - Progress (Week 2): Not met ?OT Short Term Goal 4 (Week 2): Pt will don pants with mod A. ?OT Short Term Goal 4 - Progress (Week 2): Not met ?Week 3:  OT Short Term Goal 1 (Week 3): Pt will complete standing grooming task with mod A. ?OT Short Term Goal 2 (Week 3): Pt will pull down pants during toileting with mod A for balance. ?OT Short Term Goal 3 (Week 3): Pt will complete squat-pivot transfer to droparm 3in1 with mod A. ?OT Short Term Goal 4 (Week 3): Pt will verbalize >2 safe positioning strategies for LUE. ? ?Skilled Therapeutic Interventions/Progress Updates:  ?  Pt received semi-reclined in bed, reports having had incontinent BM, agreeable to therapy. Session focus on self-care retraining, activity tolerance, LUE NMR in prep for improved ADL/IADL/func mobility performance  + decreased caregiver burden. Did call interpreter line, no answer. Answer 30 min later into session and phone interpreter utilized for remainder of session. ? ?Pt came to sitting EOB on his L with max A to progress LLE + lift trunk + and for RUE placement on bed rail. Sit to stand in stedy x3 throughout with close S and therapist provided total A for LBD/pericare. Pt donned shirt with min A to thread LUE. Brushed seated while seated with set-up A of materials and close S, poor thoroughness despite encouragement. Total A to don B shoes/L AFO and dep transport to therapy gym. ? ?Pt requesting to retry estim to L shoulder. Reports ongoing "hot" sensation in B shoulders. Therapist applied Saebo Stim One NMES to the L wrist/digit extensors then later L shoulder for treatment of subluxation set at the below settings:   ?  ?Saebo Stim One ?Intensity: 11, 5 clicks ?Duration: 5 min, 60 min ?330 pulse width ?35 Hz pulse rate ?On 8 sec/ off 8 sec ?Ramp up/ down 2 sec ?Symmetrical Biphasic wave form ?Max intensity 165m at 500 Ohm load ? ?Pt denies initial pain and skin in intact. Total A to facilitate grasp/release on soft sponge, cues to attend to LUE throughout. Pt unable to return demonstration of facilitating grasp with R hand, tends to grab and

## 2021-11-22 NOTE — Progress Notes (Signed)
Speech Language Pathology Daily Session Note ? ?Patient Details  ?Name: Douglas Pruitt ?MRN: [id number] ?Date of Birth: 1950-12-18 ? ?Today's Date: 11/22/2021 ?SLP Individual Time: 4696-2952 ?SLP Individual Time Calculation (min): 45 min ? ?Short Term Goals: ?Week 3: SLP Short Term Goal 1 (Week 3): Patient will consume current diet without overt s/s of aspiration with use of swallowing compensatory strategies at mod I level ?SLP Short Term Goal 2 (Week 3): Patient will scan to left field of enviornment during functional tasks with min-to-mod A verbal and visual cues. ?SLP Short Term Goal 3 (Week 3): Patient will demonstrate functional problem solving for functional and familiar tasks with Min-to-mod A verbal cues. ?SLP Short Term Goal 4 (Week 3): [name] will recall new, daily information with min-to-mod A verbal cues. ? ?Skilled Therapeutic Interventions: Skilled ST treatment focused on swallowing goals. Pt was in bathroom and accompanied by nurse on arrival. SLP facilitated peri care with total A and sup A verbal cues for following commands using Stedy to transfer back to bed for remainder of session. SLP facilitated session by analyzing tolerance of dysphagia 3 textures and thin liquids with morning meal. Pt performed self feeding with max A for visual scanning to locate items on tray, even those within right visual field. Pt consumed soft breakfast potatoes and fruit cup containing pears. Pt exhibited prolonged and ineffective mastication secondary to edentulous status resulting in need to orally expel bolus during 3 occasions. Pt stated "no teeth, no good." Pt then requested for applesauce. Pt exhibited no overt s/sx of aspiration with applesauce and straw sips of thin liquids. Recommend downgrade to dysphagia 2 textures at this time. Pt in agreement. SLP offered pt additional pureed or mechanical soft textures with breakfast and he politely declined. Pt performed oral care with sup A for thoroughness. Provided with warm  washcloth to wash face to clear mild loss of secretions on left. Patient was left in bed with alarm activated and immediate needs within reach at end of session. Continue per current plan of care.   ?   ?Pain ?Pain Assessment ?Pain Scale: 0-10 ?Pain Score: 0-No pain ?Faces Pain Scale: No hurt ? ?Therapy/Group: Individual Therapy ? ?Douglas Pruitt T Douglas Pruitt ?11/22/2021, 8:38 AM ?

## 2021-11-22 NOTE — Progress Notes (Signed)
Patient ID: Douglas Pruitt, male   DOB: 07/03/1951, 71 y.o.   MRN: [id number] ? ?Hemi Height WC, Hemi Rw and Slide board ordered through [employer] ?

## 2021-11-23 NOTE — Progress Notes (Signed)
Speech Language Pathology Daily Session Note ? ?Patient Details  ?Name: Douglas Pruitt ?MRN: [id number] ?Date of Birth: Jun 08, 1951 ? ?Today's Date: 11/23/2021 ?SLP Individual Time: 5701-7793 ?SLP Individual Time Calculation (min): 45 min ? ?Short Term Goals: ?Week 3: SLP Short Term Goal 1 (Week 3): Patient will consume current diet without overt s/s of aspiration with use of swallowing compensatory strategies at mod I level ?SLP Short Term Goal 2 (Week 3): Patient will scan to left field of enviornment during functional tasks with min-to-mod A verbal and visual cues. ?SLP Short Term Goal 3 (Week 3): Patient will demonstrate functional problem solving for functional and familiar tasks with Min-to-mod A verbal cues. ?SLP Short Term Goal 4 (Week 3): [name] will recall new, daily information with min-to-mod A verbal cues. ? ?Skilled Therapeutic Interventions: Skilled ST treatment focused on pt/family education with pt's daughter. SLP provided verbal and written communication re: swallowing strategies (i.e., lingual sweep to assess for L pocketing, monitoring minimal L anterior spillage), current diet recommendations, oral care, cognitive-communication deficits, attention/memory/visual scanning strategies, safety considerations with cognitive impairment, and recommendations for follow up SLP services in [facility] setting. Daughter verbalized understanding through teach back. Daughter stated pt consumed textures consistent with dys 2 diet at baseline 2/2 edentulous status and would be able to continue to support this diet/texture at discharge. Pt requested to get into bed at end of session. SLP facilitated transfer from [facility] to bed using Stedy and sup A verbal cues for safety due to attempts to stand when feet and Stedy were not in proper position. Patient was left in bed with alarm activated and immediate needs within reach at end of session. Family at bedside. Continue per current plan of care.   ?   ?Pain ? None/denied ? ?Therapy/Group:  Individual Therapy ? ?Wendell Nicoson T [name] ?11/23/2021, 4:20 PM ?

## 2021-11-23 NOTE — Progress Notes (Signed)
?                                                       PROGRESS NOTE ? ? ?Subjective/Complaints: ? ?No interpreter in room , no Jarai on Ipad  ?Pt feels cold , still sleepy , did not eat breakfast yet  ? ?ROS: Negative CP, SOB, N/V/D, no bowel or bladder issues  ? ? ?Objective: ?  ?No results found. ?No results for input(s): WBC, HGB, HCT, PLT in the last 72 hours. ? ?Recent Labs  ?  11/22/21 ?[id number]  ?CREATININE 0.68  ? ? ? ? ?Intake/Output Summary (Last 24 hours) at 11/23/2021 0759 ?Last data filed at 11/23/2021 0600 ?Gross per 24 hour  ?Intake 480 ml  ?Output --  ?Net 480 ml  ? ?  ? ?  ? ?Physical Exam: ? ? ?General: No acute distress ?Mood and affect are appropriate ?Heart: Regular rate and rhythm no rubs murmurs or extra sounds ?Lungs: Clear to auscultation, breathing unlabored, no rales or wheezes ?Abdomen: Positive bowel sounds, soft nontender to palpation, nondistended ?Extremities: No clubbing, cyanosis, or edema ?Skin: No evidence of breakdown, no evidence of rash ?Neurologic: Cranial nerves II through XII intact, motor strength is 5/5 in right and 0/5 left deltoid, bicep, tricep, grip, hip flexor, knee extensors, ankle dorsiflexor and plantar flexor ?Reduced sensation to pinch LUE  ?Musculoskeletal: Full range of motion in all 4 extremities. Left shoulder subluxation  ?No pain with shoulder ROM ? ?Vital Signs ?Blood pressure 130/60, pulse (!) 56, temperature 98.1 ?F (36.7 ?C), temperature source Oral, resp. rate 18, height 5\' 4"  (1.626 m), weight 67.1 kg, SpO2 100 %. ? ? ? ?Assessment/Plan: ?1. Functional deficits which require 3+ hours per day of interdisciplinary therapy in a comprehensive inpatient rehab setting. ?Physiatrist is providing close team supervision and 24 hour management of active medical problems listed below. ?Physiatrist and rehab team continue to assess barriers to discharge/monitor patient progress toward functional and medical goals ? ?Care Tool: ? ?Bathing ? Bathing activity did not  occur: Safety/medical concerns ?Body parts bathed by patient: Left arm, Chest, Abdomen, Front perineal area, Right upper leg, Left upper leg, Face, Right lower leg  ? Body parts bathed by helper: Left lower leg, Buttocks ?  ?  ?Bathing assist Assist Level: Moderate Assistance - Patient 50 - 74% ?  ?  ?Upper Body Dressing/Undressing ?Upper body dressing Upper body dressing/undressing activity did not occur (including orthotics): Safety/medical concerns ?What is the patient wearing?: Pull over shirt ?   ?Upper body assist Assist Level: Maximal Assistance - Patient 25 - 49% (overt cueing to attend to left arm, patient perseverative.) ?   ?Lower Body Dressing/Undressing ?Lower body dressing ? ? ? Lower body dressing activity did not occur: Safety/medical concerns ?What is the patient wearing?: Pants ? ?  ? ?Lower body assist Assist for lower body dressing: Maximal Assistance - Patient 25 - 49% ?   ? ?Toileting ?Toileting Toileting Activity did not occur (AcupuncturistClothing management and hygiene only): N/A (no void or bm)  ?Toileting assist Assist for toileting: Set up assist (urinal) ?  ?  ?Transfers ?Chair/bed transfer ? ?Transfers assist ? Chair/bed transfer activity did not occur: Safety/medical concerns ? ?Chair/bed transfer assist level: Moderate Assistance - Patient 50 - 74% ?  ?  ?Locomotion ?Ambulation ? ? ?Ambulation assist ? ?  Ambulation activity did not occur: Safety/medical concerns ? ?Assist level: 2 helpers ?Assistive device: Other (comment) (rail in hall) ?Max distance: 15  ? ?Walk 10 feet activity ? ? ?Assist ?   ? ?Assist level: 2 helpers ?Assistive device: Other (comment)  ? ?Walk 50 feet activity ? ? ?Assist Walk 50 feet with 2 turns activity did not occur: Safety/medical concerns ? ?  ?   ? ? ?Walk 150 feet activity ? ? ?Assist Walk 150 feet activity did not occur: Safety/medical concerns ? ?  ?  ?  ? ?Walk 10 feet on uneven surface  ?activity ? ? ?Assist Walk 10 feet on uneven surfaces activity did not  occur: Safety/medical concerns ? ? ?  ?   ? ?Wheelchair ? ? ? ? ?Assist Is the patient using a wheelchair?: Yes ?Type of Wheelchair: Manual ?  ? ?Wheelchair assist level: Dependent - Patient 0% ?Max wheelchair distance: 150  ? ? ?Wheelchair 50 feet with 2 turns activity ? ? ? ?Assist ? ?  ?  ? ? ?Assist Level: Dependent - Patient 0%  ? ?Wheelchair 150 feet activity  ? ? ? ?Assist ?   ? ? ?Assist Level: Dependent - Patient 0%  ? ?Blood pressure 130/60, pulse (!) 56, temperature 98.1 ?F (36.7 ?C), temperature source Oral, resp. rate 18, height 5\' 4"  (1.626 m), weight 67.1 kg, SpO2 100 %. ? ?Medical Problem List and Plan: ?1. 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         - Patient may shower ?         - ELOS/Goals: Supervision to min A 11/27/2021 ?- Continue CIR PT, SLP,  ? ?2.  Antithrombotics: ?-DVT/anticoagulation: Pharmaceutical: Lovenox ?            -antiplatelet therapy: Aspirin 81 mg daily and Plavix 71 mg daily x3 months then aspirin alone. ?3. Pain Management: Tylenol as needed,  ?Pt on low dose prn tramadol which pt appears to tolerate without drowsiness, per family request try acetaminophen first ?Off gabapentin due to drowsiness ?D/c muscle rub due to irritation, will trial voltaren gel ?4. Mood: Provide emotional support ?- Antipsychotic agents: N/A ?5. Neuropsych: This patient is capable of making decisions on his own behalf. ?6. Skin/Wound Care: Routine skin checks ?7. Fluids/Electrolytes/Nutrition: Routine in and outs with follow-up chemistries ?8.  Hypertension.   ? ? ?  11/23/2021  ?  3:09 AM 11/22/2021  ?  8:03 PM 11/22/2021  ?  3:24 PM  ?Vitals with BMI  ?Systolic 130 126 11/24/2021  ?Diastolic 60 70 67  ?Pulse 56 57 60  ? BP in range  ?9.  Hyperlipidemia continue Lipitor tablet 40 mg at bedtime daily. ?10.  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  Follow-up as outpatient. ?11.  Obesity.  BMI 27.09.  Dietary follow-up ?12.  Urinary hesitation-PVRS/bladder scans to make sure patient's not retaining.  Had mildly elevated BCs urine culture reviewed showed no growth WBCs now are within normal limits. ?Had a low grade temp x 1 with mild leukocytosis Recheck UA, essentially neg  ?No recorded I/O cath in several days cont flomax , last bladder scan normal at 70ml  ?13.  Insomnia: Continue melatonin 3 mg HS ?14.  Bradycardia: mild asymptomatic , EKG on 4/5 sinus brady arate 59 bpm, no other abnormalities ?Not on beta blocker  ? ?  ?LOS: ?  22 days ?A FACE TO FACE EVALUATION WAS PERFORMED ? ?Erick Colace, MD ?11/23/2021, 7:59 AM  ? ? ? ?

## 2021-11-23 NOTE — Progress Notes (Addendum)
Speech Language Pathology Discharge Summary ? ?Patient Details  ?Name: Douglas Pruitt ?MRN: [id number] ?Date of Birth: 01-24-1951 ? ? ?Patient has met 5 of 5 long term goals.  Patient to discharge at overall Mod;Min level.  ? ?Reasons goals not met: None - all goals met  ? ?Clinical Impression/Discharge Summary: Patient has made relatively slow but functional progress and has met 5 of 5 long-term goals this admission due to improved implementation of swallowing compensatory strategies, speech intelligibility, basic problem solving, and intellectual/emergent awareness.Patient is currently consuming a dysphagia 2 diet and thin liquids with sup A cues to monitor and clear left anterior spillage and assess for infrequent pocketing in left buccal cavity. After speaking with daughter, pt consumed consistencies similar to dys 2 diet at baseline d/t edentulous status. Patient and family education is complete and patient to discharge at overall min-to-mod A level from a cognitive perspective, and sup A for swallowing and during meals. Patient's care partner is unable to provide the necessary physical and cognitive assistance needed at discharge, therefore, patient will discharge to a SNF to maximize his cognitive and swallowing function and reduce caregiver burden. ?  ?Care Partner:  ?Caregiver Able to Provide Assistance: No ?Type of Caregiver Assistance: Physical;Cognitive ? ?Recommendation:  ?SNF  ?Rationale for SLP Follow Up: Maximize cognitive function and independence;Maximize swallowing safety  ? ?Equipment: None recommended by SLP  ? ?Reasons for discharge: Treatment goals met;Discharged from hospital  ? ?Patient/Family Agrees with Progress Made and Goals Achieved: Yes  ? ? ?Brianne T Garretson ?11/23/2021, 4:16 PM ? ?

## 2021-11-23 NOTE — Progress Notes (Signed)
Physical Therapy Session Note ? ?Patient Details  ?Name: Douglas Pruitt ?MRN: [id number] ?Date of Birth: 07-15-1951 ? ?Today's Date: 11/22/2021 ?PT Individual Time: 1300-1400 ?  60 min  ? ?Short Term Goals: ? ?Week 2:  PT Short Term Goal 1 (Week 2): Pt will transfer to and from [facility] with min assist ?PT Short Term Goal 2 (Week 2): Pt will ambulate 27f with max assist of 1. ?PT Short Term Goal 3 (Week 2): Pt will ascend 4 steps wth max assist with 1 UE support ?PT Short Term Goal 4 (Week 2): Pt will perform bed mobility with consistent MinA. ? ? ?Skilled Therapeutic Interventions/Progress Updates:  ? ?Pt received supine in bed and agreeable to PT. Supine>sit transfer with min assist and cues for trunkal activation. Donning shoes sitting EOB with total A for time management supervision assist for sitting balance. [facility]. Orthotist present for evaluation. Pt performed gait training at rail in hall and with [place] each with max assist from PT for improved weight shift R to advance the LLE and tactile cues for actiaviton of extension on the L side in stance. Noted to to improved motor planning with HW vs rail in hall to engage hip extension.  ? ?WC propulsion x 1545fwith supervision assist and multiple seated breaks to correct slouched posture from trunkal extension while propeling with RLE and RUE.  ? ?[facility]<>[facility] on flat surface x 5 with min-mod assist to achieve full standing. Pt then performed sit<>stand swith RLE elevated to force use of LLE with max assist to engage LLE.  ? ?Car transfer with min assist with Slide board and mod assist -- max Assist with stand pivot to SUV height car. LLE blocked with no AFO  ? ?Pt returned to room and performed squat pivot transfer to bed with min assist and UE support on rail. Sit>supine completed with min assist on the LLE and left supine in bed with call bell in reach and all needs met.  ? ? ?   ? ?Therapy  Documentation ?Precautions:  ?Precautions ?Precautions: Fall ?Precaution Comments: L eye blind, L hemiplegia ?Restrictions ?Weight Bearing Restrictions: No ? ? ?Pain: ? Faces: hurts a little ( shoulder) burning. RN aware ? ? ?Therapy/Group: Individual Therapy ? ?AuLorie Phenix4/28/2023, 7:51 AM  ?

## 2021-11-23 NOTE — Progress Notes (Signed)
Occupational Therapy Session Note ? ?Patient Details  ?Name: Douglas Pruitt ?MRN: [id number] ?Date of Birth: June 25, 1951 ? ?Today's Date: 11/23/2021 ?OT Individual Time: [phone] ?OT Individual Time Calculation (min): 56 min  ? ? ?Short Term Goals: ?Week 1:  OT Short Term Goal 1 (Week 1): Pt will complete sit to stand at sink with mod A. ?OT Short Term Goal 1 - Progress (Week 1): Met ?OT Short Term Goal 2 (Week 1): Pt will don shirt with min A. ?OT Short Term Goal 2 - Progress (Week 1): Met ?OT Short Term Goal 3 (Week 1): Pt will complete toilet transfer with max A and LRAD. ?OT Short Term Goal 3 - Progress (Week 1): Not met ?OT Short Term Goal 4 (Week 1): Pt will therapeutic position LUE in chair/bed with no more than min VCs. ?OT Short Term Goal 4 - Progress (Week 1): Not met ?Week 2:  OT Short Term Goal 1 (Week 2): Pt will complete toilet transfer with max A and LRAD. ?OT Short Term Goal 1 - Progress (Week 2): Met ?OT Short Term Goal 2 (Week 2): Pt will therapeutic position LUE in chair/bed with no more than min VCs. ?OT Short Term Goal 2 - Progress (Week 2): Not met ?OT Short Term Goal 3 (Week 2): Pt will don shirt with S. ?OT Short Term Goal 3 - Progress (Week 2): Not met ?OT Short Term Goal 4 (Week 2): Pt will don pants with mod A. ?OT Short Term Goal 4 - Progress (Week 2): Not met ?Week 3:  OT Short Term Goal 1 (Week 3): Pt will complete standing grooming task with mod A. ?OT Short Term Goal 2 (Week 3): Pt will pull down pants during toileting with mod A for balance. ?OT Short Term Goal 3 (Week 3): Pt will complete squat-pivot transfer to droparm 3in1 with mod A. ?OT Short Term Goal 4 (Week 3): Pt will verbalize >2 safe positioning strategies for LUE. ? ?Skilled Therapeutic Interventions/Progress Updates:  ?   ?Pt received semi-reclined in bed with daughter and SIL and interpreter present, agreeable to therapy. Session focus on self-care retraining, activity tolerance, transfer retraining, family education in prep for  improved ADL/IADL/func mobility performance + decreased caregiver burden. ? ?Attempted to urinate with set-up A of urinal, no void with extended time given. Came to sitting EOB via log roll with max A to progress BLE off bed and to lift trunk. Demonstrated LBD and squat-pivot to and from drop arm BSC. Reviewed DME needs and daughter with questions on copays for possible DME, will f/u with SW.  ? ?Family education provided on : pt's current impairments and levels of assist needed for BADL and functional mobility. Daughter and SIL able to perform squat-pivot transfer with pt x2 with min A going to thread R, mod A going to the L, cues and min A from therapist to facilitate transfer more safely. Daughter additionally able to perform sit to stand with min A and use of HW to facilitate LBD. ? ?Pt reports need for BM. Stedy transfer > S toilet due to time constraints. NT notified on pt position. ? ?Pt left seated in toilet/on stedy with family and interpreter present, all immediate needs met.  ? ?Therapy Documentation ?Precautions:  ?Precautions ?Precautions: Fall ?Precaution Comments: L eye blind, L hemiplegia ?Restrictions ?Weight Bearing Restrictions: No ? ?Pain: no c/o ?  ?ADL: See Care Tool for more details. ? ?Therapy/Group: Individual Therapy ? ?Volanda Napoleon MS, OTR/L ? ?11/23/2021, 6:53 AM ?

## 2021-11-23 NOTE — Progress Notes (Signed)
Physical Therapy Session Note ? ?Patient Details  ?Name: Douglas Pruitt ?MRN: [id number] ?Date of Birth: [birth year] ? ?Today's Date: 11/23/2021 ?PT Individual Time: 1020-1100 1435-1500 ?PT Individual Time Calculation (min): 40 min 1500 min ? ?Short Term Goals: ?Week 1:  PT Short Term Goal 1 (Week 1): Pt will transfer to and from [facility] with min assist ?PT Short Term Goal 1 - Progress (Week 1): Progressing toward goal ?PT Short Term Goal 2 (Week 1): Pt will ambulate 34f with max assist of 1. ?PT Short Term Goal 2 - Progress (Week 1): Progressing toward goal ?PT Short Term Goal 3 (Week 1): Pt will ascend 4 steps wth max assist with 1 UE support ?PT Short Term Goal 3 - Progress (Week 1): Progressing toward goal ?PT Short Term Goal 4 (Week 1): Pt will perform bed mobility with mod assit consistently ?PT Short Term Goal 4 - Progress (Week 1): Met ?Week 2:  PT Short Term Goal 1 (Week 2): [facility] ?PT Short Term Goal 2 (Week 2): Pt will ambulate 361fwith max assist of 1. ?PT Short Term Goal 3 (Week 2): Pt will ascend 4 steps wth max assist with 1 UE support ?PT Short Term Goal 4 (Week 2): Pt will perform bed mobility with consistent MinA. ? ? ?Skilled Therapeutic Interventions/Progress Updates:  ? ?Session 1.  ?Pt received sitting in WC and agreeable to PT. Daughter and son present for education. [place] with min assist from PT in/out of car then performed by family x 2 with min-mod assist with cues for improved set up, improved anterior weight shift and facilitation to prevent LOB to the L. Cues also to block the LLE to force WB. PT performed gait training with AFO 2 x 1049fith ottoboc reation and thusane bluerocker style with max assist to block the LLE to prevent ER and knee buckle. Noted to have mild improved use of LLE with ottoboc brace. Educated family on bump up steps in WC [facility] access home. Patient returned to room and left sitting  [facility] and all needs met.   ? ?Session 2.  ?Pt received supine in bed and agreeable to PT. Supine>sit transfer with min assist on the LLE. Pt reports need for BM. Stedy transfer to toilet with supervision assist in stedy. Pt able to void bowel for small BM. Pericare performed by PT for safety standing in stedy. Pt returned to room and performed stedy transfer to bed with supervision assist in lift. Sit>supine completed with min assist on the LLE.  and left supine in bed with call bell in reach and all needs met.  ? ?  ? ?   ? ?Therapy Documentation ?Precautions:  ?Precautions ?Precautions: Fall ?Precaution Comments: L eye blind, L hemiplegia ?Restrictions ?Weight Bearing Restrictions: No ? ?Pain: ?session 1.  ?Denies  ?Session 2.  ?Denies  ? ? ?Therapy/Group: Individual Therapy ? ?AusLorie Phenix/28/2023, 6:15 PM  ?
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
## 2021-11-24 NOTE — Progress Notes (Signed)
Physical Therapy Weekly Progress Note ? ?Patient Details  ?Name: Douglas Pruitt ?MRN: [id number] ?Date of Birth: [birth year] ? ?Beginning of progress report period: November 10, 2021 ?End of progress report period: November 23, 2021 ? ?Today's Date: 11/24/2021 ?PT Individual Time: 4098-1191 ?PT Individual Time Calculation (min): 54 min  ? ?Patient has met 3 of 4 short term goals.  Pt is continuing to make slow progress towards LTG of min assist transfers and supervision assist WC mobility. Pt has been limiited by hip and bil shoulder pain,but noted to have improved attention and use of LLE for tranfers on this day. Family education completed x 2 days with emphsis on transfer training and safety at home.  ? ?Patient continues to demonstrate the following deficits muscle weakness, muscle joint tightness, and muscle paralysis, decreased cardiorespiratoy endurance, impaired timing and sequencing, unbalanced muscle activation, and motor apraxia, decreased visual perceptual skills and field cut, decreased attention to left, decreased attention, decreased awareness, decreased problem solving, decreased safety awareness, decreased memory, and delayed processing, and decreased sitting balance, decreased standing balance, decreased postural control, hemiplegia, and decreased balance strategies and therefore will continue to benefit from skilled PT intervention to increase functional independence with mobility. ? ?Patient progressing toward long term goals..  Continue plan of care. ? ?PT Short Term Goals ?Week 1:  PT Short Term Goal 1 (Week 1): Pt will transfer to and from [facility] with min assist ?PT Short Term Goal 1 - Progress (Week 1): Progressing toward goal ?PT Short Term Goal 2 (Week 1): Pt will ambulate 53ft with max assist of 1. ?PT Short Term Goal 2 - Progress (Week 1): Progressing toward goal ?PT Short Term Goal 3 (Week 1): Pt will ascend 4 steps wth max assist with 1 UE support ?PT Short Term Goal 3 - Progress (Week 1): Progressing  toward goal ?PT Short Term Goal 4 (Week 1): Pt will perform bed mobility with mod assit consistently ?PT Short Term Goal 4 - Progress (Week 1): Met ?Week 2:  PT Short Term Goal 1 (Week 2): Pt will transfer to and from [facility] with min assist ?PT Short Term Goal 1 - Progress (Week 2): Met ?PT Short Term Goal 2 (Week 2): Pt will ambulate 58ft with max assist of 1. ?PT Short Term Goal 2 - Progress (Week 2): Progressing toward goal ?PT Short Term Goal 3 (Week 2): Pt will ascend 4 steps wth max assist with 1 UE support ?PT Short Term Goal 3 - Progress (Week 2): Met ?PT Short Term Goal 4 (Week 2): Pt will perform bed mobility with consistent MinA. ?PT Short Term Goal 4 - Progress (Week 2): Met ?Week 3:  PT Short Term Goal 1 (Week 3): STG=LTG due to ELOS ? ?Skilled Therapeutic Interventions/Progress Updates:  ? ?Pt received sitting in WC and agreeable to PT.  ? ?Pt transported to rehab gym in [facility]. Gait training with Ottoboc and thusane anterior support AFO with no significant difference in LLE control with either brace. Max assist from PT for LLE limb advancement and to block ER in stance and facilitate full ROM into knee extension. Trace gluteal and quad activation with step through on the RLE.  ? ?Slide board and squat pivot transfer training with daughter x 3 each R and L with cues for BLE position and correct blocking of the LLE. Daughter reports need to transfer to small SUV. Care transfer to 28" seat height with mod assist from PT and daughter for LLE contrl.  ? ?WC mobility x  165ft with supervision assist for direction as well as improved anterior weight shift while performing knee flexion to pull chair with RLE. Pt demonstrating poor core activation resulting in slouched position after 30-63ft requiring cues and assist to correct.  ? ?Pt returned to room and performed squat pivot transfer to bed with min assist and UE supported on bed rail. Sit>supine completed with min assist on the LLE and left supine in bed with call  bell in reach and all needs met.  ? ? ?   ? ?Therapy Documentation ?Precautions:  ?Precautions ?Precautions: Fall ?Precaution Comments: L eye blind, L hemiplegia ?Restrictions ?Weight Bearing Restrictions: No ? ?  ?Vital Signs: ?Therapy Vitals ?Temp: 98 ?F (36.7 ?C) ?Pulse Rate: 64 ?Resp: 17 ?BP: 120/75 ?Patient Position (if appropriate): Sitting ?Oxygen Therapy ?SpO2: 100 % ?O2 Device: Room Air ?Pain: ?  denies ?Vision/Perception  ?Vision - History ?Ability to See in Adequate Light: 2 Moderately impaired ?Vision - Assessment ?Eye Alignment: Impaired (comment) (L eye dysconjugate gaze) ?Alignment/Gaze Preference: Gaze right ?Tracking/Visual Pursuits: Requires cues, head turns, or add eye shifts to track;Impaired - to be further tested in functional context ?Convergence: Impaired (comment) ?Additional Comments: Difficulty following commands to participate in visual tracking, pt perseverative on naming "pencil" and tracking with his finger. ?Perception ?Perception: Impaired ?Inattention/Neglect: Does not attend to left visual field;Does not attend to left side of body ?Praxis ?Praxis: Impaired ?Praxis Impairment Details: Motor planning;Perseveration  ?Mobility: ?Bed Mobility ?Bed Mobility: Supine to Sit;Sit to Supine ?Supine to Sit: Moderate Assistance - Patient 50-74% ?Sit to Supine: Minimal Assistance - Patient > 75% ?Transfers ?Transfers: Sit to Stand;Stand to Sit;Squat Pivot Transfers ?Sit to Stand: Minimal Assistance - Patient > 75% ?Stand to Sit: Minimal Assistance - Patient > 75% ?Squat Pivot Transfers: Minimal Assistance - Patient > 75%;Moderate Assistance - Patient 50-74% ?Transfer (Assistive device): Hemi-walker ? ?   ?Trunk/Postural Assessment : ?Cervical Assessment ?Cervical Assessment: Exceptions to [facility] (head turned R) ?Thoracic Assessment ?Thoracic Assessment: Within Functional Limits ?Lumbar Assessment ?Lumbar Assessment: Within Functional Limits ?Postural Control ?Postural Control: Deficits on  evaluation (heavy reliance on RUE support for static standing balance)  ?Balance: ?Balance ?Balance Assessed: Yes ?Static Sitting Balance ?Static Sitting - Balance Support: Feet supported ?Static Sitting - Level of Assistance: 5: Stand by assistance ?Dynamic Sitting Balance ?Dynamic Sitting - Balance Support: Feet supported ?Dynamic Sitting - Level of Assistance: 5: Stand by assistance;4: Min assist ?Static Standing Balance ?Static Standing - Balance Support: During functional activity;Right upper extremity supported ?Static Standing - Level of Assistance: 4: Min assist ? ?Therapy/Group: Individual Therapy ? ?Golden Pop ?11/24/2021, 2:29 PM  ?

## 2021-11-24 NOTE — Progress Notes (Signed)
?                                                       PROGRESS NOTE ? ? ?Subjective/Complaints: ? ?Daughter at bedside  ?THere is renovation at home and pt does not have access to bathroom.  Daughter inquiring about SNF for 90d  ? ? ?ROS: Negative CP, SOB, N/V/D, no bowel or bladder issues  ? ? ?Objective: ?  ?No results found. ?No results for input(s): WBC, HGB, HCT, PLT in the last 72 hours. ? ?Recent Labs  ?  11/22/21 ?[id number]  ?CREATININE 0.68  ? ? ? ? ?Intake/Output Summary (Last 24 hours) at 11/24/2021 0855 ?Last data filed at 11/24/2021 0400 ?Gross per 24 hour  ?Intake 920 ml  ?Output 200 ml  ?Net 720 ml  ? ?  ? ?  ? ?Physical Exam: ? ? ?General: No acute distress ?Mood and affect are appropriate ?Heart: Regular rate and rhythm no rubs murmurs or extra sounds ?Lungs: Clear to auscultation, breathing unlabored, no rales or wheezes ?Abdomen: Positive bowel sounds, soft nontender to palpation, nondistended ?Extremities: No clubbing, cyanosis, or edema ?Skin: No evidence of breakdown, no evidence of rash ?Neurologic: Cranial nerves II through XII intact, motor strength is 5/5 in right and 0/5 left deltoid, bicep, tricep, grip, hip flexor, knee extensors, ankle dorsiflexor and plantar flexor ?Reduced sensation to pinch LUE  ?Musculoskeletal: Full range of motion in all 4 extremities. Left shoulder subluxation  ?No pain with shoulder ROM ? ?Vital Signs ?Blood pressure (!) 114/59, pulse (!) 52, temperature 97.9 ?F (36.6 ?C), resp. rate 17, height 5\' 4"  (1.626 m), weight 67.1 kg, SpO2 99 %. ? ? ? ?Assessment/Plan: ?1. Functional deficits which require 3+ hours per day of interdisciplinary therapy in a comprehensive inpatient rehab setting. ?Physiatrist is providing close team supervision and 24 hour management of active medical problems listed below. ?Physiatrist and rehab team continue to assess barriers to discharge/monitor patient progress toward functional and medical goals ? ?Care Tool: ? ?Bathing ? Bathing  activity did not occur: Safety/medical concerns ?Body parts bathed by patient: Left arm, Chest, Abdomen, Front perineal area, Right upper leg, Left upper leg, Face, Right lower leg  ? Body parts bathed by helper: Left lower leg, Buttocks ?  ?  ?Bathing assist Assist Level: Moderate Assistance - Patient 50 - 74% ?  ?  ?Upper Body Dressing/Undressing ?Upper body dressing Upper body dressing/undressing activity did not occur (including orthotics): Safety/medical concerns ?What is the patient wearing?: Pull over shirt ?   ?Upper body assist Assist Level: Maximal Assistance - Patient 25 - 49% (overt cueing to attend to left arm, patient perseverative.) ?   ?Lower Body Dressing/Undressing ?Lower body dressing ? ? ? Lower body dressing activity did not occur: Safety/medical concerns ?What is the patient wearing?: Pants ? ?  ? ?Lower body assist Assist for lower body dressing: Maximal Assistance - Patient 25 - 49% ?   ? ?Toileting ?Toileting Toileting Activity did not occur (Probation officer and hygiene only): N/A (no void or bm)  ?Toileting assist Assist for toileting: Set up assist (urinal) ?  ?  ?Transfers ?Chair/bed transfer ? ?Transfers assist ? Chair/bed transfer activity did not occur: Safety/medical concerns ? ?Chair/bed transfer assist level: Moderate Assistance - Patient 50 - 74% ?  ?  ?Locomotion ?Ambulation ? ? ?  Ambulation assist ? ? Ambulation activity did not occur: Safety/medical concerns ? ?Assist level: 2 helpers ?Assistive device: Other (comment) (rail in hall) ?Max distance: 15  ? ?Walk 10 feet activity ? ? ?Assist ?   ? ?Assist level: 2 helpers ?Assistive device: Other (comment)  ? ?Walk 50 feet activity ? ? ?Assist Walk 50 feet with 2 turns activity did not occur: Safety/medical concerns ? ?  ?   ? ? ?Walk 150 feet activity ? ? ?Assist Walk 150 feet activity did not occur: Safety/medical concerns ? ?  ?  ?  ? ?Walk 10 feet on uneven surface  ?activity ? ? ?Assist Walk 10 feet on uneven surfaces  activity did not occur: Safety/medical concerns ? ? ?  ?   ? ?Wheelchair ? ? ? ? ?Assist Is the patient using a wheelchair?: Yes ?Type of Wheelchair: Manual ?  ? ?Wheelchair assist level: Dependent - Patient 0% ?Max wheelchair distance: 150  ? ? ?Wheelchair 50 feet with 2 turns activity ? ? ? ?Assist ? ?  ?  ? ? ?Assist Level: Dependent - Patient 0%  ? ?Wheelchair 150 feet activity  ? ? ? ?Assist ?   ? ? ?Assist Level: Dependent - Patient 0%  ? ?Blood pressure (!) 114/59, pulse (!) 52, temperature 97.9 ?F (36.6 ?C), resp. rate 17, height 5\' 4"  (1.626 m), weight 67.1 kg, SpO2 99 %. ? ?Medical Problem List and Plan: ?1. 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         - Patient may shower ?         - ELOS/Goals: Supervision to min A 11/27/2021 ?- Continue CIR PT, SLP,  ?WOuld qualify for SNF based on level of care , has [employer].  Will message SW regarding potential SNF transfer  ?2.  Antithrombotics: ?-DVT/anticoagulation: Pharmaceutical: Lovenox ?            -antiplatelet therapy: Aspirin 81 mg daily and Plavix 71 mg daily x3 months then aspirin alone. ?3. Pain Management: Tylenol as needed,  ?Pt on low dose prn tramadol which pt appears to tolerate without drowsiness, per family request try acetaminophen first ?Off gabapentin due to drowsiness ?D/c muscle rub due to irritation, will trial voltaren gel ?4. Mood: Provide emotional support ?- Antipsychotic agents: N/A ?5. Neuropsych: This patient is capable of making decisions on his own behalf. ?6. Skin/Wound Care: Routine skin checks ?7. Fluids/Electrolytes/Nutrition: Routine in and outs with follow-up chemistries ?8.  Hypertension.   ? ? ?  11/24/2021  ?  4:46 AM 11/23/2021  ?  7:57 PM 11/23/2021  ?  1:49 PM  ?Vitals with BMI  ?Systolic [id number]  ?Diastolic 59 82 73  ?Pulse 52 58 64  ? BP in range   ?9.  Hyperlipidemia continue Lipitor tablet 40 mg at bedtime daily. ?10.  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  Follow-up as outpatient. ? ?12.  Urinary hesitation-PVRS/bladder scans to make sure patient's not retaining.  Had mildly elevated BCs urine culture reviewed showed no growth WBCs now are within normal limits. ?Had a low grade temp x 1 with mild leukocytosis Recheck UA, essentially neg  ?No recorded I/O cath in several days cont flomax , last bladder scan normal at 7ml  ?13.  Insomnia: Continue melatonin 3 mg HS ?14.  Bradycardia: mild asymptomatic , EKG on 4/5  sinus brady arate 59 bpm, no other abnormalities ?Not on beta blocker  ? ?  ?LOS: ?23 days ?A FACE TO FACE EVALUATION WAS PERFORMED ? ?Charlett Blake, MD ?11/24/2021, 8:55 AM  ? ? ? ?

## 2021-11-24 NOTE — Progress Notes (Signed)
Occupational Therapy Discharge Summary ? ?Patient Details  ?Name: Douglas Pruitt ?MRN: [id number] ?Date of Birth: 03/20/1951 ? ? ? ?Patient has met 10 of 13 long term goals due to improved activity tolerance, improved balance, postural control, ability to compensate for deficits, improved attention, improved awareness, and improved coordination.  Patient to discharge at overall  min to max A  level.  Patient's care partner is independent to provide the necessary physical and cognitive assistance at discharge.  Pt to DC at the below ADL/bathroom transfer performance level. Assist levels represent pt's most consistent performance when alert/participatory. Pt continues to be primarily limited by dense LUE>LLE hemiplegia, L inattention, motor and topic perseverations, and cognitive deficits. Family education  completed with daughter and SIL with caregivers demonstrating good understanding of need for 24/7 S and good return demonstration of safe transfers. Pt and caregivers will benefit from continued [facility] OT to facilitate improved caregiver education, functional mobility, and occupational performance.  ? ? ?Reasons goals not met: Pt continues to be limited by dense L hemiplegia, L inattention, cognitive deficits, and poor postural control. Continues to require up to min A for dynamic sitting balance, total A for toileting and functional use of LUE. ? ?Recommendation:  ?Patient will benefit from ongoing skilled OT services in home health setting to continue to advance functional skills in the area of BADL and Reduce care partner burden. ? ?Equipment: ?TTB, droparm 3in1 ? ?Reasons for discharge: treatment goals met and discharge from hospital ? ?Patient/family agrees with progress made and goals achieved: Yes ? ?OT Discharge ?Precautions/Restrictions  ?Precautions ?Precautions: Fall ?Precaution Comments: L eye blind, L hemiplegia ?Restrictions ?Weight Bearing Restrictions: No ?ADL ?ADL ?Eating: Set up ?Where Assessed-Eating:  Wheelchair ?Grooming: Supervision/safety ?Where Assessed-Grooming: Sitting at sink ?Upper Body Bathing: Minimal assistance ?Where Assessed-Upper Body Bathing: Shower ?Lower Body Bathing: Moderate assistance ?Where Assessed-Lower Body Bathing: Shower, Standing at sink ?Upper Body Dressing: Minimal assistance ?Where Assessed-Upper Body Dressing: Sitting at sink ?Lower Body Dressing: Maximal assistance ?Where Assessed-Lower Body Dressing: Standing at sink ?Toileting: Maximal assistance ?Where Assessed-Toileting: Bedside Commode ?Toilet Transfer: Moderate assistance ?Toilet Transfer Method: Squat pivot ?Toilet Transfer Equipment: Drop arm bedside commode ?Tub/Shower Transfer: Moderate assistance ?Tub/Shower Transfer Method: Squat pivot ?Tub/Shower Equipment: Emergency planning/management officer ?Walk-In Shower Transfer: Not assessed ?Vision ?Baseline Vision/History: 1 Wears glasses;2 Legally blind (readers, blind L eye) ?Patient Visual Report: Blurring of vision (new blurring of vision in R eye) ?Vision Assessment?: Yes ?Eye Alignment: Impaired (comment) (L eye dysconjugate gaze) ?Alignment/Gaze Preference: Gaze right ?Tracking/Visual Pursuits: Requires cues, head turns, or add eye shifts to track;Impaired - to be further tested in functional context ?Convergence: Impaired (comment) ?Visual Fields: Left visual field deficit ?Additional Comments: Difficulty following commands to participate in visual tracking, pt perseverative on naming "pencil" and tracking with his finger. ?Perception  ?Perception: Impaired ?Inattention/Neglect: Does not attend to left visual field;Does not attend to left side of body ?Praxis ?Praxis: Impaired ?Praxis Impairment Details: Motor planning;Perseveration ?Cognition ?Cognition ?Overall Cognitive Status: Impaired/Different from baseline ?Arousal/Alertness: Awake/alert ?Orientation Level: Person;Place;Situation ?Person: Oriented ?Place: Oriented ?Situation: Oriented ?Memory: Impaired ?Memory Impairment: Storage  deficit;Decreased recall of new information;Decreased short term memory ?Decreased Short Term Memory: Verbal basic;Functional basic ?Attention: Sustained ?Focused Attention: Appears intact ?Sustained Attention: Impaired ?Sustained Attention Impairment: Verbal basic;Functional basic ?Awareness: Impaired ?Awareness Impairment: Intellectual impairment ?Problem Solving: Impaired ?Problem Solving Impairment: Verbal basic;Functional basic ?Behaviors: Impulsive;Perseveration ?Safety/Judgment: Impaired ?Comments: impulsive with mobility, occasional motor and verbal perseverations ?Brief Interview for Mental Status (BIMS) ?Repetition of Three Words (First Attempt): 3 ?  Temporal Orientation: Year: Correct ?Temporal Orientation: Month: Accurate within 5 days ?Temporal Orientation: Day: Incorrect ?Recall: "Sock": Yes, no cue required ?Recall: "Blue": Yes, no cue required ?Recall: "Bed": No, could not recall ?BIMS Summary Score: 12 ?Sensation ?Sensation ?Light Touch: Impaired Detail ?Peripheral sensation comments: unable to appreciate LT to L digits, thinks therapist is touching his toes and not his fingers ?Light Touch Impaired Details: Impaired LUE;Impaired LLE ?Hot/Cold: Appears Intact ?Proprioception: Impaired Detail ?Proprioception Impaired Details: Impaired LUE;Impaired LLE ?Stereognosis: Impaired by gross assessment ?Stereognosis Impaired Details: Impaired LUE ?Additional Comments: L hemiplegia ?Coordination ?Gross Motor Movements are Fluid and Coordinated: No ?Fine Motor Movements are Fluid and Coordinated: No ?Coordination and Movement Description: L hemiplegia ?Finger Nose Finger Test: unable to complete on L ?Motor  ?Motor ?Motor: Hemiplegia ?Motor - Discharge Observations: L hemiplegia ?Mobility  ?Bed Mobility ?Bed Mobility: Supine to Sit;Sit to Supine ?Supine to Sit: Moderate Assistance - Patient 50-74% ?Sit to Supine: Minimal Assistance - Patient > 75% ?Transfers ?Sit to Stand: Minimal Assistance - Patient >  75% ?Stand to Sit: Minimal Assistance - Patient > 75%  ?Trunk/Postural Assessment  ?Cervical Assessment ?Cervical Assessment: Exceptions to [facility] (head turned R) ?Thoracic Assessment ?Thoracic Assessment: Within Functional Limits ?Lumbar Assessment ?Lumbar Assessment: Within Functional Limits ?Postural Control ?Postural Control: Deficits on evaluation (heavy reliance on RUE support for static standing balance)  ?Balance ?Balance ?Balance Assessed: Yes ?Static Sitting Balance ?Static Sitting - Balance Support: Feet supported ?Static Sitting - Level of Assistance: 5: Stand by assistance ?Dynamic Sitting Balance ?Dynamic Sitting - Balance Support: Feet supported ?Dynamic Sitting - Level of Assistance: 5: Stand by assistance;4: Min assist ?Static Standing Balance ?Static Standing - Balance Support: During functional activity;Right upper extremity supported ?Static Standing - Level of Assistance: 4: Min assist ?Extremity/Trunk Assessment ?RUE Assessment ?RUE Assessment: Within Functional Limits ?LUE Assessment ?LUE Assessment: Exceptions to [facility] ?LUE Body System: Neuro ?Brunstrum levels for arm and hand: Arm;Hand ?Brunstrum level for arm: Stage II Synergy is developing ?Brunstrum level for hand: Stage I Flaccidity ? ? ?Claudie Revering MS, OTR/L ? ?11/24/2021, 10:45 AM ?

## 2021-11-24 NOTE — Progress Notes (Signed)
Occupational Therapy Session Note ? ?Patient Details  ?Name: Douglas Pruitt ?MRN: [id number] ?Date of Birth: February 25, 1951 ? ?Today's Date: 11/24/2021 ?OT Individual Time: 2620-3559 ?OT Individual Time Calculation (min): 55 min  ? ? ?Short Term Goals: ?Week 1:  OT Short Term Goal 1 (Week 1): Pt will complete sit to stand at sink with mod A. ?OT Short Term Goal 1 - Progress (Week 1): Met ?OT Short Term Goal 2 (Week 1): Pt will don shirt with min A. ?OT Short Term Goal 2 - Progress (Week 1): Met ?OT Short Term Goal 3 (Week 1): Pt will complete toilet transfer with max A and LRAD. ?OT Short Term Goal 3 - Progress (Week 1): Not met ?OT Short Term Goal 4 (Week 1): Pt will therapeutic position LUE in chair/bed with no more than min VCs. ?OT Short Term Goal 4 - Progress (Week 1): Not met ?Week 2:  OT Short Term Goal 1 (Week 2): Pt will complete toilet transfer with max A and LRAD. ?OT Short Term Goal 1 - Progress (Week 2): Met ?OT Short Term Goal 2 (Week 2): Pt will therapeutic position LUE in chair/bed with no more than min VCs. ?OT Short Term Goal 2 - Progress (Week 2): Not met ?OT Short Term Goal 3 (Week 2): Pt will don shirt with S. ?OT Short Term Goal 3 - Progress (Week 2): Not met ?OT Short Term Goal 4 (Week 2): Pt will don pants with mod A. ?OT Short Term Goal 4 - Progress (Week 2): Not met ?Week 3:  OT Short Term Goal 1 (Week 3): Pt will complete standing grooming task with mod A. ?OT Short Term Goal 2 (Week 3): Pt will pull down pants during toileting with mod A for balance. ?OT Short Term Goal 3 (Week 3): Pt will complete squat-pivot transfer to droparm 3in1 with mod A. ?OT Short Term Goal 4 (Week 3): Pt will verbalize >2 safe positioning strategies for LUE. ? ?Skilled Therapeutic Interventions/Progress Updates:  ?  Pt received semi-reclined in bed with daughter/interpreter present, denies pain, agreeable to therapy. Session focus on self-care retraining, activity tolerance, transfer retraining, family education in prep for  improved ADL/IADL/func mobility performance + decreased caregiver burden. Had spilled urinal in bed.  ? ?Came to sitting EOB with mod A to progress BLE off bed and to lift trunk. Squat-pivot throughout session with min A going R, light mod to his L in order to manage L hemibody and cues for safety due to impulsivity.  ? ?Seated on TTB bathed UB min A to bathe LUE, mod A to bathe LB due to pt only safely reaching upper BLE.  ? ?Donned shirt with min A to thread LUE, donned pants/underwear with max A to thread LLE and to pull over hips. Min A for standing balance and blocking L knee with RUE supported on sink. Total A to don B shoes and L AFO. ? ?Reviewed hemi UE management and pressure relief strategies. Daughter with no additional questions at this time. ? ? ?Pt left seated in w/c with RN, daughter and interpreter present, awaiting following SLP session and all immediate needs met.  ? ? ?Therapy Documentation ?Precautions:  ?Precautions ?Precautions: Fall ?Precaution Comments: L eye blind, L hemiplegia ?Restrictions ?Weight Bearing Restrictions: No ? ?Pain: denies ?  ?ADL: See Care Tool for more details. ? ? ?Therapy/Group: Individual Therapy ? ?Volanda Napoleon MS, OTR/L ? ?11/24/2021, 6:54 AM ?

## 2021-11-24 NOTE — Progress Notes (Signed)
Speech Language Pathology Daily Session Note ? ?Patient Details  ?Name: Douglas Pruitt ?MRN: [id number] ?Date of Birth: 06/26/1951 ? ?Today's Date: 11/24/2021 ?SLP Individual Time: 4081-4481 ?SLP Individual Time Calculation (min): 39 min ? ?Short Term Goals: ?Week 3: SLP Short Term Goal 1 (Week 3): Patient will consume current diet without overt s/s of aspiration with use of swallowing compensatory strategies at mod I level ?SLP Short Term Goal 2 (Week 3): Patient will scan to left field of enviornment during functional tasks with min-to-mod A verbal and visual cues. ?SLP Short Term Goal 3 (Week 3): Patient will demonstrate functional problem solving for functional and familiar tasks with Min-to-mod A verbal cues. ?SLP Short Term Goal 4 (Week 3): [name] will recall new, daily information with min-to-mod A verbal cues. ? ?Skilled Therapeutic Interventions: Skilled ST treatment focused on family education with pt's daughter. SLP provided continuation/reinforcement of education as initiated during yesterday's session. SLP education re: oral care techniques using oral sponge and mouthwash d/t edentulous status; supervision during meals to provide cues to monitor anterior spillage, assess for pocketing, and support with visual elements; strategies for visual scanning. Daughter verbalized and demonstrated understanding of visual scanning techniques, in addition to swallowing cues during pt's breakfast. Pt performed self feeding following set-up A and sup A verbal cues to monitor anterior spillage; mod A cues necessary for visual scanning to locate items on tray. Following meal pt performed oral care with sup A for thoroughness. SLP then facilitated additional visual scanning and problem solving using PEG task with mod A verbal/visual cues. All questions were addressed to daughter's satisfaction.  Patient was left in wheelchair with alarm activated and immediate needs within reach at end of session. Continue per current plan of  care.   ?   ?Pain ?Pain Assessment ?Pain Scale: 0-10 ?Pain Score: 0-No pain ?PAINAD (Pain Assessment in Advanced Dementia) ?Breathing: normal ?Negative Vocalization: none ?Facial Expression: smiling or inexpressive ?Body Language: relaxed ?Consolability: no need to console ?PAINAD Score: 0 ? ?Therapy/Group: Individual Therapy ? ?Birdella Sippel T Tashawn Laswell ?11/24/2021, 12:46 PM ?

## 2021-11-25 NOTE — Plan of Care (Signed)
?  Problem: RH Stairs ?Goal: LTG Patient will ambulate up and down stairs w/assist (PT) ?Description: LTG: Patient will ambulate up and down # of stairs with assistance (PT) ?Outcome: Not Applicable ?Flowsheets (Taken 11/25/2021 0549) ?LTG: Pt will  ambulate up and down number of stairs: (goal d/c'ed) -- ?Note: Goal d/c for safety. Family will bump pt up steps to house in [facility] ?  ?

## 2021-11-25 NOTE — Plan of Care (Signed)
?  Problem: RH Wheelchair Mobility ?Goal: LTG Patient will propel w/c in controlled environment (PT) ?Description: LTG: Patient will propel wheelchair in controlled environment, # of feet with assist (PT) ?Flowsheets ?Taken 11/25/2021 0551 ?LTG: Propel w/c distance in controlled environment: 114ft ?Taken 11/03/2021 0720 ?LTG: Pt will propel w/c in controlled environ  assist needed:: Supervision/Verbal cueing ?  ?

## 2021-11-26 MED ORDER — ATORVASTATIN CALCIUM 40 MG PO TABS: 40.0000 mg | ORAL_TABLET | Freq: Every day | ORAL | 0 refills | Status: DC

## 2021-11-26 MED ORDER — DICLOFENAC SODIUM 1 % EX GEL: 2.0000 g | Freq: Four times a day (QID) | CUTANEOUS | 2 refills | Status: DC

## 2021-11-26 MED ORDER — LISINOPRIL 2.5 MG PO TABS: 2.5000 mg | ORAL_TABLET | Freq: Every day | ORAL | 0 refills | Status: DC

## 2021-11-26 MED ORDER — TRAMADOL HCL 50 MG PO TABS: 25.0000 mg | ORAL_TABLET | Freq: Four times a day (QID) | ORAL | 0 refills | Status: DC | PRN

## 2021-11-26 MED ORDER — LINACLOTIDE 290 MCG PO CAPS: 290.0000 ug | ORAL_CAPSULE | Freq: Every day | ORAL | 0 refills | Status: DC

## 2021-11-26 MED ORDER — CLOPIDOGREL BISULFATE 75 MG PO TABS: ORAL_TABLET | ORAL | 2 refills | Status: DC

## 2021-11-26 MED ORDER — TAMSULOSIN HCL 0.4 MG PO CAPS: 0.4000 mg | ORAL_CAPSULE | Freq: Every day | ORAL | 0 refills | Status: DC

## 2021-11-26 MED ORDER — SENNOSIDES-DOCUSATE SODIUM 8.6-50 MG PO TABS: 1.0000 | ORAL_TABLET | Freq: Two times a day (BID) | ORAL | Status: DC

## 2021-11-26 MED ORDER — MELATONIN 5 MG PO TABS: 5.0000 mg | ORAL_TABLET | Freq: Every day | ORAL | 0 refills | Status: DC

## 2021-11-26 MED ORDER — POLYETHYLENE GLYCOL 3350 17 G PO PACK: 17.0000 g | PACK | Freq: Every day | ORAL | 0 refills | Status: DC

## 2021-11-26 NOTE — Progress Notes (Addendum)
Patient ID: Douglas Pruitt, male   DOB: December 28, 1950, 71 y.o.   MRN: [id number] ? ?This SW covering for primary SW, Erlene Quan.  ? ?SW received updates from thrapy team, that family was considering SNF placement due to renovations in the home.  ? ?SW spoke with pt dtr [name] [phone]) who reports renovations are being completed inside the home, and the additional space for her father will not be completed until June/July. SW explained SNF placement process, and insurance will decide how long he is able to remain in the  nursing home. SW will bring SNF list to room for review ? ?SW sent out SNF referral.  ? ?NCPASSR# [id number] ? ?*SW met with pt dtr to provide SNF list. SW explained again SNF placement process, coverage with Medicare and Medicaid, and how insurance decides his length of stay once he arrives at [facility]. She intends to talk things over with her husband, and could also possibly decide to take him home tomorrow as well. She is aware Margreta Journey will f/u. SW updated medical team. SW updated Cheryl[facility] on likelihood of SNF. ? ?Loralee Pacas, MSW, LCSWA ?Office: [phone] ?Cell: [phone] ?Fax: [phone]  ? ? ?

## 2021-11-26 NOTE — Progress Notes (Signed)
?                                                       PROGRESS NOTE ? ? ?Subjective/Complaints: ? ?Daughter at bedside and is considering SNF for patients due to renovations at the home. Social work to discuss this options with patient and family today. No other concerns or complaints.  ? ?Review of Systems  ?Constitutional:  Negative for chills and fever.  ?Respiratory:  Negative for cough and shortness of breath.   ?Gastrointestinal:  Negative for abdominal pain, diarrhea, nausea and vomiting.   ? ? ?Objective: ?  ?No results found. ?No results for input(s): WBC, HGB, HCT, PLT in the last 72 hours. ? ?No results for input(s): NA, K, CL, CO2, GLUCOSE, BUN, CREATININE, CALCIUM in the last 72 hours. ? ? ?Intake/Output Summary (Last 24 hours) at 11/26/2021 0802 ?Last data filed at 11/25/2021 2200 ?Gross per 24 hour  ?Intake 780 ml  ?Output 125 ml  ?Net 655 ml  ?  ? ?  ? ?Physical Exam: ? ? ?General: NAD ?Mood and affect are appropriate, pleasant ?Heart: Regular rate and rhythm no rubs murmurs or extra sounds ?Lungs: Clear to auscultation, breathing unlabored, no rales or wheezes ?Abdomen: Positive bowel sounds, soft nontender to palpation, nondistended ?Extremities: No clubbing, cyanosis, or edema ?Skin: No evidence of breakdown, no evidence of rash, warm and dry skin ?Neurologic: Cranial nerves II through XII grossly  intact, follows commands, motor strength is 5/5 in right and 0/5 left deltoid, bicep, tricep, grip, hip flexor, knee extensors, ankle dorsiflexor and plantar flexor ?Musculoskeletal: Full range of motion in all 4 extremities.  ?No pain with shoulder ROM ? ?Vital Signs ?Blood pressure 125/63, pulse (!) 50, temperature 98 ?F (36.7 ?C), resp. rate 17, height 5\' 4"  (1.626 m), weight 67.1 kg, SpO2 100 %. ? ? ? ?Assessment/Plan: ?1. Functional deficits which require 3+ hours per day of interdisciplinary therapy in a comprehensive inpatient rehab setting. ?Physiatrist is providing close team supervision and 24  hour management of active medical problems listed below. ?Physiatrist and rehab team continue to assess barriers to discharge/monitor patient progress toward functional and medical goals ? ?Care Tool: ? ?Bathing ? Bathing activity did not occur: Safety/medical concerns ?Body parts bathed by patient: Left arm, Chest, Abdomen, Front perineal area, Right upper leg, Left upper leg, Face  ? Body parts bathed by helper: Left lower leg, Buttocks, Right arm, Right lower leg ?  ?  ?Bathing assist Assist Level: Moderate Assistance - Patient 50 - 74% ?  ?  ?Upper Body Dressing/Undressing ?Upper body dressing Upper body dressing/undressing activity did not occur (including orthotics): Safety/medical concerns ?What is the patient wearing?: Pull over shirt ?   ?Upper body assist Assist Level: Minimal Assistance - Patient > 75% ?   ?Lower Body Dressing/Undressing ?Lower body dressing ? ? ? Lower body dressing activity did not occur: Safety/medical concerns ?What is the patient wearing?: Pants, Incontinence brief ? ?  ? ?Lower body assist Assist for lower body dressing: Maximal Assistance - Patient 25 - 49% ?   ? ?Toileting ?Toileting Toileting Activity did not occur ( and hygiene only): N/A (no void or bm)  ?Toileting assist Assist for toileting: Total Assistance - Patient < 25% ?  ?  ?Transfers ?Chair/bed transfer ? ?Transfers assist ? Chair/bed transfer  activity did not occur: Safety/medical concerns ? ?Chair/bed transfer assist level: Moderate Assistance - Patient 50 - 74% ?  ?  ?Locomotion ?Ambulation ? ? ?Ambulation assist ? ? Ambulation activity did not occur: Safety/medical concerns ? ?Assist level: 2 helpers ?Assistive device: Other (comment) (rail in hall) ?Max distance: 15  ? ?Walk 10 feet activity ? ? ?Assist ?   ? ?Assist level: 2 helpers ?Assistive device: Other (comment)  ? ?Walk 50 feet activity ? ? ?Assist Walk 50 feet with 2 turns activity did not occur: Safety/medical concerns ? ?  ?    ? ? ?Walk 150 feet activity ? ? ?Assist Walk 150 feet activity did not occur: Safety/medical concerns ? ?  ?  ?  ? ?Walk 10 feet on uneven surface  ?activity ? ? ?Assist Walk 10 feet on uneven surfaces activity did not occur: Safety/medical concerns ? ? ?  ?   ? ?Wheelchair ? ? ? ? ?Assist Is the patient using a wheelchair?: Yes ?Type of Wheelchair: Manual ?  ? ?Wheelchair assist level: Dependent - Patient 0% ?Max wheelchair distance: 150  ? ? ?Wheelchair 50 feet with 2 turns activity ? ? ? ?Assist ? ?  ?  ? ? ?Assist Level: Dependent - Patient 0%  ? ?Wheelchair 150 feet activity  ? ? ? ?Assist ?   ? ? ?Assist Level: Dependent - Patient 0%  ? ?Blood pressure 125/63, pulse (!) 50, temperature 98 ?F (36.7 ?C), resp. rate 17, height 5\' 4"  (1.626 m), weight 67.1 kg, SpO2 100 %. ? ?Medical Problem List and Plan: ?1. 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            - Patient may shower ?- ELOS/Goals: Supervision to min A 11/27/2021 ?- Continue CIR PT, SLP,  ?-Discharge tomorrow. Social work to discuss options, Family considering SNF vs home discharge ?2.  Antithrombotics: ?-DVT/anticoagulation: Pharmaceutical: Lovenox ?            -antiplatelet therapy: Aspirin 81 mg daily and Plavix 71 mg daily x3 months then aspirin alone. ?3. Pain Management: Tylenol as needed,  ?Pt on low dose prn tramadol which pt appears to tolerate without drowsiness, per family request try acetaminophen first ?Off gabapentin due to drowsiness ?D/c muscle rub due to irritation, will trial voltaren gel ?4. Mood: Provide emotional support ?- Antipsychotic agents: N/A ?-Reports mood is good overall ?5. Neuropsych: This patient is capable of making decisions on his own behalf. ?6. Skin/Wound Care: Routine skin checks ?7. Fluids/Electrolytes/Nutrition: Routine in and outs with follow-up  chemistries ?8.  Hypertension.   ? ? ?  11/26/2021  ?  3:57 AM 11/25/2021  ?  7:38 PM 11/25/2021  ?  2:14 PM  ?Vitals with BMI  ?Systolic 125 114 [id number]  ?Diastolic 63 67 56  ?Pulse 50 62 57  ? 5/1 BP in good range  ?9.  Hyperlipidemia continue Lipitor tablet 40 mg at bedtime daily. ?10.  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  Follow-up as outpatient. ?12.  Urinary hesitation-PVRS/bladder scans to make sure patient's not retaining.  Had mildly elevated BCs urine culture reviewed showed no growth WBCs now are within normal limits. ?Had a low grade temp x 1 with mild leukocytosis Recheck UA, essentially neg  ?No recorded I/O cath in several days cont flomax , last bladder scan normal at 91ml  ?13.  Insomnia:  Continue melatonin 3 mg HS ?14.  Bradycardia: mild asymptomatic , EKG on 4/5 sinus brady arate 59 bpm, no other abnormalities ?Not on beta blocker  ?-HR around 60, continue to monitor ? ?  ?LOS: ?25 days ?A FACE TO FACE EVALUATION WAS PERFORMED ? ?Fanny Dance, MD ?11/26/2021, 8:02 AM  ? ? ? ?

## 2021-11-26 NOTE — Progress Notes (Signed)
Physical Therapy Session Note ? ?Patient Details  ?Name: Douglas Pruitt ?MRN: [id number] ?Date of Birth: 10-15-1950 ? ?Today's Date: 11/26/2021 ?PT Individual Time: 5170-0174 ?PT Individual Time Calculation (min): 58 min  ? ?Short Term Goals: ?Week 2:  PT Short Term Goal 1 (Week 2): Pt will transfer to and from [facility] with min assist ?PT Short Term Goal 1 - Progress (Week 2): Met ?PT Short Term Goal 2 (Week 2): Pt will ambulate 91f with max assist of 1. ?PT Short Term Goal 2 - Progress (Week 2): Progressing toward goal ?PT Short Term Goal 3 (Week 2): Pt will ascend 4 steps wth max assist with 1 UE support ?PT Short Term Goal 3 - Progress (Week 2): Met ?PT Short Term Goal 4 (Week 2): Pt will perform bed mobility with consistent MinA. ?PT Short Term Goal 4 - Progress (Week 2): Met ?Week 3:  PT Short Term Goal 1 (Week 3): STG=LTG due to ELOS ? ? ?Skilled Therapeutic Interventions/Progress Updates:  ?Patient supine in bed on entrance to room. Patient alert and agreeable to PT session.  ? ?Patient with no pain complaint throughout session. ? ?Therapeutic Activity: ?Bed Mobility: Patient performed supine <> sit with supervision. VC/ tc required for technique and safety in reaching EOB using bedrail. Pants and shoes donned with Thusane Sprystep Max AFO to LLE.  ?Transfers: Patient performs squat pivot transfer to R side with CGA. Sit<>[facility] and minimal guard required with AFO donned to LLE. VC for upright posture with improved knee extension but continues to demo ER at hip. Stand pivot transfer to L for car transfer practice requires Min/ ModA for technique in pivot stepping and HW mgmt. Provided verbal cues throughout session for all technique. ? ?Gait Training:  ?Patient ambulated 15 ft using HW with ModA. Demonstrated need for guard to L knee with stance phase. Minimal ability to advance LLE and requires Mod/ max A to advance with good neutral foot placement ad hip alignment. Provided vc/ tc for weight shift and  technique throughout. ? ?Neuromuscular Re-ed: ?NMR facilitated during session with focus on standing balance and LLE positioning/ neutral alignment with AFO donned. Unable to position into neutral LE position requiring ModA for knee extension. Pt will require continued stretch to L hamstrings and modalities to decrease tone in hamstrings with LE activation. NMR performed for improvements in motor control and coordination, balance, sequencing, judgement, and self confidence/ efficacy in performing all aspects of mobility at highest level of independence.  ? ?Patient supine  in bed at end of session with brakes locked, bed alarm set, and all needs within reach. Education provided to pt re: nature of SNF and pt's dtr requesting pt to go for short term rehab and not to stay in nursing home. Pt initially emotionally labile with mention from interpreter re: "nursing home". Pt feeling better with explanation of facility. Reminder that dtr is working on renovations at home in order to best prep for pt's return. Anti-itch lotion applied to pt's back on request.  ? ? ?Therapy Documentation ?Precautions:  ?Precautions ?Precautions: Fall ?Precaution Comments: L eye blind, L hemiplegia ?Restrictions ?Weight Bearing Restrictions: No ?General: ?  ?Vital Signs: ?  ?Pain: ? No pain complaint this session.  ? ?Therapy/Group: Individual Therapy ? ?JAlger SimonsPT, DPT ?11/26/2021, 6:07 PM  ?

## 2021-11-26 NOTE — Progress Notes (Signed)
Physical Therapy Discharge Summary ? ?Patient Details  ?Name: Douglas Pruitt ?MRN: [id number] ?Date of Birth: [birth year] ? ?Today's Date: 11/27/2021 ? ? ? ?Patient has met 6 of 8 long term goals due to improved activity tolerance, improved balance, improved postural control, increased strength, decreased pain, ability to compensate for deficits, improved attention, improved awareness, and improved coordination.  Patient to discharge at a wheelchair level Min Assist.   Patient's care partner is independent to provide the necessary physical assistance at discharge. ? ?Reasons goals not met: Pt d/c location changed to SNF for further skilled therapy.  ? ?Recommendation:  ?Patient will benefit from ongoing skilled PT services in skilled nursing facility setting to continue to advance safe functional mobility, address ongoing impairments in strength, coordination, balance, activity tolerance, cognition, safety awareness, and minimize fall risk. ? ?Equipment: ?HW, AFO, hemiheight wheelchair  ? ?Reasons for discharge: treatment goals met and discharge from hospital ? ?Patient/family agrees with progress made and goals achieved: Yes ? ?PT Discharge ?Precautions/Restrictions ?Precautions ?Precautions: Fall ?Precaution Comments: L eye blind, L hemipareisis ?Restrictions ?Weight Bearing Restrictions: No ?Vital Signs ?  ?Pain ?Pain Assessment ?Pain Scale: 0-10 ?Pain Score: 0-No pain ?Faces Pain Scale: No hurt ?Pain Interference ?Pain Interference ?Pain Effect on Sleep: 2. Occasionally ?Pain Interference with Therapy Activities: 2. Occasionally ?Pain Interference with Day-to-Day Activities: 2. Occasionally ?Vision/Perception  ?Vision - History ?Ability to See in Adequate Light: 2 Moderately impaired ?Vision - Assessment ?Eye Alignment: Impaired (comment) ?Ocular Range of Motion: Restricted on the left ?Alignment/Gaze Preference: Gaze right ?Tracking/Visual Pursuits: Requires cues, head turns, or add eye shifts to track;Impaired - to be  further tested in functional context ?Saccades: Decreased speed of saccadic movement;Additional head turns occurred during testing ?Convergence: Impaired (comment) ?Diplopia Assessment: Only with right gaze;Present in primary gaze ?Perception ?Perception: Impaired ?Inattention/Neglect: Does not attend to left visual field ?Praxis ?Praxis: Impaired  ?Cognition ?Overall Cognitive Status: Impaired/Different from baseline ?Arousal/Alertness: Awake/alert ?Orientation Level: Oriented to person;Oriented to place;Oriented to situation ?Attention: Sustained ?Focused Attention: Appears intact ?Sustained Attention: Impaired ?Memory: Impaired ?Awareness: Impaired ?Problem Solving: Impaired ?Behaviors: Impulsive ?Safety/Judgment: Impaired ?Comments: impulsive with mobility, occasional motor and verbal perseverations ?Sensation ?Sensation ?Light Touch: Impaired Detail ?Peripheral sensation comments: unable to appreciate light touch to determine toe pressure, decreased sensation L to R ?Light Touch Impaired Details: Impaired LUE;Impaired LLE ?Proprioception: Impaired Detail ?Proprioception Impaired Details: Impaired LUE;Impaired LLE ?Additional Comments: L hemipareisis ?Coordination ?Gross Motor Movements are Fluid and Coordinated: No ?Fine Motor Movements are Fluid and Coordinated: No ?Coordination and Movement Description: L hemipareisis UE>LE ?Heel Shin Test: unable to complete with L ?Motor  ?Motor ?Motor: Other (comment);Hemiplegia;Abnormal tone (hemipareisis) ?Motor - Discharge Observations: L hemiplegia in UE and hemipareisis in LE  ?Mobility ?Bed Mobility ?Bed Mobility: Supine to Sit;Sit to Supine;Rolling Right;Rolling Left ?Rolling Right: Supervision/verbal cueing ?Rolling Left: Supervision/Verbal cueing ?Supine to Sit: Supervision/Verbal cueing ?Sit to Supine: Contact Guard/Touching assist ?Transfers ?Transfers: Sit to Stand;Stand to Sit;Squat Pivot Transfers;Stand Pivot Transfers ?Sit to Stand: Contact Guard/Touching  assist;Minimal Assistance - Patient > 75% ?Stand to Sit: Contact Guard/Touching assist;Minimal Assistance - Patient > 75% ?Stand Pivot Transfers: Moderate Assistance - Patient 50 - 74% ?Squat Pivot Transfers: Minimal Assistance - Patient > 75%;Moderate Assistance - Patient 50-74% ?Transfer (Assistive device): Hemi-walker ?Locomotion  ?Gait ?Ambulation: Yes ?Gait Assistance: 2 Helpers;Moderate Assistance - Patient 50-74% ?Assistive device: Other (Comment);Hemi-walker (rail in hall) ?Gait Assistance Details: Visual cues/gestures for sequencing;Verbal cues for sequencing;Verbal cues for technique;Verbal cues for gait pattern;Manual facilitation for weight shifting;Verbal cues for precautions/safety;Verbal  cues for safe use of DME/AE;Tactile cues for weight shifting ?Gait ?Gait: Yes ?Gait Pattern: Impaired ?Gait Pattern: Left flexed knee in stance;Lateral hip instability;Lateral trunk lean to left;Abducted- right ?Gait velocity: decreased ?Stairs / Additional Locomotion ?Stairs: No ?Wheelchair Mobility ?Wheelchair Mobility: Yes ?Wheelchair Assistance: Contact Guard/Touching assist ?Wheelchair Parts Management: Needs assistance  ?Trunk/Postural Assessment  ?Cervical Assessment ?Cervical Assessment: Exceptions to [facility] (head turned R) ?Thoracic Assessment ?Thoracic Assessment: Exceptions to [facility] (rounded shoulders) ?Lumbar Assessment ?Lumbar Assessment: Exceptions to [facility] (post pelvic tilt in sitting) ?Postural Control ?Postural Control: Deficits on evaluation (heavy reliance on RUE support for static standing balance)  ?Balance ?Balance ?Balance Assessed: Yes ?Standardized Balance Assessment ?Standardized Balance Assessment: FIST ?Function in Sitting Test = FIST ?Anterior Nudge: superior sternum: Upper extremity support ?Posterior Nudge: between scapular spines: Verbal cues/increased time ?Lateral Nudge: to dominant side at acromion: Upper extremity support ?Static Sitting: 30 seconds: Independent ?Sitting, Shake "No": left  and right: Independent ?Sitting, Eyes Closed: 30 seconds: Independent ?Sitting, Lift Foot: dominant side, lift foot 1 inch twice: Independent ?Pick Up Object From Behind: object at midline, hands breadth posterior: Verbal cues/increased time ?Forward Reach: use dominant arm, must complete full motion: Verbal cues/increased time ?Lateral Reach: use dominant arm, clear opposite ischial tuberosity: Needs assistance ?Lateral Reach Assistance: Min Assist ?Pick Up Object From Floor: from between feet: Verbal cues/increased time ?Posterior Scooting: move backwards 2 inches: Upper extremity support ?Anterior Scooting: move forward 2 inches: Upper extremity support ?Lateral Scooting: move to dominent side 2 inches: Upper extremity support ?FIST TOTAL SCORE: 39 ?Static Sitting Balance ?Static Sitting - Balance Support: Feet supported ?Static Sitting - Level of Assistance: 5: Stand by assistance ?Dynamic Sitting Balance ?Dynamic Sitting - Balance Support: Feet supported;During functional activity ?Dynamic Sitting - Level of Assistance: 5: Stand by assistance;4: Min assist ?Static Standing Balance ?Static Standing - Level of Assistance: 4: Min assist ?Dynamic Standing Balance ?Dynamic Standing - Balance Support: Right upper extremity supported ?Dynamic Standing - Level of Assistance: 4: Min assist;3: Mod assist ?Extremity Assessment  ?  ?  ?RLE Assessment ?RLE Assessment: Within Functional Limits ?General Strength Comments: grossly 4+/5 to 5/5 ?LLE Assessment ?LLE Assessment: Exceptions to [facility] ?General Strength Comments: hip grossly 3+/5 to 4-/5 with delayed activation; knee grossly and functionally 3-/5 ? ? ?Loel Dubonnet ?11/26/2021, 6:07 PM ?

## 2021-11-26 NOTE — Progress Notes (Signed)
Occupational Therapy Session Note ? ?Patient Details  ?Name: Douglas Pruitt ?MRN: [id number] ?Date of Birth: [birth year] ? ?Today's Date: 11/26/2021 ?OT Individual Time: 0905-1000 ?OT Individual Time Calculation (min): 55 min  ? ? ?Short Term Goals: ?Week 3:  OT Short Term Goal 1 (Week 3): Pt will complete standing grooming task with mod A. ?OT Short Term Goal 2 (Week 3): Pt will pull down pants during toileting with mod A for balance. ?OT Short Term Goal 3 (Week 3): Pt will complete squat-pivot transfer to droparm 3in1 with mod A. ?OT Short Term Goal 4 (Week 3): Pt will verbalize >2 safe positioning strategies for LUE. ? ?Skilled Therapeutic Interventions/Progress Updates:  ?Pt greeted supine in bed with daughter present acting as interpreter. Daughter reports that she now wants pt to DC to short term SNF as her home is being renovated and she feels like it would be too much to bring him home at this point. Messaged care team and still awaiting response from SW to see if ST SNF is a possibility.   ?  ?Pt completed supine>sit to R side with MOD A needing assist to maneuver LLE to EOB and elevate trunk into sitting. Pt completed squat pivot to w/c to L side with MODA. Pt transported to toilet from w/c with total A where pt completed additional squat pivot to toilet with MOD A, total A for 3/3 toileting tasks.  ? ?Pt completed squat pivot to TTB with MODA. Pt completed bathing with overall MOD A needing assist to wash RUE, buttock and BLEs. Pt completed squat pivot out of shower to R side with MODA. Pt completed dressing from w/c with MIN A for UB dressing, needing assist to thread RUE and and MAX A for LB dressing to don brief and pants via sit>stand. Pt completed sit>stands during session with HW and MINA with LLE blocked. Pt  left seated in recliner with alarm belt activated and all needs within reach.                  ? ? ?Therapy Documentation ?Precautions:  ?Precautions ?Precautions: Fall ?Precaution Comments: L eye blind,  L hemiplegia ?Restrictions ?Weight Bearing Restrictions: No ? ?Pain: no pain reported during session  ? ? ? ?Therapy/Group: Individual Therapy ? ?Barron Schmid ?11/26/2021, 11:12 AM ?

## 2021-11-26 NOTE — Progress Notes (Signed)
Speech Language Pathology Weekly Progress and Session Note ? ?Patient Details  ?Name: Douglas Pruitt ?MRN: [id number] ?Date of Birth: 1951/05/26 ? ?Beginning of progress report period: November 23, 2021 ?End of progress report period: Nov 26, 2021 ? ?Today's Date: 11/26/2021 ?SLP Individual Time: 0802-0900 ?SLP Individual Time Calculation (min): 58 min ? ?Short Term Goals: ?Week 3: SLP Short Term Goal 1 (Week 3): Patient will consume current diet without overt s/s of aspiration with use of swallowing compensatory strategies at mod I level ?SLP Short Term Goal 1 - Progress (Week 3): Met ?SLP Short Term Goal 2 (Week 3): Patient will scan to left field of enviornment during functional tasks with min-to-mod A verbal and visual cues. ?SLP Short Term Goal 2 - Progress (Week 3): Met ?SLP Short Term Goal 3 (Week 3): Patient will demonstrate functional problem solving for functional and familiar tasks with Min-to-mod A verbal cues. ?SLP Short Term Goal 3 - Progress (Week 3): Met ?SLP Short Term Goal 4 (Week 3): [name] will recall new, daily information with min-to-mod A verbal cues. ?SLP Short Term Goal 4 - Progress (Week 3): Met ? ?  ?New Short Term Goals: ?Week 4: SLP Short Term Goal 1 (Week 4): STG=LTG due to extened ELOS (home verse SNF.) ? ?Weekly Progress Updates: Patient has made relatively slow but functional progress and has met 4 of 4 goals this admission due to improved implementation of swallowing compensatory strategies, speech intelligibility, basic problem solving, and intellectual/emergent awareness.Patient is currently consuming a dysphagia 2 diet and thin liquids with sup A cues to monitor and clear left anterior spillage and assess for infrequent pocketing in left buccal cavity. After speaking with daughter, pt consumed consistencies similar to dys 2 diet at baseline d/t edentulous status. Patient and family education is complete and patient to discharge at overall min-to-mod A level from a cognitive perspective, and  sup A for swallowing and during meals. Patient's care partner is independent to provide the necessary physical and cognitive assistance at discharge. Patient would benefit from continued SLP services in home health setting/SNF to maximize cognitive function and functional independence.  ?  ? ? ?Intensity: Minumum of 1-2 x/day, 30 to 90 minutes ?Frequency: 3 to 5 out of 7 days ?Duration/Length of Stay: 5/2 verse SNF ?Treatment/Interventions: Cognitive remediation/compensation;Dysphagia/aspiration precaution training;Internal/external aids;Speech/Language facilitation;Therapeutic Activities;Environmental controls;Cueing hierarchy;Functional tasks;Patient/family education ? ? ?Daily Session ? ?Skilled Therapeutic Interventions: Skilled ST services focused on education, swallow and cognitive skills. Interpretor present and pt's daughter arrived the last 15 minutes of the treatment session. Pt was self-feeding dys 3 textures and thin liquid breakfast. Pt demonstrated mod I swallow strategies in clearing oral cavity and consuming small bites/sips. Pt continues to demonstrate mild-moderate left spillage of solids and liquids. SLP facilitated education and anticipatory awareness skills in having pt list acute deficits and ADLs he will need assistance with. SLP facilitated basic problem solving, error awareness and left scanning in 3 step picture card sequence task, pt required min A verbal cues once pt was able to recognize pictures. SLP questions visual deficits. Pt's daughter entered and MD, she requested to possibly change d/c plan to SNF due to home renovations. SLP communicated with social work and left pt/pt's daughter to decided d/c plan. Pt's daughter supports all questions have been answered to satisfaction during education with primary SLP. SLP provided diet handout. Pt was left in room with family, call bell within reach and chair alarm set. SLP recommends to continue skilled services.   ? ?General  ?   ?  Pain ?Pain Assessment ?Pain Score: 0-No pain ? ?Therapy/Group: Individual Therapy ? ?Douglas Pruitt ?11/26/2021, 2:01 PM ? ? ? ? ? ? ?

## 2021-11-26 NOTE — [place] FL2 (Signed)
?[place] MEDICAID FL2 LEVEL OF CARE SCREENING TOOL  ?  ? ?IDENTIFICATION  ?Patient Name: ?Douglas Pruitt Birthdate: 1951-01-05 Sex: male Admission Date (Current Location): ?11/01/2021  ?[place] Number: ? [place] ?[id number] P Facility and Address:  ?[facility] [place], [address] [place], [place] [address] ?     Provider Number: ?[id number]  ?Attending Physician Name and Address:  ?Erick Colace, MD ? Relative Name and Phone Number:  ?[name] [name] (dtr) ?   ?Current Level of Care: ?Hospital Recommended Level of Care: ?Skilled Nursing Facility Prior Approval Number: ?  ? ?Date Approved/Denied: ?  PASRR Number: ?[id number] ? ?Discharge Plan: ?SNF ?  ? ?Current Diagnoses: ?Patient Active Problem List  ? Diagnosis Date Noted  ? Right middle cerebral artery stroke (HCC) 11/01/2021  ? Hyponatremia 10/31/2021  ? Hypokalemia 10/31/2021  ? Hyperbilirubinemia 10/31/2021  ? Leukocytosis 10/31/2021  ? Acute CVA (cerebrovascular accident) (HCC) 10/30/2021  ? Stroke-like symptoms 10/30/2021  ? DNR (do not resuscitate) 10/30/2021  ? Carotid stenosis 10/23/2021  ? Stroke (cerebrum) (HCC) 10/22/2021  ? Memory changes 08/23/2019  ? Fatigue 08/23/2019  ? Abnormal glucose 03/11/2019  ? Essential hypertension 09/10/2018  ? Mixed hyperlipidemia 09/10/2018  ? Vision loss, left eye 09/10/2018  ? Aphakia of left eye 05/30/2011  ? 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 ? ?Orientation RESPIRATION BLADDER Height & Weight   ?  ?Self, Time, Situation, Place ? Normal Incontinent Weight: 147 lb 14.9 oz (67.1 kg) ?Height:  5\' 4"  (162.6 cm)  ?BEHAVIORAL SYMPTOMS/MOOD NEUROLOGICAL BOWEL NUTRITION STATUS  ?    Incontinent Diet (D2 thin)  ?AMBULATORY STATUS COMMUNICATION OF NEEDS Skin   ?Limited Assist Verbally Normal ?  ?  ?  ?    ?     ?     ? ? ?Personal Care Assistance Level of Assistance  ?Bathing, Dressing Bathing Assistance: Limited assistance ?  ?Dressing Assistance: Limited assistance ?   ? ?Functional Limitations  Info  ?Sight, Speech, Hearing Sight Info: Impaired (blind in left eye) ?Hearing Info: Adequate ?Speech Info: Adequate  ? ? ?SPECIAL CARE FACTORS FREQUENCY  ?PT (By licensed PT), OT (By licensed OT), Speech therapy   ?  ?PT Frequency: 5xs per week ?OT Frequency: 5xs per week ?  ?  ?Speech Therapy Frequency: 5xs per week ?   ? ? ?Contractures Contractures Info: Not present  ? ? ?Additional Factors Info  ?Code Status, Allergies Code Status Info: DNR ?Allergies Info: NKA ?  ?  ?  ?   ? ?Current Medications (11/26/2021):  This is the current hospital active medication list ?Current Facility-Administered Medications  ?Medication Dose Route Frequency Provider Last Rate Last Admin  ? acetaminophen (TYLENOL) tablet 650 mg  650 mg Oral Q4H PRN Trentin, Sanda, PA-C   650 mg at 11/22/21 2021  ? Or  ? acetaminophen (TYLENOL) 160 MG/5ML solution 650 mg  650 mg Per Tube Q4H PRN Angiulli, Mcarthur Rossetti, PA-C      ? Or  ? acetaminophen (TYLENOL) suppository 650 mg  650 mg Rectal Q4H PRN Chano, Rinne, PA-C   650 mg at 11/11/21 1416  ? aspirin EC tablet 81 mg  81 mg Oral Daily Otha, Kellner, PA-C   81 mg at 11/26/21 4034  ? atorvastatin (LIPITOR) tablet 40 mg  40 mg Oral QHS Griezmann, Kulczyk, PA-C   40 mg at 11/25/21 2104  ? benzocaine (ORAJEL) 10 % mucosal gel   Mouth/Throat TID PRN Charlton Amor, PA-C  Given at 11/12/21 1703  ? camphor-menthol (SARNA) lotion   Topical PRN Tressia Miners, FNP   Given at 11/25/21 1632  ? clopidogrel (PLAVIX) tablet 75 mg  75 mg Oral Daily Reome, Earle J, RPH   75 mg at 11/26/21 0753  ? diclofenac Sodium (VOLTAREN) 1 % topical gel 2 g  2 g Topical QID Erick Colace, MD   2 g at 11/25/21 1610  ? enoxaparin (LOVENOX) injection 40 mg  40 mg Subcutaneous Q24H Jahcere, Derr, PA-C   40 mg at 11/25/21 1738  ? feeding supplement (ENSURE ENLIVE / ENSURE PLUS) liquid 237 mL  237 mL Oral BID BM Lucia, Schroeter, PA-C   237 mL at 11/25/21 1151  ? linaclotide (LINZESS) capsule 290 mcg  290  mcg Oral QAC breakfast Felipe, Voytko, PA-C   290 mcg at 11/26/21 9604  ? lisinopril (ZESTRIL) tablet 2.5 mg  2.5 mg Oral Daily Kirsteins, Victorino Sparrow, MD   2.5 mg at 11/26/21 0753  ? melatonin tablet 5 mg  5 mg Oral QHS Kirsteins, Victorino Sparrow, MD   5 mg at 11/25/21 2216  ? nystatin (MYCOSTATIN) 100000 UNIT/ML suspension 500,000 Units  5 mL Oral QID Henrick, Melber, PA-C   500,000 Units at 11/26/21 1202  ? polyethylene glycol (MIRALAX / GLYCOLAX) packet 17 g  17 g Oral Daily Francesca, Vos, PA-C   17 g at 11/25/21 5409  ? senna-docusate (Senokot-S) tablet 1 tablet  1 tablet Oral BID Lucious, Artrip, PA-C   1 tablet at 11/26/21 8119  ? sorbitol 70 % solution 60 mL  60 mL Oral Daily PRN Kirsteins, Victorino Sparrow, MD      ? tamsulosin [facility]) capsule 0.4 mg  0.4 mg Oral QPC supper Erick Colace, MD   0.4 mg at 11/25/21 1739  ? traMADol (ULTRAM) tablet 25 mg  25 mg Oral Q6H Tressia Miners, FNP   25 mg at 11/25/21 1739  ? traZODone (DESYREL) tablet 50 mg  50 mg Oral QHS PRN Vineeth, Dalmau, PA-C   50 mg at 11/25/21 2103  ? ? ? ?Discharge Medications: ?Please see discharge summary for a list of discharge medications. ? ?Relevant Imaging Results: ? ?Relevant Lab Results: ? ? ?Additional Information ?SS# [id number] ? ?Gretchen Short, LCSW ? ? ? ? ?

## 2021-11-26 NOTE — Progress Notes (Signed)
Inpatient Rehabilitation Discharge Medication Review by a Pharmacist ? ?A complete drug regimen review was completed for this patient to identify any potential clinically significant medication issues. ? ?High Risk Drug Classes Is patient taking? Indication by Medication  ?Antipsychotic No   ?Anticoagulant No   ?Antibiotic No   ?Opioid Yes Tramadol - pain  ?Antiplatelet Yes Aspirin 81 mg and Plavix 75 mg - stroke; Plavix to stop after 90 days.  ?Hypoglycemics/insulin No   ?Vasoactive Medication Yes Lisinopril - HTN  ?Chemotherapy No   ?Other Yes Atorvastain - HLD ?Lisinopril - HTN ?Tamsulosin - urinary hesitancy ?Melatonin - sleep ?Linzess - constipation ?Senokot s, miralax - laxatives ?Diclofenac gel - pain  ? ? ? ?Type of Medication Issue Identified Description of Issue Recommendation(s)  ?Drug Interaction(s) (clinically significant) ?    ?Duplicate Therapy ?    ?Allergy ?    ?No Medication Administration End Date ?    ?Incorrect Dose ?    ?Additional Drug Therapy Needed ?    ?Significant med changes from prior encounter (inform family/care partners about these prior to discharge).    ?Other ? Amlodipine stopped on 11/18/21 and staying off. ?Nystatin oral stopping at discharge.   ? ? ?Clinically significant medication issues were identified that warrant physician communication and completion of prescribed/recommended actions by midnight of the next day:  No ? ?Time spent performing this drug regimen review (minutes):  25 ? ?Arty Baumgartner, RPh ?11/26/2021 4:50 PM ?

## 2021-11-27 LAB — SARS CORONAVIRUS 2 (TAT 6-24 HRS): SARS Coronavirus 2: NEGATIVE

## 2021-11-27 MED ORDER — TRAMADOL HCL 50 MG PO TABS: 25.0000 mg | ORAL_TABLET | Freq: Four times a day (QID) | ORAL | 0 refills | Status: DC | PRN

## 2021-11-27 NOTE — Progress Notes (Signed)
?                                                       PROGRESS NOTE ? ? ?Subjective/Complaints: ? ?No pain c/os! ?Plan is for SNF ? ?Review of Systems  ?Constitutional:  Negative for chills and fever.  ?Respiratory:  Negative for cough and shortness of breath.   ?Gastrointestinal:  Negative for abdominal pain, diarrhea, nausea and vomiting.   ? ? ?Objective: ?  ?No results found. ?No results for input(s): WBC, HGB, HCT, PLT in the last 72 hours. ? ?No results for input(s): NA, K, CL, CO2, GLUCOSE, BUN, CREATININE, CALCIUM in the last 72 hours. ? ? ?Intake/Output Summary (Last 24 hours) at 11/27/2021 [id number] ?Last data filed at 11/27/2021 0100 ?Gross per 24 hour  ?Intake 297 ml  ?Output 1300 ml  ?Net -1003 ml  ? ?  ? ?  ? ?Physical Exam: ? ? ?General: NAD ?Mood and affect are appropriate, pleasant ?Heart: Regular rate and rhythm no rubs murmurs or extra sounds ?Lungs: Clear to auscultation, breathing unlabored, no rales or wheezes ?Abdomen: Positive bowel sounds, soft nontender to palpation, nondistended ?Extremities: No clubbing, cyanosis, or edema ?Skin: No evidence of breakdown, no evidence of rash, warm and dry skin ?Neurologic: Cranial nerves II through XII grossly  intact, follows commands, motor strength is 5/5 in right and 0/5 left deltoid, bicep, tricep, grip, hip flexor, knee extensors, ankle dorsiflexor and plantar flexor ?Musculoskeletal: Full range of motion in all 4 extremities.  ?No pain with shoulder ROM ? ?Vital Signs ?Blood pressure 130/72, pulse 72, temperature 98.7 ?F (37.1 ?C), temperature source Oral, resp. rate 14, height 5\' 4"  (1.626 m), weight 67.1 kg, SpO2 98 %. ? ? ? ?Assessment/Plan: ?1. Functional deficits which require 3+ hours per day of interdisciplinary therapy in a comprehensive inpatient rehab setting. ?Physiatrist is providing close team supervision and 24 hour management of active medical problems listed below. ?Physiatrist and rehab team continue to assess barriers to  discharge/monitor patient progress toward functional and medical goals ? ?Care Tool: ? ?Bathing ? Bathing activity did not occur: Safety/medical concerns ?Body parts bathed by patient: Left arm, Chest, Abdomen, Front perineal area, Right upper leg, Left upper leg, Face  ? Body parts bathed by helper: Left lower leg, Buttocks, Right arm, Right lower leg ?  ?  ?Bathing assist Assist Level: Moderate Assistance - Patient 50 - 74% ?  ?  ?Upper Body Dressing/Undressing ?Upper body dressing Upper body dressing/undressing activity did not occur (including orthotics): Safety/medical concerns ?What is the patient wearing?: Pull over shirt ?   ?Upper body assist Assist Level: Minimal Assistance - Patient > 75% ?   ?Lower Body Dressing/Undressing ?Lower body dressing ? ? ? Lower body dressing activity did not occur: Safety/medical concerns ?What is the patient wearing?: Pants, Incontinence brief ? ?  ? ?Lower body assist Assist for lower body dressing: Maximal Assistance - Patient 25 - 49% ?   ? ?Toileting ?Toileting Toileting Activity did not occur (Probation officer and hygiene only): N/A (no void or bm)  ?Toileting assist Assist for toileting: Total Assistance - Patient < 25% ?  ?  ?Transfers ?Chair/bed transfer ? ?Transfers assist ? Chair/bed transfer activity did not occur: Safety/medical concerns ? ?Chair/bed transfer assist level: Moderate Assistance - Patient 50 - 74% ?  ?  ?  Locomotion ?Ambulation ? ? ?Ambulation assist ? ? Ambulation activity did not occur: Safety/medical concerns ? ?Assist level: 2 helpers ?Assistive device: Other (comment) (rail in hall) ?Max distance: 15  ? ?Walk 10 feet activity ? ? ?Assist ?   ? ?Assist level: 2 helpers ?Assistive device: Other (comment)  ? ?Walk 50 feet activity ? ? ?Assist Walk 50 feet with 2 turns activity did not occur: Safety/medical concerns ? ?  ?   ? ? ?Walk 150 feet activity ? ? ?Assist Walk 150 feet activity did not occur: Safety/medical concerns ? ?  ?  ?  ? ?Walk 10  feet on uneven surface  ?activity ? ? ?Assist Walk 10 feet on uneven surfaces activity did not occur: Safety/medical concerns ? ? ?  ?   ? ?Wheelchair ? ? ? ? ?Assist Is the patient using a wheelchair?: Yes ?Type of Wheelchair: Manual ?  ? ?Wheelchair assist level: Dependent - Patient 0% ?Max wheelchair distance: 150  ? ? ?Wheelchair 50 feet with 2 turns activity ? ? ? ?Assist ? ?  ?  ? ? ?Assist Level: Dependent - Patient 0%  ? ?Wheelchair 150 feet activity  ? ? ? ?Assist ?   ? ? ?Assist Level: Dependent - Patient 0%  ? ?Blood pressure 130/72, pulse 72, temperature 98.7 ?F (37.1 ?C), temperature source Oral, resp. rate 14, height 5\' 4"  (1.626 m), weight 67.1 kg, SpO2 98 %. ? ?Medical Problem List and Plan: ?1. 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            - Patient may shower ?- ELOS/Goals: Supervision to min A  ?- Continue CIR PT, SLP,  ?-Discharge to  SNF , FL2 complete ?2.  Antithrombotics: ?-DVT/anticoagulation: Pharmaceutical: Lovenox ?            -antiplatelet therapy: Aspirin 81 mg daily and Plavix 71 mg daily x3 months then aspirin alone. ?3. Pain Management: Tylenol as needed,  ?Pt on low dose prn tramadol which pt appears to tolerate without drowsiness, per family request try acetaminophen first ?Off gabapentin due to drowsiness ?D/c muscle rub due to irritation, will trial voltaren gel ?4. Mood: Provide emotional support ?- Antipsychotic agents: N/A ?-Reports mood is good overall ?5. Neuropsych: This patient is capable of making decisions on his own behalf. ?6. Skin/Wound Care: Routine skin checks ?7. Fluids/Electrolytes/Nutrition: Routine in and outs with follow-up chemistries ?8.  Hypertension.   ? ? ?  11/27/2021  ?  5:00 AM 11/26/2021  ?  7:59 PM 11/26/2021  ? 12:55 PM  ?Vitals with BMI  ?Systolic [id number]  ?Diastolic 72 76 69  ?Pulse 72 68 82   ? 5/2 BP in good range  ?9.  Hyperlipidemia continue Lipitor tablet 40 mg at bedtime daily. ?10.  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  Follow-up as outpatient. ?12.  Urinary hesitation-PVRS/bladder scans to make sure patient's not retaining.  Had mildly elevated BCs urine culture reviewed showed no growth WBCs now are within normal limits. ?Had a low grade temp x 1 with mild leukocytosis Recheck UA, essentially neg  ?No recorded I/O cath in several days cont flomax , last bladder scan normal at 46ml  ?13.  Insomnia: Continue melatonin 3 mg HS ?14.  Bradycardia: mild asymptomatic , EKG on 4/5 sinus brady arate 59 bpm, no other abnormalities ?Not on beta blocker  ?-  HR around 60, continue to monitor ? ?  ?LOS: ?26 days ?A FACE TO FACE EVALUATION WAS PERFORMED ? ?Charlett Blake, MD ?11/27/2021, 8:34 AM  ? ? ? ?

## 2021-11-27 NOTE — Progress Notes (Signed)
Patient ID: Douglas Pruitt, male   DOB: 09-06-1950, 71 y.o.   MRN: [id number] ? ?Patient will discharge to SNF tomorrow ?

## 2021-11-27 NOTE — Progress Notes (Signed)
Physical Therapy Session Note ? ?Patient Details  ?Name: Douglas Pruitt ?MRN: [id number] ?Date of Birth: 29-May-1951 ? ?Today's Date: 11/27/2021 ?PT Individual Time:  5916-3846  ?PT Individual Time Calculation (min): 50 min ? ?Short Term Goals: ?Week 2:  PT Short Term Goal 1 (Week 2): Pt will transfer to and from [facility] with min assist ?PT Short Term Goal 1 - Progress (Week 2): Met ?PT Short Term Goal 2 (Week 2): Pt will ambulate 23f with max assist of 1. ?PT Short Term Goal 2 - Progress (Week 2): Progressing toward goal ?PT Short Term Goal 3 (Week 2): Pt will ascend 4 steps wth max assist with 1 UE support ?PT Short Term Goal 3 - Progress (Week 2): Met ?PT Short Term Goal 4 (Week 2): Pt will perform bed mobility with consistent MinA. ?PT Short Term Goal 4 - Progress (Week 2): Met ?Week 3:  PT Short Term Goal 1 (Week 3): STG=LTG due to ELOS ? ? ?Skilled Therapeutic Interventions/Progress Updates:  ?Patient supine in bed on entrance to room. Patient alert and agreeable to PT session.  ? ?Patient with no pain complaint throughout session. ? ?Therapeutic Activity: ?Bed Mobility: Patient performed supine <> sit with supervision. VC/ tc required for reduced impulsivity. ?Transfers: Patient performed squat pivot transfer to R side with light MinA  with setup and use of  bedrail. Sit<>stand transfers withhandrail or STEDY with CGA/ MinA but does require AFO and ModA guard to L knee in order to prevent buckle and L hip ER. Stand pivot MaxA with use of HW for balance, safety and to prevent knee buckle. Provided vc/ tc throughout sessions for technique and upright posture. ? ?Neuromuscular Re-ed: ?NMR facilitated during session with focus on sitting/standing balance and LE muscle activation/ facilitation. Pt guided in FIST with increase in score by 6 points. Pt performs blocked practice of STS with no AFO donned. Pt is able to perform repeated transfers improving to CGA with intermittent need for L knee guard. Pt stands with CGA and no  block to knee with vc for L knee extension. Continues to demo increased tone and cocontraction between quads and HS muscle groups. Minisquats performed with need for intermittent knee guard up to MinA and performs overall with CGA. Significant improvement in L knee activation. Progressed to step forward and backward with LLE and ModA for advancement/ alignment. NMR performed for improvements in motor control and coordination, balance, sequencing, judgement, and self confidence/ efficacy in performing all aspects of mobility at highest level of independence.  ? [facility]F recommended per family request and to continue skilled therapy with hopes for progress and/ or maintenance of current level of mobility.  ? ?Patient supine  in bed at end of session with brakes locked, bed alarm set, and all needs within reach. Anti-itch lotion applied to pt's back per request.  ? ? ?Therapy Documentation ?Precautions:  ?Precautions ?Precautions: Fall ?Precaution Comments: L eye blind, L hemiplegia ?Restrictions ?Weight Bearing Restrictions: No ?General: ?  ?Vital Signs: ?Therapy Vitals ?Temp: 98.5 ?F (36.9 ?C) ?Temp Source: Oral ?Pulse Rate: 70 ?Resp: 18 ?BP: 105/67 ?Patient Position (if appropriate): Lying ?Oxygen Therapy ?SpO2: 100 % ?O2 Device: Room Air ?Pain: ? No pain complaint this session.  ? ?Therapy/Group: Individual Therapy ? ?JAlger SimonsPT, DPT ?11/27/2021, 2:17 PM  ?

## 2021-11-27 NOTE — Progress Notes (Signed)
Patient ID: Douglas Pruitt, male   DOB: Mar 11, 1951, 71 y.o.   MRN: [id number] ? ?Patient PTAR transport scheduled for tomorrow 1PM ?

## 2021-11-27 NOTE — Plan of Care (Signed)
?  Problem: RH Balance ?Goal: LTG: Patient will maintain dynamic sitting balance (OT) ?Description: LTG:  Patient will maintain dynamic sitting balance with assistance during activities of daily living (OT) ?Outcome: Not Met (add Reason) ?Note: Requires up to min A due to poor postural control and dense L hemi. ?  ?Problem: RH Dressing ?Goal: LTG Patient will perform lower body dressing w/assist (OT) ?Description: LTG: Patient will perform lower body dressing with assist, with/without cues in positioning using equipment (OT) ?Outcome: Completed/Met ?  ?Problem: RH Toilet Transfers ?Goal: LTG Patient will perform toilet transfers w/assist (OT) ?Description: LTG: Patient will perform toilet transfers with assist, with/without cues using equipment (OT) ?Outcome: Completed/Met ?  ?Problem: RH Tub/Shower Transfers ?Goal: LTG Patient will perform tub/shower transfers w/assist (OT) ?Description: LTG: Patient will perform tub/shower transfers with assist, with/without cues using equipment (OT) ?Outcome: Completed/Met ?  ?Problem: RH Memory ?Goal: LTG Patient will demonstrate ability for day to day recall/carry over during activities of daily living with assistance level (OT) ?Description: LTG:  Patient will demonstrate ability for day to day recall/carry over during activities of daily living with assistance level (OT). ?Outcome: Completed/Met ?  ?Problem: RH Furniture Transfers ?Goal: LTG Patient will perform furniture transfers w/assist (OT/PT) ?Description: LTG: Patient will perform furniture transfers  with assistance (OT/PT). ?Outcome: Completed/Met ?  ?

## 2021-11-27 NOTE — Progress Notes (Signed)
Inpatient Rehabilitation Care Coordinator ?Discharge Note  ? ?Patient Details  ?Name: Douglas Pruitt ?MRN: [id number] ?Date of Birth: 05-27-1951 ? ? ?Discharge location: SNF [facility]) ? ?Length of Stay: 27 Days ? ?Discharge activity level: Min/Mod ? ?Home/community participation: daughter ? ?Patient response [id number] Literacy - How often do you need to have someone help you when you read instructions, pamphlets, or other written material from your doctor or pharmacy?: Always ? ?Patient response [id number] Isolation - How often do you feel lonely or isolated from those around you?: Never ? ?Services provided included: SW, Pharmacy, TR, RN, CM, SLP, OT, PT, RD, MD ? ?Financial Services:  ?Charity fundraiser Utilized: Medicare ?  ? ?Choices offered to/list presented to: daughter ? ?Follow-up services arranged:  ?  ?   ?  ?  ?  ? ?Patient response to transportation need: ?Is the patient able to respond to transportation needs?: Yes ?In the past 12 months, has lack of transportation kept you from medical appointments or from getting medications?: No ?In the past 12 months, has lack of transportation kept you from meetings, work, or from getting things needed for daily living?: No ? ? ? ?Comments (or additional information): ? ?Patient/Family verbalized understanding of follow-up arrangements:  Yes ? ?Individual responsible for coordination of the follow-up plan: [name] [phone] ? ?Confirmed correct DME delivered: Douglas Pruitt 11/27/2021   ? ?Douglas Pruitt ?

## 2021-11-27 NOTE — Progress Notes (Addendum)
Occupational Therapy Session Note ? ?Patient Details  ?Name: Douglas Pruitt ?MRN: [id number] ?Date of Birth: 07/27/1951 ? ?Today's Date: 11/27/2021 ?OT Individual Time: 9480-1655 ?OT Individual Time Calculation (min): 71 min  ? ? ?Short Term Goals: ?Week 1:  OT Short Term Goal 1 (Week 1): Pt will complete sit to stand at sink with mod A. ?OT Short Term Goal 1 - Progress (Week 1): Met ?OT Short Term Goal 2 (Week 1): Pt will don shirt with min A. ?OT Short Term Goal 2 - Progress (Week 1): Met ?OT Short Term Goal 3 (Week 1): Pt will complete toilet transfer with max A and LRAD. ?OT Short Term Goal 3 - Progress (Week 1): Not met ?OT Short Term Goal 4 (Week 1): Pt will therapeutic position LUE in chair/bed with no more than min VCs. ?OT Short Term Goal 4 - Progress (Week 1): Not met ?Week 2:  OT Short Term Goal 1 (Week 2): Pt will complete toilet transfer with max A and LRAD. ?OT Short Term Goal 1 - Progress (Week 2): Met ?OT Short Term Goal 2 (Week 2): Pt will therapeutic position LUE in chair/bed with no more than min VCs. ?OT Short Term Goal 2 - Progress (Week 2): Not met ?OT Short Term Goal 3 (Week 2): Pt will don shirt with S. ?OT Short Term Goal 3 - Progress (Week 2): Not met ?OT Short Term Goal 4 (Week 2): Pt will don pants with mod A. ?OT Short Term Goal 4 - Progress (Week 2): Not met ?Week 3:  OT Short Term Goal 1 (Week 3): Pt will complete standing grooming task with mod A. ?OT Short Term Goal 2 (Week 3): Pt will pull down pants during toileting with mod A for balance. ?OT Short Term Goal 3 (Week 3): Pt will complete squat-pivot transfer to droparm 3in1 with mod A. ?OT Short Term Goal 4 (Week 3): Pt will verbalize >2 safe positioning strategies for LUE. ? ?Skilled Therapeutic Interventions/Progress Updates:  ?  Pt received semi-reclined in bed, no reports of pain, agreeable to therapy. No interpreter available, but pt able to communicate basic needs in [place]. Session focus on self-care retraining, activity tolerance,  LUE NMR in prep for improved ADL/IADL/func mobility performance + decreased caregiver burden. Came to sitting EOB with min A to progress LLE and with heavy use of bed features. Total A to don B shoes and L AFO. Squat-pivot to his R with min A, much improved impulsivity and pt waiting for therapist cues/assist. Doffed shirt with min A to pull over head and min a to thread LUE when donning new shirt. Total A w/c transport to and from gym. ? ?Pt completed massed practiced of sit to stands with min to mod A to block LLE and maintain balance. Pt perseverating on completing towel slides with RUE and not LUE in standing. Seated and with multimodal cuing, pt able to completed 3x10 forward towel slides.  Pt often confusing therapist's arm for his own LUE. ? ? ?Therapist applied Saebo Stim One NMES to the L digit/wrist extensors/flexors set at the below settings:   ?  ?Saebo Stim One ?Intensity: 13, 11 clicks ?Duration: 5 min, 5 min ?330 pulse width ?35 Hz pulse rate ?On 8 sec/ off 8 sec ?Ramp up/ down 2 sec ?Symmetrical Biphasic wave form ?Max intensity 162m at 500 Ohm load ? ?Pt denies pain and skin in intact. Total A to facilitate functional grasp/release on soft sponge, much improved L attention and not perseverating on  Occupational Therapy Session Note ? ?Patient Details  ?Name: Douglas Pruitt ?MRN: [id number] ?Date of Birth: 07/27/1951 ? ?Today's Date: 11/27/2021 ?OT Individual Time: 9480-1655 ?OT Individual Time Calculation (min): 71 min  ? ? ?Short Term Goals: ?Week 1:  OT Short Term Goal 1 (Week 1): Pt will complete sit to stand at sink with mod A. ?OT Short Term Goal 1 - Progress (Week 1): Met ?OT Short Term Goal 2 (Week 1): Pt will don shirt with min A. ?OT Short Term Goal 2 - Progress (Week 1): Met ?OT Short Term Goal 3 (Week 1): Pt will complete toilet transfer with max A and LRAD. ?OT Short Term Goal 3 - Progress (Week 1): Not met ?OT Short Term Goal 4 (Week 1): Pt will therapeutic position LUE in chair/bed with no more than min VCs. ?OT Short Term Goal 4 - Progress (Week 1): Not met ?Week 2:  OT Short Term Goal 1 (Week 2): Pt will complete toilet transfer with max A and LRAD. ?OT Short Term Goal 1 - Progress (Week 2): Met ?OT Short Term Goal 2 (Week 2): Pt will therapeutic position LUE in chair/bed with no more than min VCs. ?OT Short Term Goal 2 - Progress (Week 2): Not met ?OT Short Term Goal 3 (Week 2): Pt will don shirt with S. ?OT Short Term Goal 3 - Progress (Week 2): Not met ?OT Short Term Goal 4 (Week 2): Pt will don pants with mod A. ?OT Short Term Goal 4 - Progress (Week 2): Not met ?Week 3:  OT Short Term Goal 1 (Week 3): Pt will complete standing grooming task with mod A. ?OT Short Term Goal 2 (Week 3): Pt will pull down pants during toileting with mod A for balance. ?OT Short Term Goal 3 (Week 3): Pt will complete squat-pivot transfer to droparm 3in1 with mod A. ?OT Short Term Goal 4 (Week 3): Pt will verbalize >2 safe positioning strategies for LUE. ? ?Skilled Therapeutic Interventions/Progress Updates:  ?  Pt received semi-reclined in bed, no reports of pain, agreeable to therapy. No interpreter available, but pt able to communicate basic needs in [place]. Session focus on self-care retraining, activity tolerance,  LUE NMR in prep for improved ADL/IADL/func mobility performance + decreased caregiver burden. Came to sitting EOB with min A to progress LLE and with heavy use of bed features. Total A to don B shoes and L AFO. Squat-pivot to his R with min A, much improved impulsivity and pt waiting for therapist cues/assist. Doffed shirt with min A to pull over head and min a to thread LUE when donning new shirt. Total A w/c transport to and from gym. ? ?Pt completed massed practiced of sit to stands with min to mod A to block LLE and maintain balance. Pt perseverating on completing towel slides with RUE and not LUE in standing. Seated and with multimodal cuing, pt able to completed 3x10 forward towel slides.  Pt often confusing therapist's arm for his own LUE. ? ? ?Therapist applied Saebo Stim One NMES to the L digit/wrist extensors/flexors set at the below settings:   ?  ?Saebo Stim One ?Intensity: 13, 11 clicks ?Duration: 5 min, 5 min ?330 pulse width ?35 Hz pulse rate ?On 8 sec/ off 8 sec ?Ramp up/ down 2 sec ?Symmetrical Biphasic wave form ?Max intensity 162m at 500 Ohm load ? ?Pt denies pain and skin in intact. Total A to facilitate functional grasp/release on soft sponge, much improved L attention and not perseverating on

## 2021-11-27 NOTE — Progress Notes (Signed)
Speech Language Pathology Daily Session Note ? ?Patient Details  ?Name: Douglas Pruitt ?MRN: [id number] ?Date of Birth: 12/14/1950 ? ?Today's Date: 11/27/2021 ?SLP Individual Time: [id number] ?SLP Individual Time Calculation (min): 45 min ? ?Short Term Goals: ?Week 4: SLP Short Term Goal 1 (Week 4): STG=LTG due to extened ELOS (home verse SNF.) ? ?Skilled Therapeutic Interventions: ?Pt seen for skilled ST with focus on swallowing and cognitive goals, SLP calling phone translator services to assist with session.  Pt noted to have consumed ~40% AM meal of Dys 2/thin however denying any further intake at this time. SLP repositioning patient in bed and facilitating oral care by providing overall Min A cues for thoroughness. Pt did consume ~2 oz thin liquid via straw with 2x cough after swallow, cough was dry with no change in vocal quality. SLP facilitating simple problem solving via picture cards by providing mod-max A cues to ID problems and min-mod A cues to solve problems. With verbal cues, pt demonstrates emergent awareness of current physical/cognitive impairments, expresses he wants to discharge home and not to SNF. Pt reports daughter is coming at [facility] and they will continue family discussion regarding discharge environment/needs. Pt left in bed with alarm set and MD present for assessment. Cont ST POC.  ? ?Pain ?Pain Assessment ?Pain Scale: 0-10 ?Pain Score: 0-No pain ? ?Therapy/Group: Individual Therapy ? ?Dewaine Conger ?11/27/2021, 8:40 AM ?

## 2021-11-27 NOTE — Progress Notes (Signed)
Patient ID: Douglas Pruitt, male   DOB: [birth year], 71 y.o.   MRN: [id number] ? ?Patient daughter has decided on [facility] for SNF. SW will follow up with AD and confirm a transfer date.  ?

## 2021-11-28 NOTE — Consult Note (Signed)
? ?  [facility] CM Inpatient Consult ? ? ?11/28/2021 ? ?Virgel Dinunzio ?May 26, 1951 ?[id number] ? ?[facility] Customer service manager [THN]  Accountable Care Organization [ACO] Patient: Medicare ACO REACH ? ?Primary Care Provider:  [name], FNP, [facility], is [facility] embedded provider with a Chronic Care Management team and program, and is listed for the transition of care follow up and appointments. ? ?Patient was screened for post inpatient rehabilitation stay transition and patient is currently for a skilled nursing facility level of care noted.  ? ?Plan:  Will alert [facility] PAC RN of disposition for follow up on progress for community needs when appropriate. ? ?Please contact for further questions, ? ?Charlesetta Shanks, RN BSN CCM ?[facility] [employer] Liaison ? [phone] business mobile phone ?Toll free office [phone]  ?Fax number: [phone] ?[email or web address] ?[email or web address] ? ? ? ?

## 2021-11-28 NOTE — Progress Notes (Signed)
Inpatient Rehabilitation Discharge Medication Review by a Pharmacist ? ?A complete drug regimen review was completed for this patient to identify any potential clinically significant medication issues. ? ?High Risk Drug Classes Is patient taking? Indication by Medication  ?Antipsychotic No   ?Anticoagulant No   ?Antibiotic No   ?Opioid Yes Tramadol - pain  ?Antiplatelet Yes Aspirin 81 mg and Plavix 75 mg - stroke; Plavix to stop after 90 days.  ?Hypoglycemics/insulin No   ?Vasoactive Medication Yes Lisinopril - HTN  ?Chemotherapy No   ?Other Yes Atorvastain - HLD ?Lisinopril - HTN ?Tamsulosin - urinary hesitancy ?Melatonin - sleep ?Linzess - constipation ?Senokot s, miralax - laxatives ?Diclofenac gel - pain  ? ? ? ?Type of Medication Issue Identified Description of Issue Recommendation(s)  ?Drug Interaction(s) (clinically significant) ?    ?Duplicate Therapy ?    ?Allergy ?    ?No Medication Administration End Date ?    ?Incorrect Dose ?    ?Additional Drug Therapy Needed ?    ?Significant med changes from prior encounter (inform family/care partners about these prior to discharge).    ?Other ? Amlodipine stopped on 11/18/21 and staying off. ?Nystatin oral stopping at discharge.   ? ? ?Clinically significant medication issues were identified that warrant physician communication and completion of prescribed/recommended actions by midnight of the next day:  No ? ?Time spent performing this drug regimen review (minutes):  25 ? ?Thank you for allowing pharmacy to be a part of this patient?s care. ? ?Thelma Barge, PharmD ?Clinical Pharmacist ? ?

## 2021-11-28 NOTE — Progress Notes (Signed)
Spoke with daughter related to patient transferring today and lateness of transfer. Reassurance provided, assigned nurse informed writer of attempt to reach transport service, will attempt to call and follow up with family.. ? ?2055 ?[facility] was notified  concerning information of time span for arrival to pick up patient, per family concerns and request. Writer was Informed that  [facility] should be there in approximately 45 minutes to [place] hour.Assigned nurse informed,so that patient would be ready for transfer. ? Daughter  was update and informed per her request of the arrival time of [facility].   ? ?2317 ?Notified by assigned nurse that [facility] could not take the patient because the medical necessity paper forms were not completed and would need to be redone before patient would be transferred.. I notified my on call departmental ADJeanie Cooks, RN) of this challenging  situation  and was encouraged to contact the House AC(Gina) for additional assistance. The ED was contacted to speak with the SW,but was informed she had left at 2300.Eventually writer was able to follow back up with the house AC,who provided additional information to contact the on call SW.  ? ?2324 pm ?Amion was notified for this information on locating a contact number of the on-call SW number,Informed by staff person that she was unable to locate a number  because there was no category available for a SW  but would continue to locate resources.the [facility] was informed. [facility] eventually left due to incompletion of forms  ?2345 ?Family member updates  ? ?2348  ?Update information provided to department on call, Jeanie Cooks, RN, [place] ? ?2359 ?Communicated update with ACBarnett Applebaum), will follow up. ?

## 2021-11-28 NOTE — Progress Notes (Signed)
Called [facility],  spoke with nurse Corrie Dandy (on [employer]), updated her with info evening meds patient received, & tentative 10pm p/u time by transport service. Stated she will notify nurse receiving patient. ?

## 2021-11-28 NOTE — Progress Notes (Signed)
?                                                       PROGRESS NOTE ? ? ?Subjective/Complaints: ?Interpreter in room ?Spilled urinal last noc , no other c/o  ? ?Review of Systems  ?Constitutional:  Negative for chills and fever.  ?Respiratory:  Negative for cough and shortness of breath.   ?Gastrointestinal:  Negative for abdominal pain, diarrhea, nausea and vomiting.   ? ? ?Objective: ?  ?No results found. ?No results for input(s): WBC, HGB, HCT, PLT in the last 72 hours. ? ?No results for input(s): NA, K, CL, CO2, GLUCOSE, BUN, CREATININE, CALCIUM in the last 72 hours. ? ? ?Intake/Output Summary (Last 24 hours) at 11/28/2021 0857 ?Last data filed at 11/28/2021 0818 ?Gross per 24 hour  ?Intake 360 ml  ?Output 775 ml  ?Net -415 ml  ? ?  ? ?  ? ?Physical Exam: ? ? ?General: NAD ?Mood and affect are appropriate, pleasant ?Heart: Regular rate and rhythm no rubs murmurs or extra sounds ?Lungs: Clear to auscultation, breathing unlabored, no rales or wheezes ?Abdomen: Positive bowel sounds, soft nontender to palpation, nondistended ?Extremities: No clubbing, cyanosis, or edema ?Skin: No evidence of breakdown, no evidence of rash, warm and dry skin ?Neurologic: Cranial nerves II through XII grossly  intact, follows commands, motor strength is 5/5 in right and 0/5 left deltoid, bicep, tricep, grip, hip flexor, knee extensors, ankle dorsiflexor and plantar flexor ?Musculoskeletal: Full range of motion in all 4 extremities.  ?No pain with shoulder ROM ? ?Vital Signs ?Blood pressure 126/66, pulse (!) 53, temperature 98.2 ?F (36.8 ?C), temperature source Oral, resp. rate 17, height 5\' 4"  (1.626 m), weight 67.1 kg, SpO2 100 %. ? ? ? ?Assessment/Plan: ?1. Functional deficits due to Left hemiplegia from R CVA ?Stable for D/C today to SNF  ?F/u PMR 1 month  ?See D/C summary ?See D/C instructions  ? ?Care Tool: ? ?Bathing ? Bathing activity did not occur: Safety/medical concerns ?Body parts bathed by patient: Left arm, Chest,  Abdomen, Front perineal area, Right upper leg, Left upper leg, Face  ? Body parts bathed by helper: Left lower leg, Buttocks, Right arm, Right lower leg ?  ?  ?Bathing assist Assist Level: Moderate Assistance - Patient 50 - 74% ?  ?  ?Upper Body Dressing/Undressing ?Upper body dressing Upper body dressing/undressing activity did not occur (including orthotics): Safety/medical concerns ?What is the patient wearing?: Pull over shirt ?   ?Upper body assist Assist Level: Minimal Assistance - Patient > 75% ?   ?Lower Body Dressing/Undressing ?Lower body dressing ? ? ? Lower body dressing activity did not occur: Safety/medical concerns ?What is the patient wearing?: Pants, Incontinence brief ? ?  ? ?Lower body assist Assist for lower body dressing: Maximal Assistance - Patient 25 - 49% ?   ? ?Toileting ?Toileting Toileting Activity did not occur (Probation officer and hygiene only): N/A (no void or bm)  ?Toileting assist Assist for toileting: Total Assistance - Patient < 25% ?  ?  ?Transfers ?Chair/bed transfer ? ?Transfers assist ? Chair/bed transfer activity did not occur: Safety/medical concerns ? ?Chair/bed transfer assist level: Moderate Assistance - Patient 50 - 74% ?  ?  ?Locomotion ?Ambulation ? ? ?Ambulation assist ? ? Ambulation activity did not occur: Safety/medical concerns ? ?Assist  level: 2 helpers ?Assistive device: Other (comment) (rail in hall) ?Max distance: 15  ? ?Walk 10 feet activity ? ? ?Assist ?   ? ?Assist level: 2 helpers ?Assistive device: Other (comment)  ? ?Walk 50 feet activity ? ? ?Assist Walk 50 feet with 2 turns activity did not occur: Safety/medical concerns ? ?  ?   ? ? ?Walk 150 feet activity ? ? ?Assist Walk 150 feet activity did not occur: Safety/medical concerns ? ?  ?  ?  ? ?Walk 10 feet on uneven surface  ?activity ? ? ?Assist Walk 10 feet on uneven surfaces activity did not occur: Safety/medical concerns ? ? ?  ?   ? ?Wheelchair ? ? ? ? ?Assist Is the patient using a wheelchair?:  Yes ?Type of Wheelchair: Manual ?  ? ?Wheelchair assist level: Dependent - Patient 0% ?Max wheelchair distance: 150  ? ? ?Wheelchair 50 feet with 2 turns activity ? ? ? ?Assist ? ?  ?  ? ? ?Assist Level: Dependent - Patient 0%  ? ?Wheelchair 150 feet activity  ? ? ? ?Assist ?   ? ? ?Assist Level: Dependent - Patient 0%  ? ?Blood pressure 126/66, pulse (!) 53, temperature 98.2 ?F (36.8 ?C), temperature source Oral, resp. rate 17, height 5\' 4"  (1.626 m), weight 67.1 kg, SpO2 100 %. ? ?Medical Problem List and Plan: ?1. 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            - ?-Discharge to  SNF , DC summary FL2 complete ?2.  Antithrombotics: ?-DVT/anticoagulation: Pharmaceutical: Lovenox ?            -antiplatelet therapy: Aspirin 81 mg daily and Plavix 71 mg daily x3 months then aspirin alone. ?3. Pain Management: Tylenol as needed,  ?Pt on low dose prn tramadol which pt appears to tolerate without drowsiness, per family request try acetaminophen first ?Off gabapentin due to drowsiness ?D/c muscle rub due to irritation, will trial voltaren gel ?4. Mood: Provide emotional support ?- Antipsychotic agents: N/A ?-Reports mood is good overall ?5. Neuropsych: This patient is capable of making decisions on his own behalf. ?6. Skin/Wound Care: Routine skin checks ?7. Fluids/Electrolytes/Nutrition: Routine in and outs with follow-up chemistries ?8.  Hypertension.   ? ? ?  11/28/2021  ?  2:47 AM 11/27/2021  ?  8:36 PM 11/27/2021  ?  1:54 PM  ?Vitals with BMI  ?Systolic [id number]  ?Diastolic 66 66 67  ?Pulse 53 60 70  ? 5/3 BP in good range  ?9.  Hyperlipidemia continue Lipitor tablet 40 mg at bedtime daily. ?10.  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  Follow-up as outpatient. ?12.  Urinary hesitation-PVRS/bladder scans to make sure patient's not retaining.  Had  mildly elevated BCs urine culture reviewed showed no growth WBCs now are within normal limits. ?Had a low grade temp x 1 with mild leukocytosis Recheck UA, essentially neg  ?No recorded I/O cath in several days cont flomax , last bladder scan normal at 78ml  ?13.  Insomnia: Continue melatonin 3 mg HS ?14.  Bradycardia: mild asymptomatic , EKG on 4/5 sinus brady arate 59 bpm, no other abnormalities ?Not on beta blocker  ?-HR around 60, continue to monitor ? ?  ?LOS: ?27 days ?A FACE TO FACE EVALUATION WAS PERFORMED ? ?Charlett Blake, MD ?11/28/2021, 8:57 AM  ? ? ? ?

## 2021-11-28 NOTE — Progress Notes (Signed)
Called report to [facility] at this time.  ?

## 2021-11-29 DIAGNOSIS — M249 Joint derangement, unspecified: Secondary | ICD-10-CM | POA: Diagnosis not present

## 2021-11-29 DIAGNOSIS — I779 Disorder of arteries and arterioles, unspecified: Secondary | ICD-10-CM | POA: Diagnosis not present

## 2021-11-29 DIAGNOSIS — I6521 Occlusion and stenosis of right carotid artery: Secondary | ICD-10-CM | POA: Diagnosis not present

## 2021-11-29 DIAGNOSIS — M6249 Contracture of muscle, multiple sites: Secondary | ICD-10-CM | POA: Diagnosis not present

## 2021-11-29 DIAGNOSIS — M2550 Pain in unspecified joint: Secondary | ICD-10-CM | POA: Diagnosis not present

## 2021-11-29 DIAGNOSIS — M629 Disorder of muscle, unspecified: Secondary | ICD-10-CM | POA: Diagnosis not present

## 2021-11-29 DIAGNOSIS — I6529 Occlusion and stenosis of unspecified carotid artery: Secondary | ICD-10-CM | POA: Diagnosis not present

## 2021-11-29 DIAGNOSIS — H5442A3 Blindness left eye category 3, normal vision right eye: Secondary | ICD-10-CM | POA: Diagnosis not present

## 2021-11-29 DIAGNOSIS — Z1331 Encounter for screening for depression: Secondary | ICD-10-CM | POA: Diagnosis not present

## 2021-11-29 DIAGNOSIS — Z9181 History of falling: Secondary | ICD-10-CM | POA: Diagnosis not present

## 2021-11-29 DIAGNOSIS — R2681 Unsteadiness on feet: Secondary | ICD-10-CM | POA: Diagnosis not present

## 2021-11-29 DIAGNOSIS — H53469 Homonymous bilateral field defects, unspecified side: Secondary | ICD-10-CM | POA: Diagnosis not present

## 2021-11-29 DIAGNOSIS — M25532 Pain in left wrist: Secondary | ICD-10-CM | POA: Diagnosis not present

## 2021-11-29 DIAGNOSIS — R609 Edema, unspecified: Secondary | ICD-10-CM | POA: Diagnosis not present

## 2021-11-29 DIAGNOSIS — L853 Xerosis cutis: Secondary | ICD-10-CM | POA: Diagnosis not present

## 2021-11-29 DIAGNOSIS — F4320 Adjustment disorder, unspecified: Secondary | ICD-10-CM | POA: Diagnosis not present

## 2021-11-29 DIAGNOSIS — F5101 Primary insomnia: Secondary | ICD-10-CM | POA: Diagnosis not present

## 2021-11-29 DIAGNOSIS — M533 Sacrococcygeal disorders, not elsewhere classified: Secondary | ICD-10-CM | POA: Diagnosis not present

## 2021-11-29 DIAGNOSIS — R1310 Dysphagia, unspecified: Secondary | ICD-10-CM | POA: Diagnosis not present

## 2021-11-29 DIAGNOSIS — I672 Cerebral atherosclerosis: Secondary | ICD-10-CM | POA: Diagnosis not present

## 2021-11-29 DIAGNOSIS — I63511 Cerebral infarction due to unspecified occlusion or stenosis of right middle cerebral artery: Secondary | ICD-10-CM | POA: Diagnosis not present

## 2021-11-29 DIAGNOSIS — S0592XA Unspecified injury of left eye and orbit, initial encounter: Secondary | ICD-10-CM | POA: Diagnosis not present

## 2021-11-29 DIAGNOSIS — W19XXXA Unspecified fall, initial encounter: Secondary | ICD-10-CM | POA: Diagnosis not present

## 2021-11-29 DIAGNOSIS — R152 Fecal urgency: Secondary | ICD-10-CM | POA: Diagnosis not present

## 2021-11-29 DIAGNOSIS — K5901 Slow transit constipation: Secondary | ICD-10-CM | POA: Diagnosis not present

## 2021-11-29 DIAGNOSIS — H53462 Homonymous bilateral field defects, left side: Secondary | ICD-10-CM | POA: Diagnosis not present

## 2021-11-29 DIAGNOSIS — Z0189 Encounter for other specified special examinations: Secondary | ICD-10-CM | POA: Diagnosis not present

## 2021-11-29 DIAGNOSIS — G8194 Hemiplegia, unspecified affecting left nondominant side: Secondary | ICD-10-CM | POA: Diagnosis not present

## 2021-11-29 DIAGNOSIS — Z23 Encounter for immunization: Secondary | ICD-10-CM | POA: Diagnosis not present

## 2021-11-29 DIAGNOSIS — R1312 Dysphagia, oropharyngeal phase: Secondary | ICD-10-CM | POA: Diagnosis not present

## 2021-11-29 DIAGNOSIS — G4700 Insomnia, unspecified: Secondary | ICD-10-CM | POA: Diagnosis not present

## 2021-11-29 DIAGNOSIS — I679 Cerebrovascular disease, unspecified: Secondary | ICD-10-CM | POA: Diagnosis not present

## 2021-11-29 DIAGNOSIS — R2990 Unspecified symptoms and signs involving the nervous system: Secondary | ICD-10-CM | POA: Diagnosis not present

## 2021-11-29 DIAGNOSIS — I69354 Hemiplegia and hemiparesis following cerebral infarction affecting left non-dominant side: Secondary | ICD-10-CM | POA: Diagnosis not present

## 2021-11-29 DIAGNOSIS — I10 Essential (primary) hypertension: Secondary | ICD-10-CM | POA: Diagnosis not present

## 2021-11-29 DIAGNOSIS — I63311 Cerebral infarction due to thrombosis of right middle cerebral artery: Secondary | ICD-10-CM | POA: Diagnosis not present

## 2021-11-29 DIAGNOSIS — M6281 Muscle weakness (generalized): Secondary | ICD-10-CM | POA: Diagnosis not present

## 2021-11-29 DIAGNOSIS — R2689 Other abnormalities of gait and mobility: Secondary | ICD-10-CM | POA: Diagnosis not present

## 2021-11-29 DIAGNOSIS — R41841 Cognitive communication deficit: Secondary | ICD-10-CM | POA: Diagnosis not present

## 2021-11-29 DIAGNOSIS — I119 Hypertensive heart disease without heart failure: Secondary | ICD-10-CM | POA: Diagnosis not present

## 2021-11-29 DIAGNOSIS — E782 Mixed hyperlipidemia: Secondary | ICD-10-CM | POA: Diagnosis not present

## 2021-11-29 DIAGNOSIS — E785 Hyperlipidemia, unspecified: Secondary | ICD-10-CM | POA: Diagnosis not present

## 2021-11-29 DIAGNOSIS — Z7401 Bed confinement status: Secondary | ICD-10-CM | POA: Diagnosis not present

## 2021-11-29 DIAGNOSIS — M79642 Pain in left hand: Secondary | ICD-10-CM | POA: Diagnosis not present

## 2021-11-29 DIAGNOSIS — R262 Difficulty in walking, not elsewhere classified: Secondary | ICD-10-CM | POA: Diagnosis not present

## 2021-11-29 DIAGNOSIS — E7849 Other hyperlipidemia: Secondary | ICD-10-CM | POA: Diagnosis not present

## 2021-11-29 DIAGNOSIS — M792 Neuralgia and neuritis, unspecified: Secondary | ICD-10-CM | POA: Diagnosis not present

## 2021-11-29 DIAGNOSIS — M545 Low back pain, unspecified: Secondary | ICD-10-CM | POA: Diagnosis not present

## 2021-11-29 DIAGNOSIS — Z9189 Other specified personal risk factors, not elsewhere classified: Secondary | ICD-10-CM | POA: Diagnosis not present

## 2021-11-29 DIAGNOSIS — Z8673 Personal history of transient ischemic attack (TIA), and cerebral infarction without residual deficits: Secondary | ICD-10-CM | POA: Diagnosis not present

## 2021-11-29 LAB — CREATININE, SERUM
Creatinine, Ser: 0.74 mg/dL (ref 0.61–1.24)
GFR, Estimated: >60 mL/min (ref >60)

## 2021-11-29 NOTE — Plan of Care (Addendum)
?  Problem: RH Wheelchair Mobility ?Goal: LTG Patient will propel w/c in controlled environment (PT) ?Description: LTG: Patient will propel wheelchair in controlled environment, # of feet with assist (PT) ?Outcome: Adequate for Discharge ?Flowsheets ?Taken 11/29/2021 0951 by Alger Simons, PT ?LTG: Pt will propel w/c in controlled environ  assist needed:: Contact Guard/Touching assist ?Taken 11/25/2021 0551 by [name], PT ?LTG: Propel w/c distance in controlled environment: 113f ?Note: Requires vc for repositioning every 25' d/t forward scoot on seat cushion. Pt not discharging home, but to SNF for further skilled therapy.  ?Goal: LTG Patient will propel w/c in home environment (PT) ?Description: LTG: Patient will propel wheelchair in home environment, # of feet with assistance (PT). ?Outcome: Adequate for Discharge ?Flowsheets ?Taken 11/29/2021 0951 by KAlger Simons PT ?LTG: Pt will propel w/c in home environ  assist needed:: Contact Guard/Touching assist ?Taken 11/03/2021 0720 by TLorie Phenix PT ?LTG: Propel w/c distance in home environment: 50 ?Note: Requires vc for repositioning every 25' d/t forward scoot on seat cushion. Pt not discharging home, but to SNF for further skilled therapy.  ?  ?Problem: RH Balance ?Goal: LTG Patient will maintain dynamic sitting balance (PT) ?Description: LTG:  Patient will maintain dynamic sitting balance with assistance during mobility activities (PT) ?Outcome: Completed/Met ?Flowsheets (Taken 11/03/2021 0720 by TLorie Phenix PT) ?LTG: Pt will maintain dynamic sitting balance during mobility activities with:: Supervision/Verbal cueing ?  ?Problem: RH Bed Mobility ?Goal: LTG Patient will perform bed mobility with assist (PT) ?Description: LTG: Patient will perform bed mobility with assistance, with/without cues (PT). ?Outcome: Completed/Met ?Flowsheets (Taken 11/29/2021 0951) ?LTG: Pt will perform bed mobility with assistance level of: Supervision/Verbal cueing ?   ?Problem: RH Bed to Chair Transfers ?Goal: LTG Patient will perform bed/chair transfers w/assist (PT) ?Description: LTG: Patient will perform bed to chair transfers with assistance (PT). ?Outcome: Completed/Met ?Flowsheets (Taken 11/21/2021 1028 by TLorie Phenix PT) ?LTG: Pt will perform Bed to Chair Transfers with assistance level: Minimal Assistance - Patient > 75% ?  ?Problem: RH Car Transfers ?Goal: LTG Patient will perform car transfers with assist (PT) ?Description: LTG: Patient will perform car transfers with assistance (PT). ?Outcome: Completed/Met ?Flowsheets (Taken 11/21/2021 1028 by TLorie Phenix PT) ?LTG: Pt will perform car transfers with assist:: Moderate Assistance - Patient 50 - 74% ?  ?Problem: RH Ambulation ?Goal: LTG Patient will ambulate in controlled environment (PT) ?Description: LTG: Patient will ambulate in a controlled environment, # of feet with assistance (PT). ?Outcome: Completed/Met ?Flowsheets ?Taken 11/29/2021 0951 by KAlger Simons PT ?LTG: Ambulation distance in controlled environment: 40 ft ?Taken 11/21/2021 1028 by TLorie Phenix PT ?LTG: Pt will ambulate in controlled environ  assist needed:: Maximal Assistance - Patient 25 - 49% ?Goal: LTG Patient will ambulate in home environment (PT) ?Description: LTG: Patient will ambulate in home environment, # of feet with assistance (PT). ?Outcome: Completed/Met ?Flowsheets (Taken 11/21/2021 1028 by TLorie Phenix PT) ?LTG: Pt will ambulate in home environ  assist needed:: Maximal Assistance - Patient 25 - 49% ?LTG: Ambulation distance in home environment: 122fwith therapy only and LRAD ?  ?

## 2021-11-29 NOTE — Progress Notes (Signed)
Transport company here to transport patient to SNF [facility]). Provided them with paperwork in envelope. On inspection of paper contents, EMT stated cannot take the patient because a form wasn't completed by social worker appropriately & insurance wouldn't pay for the service. This nurse notified charge nurse of situation. EMT's agreed to wait for paper to be corrected. At  approximately 2350, EMT stated they cannot wait longer due to fact have more transports to perform. I notified charge nurse per phone, stated she will contact daughter to inform her.  EMT's left unit, & envelope with paperwork was returned to nurses station. @ 2353, I notified nurse TAMIKA @ SNF [facility]) that patient wouldn't be arriving tonight as scheduled.   ?

## 2021-11-30 DIAGNOSIS — S0592XA Unspecified injury of left eye and orbit, initial encounter: Secondary | ICD-10-CM | POA: Diagnosis not present

## 2021-11-30 DIAGNOSIS — I10 Essential (primary) hypertension: Secondary | ICD-10-CM | POA: Diagnosis not present

## 2021-11-30 DIAGNOSIS — I69354 Hemiplegia and hemiparesis following cerebral infarction affecting left non-dominant side: Secondary | ICD-10-CM | POA: Diagnosis not present

## 2021-11-30 DIAGNOSIS — Z9181 History of falling: Secondary | ICD-10-CM | POA: Diagnosis not present

## 2021-11-30 DIAGNOSIS — K5901 Slow transit constipation: Secondary | ICD-10-CM | POA: Diagnosis not present

## 2021-11-30 DIAGNOSIS — E785 Hyperlipidemia, unspecified: Secondary | ICD-10-CM | POA: Diagnosis not present

## 2021-11-30 DIAGNOSIS — I679 Cerebrovascular disease, unspecified: Secondary | ICD-10-CM | POA: Diagnosis not present

## 2021-11-30 DIAGNOSIS — R1310 Dysphagia, unspecified: Secondary | ICD-10-CM | POA: Diagnosis not present

## 2021-11-30 DIAGNOSIS — G4700 Insomnia, unspecified: Secondary | ICD-10-CM | POA: Diagnosis not present

## 2021-11-30 DIAGNOSIS — I779 Disorder of arteries and arterioles, unspecified: Secondary | ICD-10-CM | POA: Diagnosis not present

## 2021-12-03 DIAGNOSIS — E7849 Other hyperlipidemia: Secondary | ICD-10-CM | POA: Diagnosis not present

## 2021-12-03 DIAGNOSIS — H5442A3 Blindness left eye category 3, normal vision right eye: Secondary | ICD-10-CM | POA: Diagnosis not present

## 2021-12-03 DIAGNOSIS — G4700 Insomnia, unspecified: Secondary | ICD-10-CM | POA: Diagnosis not present

## 2021-12-03 DIAGNOSIS — I119 Hypertensive heart disease without heart failure: Secondary | ICD-10-CM | POA: Diagnosis not present

## 2021-12-03 DIAGNOSIS — I63311 Cerebral infarction due to thrombosis of right middle cerebral artery: Secondary | ICD-10-CM | POA: Diagnosis not present

## 2021-12-03 DIAGNOSIS — I6529 Occlusion and stenosis of unspecified carotid artery: Secondary | ICD-10-CM | POA: Diagnosis not present

## 2021-12-03 DIAGNOSIS — G8194 Hemiplegia, unspecified affecting left nondominant side: Secondary | ICD-10-CM | POA: Diagnosis not present

## 2021-12-03 DIAGNOSIS — R2681 Unsteadiness on feet: Secondary | ICD-10-CM | POA: Diagnosis not present

## 2021-12-05 DIAGNOSIS — G4700 Insomnia, unspecified: Secondary | ICD-10-CM | POA: Diagnosis not present

## 2021-12-05 DIAGNOSIS — I119 Hypertensive heart disease without heart failure: Secondary | ICD-10-CM | POA: Diagnosis not present

## 2021-12-05 DIAGNOSIS — I63311 Cerebral infarction due to thrombosis of right middle cerebral artery: Secondary | ICD-10-CM | POA: Diagnosis not present

## 2021-12-05 DIAGNOSIS — I6529 Occlusion and stenosis of unspecified carotid artery: Secondary | ICD-10-CM | POA: Diagnosis not present

## 2021-12-05 DIAGNOSIS — G8194 Hemiplegia, unspecified affecting left nondominant side: Secondary | ICD-10-CM | POA: Diagnosis not present

## 2021-12-05 DIAGNOSIS — R2681 Unsteadiness on feet: Secondary | ICD-10-CM | POA: Diagnosis not present

## 2021-12-05 DIAGNOSIS — E7849 Other hyperlipidemia: Secondary | ICD-10-CM | POA: Diagnosis not present

## 2021-12-05 DIAGNOSIS — H5442A3 Blindness left eye category 3, normal vision right eye: Secondary | ICD-10-CM | POA: Diagnosis not present

## 2021-12-06 ENCOUNTER — Other Ambulatory Visit: Payer: Self-pay | Admitting: *Deleted

## 2021-12-06 DIAGNOSIS — I119 Hypertensive heart disease without heart failure: Secondary | ICD-10-CM | POA: Diagnosis not present

## 2021-12-06 DIAGNOSIS — I6529 Occlusion and stenosis of unspecified carotid artery: Secondary | ICD-10-CM | POA: Diagnosis not present

## 2021-12-06 DIAGNOSIS — G8194 Hemiplegia, unspecified affecting left nondominant side: Secondary | ICD-10-CM | POA: Diagnosis not present

## 2021-12-06 DIAGNOSIS — R2681 Unsteadiness on feet: Secondary | ICD-10-CM | POA: Diagnosis not present

## 2021-12-06 DIAGNOSIS — H5442A3 Blindness left eye category 3, normal vision right eye: Secondary | ICD-10-CM | POA: Diagnosis not present

## 2021-12-06 DIAGNOSIS — I63311 Cerebral infarction due to thrombosis of right middle cerebral artery: Secondary | ICD-10-CM | POA: Diagnosis not present

## 2021-12-06 DIAGNOSIS — E7849 Other hyperlipidemia: Secondary | ICD-10-CM | POA: Diagnosis not present

## 2021-12-06 DIAGNOSIS — G4700 Insomnia, unspecified: Secondary | ICD-10-CM | POA: Diagnosis not present

## 2021-12-06 NOTE — Patient Outreach (Signed)
Per [place] eligible member currently resides in [facility] and Rehab SNF.  Screening for potential [facility] care coordination services as a benefit of [employer] plan. Mr. Lewers was active with [facility] EMMI program prior.  ? ?Mr. Vigeant admitted to SNF on 11/29/21 after hospitalization and inpatient rehab stay.  ? ?Member's PCP at [facility] has [place] care coordination team. ? ?Facility site visit to [place] skilled nursing facility. Met with Kirstin, SNF SW and  Verdis Frederickson, PT concerning member's progress, transition plan, and potential [facility] needs. Mr. Brusca requires interpreter. Daughter is primary contact. Anticipated transition plan is to return home with daughter. Family is making home renovations to accommodate member's needs. Mr. Kobus was independent and driving prior. Currently he is requiring max assist. Continues to work with therapy with goal to return home. ? ?Went to Mr. Davitt room. He was off unit in therapy. Left [facility] Care Management brochure, 24-hr nurse advice line magnet, and writer's contact information.  ? ?Will plan outreach to daughter at later time.  ? ?Will continue to follow while member resides in SNF.  ? ? ?Marthenia Rolling, MSN, RN,BSN ?[place] Coordinator ?[phone] [facility]) ?[phone]  (Toll free office)  ?

## 2021-12-10 DIAGNOSIS — G4700 Insomnia, unspecified: Secondary | ICD-10-CM | POA: Diagnosis not present

## 2021-12-10 DIAGNOSIS — G8194 Hemiplegia, unspecified affecting left nondominant side: Secondary | ICD-10-CM | POA: Diagnosis not present

## 2021-12-10 DIAGNOSIS — R2681 Unsteadiness on feet: Secondary | ICD-10-CM | POA: Diagnosis not present

## 2021-12-10 DIAGNOSIS — I119 Hypertensive heart disease without heart failure: Secondary | ICD-10-CM | POA: Diagnosis not present

## 2021-12-10 DIAGNOSIS — I6529 Occlusion and stenosis of unspecified carotid artery: Secondary | ICD-10-CM | POA: Diagnosis not present

## 2021-12-10 DIAGNOSIS — I63311 Cerebral infarction due to thrombosis of right middle cerebral artery: Secondary | ICD-10-CM | POA: Diagnosis not present

## 2021-12-10 DIAGNOSIS — H5442A3 Blindness left eye category 3, normal vision right eye: Secondary | ICD-10-CM | POA: Diagnosis not present

## 2021-12-10 DIAGNOSIS — F5101 Primary insomnia: Secondary | ICD-10-CM | POA: Diagnosis not present

## 2021-12-10 DIAGNOSIS — F4320 Adjustment disorder, unspecified: Secondary | ICD-10-CM | POA: Diagnosis not present

## 2021-12-10 DIAGNOSIS — E7849 Other hyperlipidemia: Secondary | ICD-10-CM | POA: Diagnosis not present

## 2021-12-11 DIAGNOSIS — I10 Essential (primary) hypertension: Secondary | ICD-10-CM | POA: Diagnosis not present

## 2021-12-11 DIAGNOSIS — I69354 Hemiplegia and hemiparesis following cerebral infarction affecting left non-dominant side: Secondary | ICD-10-CM | POA: Diagnosis not present

## 2021-12-11 DIAGNOSIS — Z1331 Encounter for screening for depression: Secondary | ICD-10-CM | POA: Diagnosis not present

## 2021-12-11 DIAGNOSIS — Z8673 Personal history of transient ischemic attack (TIA), and cerebral infarction without residual deficits: Secondary | ICD-10-CM | POA: Diagnosis not present

## 2021-12-12 DIAGNOSIS — I63311 Cerebral infarction due to thrombosis of right middle cerebral artery: Secondary | ICD-10-CM | POA: Diagnosis not present

## 2021-12-12 DIAGNOSIS — E7849 Other hyperlipidemia: Secondary | ICD-10-CM | POA: Diagnosis not present

## 2021-12-12 DIAGNOSIS — G8194 Hemiplegia, unspecified affecting left nondominant side: Secondary | ICD-10-CM | POA: Diagnosis not present

## 2021-12-12 DIAGNOSIS — I6529 Occlusion and stenosis of unspecified carotid artery: Secondary | ICD-10-CM | POA: Diagnosis not present

## 2021-12-12 DIAGNOSIS — G4700 Insomnia, unspecified: Secondary | ICD-10-CM | POA: Diagnosis not present

## 2021-12-12 DIAGNOSIS — H5442A3 Blindness left eye category 3, normal vision right eye: Secondary | ICD-10-CM | POA: Diagnosis not present

## 2021-12-12 DIAGNOSIS — R2681 Unsteadiness on feet: Secondary | ICD-10-CM | POA: Diagnosis not present

## 2021-12-12 DIAGNOSIS — I119 Hypertensive heart disease without heart failure: Secondary | ICD-10-CM | POA: Diagnosis not present

## 2021-12-13 DIAGNOSIS — I119 Hypertensive heart disease without heart failure: Secondary | ICD-10-CM | POA: Diagnosis not present

## 2021-12-13 DIAGNOSIS — R2681 Unsteadiness on feet: Secondary | ICD-10-CM | POA: Diagnosis not present

## 2021-12-13 DIAGNOSIS — I6529 Occlusion and stenosis of unspecified carotid artery: Secondary | ICD-10-CM | POA: Diagnosis not present

## 2021-12-13 DIAGNOSIS — E7849 Other hyperlipidemia: Secondary | ICD-10-CM | POA: Diagnosis not present

## 2021-12-13 DIAGNOSIS — H5442A3 Blindness left eye category 3, normal vision right eye: Secondary | ICD-10-CM | POA: Diagnosis not present

## 2021-12-13 DIAGNOSIS — G4700 Insomnia, unspecified: Secondary | ICD-10-CM | POA: Diagnosis not present

## 2021-12-13 DIAGNOSIS — G8194 Hemiplegia, unspecified affecting left nondominant side: Secondary | ICD-10-CM | POA: Diagnosis not present

## 2021-12-13 DIAGNOSIS — I63311 Cerebral infarction due to thrombosis of right middle cerebral artery: Secondary | ICD-10-CM | POA: Diagnosis not present

## 2021-12-17 ENCOUNTER — Encounter: Payer: Medicare Other | Attending: Registered Nurse | Admitting: Registered Nurse

## 2021-12-17 VITALS — BP 131/80 | HR 85

## 2021-12-17 DIAGNOSIS — R2681 Unsteadiness on feet: Secondary | ICD-10-CM | POA: Diagnosis not present

## 2021-12-17 DIAGNOSIS — H5442A3 Blindness left eye category 3, normal vision right eye: Secondary | ICD-10-CM | POA: Diagnosis not present

## 2021-12-17 DIAGNOSIS — I63311 Cerebral infarction due to thrombosis of right middle cerebral artery: Secondary | ICD-10-CM | POA: Diagnosis not present

## 2021-12-17 DIAGNOSIS — I6529 Occlusion and stenosis of unspecified carotid artery: Secondary | ICD-10-CM | POA: Diagnosis not present

## 2021-12-17 DIAGNOSIS — G4700 Insomnia, unspecified: Secondary | ICD-10-CM | POA: Diagnosis not present

## 2021-12-17 DIAGNOSIS — I10 Essential (primary) hypertension: Secondary | ICD-10-CM | POA: Diagnosis not present

## 2021-12-17 DIAGNOSIS — I63511 Cerebral infarction due to unspecified occlusion or stenosis of right middle cerebral artery: Secondary | ICD-10-CM | POA: Insufficient documentation

## 2021-12-17 DIAGNOSIS — E7849 Other hyperlipidemia: Secondary | ICD-10-CM | POA: Diagnosis not present

## 2021-12-17 DIAGNOSIS — I119 Hypertensive heart disease without heart failure: Secondary | ICD-10-CM | POA: Diagnosis not present

## 2021-12-17 DIAGNOSIS — G8194 Hemiplegia, unspecified affecting left nondominant side: Secondary | ICD-10-CM | POA: Diagnosis not present
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
## 2021-12-19 DIAGNOSIS — I63311 Cerebral infarction due to thrombosis of right middle cerebral artery: Secondary | ICD-10-CM | POA: Diagnosis not present

## 2021-12-19 DIAGNOSIS — R2681 Unsteadiness on feet: Secondary | ICD-10-CM | POA: Diagnosis not present

## 2021-12-19 DIAGNOSIS — I6529 Occlusion and stenosis of unspecified carotid artery: Secondary | ICD-10-CM | POA: Diagnosis not present

## 2021-12-19 DIAGNOSIS — G4700 Insomnia, unspecified: Secondary | ICD-10-CM | POA: Diagnosis not present

## 2021-12-19 DIAGNOSIS — I119 Hypertensive heart disease without heart failure: Secondary | ICD-10-CM | POA: Diagnosis not present

## 2021-12-19 DIAGNOSIS — E7849 Other hyperlipidemia: Secondary | ICD-10-CM | POA: Diagnosis not present

## 2021-12-19 DIAGNOSIS — H5442A3 Blindness left eye category 3, normal vision right eye: Secondary | ICD-10-CM | POA: Diagnosis not present

## 2021-12-19 DIAGNOSIS — G8194 Hemiplegia, unspecified affecting left nondominant side: Secondary | ICD-10-CM | POA: Diagnosis not present

## 2021-12-20 ENCOUNTER — Other Ambulatory Visit: Payer: Self-pay | Admitting: *Deleted

## 2021-12-20 ENCOUNTER — Telehealth: Payer: Self-pay | Admitting: Registered Nurse

## 2021-12-20 DIAGNOSIS — R2681 Unsteadiness on feet: Secondary | ICD-10-CM | POA: Diagnosis not present

## 2021-12-20 DIAGNOSIS — H5442A3 Blindness left eye category 3, normal vision right eye: Secondary | ICD-10-CM | POA: Diagnosis not present

## 2021-12-20 DIAGNOSIS — G4700 Insomnia, unspecified: Secondary | ICD-10-CM | POA: Diagnosis not present

## 2021-12-20 DIAGNOSIS — E7849 Other hyperlipidemia: Secondary | ICD-10-CM | POA: Diagnosis not present

## 2021-12-20 DIAGNOSIS — G8194 Hemiplegia, unspecified affecting left nondominant side: Secondary | ICD-10-CM | POA: Diagnosis not present

## 2021-12-20 DIAGNOSIS — I6529 Occlusion and stenosis of unspecified carotid artery: Secondary | ICD-10-CM | POA: Diagnosis not present

## 2021-12-20 DIAGNOSIS — I63311 Cerebral infarction due to thrombosis of right middle cerebral artery: Secondary | ICD-10-CM | POA: Diagnosis not present

## 2021-12-20 DIAGNOSIS — I119 Hypertensive heart disease without heart failure: Secondary | ICD-10-CM | POA: Diagnosis not present

## 2021-12-20 NOTE — Telephone Encounter (Signed)
This Provider placed a call to [facility] as requested by daughter. Spoke with Justice Rocher, staff nurse, she states Mr. Massman is receiving his therapy. She was unable to look at the PT/OT notes. She will speak with the above departments.  Placed a call to Ms [name] regarding the above, she verbalizes understanding.

## 2021-12-20 NOTE — Patient Outreach (Signed)
[facility] Post- Acute Care Coordinator follow up. Per [facility] eligible member currently resides in [facility].  Screening for potential [facility] care coordination services as a benefit of [employer] plan.  Update received from [facility] SW indicating member continues to participate with therapy. Anticipated transition plan is for home. Requires ongoing therapy. No transition date set yet.   Member's PCP at [facility] has [facility] care coordination services available if needed post SNF.   Will continue to follow.   Raiford Noble, MSN, RN,BSN [facility] Post Acute Care Coordinator [phone] [facility]) [phone]  (Toll free office)

## 2021-12-25 DIAGNOSIS — G8194 Hemiplegia, unspecified affecting left nondominant side: Secondary | ICD-10-CM | POA: Diagnosis not present

## 2021-12-25 DIAGNOSIS — I119 Hypertensive heart disease without heart failure: Secondary | ICD-10-CM | POA: Diagnosis not present

## 2021-12-25 DIAGNOSIS — I63311 Cerebral infarction due to thrombosis of right middle cerebral artery: Secondary | ICD-10-CM | POA: Diagnosis not present

## 2021-12-25 DIAGNOSIS — G4700 Insomnia, unspecified: Secondary | ICD-10-CM | POA: Diagnosis not present

## 2021-12-25 DIAGNOSIS — H5442A3 Blindness left eye category 3, normal vision right eye: Secondary | ICD-10-CM | POA: Diagnosis not present

## 2021-12-25 DIAGNOSIS — E7849 Other hyperlipidemia: Secondary | ICD-10-CM | POA: Diagnosis not present

## 2021-12-25 DIAGNOSIS — R2681 Unsteadiness on feet: Secondary | ICD-10-CM | POA: Diagnosis not present

## 2021-12-25 DIAGNOSIS — I6529 Occlusion and stenosis of unspecified carotid artery: Secondary | ICD-10-CM | POA: Diagnosis not present

## 2021-12-27 ENCOUNTER — Other Ambulatory Visit: Payer: Self-pay | Admitting: *Deleted

## 2021-12-27 DIAGNOSIS — G8194 Hemiplegia, unspecified affecting left nondominant side: Secondary | ICD-10-CM | POA: Diagnosis not present

## 2021-12-27 DIAGNOSIS — E7849 Other hyperlipidemia: Secondary | ICD-10-CM | POA: Diagnosis not present

## 2021-12-27 DIAGNOSIS — I63311 Cerebral infarction due to thrombosis of right middle cerebral artery: Secondary | ICD-10-CM | POA: Diagnosis not present

## 2021-12-27 DIAGNOSIS — R2681 Unsteadiness on feet: Secondary | ICD-10-CM | POA: Diagnosis not present

## 2021-12-27 DIAGNOSIS — I6529 Occlusion and stenosis of unspecified carotid artery: Secondary | ICD-10-CM | POA: Diagnosis not present

## 2021-12-27 DIAGNOSIS — H5442A3 Blindness left eye category 3, normal vision right eye: Secondary | ICD-10-CM | POA: Diagnosis not present

## 2021-12-27 DIAGNOSIS — G4700 Insomnia, unspecified: Secondary | ICD-10-CM | POA: Diagnosis not present

## 2021-12-27 DIAGNOSIS — I119 Hypertensive heart disease without heart failure: Secondary | ICD-10-CM | POA: Diagnosis not present

## 2021-12-27 NOTE — Patient Outreach (Signed)
[facility] Post- Acute Care Coordinator follow up. Per [place] eligible member currently resides in [facility].  Screening for potential [facility] care coordination services as a benefit of [employer] plan.  Member's PCP at [facility] has [place] site visit to [facility] facility. Met with Kirstin, SNF SW to discuss transition plans. Mr. Schuyler continues to work with therapy for strengthening. Anticipated plan is to transition home with daughter.   Went to [employer]. Mr. Nannini off unit. Family not present.   Will continue to follow and plan outreach to daughter as appropriate.     Marthenia Rolling, MSN, RN,BSN [place] Acute Care Coordinator [phone] [facility]) [phone]  (Toll free office)

## 2021-12-28 DIAGNOSIS — I6529 Occlusion and stenosis of unspecified carotid artery: Secondary | ICD-10-CM | POA: Diagnosis not present

## 2021-12-28 DIAGNOSIS — I63311 Cerebral infarction due to thrombosis of right middle cerebral artery: Secondary | ICD-10-CM | POA: Diagnosis not present

## 2021-12-28 DIAGNOSIS — G4700 Insomnia, unspecified: Secondary | ICD-10-CM | POA: Diagnosis not present

## 2021-12-28 DIAGNOSIS — H5442A3 Blindness left eye category 3, normal vision right eye: Secondary | ICD-10-CM | POA: Diagnosis not present

## 2021-12-28 DIAGNOSIS — R2681 Unsteadiness on feet: Secondary | ICD-10-CM | POA: Diagnosis not present

## 2021-12-28 DIAGNOSIS — G8194 Hemiplegia, unspecified affecting left nondominant side: Secondary | ICD-10-CM | POA: Diagnosis not present

## 2021-12-28 DIAGNOSIS — I119 Hypertensive heart disease without heart failure: Secondary | ICD-10-CM | POA: Diagnosis not present

## 2021-12-28 DIAGNOSIS — E7849 Other hyperlipidemia: Secondary | ICD-10-CM | POA: Diagnosis not present

## 2021-12-31 DIAGNOSIS — G4700 Insomnia, unspecified: Secondary | ICD-10-CM | POA: Diagnosis not present

## 2021-12-31 DIAGNOSIS — I119 Hypertensive heart disease without heart failure: Secondary | ICD-10-CM | POA: Diagnosis not present

## 2021-12-31 DIAGNOSIS — I6529 Occlusion and stenosis of unspecified carotid artery: Secondary | ICD-10-CM | POA: Diagnosis not present

## 2021-12-31 DIAGNOSIS — E7849 Other hyperlipidemia: Secondary | ICD-10-CM | POA: Diagnosis not present

## 2021-12-31 DIAGNOSIS — I63311 Cerebral infarction due to thrombosis of right middle cerebral artery: Secondary | ICD-10-CM | POA: Diagnosis not present

## 2021-12-31 DIAGNOSIS — R2681 Unsteadiness on feet: Secondary | ICD-10-CM | POA: Diagnosis not present

## 2021-12-31 DIAGNOSIS — G8194 Hemiplegia, unspecified affecting left nondominant side: Secondary | ICD-10-CM | POA: Diagnosis not present

## 2021-12-31 DIAGNOSIS — H5442A3 Blindness left eye category 3, normal vision right eye: Secondary | ICD-10-CM | POA: Diagnosis not present

## 2022-01-01 DIAGNOSIS — M545 Low back pain, unspecified: Secondary | ICD-10-CM | POA: Diagnosis not present

## 2022-01-01 DIAGNOSIS — I6529 Occlusion and stenosis of unspecified carotid artery: Secondary | ICD-10-CM | POA: Diagnosis not present

## 2022-01-01 DIAGNOSIS — E7849 Other hyperlipidemia: Secondary | ICD-10-CM | POA: Diagnosis not present

## 2022-01-01 DIAGNOSIS — H5442A3 Blindness left eye category 3, normal vision right eye: Secondary | ICD-10-CM | POA: Diagnosis not present

## 2022-01-01 DIAGNOSIS — G8194 Hemiplegia, unspecified affecting left nondominant side: Secondary | ICD-10-CM | POA: Diagnosis not present

## 2022-01-01 DIAGNOSIS — I119 Hypertensive heart disease without heart failure: Secondary | ICD-10-CM | POA: Diagnosis not present

## 2022-01-01 DIAGNOSIS — R2681 Unsteadiness on feet: Secondary | ICD-10-CM | POA: Diagnosis not present

## 2022-01-01 DIAGNOSIS — I63311 Cerebral infarction due to thrombosis of right middle cerebral artery: Secondary | ICD-10-CM | POA: Diagnosis not present

## 2022-01-01 DIAGNOSIS — G4700 Insomnia, unspecified: Secondary | ICD-10-CM | POA: Diagnosis not present

## 2022-01-05 DIAGNOSIS — G8194 Hemiplegia, unspecified affecting left nondominant side: Secondary | ICD-10-CM | POA: Diagnosis not present

## 2022-01-05 DIAGNOSIS — M545 Low back pain, unspecified: Secondary | ICD-10-CM | POA: Diagnosis not present

## 2022-01-05 DIAGNOSIS — I63311 Cerebral infarction due to thrombosis of right middle cerebral artery: Secondary | ICD-10-CM | POA: Diagnosis not present

## 2022-01-05 DIAGNOSIS — E7849 Other hyperlipidemia: Secondary | ICD-10-CM | POA: Diagnosis not present

## 2022-01-05 DIAGNOSIS — I6529 Occlusion and stenosis of unspecified carotid artery: Secondary | ICD-10-CM | POA: Diagnosis not present

## 2022-01-05 DIAGNOSIS — I119 Hypertensive heart disease without heart failure: Secondary | ICD-10-CM | POA: Diagnosis not present

## 2022-01-05 DIAGNOSIS — G4700 Insomnia, unspecified: Secondary | ICD-10-CM | POA: Diagnosis not present

## 2022-01-05 DIAGNOSIS — H5442A3 Blindness left eye category 3, normal vision right eye: Secondary | ICD-10-CM | POA: Diagnosis not present

## 2022-01-05 DIAGNOSIS — R2681 Unsteadiness on feet: Secondary | ICD-10-CM | POA: Diagnosis not present

## 2022-01-07 DIAGNOSIS — G4700 Insomnia, unspecified: Secondary | ICD-10-CM | POA: Diagnosis not present

## 2022-01-07 DIAGNOSIS — I119 Hypertensive heart disease without heart failure: Secondary | ICD-10-CM | POA: Diagnosis not present

## 2022-01-07 DIAGNOSIS — E7849 Other hyperlipidemia: Secondary | ICD-10-CM | POA: Diagnosis not present

## 2022-01-07 DIAGNOSIS — R2681 Unsteadiness on feet: Secondary | ICD-10-CM | POA: Diagnosis not present

## 2022-01-07 DIAGNOSIS — I63311 Cerebral infarction due to thrombosis of right middle cerebral artery: Secondary | ICD-10-CM | POA: Diagnosis not present

## 2022-01-07 DIAGNOSIS — M545 Low back pain, unspecified: Secondary | ICD-10-CM | POA: Diagnosis not present

## 2022-01-07 DIAGNOSIS — H5442A3 Blindness left eye category 3, normal vision right eye: Secondary | ICD-10-CM | POA: Diagnosis not present

## 2022-01-07 DIAGNOSIS — G8194 Hemiplegia, unspecified affecting left nondominant side: Secondary | ICD-10-CM | POA: Diagnosis not present

## 2022-01-07 DIAGNOSIS — I6529 Occlusion and stenosis of unspecified carotid artery: Secondary | ICD-10-CM | POA: Diagnosis not present

## 2022-01-08 ENCOUNTER — Telehealth: Payer: Self-pay | Admitting: Nurse Practitioner

## 2022-01-08 NOTE — Telephone Encounter (Signed)
Left message for patient to call back and schedule Medicare Annual Wellness Visit (AWV) either virtually or in office.  Left both my jabber number [phone] and office number    Last AWV ;09/21/20  please schedule at anytime with [facility]

## 2022-01-08 NOTE — Telephone Encounter (Signed)
Patients daughter called.  She stated patient in facility right now and will call back once he is back home

## 2022-01-10 DIAGNOSIS — L853 Xerosis cutis: Secondary | ICD-10-CM | POA: Diagnosis not present

## 2022-01-10 DIAGNOSIS — W19XXXA Unspecified fall, initial encounter: Secondary | ICD-10-CM | POA: Diagnosis not present

## 2022-01-10 DIAGNOSIS — G4700 Insomnia, unspecified: Secondary | ICD-10-CM | POA: Diagnosis not present

## 2022-01-10 DIAGNOSIS — E785 Hyperlipidemia, unspecified: Secondary | ICD-10-CM | POA: Diagnosis not present

## 2022-01-10 DIAGNOSIS — Z8673 Personal history of transient ischemic attack (TIA), and cerebral infarction without residual deficits: Secondary | ICD-10-CM | POA: Diagnosis not present

## 2022-01-10 DIAGNOSIS — R1310 Dysphagia, unspecified: Secondary | ICD-10-CM | POA: Diagnosis not present

## 2022-01-10 DIAGNOSIS — I10 Essential (primary) hypertension: Secondary | ICD-10-CM | POA: Diagnosis not present

## 2022-01-11 DIAGNOSIS — I6529 Occlusion and stenosis of unspecified carotid artery: Secondary | ICD-10-CM | POA: Diagnosis not present

## 2022-01-11 DIAGNOSIS — G4700 Insomnia, unspecified: Secondary | ICD-10-CM | POA: Diagnosis not present

## 2022-01-11 DIAGNOSIS — I119 Hypertensive heart disease without heart failure: Secondary | ICD-10-CM | POA: Diagnosis not present

## 2022-01-11 DIAGNOSIS — R2681 Unsteadiness on feet: Secondary | ICD-10-CM | POA: Diagnosis not present

## 2022-01-11 DIAGNOSIS — G8194 Hemiplegia, unspecified affecting left nondominant side: Secondary | ICD-10-CM | POA: Diagnosis not present

## 2022-01-11 DIAGNOSIS — M545 Low back pain, unspecified: Secondary | ICD-10-CM | POA: Diagnosis not present

## 2022-01-11 DIAGNOSIS — E7849 Other hyperlipidemia: Secondary | ICD-10-CM | POA: Diagnosis not present

## 2022-01-11 DIAGNOSIS — H5442A3 Blindness left eye category 3, normal vision right eye: Secondary | ICD-10-CM | POA: Diagnosis not present

## 2022-01-11 DIAGNOSIS — I63311 Cerebral infarction due to thrombosis of right middle cerebral artery: Secondary | ICD-10-CM | POA: Diagnosis not present

## 2022-01-14 DIAGNOSIS — M545 Low back pain, unspecified: Secondary | ICD-10-CM | POA: Diagnosis not present

## 2022-01-14 DIAGNOSIS — G4700 Insomnia, unspecified: Secondary | ICD-10-CM | POA: Diagnosis not present

## 2022-01-17 ENCOUNTER — Other Ambulatory Visit: Payer: Self-pay | Admitting: *Deleted

## 2022-01-17 NOTE — Patient Outreach (Incomplete)

## 2022-01-21 DIAGNOSIS — E7849 Other hyperlipidemia: Secondary | ICD-10-CM | POA: Diagnosis not present

## 2022-01-21 DIAGNOSIS — G8194 Hemiplegia, unspecified affecting left nondominant side: Secondary | ICD-10-CM | POA: Diagnosis not present

## 2022-01-21 DIAGNOSIS — I6529 Occlusion and stenosis of unspecified carotid artery: Secondary | ICD-10-CM | POA: Diagnosis not present

## 2022-01-21 DIAGNOSIS — I119 Hypertensive heart disease without heart failure: Secondary | ICD-10-CM | POA: Diagnosis not present

## 2022-01-21 DIAGNOSIS — G4700 Insomnia, unspecified: Secondary | ICD-10-CM | POA: Diagnosis not present

## 2022-01-21 DIAGNOSIS — M545 Low back pain, unspecified: Secondary | ICD-10-CM | POA: Diagnosis not present

## 2022-01-21 DIAGNOSIS — I63311 Cerebral infarction due to thrombosis of right middle cerebral artery: Secondary | ICD-10-CM | POA: Diagnosis not present

## 2022-01-21 DIAGNOSIS — H5442A3 Blindness left eye category 3, normal vision right eye: Secondary | ICD-10-CM | POA: Diagnosis not present

## 2022-01-21 DIAGNOSIS — R2681 Unsteadiness on feet: Secondary | ICD-10-CM | POA: Diagnosis not present

## 2022-01-22 DIAGNOSIS — I69354 Hemiplegia and hemiparesis following cerebral infarction affecting left non-dominant side: Secondary | ICD-10-CM | POA: Diagnosis not present

## 2022-01-22 DIAGNOSIS — I10 Essential (primary) hypertension: Secondary | ICD-10-CM | POA: Diagnosis not present

## 2022-01-22 DIAGNOSIS — G4700 Insomnia, unspecified: Secondary | ICD-10-CM | POA: Diagnosis not present

## 2022-01-22 DIAGNOSIS — M792 Neuralgia and neuritis, unspecified: Secondary | ICD-10-CM | POA: Diagnosis not present

## 2022-01-22 DIAGNOSIS — E785 Hyperlipidemia, unspecified: Secondary | ICD-10-CM | POA: Diagnosis not present

## 2022-01-22 DIAGNOSIS — I679 Cerebrovascular disease, unspecified: Secondary | ICD-10-CM | POA: Diagnosis not present

## 2022-01-22 DIAGNOSIS — Z9181 History of falling: Secondary | ICD-10-CM | POA: Diagnosis not present

## 2022-01-22 DIAGNOSIS — K5901 Slow transit constipation: Secondary | ICD-10-CM | POA: Diagnosis not present

## 2022-01-22 DIAGNOSIS — R1310 Dysphagia, unspecified: Secondary | ICD-10-CM | POA: Diagnosis not present

## 2022-01-22 DIAGNOSIS — I779 Disorder of arteries and arterioles, unspecified: Secondary | ICD-10-CM | POA: Diagnosis not present

## 2022-01-22 DIAGNOSIS — Z9189 Other specified personal risk factors, not elsewhere classified: Secondary | ICD-10-CM | POA: Diagnosis not present

## 2022-01-23 DIAGNOSIS — I6529 Occlusion and stenosis of unspecified carotid artery: Secondary | ICD-10-CM | POA: Diagnosis not present

## 2022-01-23 DIAGNOSIS — E7849 Other hyperlipidemia: Secondary | ICD-10-CM | POA: Diagnosis not present

## 2022-01-23 DIAGNOSIS — I119 Hypertensive heart disease without heart failure: Secondary | ICD-10-CM | POA: Diagnosis not present

## 2022-01-23 DIAGNOSIS — I63311 Cerebral infarction due to thrombosis of right middle cerebral artery: Secondary | ICD-10-CM | POA: Diagnosis not present

## 2022-01-23 DIAGNOSIS — M545 Low back pain, unspecified: Secondary | ICD-10-CM | POA: Diagnosis not present

## 2022-01-23 DIAGNOSIS — G8194 Hemiplegia, unspecified affecting left nondominant side: Secondary | ICD-10-CM | POA: Diagnosis not present

## 2022-01-23 DIAGNOSIS — R2681 Unsteadiness on feet: Secondary | ICD-10-CM | POA: Diagnosis not present

## 2022-01-23 DIAGNOSIS — G4700 Insomnia, unspecified: Secondary | ICD-10-CM | POA: Diagnosis not present

## 2022-01-23 DIAGNOSIS — H5442A3 Blindness left eye category 3, normal vision right eye: Secondary | ICD-10-CM | POA: Diagnosis not present

## 2022-01-24 DIAGNOSIS — M545 Low back pain, unspecified: Secondary | ICD-10-CM | POA: Diagnosis not present

## 2022-01-24 DIAGNOSIS — G4700 Insomnia, unspecified: Secondary | ICD-10-CM | POA: Diagnosis not present

## 2022-01-24 DIAGNOSIS — E7849 Other hyperlipidemia: Secondary | ICD-10-CM | POA: Diagnosis not present

## 2022-01-24 DIAGNOSIS — G8194 Hemiplegia, unspecified affecting left nondominant side: Secondary | ICD-10-CM | POA: Diagnosis not present

## 2022-01-24 DIAGNOSIS — R2681 Unsteadiness on feet: Secondary | ICD-10-CM | POA: Diagnosis not present

## 2022-01-24 DIAGNOSIS — H5442A3 Blindness left eye category 3, normal vision right eye: Secondary | ICD-10-CM | POA: Diagnosis not present

## 2022-01-24 DIAGNOSIS — I63311 Cerebral infarction due to thrombosis of right middle cerebral artery: Secondary | ICD-10-CM | POA: Diagnosis not present

## 2022-01-24 DIAGNOSIS — I6529 Occlusion and stenosis of unspecified carotid artery: Secondary | ICD-10-CM | POA: Diagnosis not present

## 2022-01-24 DIAGNOSIS — I119 Hypertensive heart disease without heart failure: Secondary | ICD-10-CM | POA: Diagnosis not present

## 2022-01-28 ENCOUNTER — Ambulatory Visit: Payer: Medicare Other | Admitting: Nurse Practitioner

## 2022-01-28 DIAGNOSIS — I6529 Occlusion and stenosis of unspecified carotid artery: Secondary | ICD-10-CM | POA: Diagnosis not present

## 2022-01-28 DIAGNOSIS — I119 Hypertensive heart disease without heart failure: Secondary | ICD-10-CM | POA: Diagnosis not present

## 2022-01-28 DIAGNOSIS — G8194 Hemiplegia, unspecified affecting left nondominant side: Secondary | ICD-10-CM | POA: Diagnosis not present

## 2022-01-28 DIAGNOSIS — E7849 Other hyperlipidemia: Secondary | ICD-10-CM | POA: Diagnosis not present

## 2022-01-28 DIAGNOSIS — H5442A3 Blindness left eye category 3, normal vision right eye: Secondary | ICD-10-CM | POA: Diagnosis not present

## 2022-01-28 DIAGNOSIS — I63311 Cerebral infarction due to thrombosis of right middle cerebral artery: Secondary | ICD-10-CM | POA: Diagnosis not present

## 2022-01-28 DIAGNOSIS — R2681 Unsteadiness on feet: Secondary | ICD-10-CM | POA: Diagnosis not present

## 2022-01-28 DIAGNOSIS — M545 Low back pain, unspecified: Secondary | ICD-10-CM | POA: Diagnosis not present

## 2022-01-28 DIAGNOSIS — G4700 Insomnia, unspecified: Secondary | ICD-10-CM | POA: Diagnosis not present

## 2022-01-30 DIAGNOSIS — G8194 Hemiplegia, unspecified affecting left nondominant side: Secondary | ICD-10-CM | POA: Diagnosis not present

## 2022-01-30 DIAGNOSIS — I119 Hypertensive heart disease without heart failure: Secondary | ICD-10-CM | POA: Diagnosis not present

## 2022-01-30 DIAGNOSIS — M545 Low back pain, unspecified: Secondary | ICD-10-CM | POA: Diagnosis not present

## 2022-01-30 DIAGNOSIS — E7849 Other hyperlipidemia: Secondary | ICD-10-CM | POA: Diagnosis not present

## 2022-01-30 DIAGNOSIS — H5442A3 Blindness left eye category 3, normal vision right eye: Secondary | ICD-10-CM | POA: Diagnosis not present

## 2022-01-30 DIAGNOSIS — I63311 Cerebral infarction due to thrombosis of right middle cerebral artery: Secondary | ICD-10-CM | POA: Diagnosis not present

## 2022-01-30 DIAGNOSIS — G4700 Insomnia, unspecified: Secondary | ICD-10-CM | POA: Diagnosis not present

## 2022-01-30 DIAGNOSIS — I6529 Occlusion and stenosis of unspecified carotid artery: Secondary | ICD-10-CM | POA: Diagnosis not present

## 2022-01-30 DIAGNOSIS — R2681 Unsteadiness on feet: Secondary | ICD-10-CM | POA: Diagnosis not present

## 2022-01-31 DIAGNOSIS — M545 Low back pain, unspecified: Secondary | ICD-10-CM | POA: Diagnosis not present

## 2022-01-31 DIAGNOSIS — H5442A3 Blindness left eye category 3, normal vision right eye: Secondary | ICD-10-CM | POA: Diagnosis not present

## 2022-01-31 DIAGNOSIS — G4700 Insomnia, unspecified: Secondary | ICD-10-CM | POA: Diagnosis not present

## 2022-01-31 DIAGNOSIS — I6529 Occlusion and stenosis of unspecified carotid artery: Secondary | ICD-10-CM | POA: Diagnosis not present

## 2022-01-31 DIAGNOSIS — I63311 Cerebral infarction due to thrombosis of right middle cerebral artery: Secondary | ICD-10-CM | POA: Diagnosis not present

## 2022-01-31 DIAGNOSIS — E7849 Other hyperlipidemia: Secondary | ICD-10-CM | POA: Diagnosis not present

## 2022-01-31 DIAGNOSIS — G8194 Hemiplegia, unspecified affecting left nondominant side: Secondary | ICD-10-CM | POA: Diagnosis not present

## 2022-01-31 DIAGNOSIS — I119 Hypertensive heart disease without heart failure: Secondary | ICD-10-CM | POA: Diagnosis not present

## 2022-01-31 DIAGNOSIS — R2681 Unsteadiness on feet: Secondary | ICD-10-CM | POA: Diagnosis not present

## 2022-02-04 ENCOUNTER — Other Ambulatory Visit: Payer: Self-pay | Admitting: Nurse Practitioner

## 2022-02-04 ENCOUNTER — Inpatient Hospital Stay: Payer: Medicare Other | Admitting: Neurology

## 2022-02-04 DIAGNOSIS — I6529 Occlusion and stenosis of unspecified carotid artery: Secondary | ICD-10-CM | POA: Diagnosis not present

## 2022-02-04 DIAGNOSIS — G4700 Insomnia, unspecified: Secondary | ICD-10-CM | POA: Diagnosis not present

## 2022-02-04 DIAGNOSIS — G8194 Hemiplegia, unspecified affecting left nondominant side: Secondary | ICD-10-CM | POA: Diagnosis not present

## 2022-02-04 DIAGNOSIS — I119 Hypertensive heart disease without heart failure: Secondary | ICD-10-CM | POA: Diagnosis not present

## 2022-02-04 DIAGNOSIS — I63311 Cerebral infarction due to thrombosis of right middle cerebral artery: Secondary | ICD-10-CM | POA: Diagnosis not present

## 2022-02-04 DIAGNOSIS — M545 Low back pain, unspecified: Secondary | ICD-10-CM | POA: Diagnosis not present

## 2022-02-04 DIAGNOSIS — H5442A3 Blindness left eye category 3, normal vision right eye: Secondary | ICD-10-CM | POA: Diagnosis not present

## 2022-02-04 DIAGNOSIS — R2681 Unsteadiness on feet: Secondary | ICD-10-CM | POA: Diagnosis not present

## 2022-02-04 DIAGNOSIS — E7849 Other hyperlipidemia: Secondary | ICD-10-CM | POA: Diagnosis not present

## 2022-02-04 MED ORDER — LISINOPRIL 2.5 MG PO TABS: 2.5000 mg | ORAL_TABLET | Freq: Every day | ORAL | 0 refills | Status: DC

## 2022-02-05 ENCOUNTER — Encounter: Payer: Self-pay | Admitting: Physical Medicine & Rehabilitation

## 2022-02-05 ENCOUNTER — Encounter: Payer: Medicare Other | Attending: Registered Nurse | Admitting: Physical Medicine & Rehabilitation

## 2022-02-05 VITALS — BP 126/75 | HR 78 | Ht 64.0 in

## 2022-02-05 DIAGNOSIS — I63511 Cerebral infarction due to unspecified occlusion or stenosis of right middle cerebral artery: Secondary | ICD-10-CM
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
## 2022-02-05 NOTE — Patient Instructions (Signed)
SARNA lotion for itching

## 2022-02-06 DIAGNOSIS — G8194 Hemiplegia, unspecified affecting left nondominant side: Secondary | ICD-10-CM | POA: Diagnosis not present

## 2022-02-06 DIAGNOSIS — I119 Hypertensive heart disease without heart failure: Secondary | ICD-10-CM | POA: Diagnosis not present

## 2022-02-06 DIAGNOSIS — I63311 Cerebral infarction due to thrombosis of right middle cerebral artery: Secondary | ICD-10-CM | POA: Diagnosis not present

## 2022-02-06 DIAGNOSIS — H5442A3 Blindness left eye category 3, normal vision right eye: Secondary | ICD-10-CM | POA: Diagnosis not present

## 2022-02-06 DIAGNOSIS — R2681 Unsteadiness on feet: Secondary | ICD-10-CM | POA: Diagnosis not present

## 2022-02-06 DIAGNOSIS — E7849 Other hyperlipidemia: Secondary | ICD-10-CM | POA: Diagnosis not present

## 2022-02-06 DIAGNOSIS — I6529 Occlusion and stenosis of unspecified carotid artery: Secondary | ICD-10-CM | POA: Diagnosis not present

## 2022-02-06 DIAGNOSIS — M545 Low back pain, unspecified: Secondary | ICD-10-CM | POA: Diagnosis not present

## 2022-02-06 DIAGNOSIS — G4700 Insomnia, unspecified: Secondary | ICD-10-CM | POA: Diagnosis not present

## 2022-02-14 ENCOUNTER — Other Ambulatory Visit: Payer: Self-pay | Admitting: *Deleted

## 2022-02-14 NOTE — Patient Outreach (Signed)
[facility] Post- Acute Care Coordinator follow up. Per [place] eligible member currently resides in [facility] and [place].  Screened for potential [facility] care management/care coordination services as a benefit of member's insurance plan and PCP with [facility].  Facility site visit to [facility] [place] facility. Met with Kathrynn Running, SNF social workers, and Verdis Frederickson, Marketing executive concerning member's progress and transition plans. Verdis Frederickson reports Mr. Chavous will be at wheel chair level upon SNF discharge. States he continues to progress with therapy. He propels independently with wheelchair and needs contact guard for wheelchair transfers. Family will need caregiver training. Mr. Pooley remains very motivated. Verdis Frederickson states Mr. Barnick will benefit from outpatient rehab if family will be able to take him post SNF discharge. Family meeting to be scheduled.   Will continue to follow while Mr. Queenan remains in SNF. Will plan outreach to Mr. Chisolm daughter closer to SNF discharge.   Marthenia Rolling, MSN, RN,BSN [place] Acute Care Coordinator [phone] [facility]) [phone]  (Toll free office)

## 2022-02-18 DIAGNOSIS — I63311 Cerebral infarction due to thrombosis of right middle cerebral artery: Secondary | ICD-10-CM | POA: Diagnosis not present

## 2022-02-18 DIAGNOSIS — G4700 Insomnia, unspecified: Secondary | ICD-10-CM | POA: Diagnosis not present

## 2022-02-18 DIAGNOSIS — I6529 Occlusion and stenosis of unspecified carotid artery: Secondary | ICD-10-CM | POA: Diagnosis not present

## 2022-02-18 DIAGNOSIS — M545 Low back pain, unspecified: Secondary | ICD-10-CM | POA: Diagnosis not present

## 2022-02-18 DIAGNOSIS — I119 Hypertensive heart disease without heart failure: Secondary | ICD-10-CM | POA: Diagnosis not present

## 2022-02-18 DIAGNOSIS — I10 Essential (primary) hypertension: Secondary | ICD-10-CM | POA: Diagnosis not present

## 2022-02-18 DIAGNOSIS — R2681 Unsteadiness on feet: Secondary | ICD-10-CM | POA: Diagnosis not present

## 2022-02-18 DIAGNOSIS — H5442A3 Blindness left eye category 3, normal vision right eye: Secondary | ICD-10-CM | POA: Diagnosis not present

## 2022-02-18 DIAGNOSIS — E7849 Other hyperlipidemia: Secondary | ICD-10-CM | POA: Diagnosis not present

## 2022-02-18 DIAGNOSIS — I69354 Hemiplegia and hemiparesis following cerebral infarction affecting left non-dominant side: Secondary | ICD-10-CM | POA: Diagnosis not present

## 2022-02-18 DIAGNOSIS — G8194 Hemiplegia, unspecified affecting left nondominant side: Secondary | ICD-10-CM | POA: Diagnosis not present

## 2022-02-18 DIAGNOSIS — M792 Neuralgia and neuritis, unspecified: Secondary | ICD-10-CM | POA: Diagnosis not present

## 2022-02-20 DIAGNOSIS — I63311 Cerebral infarction due to thrombosis of right middle cerebral artery: Secondary | ICD-10-CM | POA: Diagnosis not present

## 2022-02-20 DIAGNOSIS — G4700 Insomnia, unspecified: Secondary | ICD-10-CM | POA: Diagnosis not present

## 2022-02-20 DIAGNOSIS — E7849 Other hyperlipidemia: Secondary | ICD-10-CM | POA: Diagnosis not present

## 2022-02-20 DIAGNOSIS — H5442A3 Blindness left eye category 3, normal vision right eye: Secondary | ICD-10-CM | POA: Diagnosis not present

## 2022-02-20 DIAGNOSIS — I6529 Occlusion and stenosis of unspecified carotid artery: Secondary | ICD-10-CM | POA: Diagnosis not present

## 2022-02-20 DIAGNOSIS — I119 Hypertensive heart disease without heart failure: Secondary | ICD-10-CM | POA: Diagnosis not present

## 2022-02-20 DIAGNOSIS — G8194 Hemiplegia, unspecified affecting left nondominant side: Secondary | ICD-10-CM | POA: Diagnosis not present

## 2022-02-20 DIAGNOSIS — R2681 Unsteadiness on feet: Secondary | ICD-10-CM | POA: Diagnosis not present

## 2022-02-20 DIAGNOSIS — M545 Low back pain, unspecified: Secondary | ICD-10-CM | POA: Diagnosis not present

## 2022-02-21 ENCOUNTER — Ambulatory Visit (INDEPENDENT_AMBULATORY_CARE_PROVIDER_SITE_OTHER): Payer: Medicare Other | Admitting: Neurology

## 2022-02-21 ENCOUNTER — Encounter: Payer: Self-pay | Admitting: Neurology

## 2022-02-21 ENCOUNTER — Other Ambulatory Visit: Payer: Self-pay | Admitting: *Deleted

## 2022-02-21 VITALS — BP 166/89 | HR 64 | Ht 62.0 in | Wt 134.0 lb

## 2022-02-21 DIAGNOSIS — Z9181 History of falling: Secondary | ICD-10-CM | POA: Diagnosis not present

## 2022-02-21 DIAGNOSIS — G8194 Hemiplegia, unspecified affecting left nondominant side: Secondary | ICD-10-CM | POA: Diagnosis not present

## 2022-02-21 DIAGNOSIS — I119 Hypertensive heart disease without heart failure: Secondary | ICD-10-CM | POA: Diagnosis not present

## 2022-02-21 DIAGNOSIS — G4700 Insomnia, unspecified: Secondary | ICD-10-CM | POA: Diagnosis not present

## 2022-02-21 DIAGNOSIS — I6521 Occlusion and stenosis of right carotid artery: Secondary | ICD-10-CM | POA: Diagnosis not present

## 2022-02-21 DIAGNOSIS — R2681 Unsteadiness on feet: Secondary | ICD-10-CM | POA: Diagnosis not present

## 2022-02-21 DIAGNOSIS — H5442A3 Blindness left eye category 3, normal vision right eye: Secondary | ICD-10-CM | POA: Diagnosis not present

## 2022-02-21 DIAGNOSIS — I63311 Cerebral infarction due to thrombosis of right middle cerebral artery: Secondary | ICD-10-CM | POA: Diagnosis not present

## 2022-02-21 DIAGNOSIS — E782 Mixed hyperlipidemia: Secondary | ICD-10-CM | POA: Diagnosis not present

## 2022-02-21 DIAGNOSIS — I6529 Occlusion and stenosis of unspecified carotid artery: Secondary | ICD-10-CM | POA: Diagnosis not present

## 2022-02-21 DIAGNOSIS — M629 Disorder of muscle, unspecified: Secondary | ICD-10-CM | POA: Diagnosis not present

## 2022-02-21 DIAGNOSIS — I672 Cerebral atherosclerosis: Secondary | ICD-10-CM | POA: Diagnosis not present

## 2022-02-21 DIAGNOSIS — M545 Low back pain, unspecified: Secondary | ICD-10-CM | POA: Diagnosis not present

## 2022-02-21 DIAGNOSIS — H53462 Homonymous bilateral field defects, left side: Secondary | ICD-10-CM | POA: Diagnosis not present

## 2022-02-21 DIAGNOSIS — E7849 Other hyperlipidemia: Secondary | ICD-10-CM | POA: Diagnosis not present

## 2022-02-21 NOTE — Patient Outreach (Signed)
[facility] Post- Acute Care Coordinator follow up. Per [place] eligible member currently resides in [facility].  Screening for potential [facility] care coordination/care management services as a benefit of Mr. Clayson insurance plan and PCP with [facility].  Met with [place]irstin and [place] SNF social workers at [employer]. Kirstin reports discharge care plan meeting scheduled for Monday. Therapy recommends outpatient therapy post SNF.   Will plan follow up with Mr. Kiester daughter to discuss [facility] follow up.   Will continue to follow.    Marthenia Rolling, MSN, RN,BSN [place] Acute Care Coordinator [phone] [facility]) [phone]  (Toll free office)

## 2022-02-21 NOTE — Patient Instructions (Signed)
I had a long d/w patient and his daughter using [place] language interpreter about his recent stroke, dense left hemiplegia and homonymous hemianopsia and carotid occlusion, risk for recurrent stroke/TIAs, personally independently reviewed imaging studies and stroke evaluation results and answered questions.Continue Plavix 75 mg daily for secondary stroke prevention and maintain strict control of hypertension with blood pressure goal below 130/90, diabetes with hemoglobin A1c goal below 6.5% and lipids with LDL cholesterol goal below 70 mg/dL. I also advised the patient to eat a healthy diet with plenty of whole grains, cereals, fruits and vegetables, exercise regularly and maintain ideal body weight .continue ongoing physical and occupational therapy.  Check follow-up lipid profile, TSH and CBC today.  Followup in the future with my nurse practitioner in 3 months or call earlier if necessary. Stroke Prevention Some medical conditions and behaviors can lead to a higher chance of having a stroke. You can help prevent a stroke by eating healthy, exercising, not smoking, and managing any medical conditions you have. Stroke is a leading cause of functional impairment. Primary prevention is particularly important because a majority of strokes are first-time events. Stroke changes the lives of not only those who experience a stroke but also their family and other caregivers. How can this condition affect me? A stroke is a medical emergency and should be treated right away. A stroke can lead to brain damage and can sometimes be life-threatening. If a person gets medical treatment right away, there is a better chance of surviving and recovering from a stroke. What can increase my risk? The following medical conditions may increase your risk of a stroke: Cardiovascular disease. High blood pressure (hypertension). Diabetes. High cholesterol. Sickle cell disease. Blood clotting disorders (hypercoagulable  state). Obesity. Sleep disorders (obstructive sleep apnea). Other risk factors include: Being older than age 51. Having a history of blood clots, stroke, or mini-stroke (transient ischemic attack, TIA). Genetic factors, such as race, ethnicity, or a family history of stroke. Smoking cigarettes or using other tobacco products. Taking birth control pills, especially if you also use tobacco. Heavy use of alcohol or drugs, especially cocaine and methamphetamine. Physical inactivity. What actions can I take to prevent this? Manage your health conditions High cholesterol levels. Eating a healthy diet is important for preventing high cholesterol. If cholesterol cannot be managed through diet alone, you may need to take medicines. Take any prescribed medicines to control your cholesterol as told by your health care provider. Hypertension. To reduce your risk of stroke, try to keep your blood pressure below 130/80. Eating a healthy diet and exercising regularly are important for controlling blood pressure. If these steps are not enough to manage your blood pressure, you may need to take medicines. Take any prescribed medicines to control hypertension as told by your health care provider. Ask your health care provider if you should monitor your blood pressure at home. Have your blood pressure checked every year, even if your blood pressure is normal. Blood pressure increases with age and some medical conditions. Diabetes. Eating a healthy diet and exercising regularly are important parts of managing your blood sugar (glucose). If your blood sugar cannot be managed through diet and exercise, you may need to take medicines. Take any prescribed medicines to control your diabetes as told by your health care provider. Get evaluated for obstructive sleep apnea. Talk to your health care provider about getting a sleep evaluation if you snore a lot or have excessive sleepiness. Make sure that any other  medical conditions you have,  such as atrial fibrillation or atherosclerosis, are managed. Nutrition Follow instructions from your health care provider about what to eat or drink to help manage your health condition. These instructions may include: Reducing your daily calorie intake. Limiting how much salt (sodium) you use to 1,500 milligrams (mg) each day. Using only healthy fats for cooking, such as olive oil, canola oil, or sunflower oil. Eating healthy foods. You can do this by: Choosing foods that are high in fiber, such as whole grains, and fresh fruits and vegetables. Eating at least 5 servings of fruits and vegetables a day. Try to fill one-half of your plate with fruits and vegetables at each meal. Choosing lean protein foods, such as lean cuts of meat, poultry without skin, fish, tofu, beans, and nuts. Eating low-fat dairy products. Avoiding foods that are high in sodium. This can help lower blood pressure. Avoiding foods that have saturated fat, trans fat, and cholesterol. This can help prevent high cholesterol. Avoiding processed and prepared foods. Counting your daily carbohydrate intake.  Lifestyle If you drink alcohol: Limit how much you have to: 0-1 drink a day for women who are not pregnant. 0-2 drinks a day for men. Know how much alcohol is in your drink. In the [place] one drink equals one 12 oz bottle of beer ( ), one 5 oz glass of wine ( ), or one 1 oz glass of hard liquor (79mL). Do not use any products that contain nicotine or tobacco. These products include cigarettes, chewing tobacco, and vaping devices, such as e-cigarettes. If you need help quitting, ask your health care provider. Avoid secondhand smoke. Do not use drugs. Activity  Try to stay at a healthy weight. Get at least 30 minutes of exercise on most days, such as: Fast walking. Biking. Swimming. Medicines Take over-the-counter and prescription medicines only as told by your health care  provider. Aspirin or blood thinners (antiplatelets or anticoagulants) may be recommended to reduce your risk of forming blood clots that can lead to stroke. Avoid taking birth control pills. Talk to your health care provider about the risks of taking birth control pills if: You are over 34 years old. You smoke. You get very bad headaches. You have had a blood clot. Where to find more information American Stroke Association: www.strokeassociation.org Get help right away if: You or a loved one has any symptoms of a stroke. "BE FAST" is Jacob easy way to remember the main warning signs of a stroke: B - Balance. Signs are dizziness, sudden trouble walking, or loss of balance. E - Eyes. Signs are trouble seeing or a sudden change in vision. F - Face. Signs are sudden weakness or numbness of the face, or the face or eyelid drooping on one side. A - Arms. Signs are weakness or numbness in Wen arm. This happens suddenly and usually on one side of the body. S - Speech. Signs are sudden trouble speaking, slurred speech, or trouble understanding what people say. T - Time. Time to call emergency services. Write down what time symptoms started. You or a loved one has other signs of a stroke, such as: A sudden, severe headache with no known cause. Nausea or vomiting. Seizure. These symptoms may represent a serious problem that is [place] emergency. Do not wait to see if the symptoms will go away. Get medical help right away. Call your local emergency services (911 in the [place] Do not drive yourself to the hospital. Summary You can help to prevent a stroke by eating healthy, exercising,  not smoking, limiting alcohol intake, and managing any medical conditions you may have. Do not use any products that contain nicotine or tobacco. These include cigarettes, chewing tobacco, and vaping devices, such as e-cigarettes. If you need help quitting, ask your health care provider. Remember "BE FAST" for warning signs of a  stroke. Get help right away if you or a loved one has any of these signs. This information is not intended to replace advice given to you by your health care provider. Make sure you discuss any questions you have with your health care provider. Document Revised: 02/14/2020 Document Reviewed: 02/14/2020 Elsevier Patient Education  2023 [employer].
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
## 2022-02-22 DIAGNOSIS — I679 Cerebrovascular disease, unspecified: Secondary | ICD-10-CM | POA: Diagnosis not present

## 2022-02-22 DIAGNOSIS — H53469 Homonymous bilateral field defects, unspecified side: Secondary | ICD-10-CM | POA: Diagnosis not present

## 2022-02-22 DIAGNOSIS — I69354 Hemiplegia and hemiparesis following cerebral infarction affecting left non-dominant side: Secondary | ICD-10-CM | POA: Diagnosis not present

## 2022-02-22 DIAGNOSIS — I10 Essential (primary) hypertension: Secondary | ICD-10-CM | POA: Diagnosis not present

## 2022-02-22 DIAGNOSIS — R152 Fecal urgency: Secondary | ICD-10-CM | POA: Diagnosis not present

## 2022-02-22 DIAGNOSIS — I779 Disorder of arteries and arterioles, unspecified: Secondary | ICD-10-CM | POA: Diagnosis not present

## 2022-02-22 LAB — LIPID PANEL
Chol/HDL Ratio: 3.4 ratio (ref 0.0–5.0)
Cholesterol, Total: 137 mg/dL (ref 100–199)
HDL: 40 mg/dL
LDL Chol Calc (NIH): 71 mg/dL (ref 0–99)
Triglycerides: 151 mg/dL — ABNORMAL HIGH (ref 0–149)
VLDL Cholesterol Cal: 26 mg/dL (ref 5–40)

## 2022-02-22 LAB — CBC
Hematocrit: 41.9 % (ref 37.5–51.0)
Hemoglobin: 13.7 g/dL (ref 13.0–17.7)
MCH: 27.4 pg (ref 26.6–33.0)
MCHC: 32.7 g/dL (ref 31.5–35.7)
MCV: 84 fL (ref 79–97)
Platelets: 172 10*3/uL (ref 150–450)
RBC: 5 x10E6/uL (ref 4.14–5.80)
RDW: 14.1 % (ref 11.6–15.4)
WBC: 10.5 10*3/uL (ref 3.4–10.8)

## 2022-02-22 LAB — TSH: TSH: 1.43 u[IU]/mL (ref 0.450–4.500)

## 2022-02-26 DIAGNOSIS — G4700 Insomnia, unspecified: Secondary | ICD-10-CM | POA: Diagnosis not present

## 2022-02-26 DIAGNOSIS — I10 Essential (primary) hypertension: Secondary | ICD-10-CM | POA: Diagnosis not present

## 2022-02-26 DIAGNOSIS — I69354 Hemiplegia and hemiparesis following cerebral infarction affecting left non-dominant side: Secondary | ICD-10-CM | POA: Diagnosis not present

## 2022-02-28 ENCOUNTER — Other Ambulatory Visit: Payer: Self-pay | Admitting: *Deleted

## 2022-02-28 DIAGNOSIS — I69354 Hemiplegia and hemiparesis following cerebral infarction affecting left non-dominant side: Secondary | ICD-10-CM | POA: Diagnosis not present

## 2022-02-28 DIAGNOSIS — M249 Joint derangement, unspecified: Secondary | ICD-10-CM | POA: Diagnosis not present

## 2022-02-28 DIAGNOSIS — Z0189 Encounter for other specified special examinations: Secondary | ICD-10-CM | POA: Diagnosis not present

## 2022-02-28 NOTE — Patient Outreach (Signed)
[facility] Post- Acute Care Coordinator follow up. Per [place] eligible member currently resides in [facility].  Screening for potential [facility] care coordination/care management  services as a benefit of [employer] plan and PCP.  Facility site visit to [facility] skilled nursing facility. Met with Amaryllis Dyke, SNF social workers and Verdis Frederickson, [facility] sales representative. Douglas Pruitt is now walking with device to lift foot and hemwalker max assist. Continues to make progress. Reports hand is swollen now.  Transition plans pending now.  Last covered day is 03/08/22. May stay LTC vs returning home with daughter.  Will continue to follow.    Marthenia Rolling, MSN, RN,BSN [place] Acute Care Coordinator [phone] [facility]) [phone]  (Toll free office)

## 2022-03-04 DIAGNOSIS — I63311 Cerebral infarction due to thrombosis of right middle cerebral artery: Secondary | ICD-10-CM | POA: Diagnosis not present

## 2022-03-04 DIAGNOSIS — I6529 Occlusion and stenosis of unspecified carotid artery: Secondary | ICD-10-CM | POA: Diagnosis not present

## 2022-03-04 DIAGNOSIS — G8194 Hemiplegia, unspecified affecting left nondominant side: Secondary | ICD-10-CM | POA: Diagnosis not present

## 2022-03-04 DIAGNOSIS — R609 Edema, unspecified: Secondary | ICD-10-CM | POA: Diagnosis not present

## 2022-03-04 DIAGNOSIS — Z9181 History of falling: Secondary | ICD-10-CM | POA: Diagnosis not present

## 2022-03-04 DIAGNOSIS — H5442A3 Blindness left eye category 3, normal vision right eye: Secondary | ICD-10-CM | POA: Diagnosis not present

## 2022-03-04 DIAGNOSIS — I119 Hypertensive heart disease without heart failure: Secondary | ICD-10-CM | POA: Diagnosis not present

## 2022-03-04 DIAGNOSIS — M792 Neuralgia and neuritis, unspecified: Secondary | ICD-10-CM | POA: Diagnosis not present

## 2022-03-04 DIAGNOSIS — R2681 Unsteadiness on feet: Secondary | ICD-10-CM | POA: Diagnosis not present

## 2022-03-04 DIAGNOSIS — M545 Low back pain, unspecified: Secondary | ICD-10-CM | POA: Diagnosis not present

## 2022-03-04 DIAGNOSIS — I69354 Hemiplegia and hemiparesis following cerebral infarction affecting left non-dominant side: Secondary | ICD-10-CM | POA: Diagnosis not present

## 2022-03-04 DIAGNOSIS — E7849 Other hyperlipidemia: Secondary | ICD-10-CM | POA: Diagnosis not present

## 2022-03-04 DIAGNOSIS — G4700 Insomnia, unspecified: Secondary | ICD-10-CM | POA: Diagnosis not present

## 2022-03-05 DIAGNOSIS — R2681 Unsteadiness on feet: Secondary | ICD-10-CM | POA: Diagnosis not present

## 2022-03-05 DIAGNOSIS — Z9181 History of falling: Secondary | ICD-10-CM | POA: Diagnosis not present

## 2022-03-05 DIAGNOSIS — I119 Hypertensive heart disease without heart failure: Secondary | ICD-10-CM | POA: Diagnosis not present

## 2022-03-05 DIAGNOSIS — I6529 Occlusion and stenosis of unspecified carotid artery: Secondary | ICD-10-CM | POA: Diagnosis not present

## 2022-03-05 DIAGNOSIS — E7849 Other hyperlipidemia: Secondary | ICD-10-CM | POA: Diagnosis not present

## 2022-03-05 DIAGNOSIS — G8194 Hemiplegia, unspecified affecting left nondominant side: Secondary | ICD-10-CM | POA: Diagnosis not present

## 2022-03-05 DIAGNOSIS — M6249 Contracture of muscle, multiple sites: Secondary | ICD-10-CM | POA: Diagnosis not present

## 2022-03-05 DIAGNOSIS — I63311 Cerebral infarction due to thrombosis of right middle cerebral artery: Secondary | ICD-10-CM | POA: Diagnosis not present

## 2022-03-05 DIAGNOSIS — H5442A3 Blindness left eye category 3, normal vision right eye: Secondary | ICD-10-CM | POA: Diagnosis not present

## 2022-03-05 DIAGNOSIS — G4700 Insomnia, unspecified: Secondary | ICD-10-CM | POA: Diagnosis not present

## 2022-03-05 DIAGNOSIS — M545 Low back pain, unspecified: Secondary | ICD-10-CM | POA: Diagnosis not present

## 2022-03-05 NOTE — Progress Notes (Signed)
Kindly call the patient to review lab results and she has not yet reviewed them in my chart

## 2022-03-07 ENCOUNTER — Other Ambulatory Visit: Payer: Self-pay | Admitting: *Deleted

## 2022-03-07 DIAGNOSIS — G8194 Hemiplegia, unspecified affecting left nondominant side: Secondary | ICD-10-CM | POA: Diagnosis not present

## 2022-03-07 DIAGNOSIS — G4700 Insomnia, unspecified: Secondary | ICD-10-CM | POA: Diagnosis not present

## 2022-03-07 DIAGNOSIS — R2681 Unsteadiness on feet: Secondary | ICD-10-CM | POA: Diagnosis not present

## 2022-03-07 DIAGNOSIS — I6529 Occlusion and stenosis of unspecified carotid artery: Secondary | ICD-10-CM | POA: Diagnosis not present

## 2022-03-07 DIAGNOSIS — E7849 Other hyperlipidemia: Secondary | ICD-10-CM | POA: Diagnosis not present

## 2022-03-07 DIAGNOSIS — Z9181 History of falling: Secondary | ICD-10-CM | POA: Diagnosis not present

## 2022-03-07 DIAGNOSIS — H5442A3 Blindness left eye category 3, normal vision right eye: Secondary | ICD-10-CM | POA: Diagnosis not present

## 2022-03-07 DIAGNOSIS — I63311 Cerebral infarction due to thrombosis of right middle cerebral artery: Secondary | ICD-10-CM | POA: Diagnosis not present

## 2022-03-07 DIAGNOSIS — I119 Hypertensive heart disease without heart failure: Secondary | ICD-10-CM | POA: Diagnosis not present

## 2022-03-07 DIAGNOSIS — M545 Low back pain, unspecified: Secondary | ICD-10-CM | POA: Diagnosis not present

## 2022-03-07 DIAGNOSIS — M6249 Contracture of muscle, multiple sites: Secondary | ICD-10-CM | POA: Diagnosis not present

## 2022-03-07 NOTE — Patient Outreach (Signed)
[facility] Post- Acute Care Coordinator follow up. Douglas Pruitt remains in [facility].  Facility site visit to [facility] and [place]. Met with Amaryllis Dyke, SNF social workers and Verdis Frederickson, Civil engineer, contracting. Douglas Pruitt last covered day is 03/08/22. Douglas Pruitt has progressed so much that he has been able to ambulate with hemi-walker. Douglas Pruitt will remain under Medicaid at facility with Part B services for therapy. Douglas Pruitt will have used 100 SNF days.  No identifiable [facility] care management/care coordination needs at this time.    Marthenia Rolling, MSN, RN,BSN [place] Acute Care Coordinator [phone] [facility]) [phone]  (Toll free office)

## 2022-03-10 DIAGNOSIS — I69354 Hemiplegia and hemiparesis following cerebral infarction affecting left non-dominant side: Secondary | ICD-10-CM | POA: Diagnosis not present

## 2022-03-10 DIAGNOSIS — M6281 Muscle weakness (generalized): Secondary | ICD-10-CM | POA: Diagnosis not present

## 2022-03-10 DIAGNOSIS — I63511 Cerebral infarction due to unspecified occlusion or stenosis of right middle cerebral artery: Secondary | ICD-10-CM | POA: Diagnosis not present

## 2022-03-10 DIAGNOSIS — R2681 Unsteadiness on feet: Secondary | ICD-10-CM | POA: Diagnosis not present

## 2022-03-10 DIAGNOSIS — R278 Other lack of coordination: Secondary | ICD-10-CM | POA: Diagnosis not present

## 2022-03-11 DIAGNOSIS — I119 Hypertensive heart disease without heart failure: Secondary | ICD-10-CM | POA: Diagnosis not present

## 2022-03-11 DIAGNOSIS — R278 Other lack of coordination: Secondary | ICD-10-CM | POA: Diagnosis not present

## 2022-03-11 DIAGNOSIS — G4700 Insomnia, unspecified: Secondary | ICD-10-CM | POA: Diagnosis not present

## 2022-03-11 DIAGNOSIS — I63311 Cerebral infarction due to thrombosis of right middle cerebral artery: Secondary | ICD-10-CM | POA: Diagnosis not present

## 2022-03-11 DIAGNOSIS — M545 Low back pain, unspecified: Secondary | ICD-10-CM | POA: Diagnosis not present

## 2022-03-11 DIAGNOSIS — I63511 Cerebral infarction due to unspecified occlusion or stenosis of right middle cerebral artery: Secondary | ICD-10-CM | POA: Diagnosis not present

## 2022-03-11 DIAGNOSIS — G8194 Hemiplegia, unspecified affecting left nondominant side: Secondary | ICD-10-CM | POA: Diagnosis not present

## 2022-03-11 DIAGNOSIS — H5442A3 Blindness left eye category 3, normal vision right eye: Secondary | ICD-10-CM | POA: Diagnosis not present

## 2022-03-11 DIAGNOSIS — R2681 Unsteadiness on feet: Secondary | ICD-10-CM | POA: Diagnosis not present

## 2022-03-11 DIAGNOSIS — I6529 Occlusion and stenosis of unspecified carotid artery: Secondary | ICD-10-CM | POA: Diagnosis not present

## 2022-03-11 DIAGNOSIS — Z9181 History of falling: Secondary | ICD-10-CM | POA: Diagnosis not present

## 2022-03-11 DIAGNOSIS — M6249 Contracture of muscle, multiple sites: Secondary | ICD-10-CM | POA: Diagnosis not present

## 2022-03-11 DIAGNOSIS — E7849 Other hyperlipidemia: Secondary | ICD-10-CM | POA: Diagnosis not present

## 2022-03-11 DIAGNOSIS — M6281 Muscle weakness (generalized): Secondary | ICD-10-CM | POA: Diagnosis not present

## 2022-03-11 DIAGNOSIS — I69354 Hemiplegia and hemiparesis following cerebral infarction affecting left non-dominant side: Secondary | ICD-10-CM | POA: Diagnosis not present

## 2022-03-12 DIAGNOSIS — M6281 Muscle weakness (generalized): Secondary | ICD-10-CM | POA: Diagnosis not present

## 2022-03-12 DIAGNOSIS — R278 Other lack of coordination: Secondary | ICD-10-CM | POA: Diagnosis not present

## 2022-03-12 DIAGNOSIS — I63511 Cerebral infarction due to unspecified occlusion or stenosis of right middle cerebral artery: Secondary | ICD-10-CM | POA: Diagnosis not present

## 2022-03-12 DIAGNOSIS — R2681 Unsteadiness on feet: Secondary | ICD-10-CM | POA: Diagnosis not present

## 2022-03-12 DIAGNOSIS — I69354 Hemiplegia and hemiparesis following cerebral infarction affecting left non-dominant side: Secondary | ICD-10-CM | POA: Diagnosis not present

## 2022-03-13 DIAGNOSIS — R2681 Unsteadiness on feet: Secondary | ICD-10-CM | POA: Diagnosis not present

## 2022-03-13 DIAGNOSIS — M6281 Muscle weakness (generalized): Secondary | ICD-10-CM | POA: Diagnosis not present

## 2022-03-13 DIAGNOSIS — R278 Other lack of coordination: Secondary | ICD-10-CM | POA: Diagnosis not present

## 2022-03-13 DIAGNOSIS — I69354 Hemiplegia and hemiparesis following cerebral infarction affecting left non-dominant side: Secondary | ICD-10-CM | POA: Diagnosis not present

## 2022-03-13 DIAGNOSIS — I63511 Cerebral infarction due to unspecified occlusion or stenosis of right middle cerebral artery: Secondary | ICD-10-CM | POA: Diagnosis not present

## 2022-03-14 ENCOUNTER — Telehealth: Payer: Self-pay

## 2022-03-14 ENCOUNTER — Ambulatory Visit: Payer: Medicare Other

## 2022-03-14 DIAGNOSIS — R2681 Unsteadiness on feet: Secondary | ICD-10-CM | POA: Diagnosis not present

## 2022-03-14 DIAGNOSIS — M6281 Muscle weakness (generalized): Secondary | ICD-10-CM | POA: Diagnosis not present

## 2022-03-14 DIAGNOSIS — I69354 Hemiplegia and hemiparesis following cerebral infarction affecting left non-dominant side: Secondary | ICD-10-CM | POA: Diagnosis not present

## 2022-03-14 DIAGNOSIS — R278 Other lack of coordination: Secondary | ICD-10-CM | POA: Diagnosis not present

## 2022-03-14 DIAGNOSIS — I63511 Cerebral infarction due to unspecified occlusion or stenosis of right middle cerebral artery: Secondary | ICD-10-CM | POA: Diagnosis not present

## 2022-03-14 NOTE — Telephone Encounter (Signed)
This nurse attempted to call patient via interpreter. His daughter answered and stated that he is currently in a facility. She said that she will call back when he gets discharged to reschedule.

## 2022-03-16 DIAGNOSIS — R278 Other lack of coordination: Secondary | ICD-10-CM | POA: Diagnosis not present

## 2022-03-16 DIAGNOSIS — R2681 Unsteadiness on feet: Secondary | ICD-10-CM | POA: Diagnosis not present

## 2022-03-16 DIAGNOSIS — I69354 Hemiplegia and hemiparesis following cerebral infarction affecting left non-dominant side: Secondary | ICD-10-CM | POA: Diagnosis not present

## 2022-03-16 DIAGNOSIS — M6281 Muscle weakness (generalized): Secondary | ICD-10-CM | POA: Diagnosis not present

## 2022-03-16 DIAGNOSIS — I63511 Cerebral infarction due to unspecified occlusion or stenosis of right middle cerebral artery: Secondary | ICD-10-CM | POA: Diagnosis not present

## 2022-03-17 DIAGNOSIS — I69354 Hemiplegia and hemiparesis following cerebral infarction affecting left non-dominant side: Secondary | ICD-10-CM | POA: Diagnosis not present

## 2022-03-17 DIAGNOSIS — R278 Other lack of coordination: Secondary | ICD-10-CM | POA: Diagnosis not present

## 2022-03-17 DIAGNOSIS — I63511 Cerebral infarction due to unspecified occlusion or stenosis of right middle cerebral artery: Secondary | ICD-10-CM | POA: Diagnosis not present

## 2022-03-17 DIAGNOSIS — M6281 Muscle weakness (generalized): Secondary | ICD-10-CM | POA: Diagnosis not present

## 2022-03-17 DIAGNOSIS — R2681 Unsteadiness on feet: Secondary | ICD-10-CM | POA: Diagnosis not present

## 2022-03-18 ENCOUNTER — Ambulatory Visit: Payer: Medicare Other | Admitting: Nurse Practitioner

## 2022-03-18 DIAGNOSIS — I63511 Cerebral infarction due to unspecified occlusion or stenosis of right middle cerebral artery: Secondary | ICD-10-CM | POA: Diagnosis not present

## 2022-03-18 DIAGNOSIS — R278 Other lack of coordination: Secondary | ICD-10-CM | POA: Diagnosis not present

## 2022-03-18 DIAGNOSIS — M6281 Muscle weakness (generalized): Secondary | ICD-10-CM | POA: Diagnosis not present

## 2022-03-18 DIAGNOSIS — R2681 Unsteadiness on feet: Secondary | ICD-10-CM | POA: Diagnosis not present

## 2022-03-18 DIAGNOSIS — I69354 Hemiplegia and hemiparesis following cerebral infarction affecting left non-dominant side: Secondary | ICD-10-CM | POA: Diagnosis not present

## 2022-03-19 DIAGNOSIS — R2681 Unsteadiness on feet: Secondary | ICD-10-CM | POA: Diagnosis not present

## 2022-03-19 DIAGNOSIS — L603 Nail dystrophy: Secondary | ICD-10-CM | POA: Diagnosis not present

## 2022-03-19 DIAGNOSIS — I69354 Hemiplegia and hemiparesis following cerebral infarction affecting left non-dominant side: Secondary | ICD-10-CM | POA: Diagnosis not present

## 2022-03-19 DIAGNOSIS — R278 Other lack of coordination: Secondary | ICD-10-CM | POA: Diagnosis not present

## 2022-03-19 DIAGNOSIS — I63511 Cerebral infarction due to unspecified occlusion or stenosis of right middle cerebral artery: Secondary | ICD-10-CM | POA: Diagnosis not present

## 2022-03-19 DIAGNOSIS — Z89412 Acquired absence of left great toe: Secondary | ICD-10-CM | POA: Diagnosis not present

## 2022-03-19 DIAGNOSIS — B351 Tinea unguium: Secondary | ICD-10-CM | POA: Diagnosis not present

## 2022-03-19 DIAGNOSIS — I739 Peripheral vascular disease, unspecified: Secondary | ICD-10-CM | POA: Diagnosis not present

## 2022-03-19 DIAGNOSIS — M6281 Muscle weakness (generalized): Secondary | ICD-10-CM | POA: Diagnosis not present

## 2022-03-20 DIAGNOSIS — R278 Other lack of coordination: Secondary | ICD-10-CM | POA: Diagnosis not present

## 2022-03-20 DIAGNOSIS — I63511 Cerebral infarction due to unspecified occlusion or stenosis of right middle cerebral artery: Secondary | ICD-10-CM | POA: Diagnosis not present

## 2022-03-20 DIAGNOSIS — I69354 Hemiplegia and hemiparesis following cerebral infarction affecting left non-dominant side: Secondary | ICD-10-CM | POA: Diagnosis not present

## 2022-03-20 DIAGNOSIS — R2681 Unsteadiness on feet: Secondary | ICD-10-CM | POA: Diagnosis not present

## 2022-03-20 DIAGNOSIS — M6281 Muscle weakness (generalized): Secondary | ICD-10-CM | POA: Diagnosis not present

## 2022-03-21 DIAGNOSIS — R278 Other lack of coordination: Secondary | ICD-10-CM | POA: Diagnosis not present

## 2022-03-21 DIAGNOSIS — I63511 Cerebral infarction due to unspecified occlusion or stenosis of right middle cerebral artery: Secondary | ICD-10-CM | POA: Diagnosis not present

## 2022-03-21 DIAGNOSIS — M6281 Muscle weakness (generalized): Secondary | ICD-10-CM | POA: Diagnosis not present

## 2022-03-21 DIAGNOSIS — R2681 Unsteadiness on feet: Secondary | ICD-10-CM | POA: Diagnosis not present

## 2022-03-21 DIAGNOSIS — I69354 Hemiplegia and hemiparesis following cerebral infarction affecting left non-dominant side: Secondary | ICD-10-CM | POA: Diagnosis not present

## 2022-03-22 DIAGNOSIS — I6529 Occlusion and stenosis of unspecified carotid artery: Secondary | ICD-10-CM | POA: Diagnosis not present

## 2022-03-22 DIAGNOSIS — M545 Low back pain, unspecified: Secondary | ICD-10-CM | POA: Diagnosis not present

## 2022-03-22 DIAGNOSIS — M6249 Contracture of muscle, multiple sites: Secondary | ICD-10-CM | POA: Diagnosis not present

## 2022-03-22 DIAGNOSIS — R278 Other lack of coordination: Secondary | ICD-10-CM | POA: Diagnosis not present

## 2022-03-22 DIAGNOSIS — M6281 Muscle weakness (generalized): Secondary | ICD-10-CM | POA: Diagnosis not present

## 2022-03-22 DIAGNOSIS — G4700 Insomnia, unspecified: Secondary | ICD-10-CM | POA: Diagnosis not present

## 2022-03-22 DIAGNOSIS — Z9181 History of falling: Secondary | ICD-10-CM | POA: Diagnosis not present

## 2022-03-22 DIAGNOSIS — R2681 Unsteadiness on feet: Secondary | ICD-10-CM | POA: Diagnosis not present

## 2022-03-22 DIAGNOSIS — I63311 Cerebral infarction due to thrombosis of right middle cerebral artery: Secondary | ICD-10-CM | POA: Diagnosis not present

## 2022-03-22 DIAGNOSIS — I63511 Cerebral infarction due to unspecified occlusion or stenosis of right middle cerebral artery: Secondary | ICD-10-CM | POA: Diagnosis not present

## 2022-03-22 DIAGNOSIS — I119 Hypertensive heart disease without heart failure: Secondary | ICD-10-CM | POA: Diagnosis not present

## 2022-03-22 DIAGNOSIS — H5442A3 Blindness left eye category 3, normal vision right eye: Secondary | ICD-10-CM | POA: Diagnosis not present

## 2022-03-22 DIAGNOSIS — E7849 Other hyperlipidemia: Secondary | ICD-10-CM | POA: Diagnosis not present

## 2022-03-22 DIAGNOSIS — I69354 Hemiplegia and hemiparesis following cerebral infarction affecting left non-dominant side: Secondary | ICD-10-CM | POA: Diagnosis not present

## 2022-03-22 DIAGNOSIS — G8194 Hemiplegia, unspecified affecting left nondominant side: Secondary | ICD-10-CM | POA: Diagnosis not present

## 2022-03-23 DIAGNOSIS — I69354 Hemiplegia and hemiparesis following cerebral infarction affecting left non-dominant side: Secondary | ICD-10-CM | POA: Diagnosis not present

## 2022-03-23 DIAGNOSIS — R278 Other lack of coordination: Secondary | ICD-10-CM | POA: Diagnosis not present

## 2022-03-23 DIAGNOSIS — R2681 Unsteadiness on feet: Secondary | ICD-10-CM | POA: Diagnosis not present

## 2022-03-23 DIAGNOSIS — I63511 Cerebral infarction due to unspecified occlusion or stenosis of right middle cerebral artery: Secondary | ICD-10-CM | POA: Diagnosis not present

## 2022-03-23 DIAGNOSIS — M6281 Muscle weakness (generalized): Secondary | ICD-10-CM | POA: Diagnosis not present

## 2022-03-24 DIAGNOSIS — R278 Other lack of coordination: Secondary | ICD-10-CM | POA: Diagnosis not present

## 2022-03-24 DIAGNOSIS — I69354 Hemiplegia and hemiparesis following cerebral infarction affecting left non-dominant side: Secondary | ICD-10-CM | POA: Diagnosis not present

## 2022-03-24 DIAGNOSIS — M6281 Muscle weakness (generalized): Secondary | ICD-10-CM | POA: Diagnosis not present

## 2022-03-24 DIAGNOSIS — I63511 Cerebral infarction due to unspecified occlusion or stenosis of right middle cerebral artery: Secondary | ICD-10-CM | POA: Diagnosis not present

## 2022-03-24 DIAGNOSIS — R2681 Unsteadiness on feet: Secondary | ICD-10-CM | POA: Diagnosis not present

## 2022-03-25 DIAGNOSIS — R278 Other lack of coordination: Secondary | ICD-10-CM | POA: Diagnosis not present

## 2022-03-25 DIAGNOSIS — M6281 Muscle weakness (generalized): Secondary | ICD-10-CM | POA: Diagnosis not present

## 2022-03-25 DIAGNOSIS — R2681 Unsteadiness on feet: Secondary | ICD-10-CM | POA: Diagnosis not present

## 2022-03-25 DIAGNOSIS — I69354 Hemiplegia and hemiparesis following cerebral infarction affecting left non-dominant side: Secondary | ICD-10-CM | POA: Diagnosis not present

## 2022-03-25 DIAGNOSIS — I63511 Cerebral infarction due to unspecified occlusion or stenosis of right middle cerebral artery: Secondary | ICD-10-CM | POA: Diagnosis not present

## 2022-03-26 DIAGNOSIS — R2681 Unsteadiness on feet: Secondary | ICD-10-CM | POA: Diagnosis not present

## 2022-03-26 DIAGNOSIS — I63511 Cerebral infarction due to unspecified occlusion or stenosis of right middle cerebral artery: Secondary | ICD-10-CM | POA: Diagnosis not present

## 2022-03-26 DIAGNOSIS — R278 Other lack of coordination: Secondary | ICD-10-CM | POA: Diagnosis not present

## 2022-03-26 DIAGNOSIS — M6281 Muscle weakness (generalized): Secondary | ICD-10-CM | POA: Diagnosis not present

## 2022-03-26 DIAGNOSIS — I69354 Hemiplegia and hemiparesis following cerebral infarction affecting left non-dominant side: Secondary | ICD-10-CM | POA: Diagnosis not present

## 2022-03-27 DIAGNOSIS — I6529 Occlusion and stenosis of unspecified carotid artery: Secondary | ICD-10-CM | POA: Diagnosis not present

## 2022-03-27 DIAGNOSIS — G4700 Insomnia, unspecified: Secondary | ICD-10-CM | POA: Diagnosis not present

## 2022-03-27 DIAGNOSIS — R278 Other lack of coordination: Secondary | ICD-10-CM | POA: Diagnosis not present

## 2022-03-27 DIAGNOSIS — Z9181 History of falling: Secondary | ICD-10-CM | POA: Diagnosis not present

## 2022-03-27 DIAGNOSIS — E7849 Other hyperlipidemia: Secondary | ICD-10-CM | POA: Diagnosis not present

## 2022-03-27 DIAGNOSIS — M545 Low back pain, unspecified: Secondary | ICD-10-CM | POA: Diagnosis not present

## 2022-03-27 DIAGNOSIS — R2681 Unsteadiness on feet: Secondary | ICD-10-CM | POA: Diagnosis not present

## 2022-03-27 DIAGNOSIS — I63311 Cerebral infarction due to thrombosis of right middle cerebral artery: Secondary | ICD-10-CM | POA: Diagnosis not present

## 2022-03-27 DIAGNOSIS — G8194 Hemiplegia, unspecified affecting left nondominant side: Secondary | ICD-10-CM | POA: Diagnosis not present

## 2022-03-27 DIAGNOSIS — M6249 Contracture of muscle, multiple sites: Secondary | ICD-10-CM | POA: Diagnosis not present

## 2022-03-27 DIAGNOSIS — I69354 Hemiplegia and hemiparesis following cerebral infarction affecting left non-dominant side: Secondary | ICD-10-CM | POA: Diagnosis not present

## 2022-03-27 DIAGNOSIS — M6281 Muscle weakness (generalized): Secondary | ICD-10-CM | POA: Diagnosis not present

## 2022-03-27 DIAGNOSIS — I63511 Cerebral infarction due to unspecified occlusion or stenosis of right middle cerebral artery: Secondary | ICD-10-CM | POA: Diagnosis not present

## 2022-03-27 DIAGNOSIS — H5442A3 Blindness left eye category 3, normal vision right eye: Secondary | ICD-10-CM | POA: Diagnosis not present

## 2022-03-27 DIAGNOSIS — I119 Hypertensive heart disease without heart failure: Secondary | ICD-10-CM | POA: Diagnosis not present

## 2022-03-28 DIAGNOSIS — I69354 Hemiplegia and hemiparesis following cerebral infarction affecting left non-dominant side: Secondary | ICD-10-CM | POA: Diagnosis not present

## 2022-03-28 DIAGNOSIS — I63511 Cerebral infarction due to unspecified occlusion or stenosis of right middle cerebral artery: Secondary | ICD-10-CM | POA: Diagnosis not present

## 2022-03-28 DIAGNOSIS — R278 Other lack of coordination: Secondary | ICD-10-CM | POA: Diagnosis not present

## 2022-03-28 DIAGNOSIS — M6281 Muscle weakness (generalized): Secondary | ICD-10-CM | POA: Diagnosis not present

## 2022-03-28 DIAGNOSIS — R2681 Unsteadiness on feet: Secondary | ICD-10-CM | POA: Diagnosis not present

## 2022-03-29 DIAGNOSIS — R2681 Unsteadiness on feet: Secondary | ICD-10-CM | POA: Diagnosis not present

## 2022-03-29 DIAGNOSIS — R278 Other lack of coordination: Secondary | ICD-10-CM | POA: Diagnosis not present

## 2022-03-29 DIAGNOSIS — I63511 Cerebral infarction due to unspecified occlusion or stenosis of right middle cerebral artery: Secondary | ICD-10-CM | POA: Diagnosis not present

## 2022-03-29 DIAGNOSIS — I69354 Hemiplegia and hemiparesis following cerebral infarction affecting left non-dominant side: Secondary | ICD-10-CM | POA: Diagnosis not present

## 2022-03-29 DIAGNOSIS — M6281 Muscle weakness (generalized): Secondary | ICD-10-CM | POA: Diagnosis not present

## 2022-03-30 DIAGNOSIS — M6281 Muscle weakness (generalized): Secondary | ICD-10-CM | POA: Diagnosis not present

## 2022-03-30 DIAGNOSIS — I63511 Cerebral infarction due to unspecified occlusion or stenosis of right middle cerebral artery: Secondary | ICD-10-CM | POA: Diagnosis not present

## 2022-03-30 DIAGNOSIS — R2681 Unsteadiness on feet: Secondary | ICD-10-CM | POA: Diagnosis not present

## 2022-03-30 DIAGNOSIS — I69354 Hemiplegia and hemiparesis following cerebral infarction affecting left non-dominant side: Secondary | ICD-10-CM | POA: Diagnosis not present

## 2022-03-30 DIAGNOSIS — R278 Other lack of coordination: Secondary | ICD-10-CM | POA: Diagnosis not present

## 2022-03-31 DIAGNOSIS — I69354 Hemiplegia and hemiparesis following cerebral infarction affecting left non-dominant side: Secondary | ICD-10-CM | POA: Diagnosis not present

## 2022-03-31 DIAGNOSIS — R278 Other lack of coordination: Secondary | ICD-10-CM | POA: Diagnosis not present

## 2022-03-31 DIAGNOSIS — R2681 Unsteadiness on feet: Secondary | ICD-10-CM | POA: Diagnosis not present

## 2022-03-31 DIAGNOSIS — M6281 Muscle weakness (generalized): Secondary | ICD-10-CM | POA: Diagnosis not present

## 2022-03-31 DIAGNOSIS — I63511 Cerebral infarction due to unspecified occlusion or stenosis of right middle cerebral artery: Secondary | ICD-10-CM | POA: Diagnosis not present

## 2022-04-01 DIAGNOSIS — I69354 Hemiplegia and hemiparesis following cerebral infarction affecting left non-dominant side: Secondary | ICD-10-CM | POA: Diagnosis not present

## 2022-04-01 DIAGNOSIS — M6281 Muscle weakness (generalized): Secondary | ICD-10-CM | POA: Diagnosis not present

## 2022-04-01 DIAGNOSIS — R278 Other lack of coordination: Secondary | ICD-10-CM | POA: Diagnosis not present

## 2022-04-01 DIAGNOSIS — I63511 Cerebral infarction due to unspecified occlusion or stenosis of right middle cerebral artery: Secondary | ICD-10-CM | POA: Diagnosis not present

## 2022-04-01 DIAGNOSIS — R2681 Unsteadiness on feet: Secondary | ICD-10-CM | POA: Diagnosis not present

## 2022-04-02 DIAGNOSIS — I63511 Cerebral infarction due to unspecified occlusion or stenosis of right middle cerebral artery: Secondary | ICD-10-CM | POA: Diagnosis not present

## 2022-04-02 DIAGNOSIS — I69354 Hemiplegia and hemiparesis following cerebral infarction affecting left non-dominant side: Secondary | ICD-10-CM | POA: Diagnosis not present

## 2022-04-02 DIAGNOSIS — R2681 Unsteadiness on feet: Secondary | ICD-10-CM | POA: Diagnosis not present

## 2022-04-02 DIAGNOSIS — M6281 Muscle weakness (generalized): Secondary | ICD-10-CM | POA: Diagnosis not present

## 2022-04-02 DIAGNOSIS — R278 Other lack of coordination: Secondary | ICD-10-CM | POA: Diagnosis not present

## 2022-04-03 DIAGNOSIS — I63511 Cerebral infarction due to unspecified occlusion or stenosis of right middle cerebral artery: Secondary | ICD-10-CM | POA: Diagnosis not present

## 2022-04-03 DIAGNOSIS — I69354 Hemiplegia and hemiparesis following cerebral infarction affecting left non-dominant side: Secondary | ICD-10-CM | POA: Diagnosis not present

## 2022-04-03 DIAGNOSIS — M6281 Muscle weakness (generalized): Secondary | ICD-10-CM | POA: Diagnosis not present

## 2022-04-03 DIAGNOSIS — R278 Other lack of coordination: Secondary | ICD-10-CM | POA: Diagnosis not present

## 2022-04-03 DIAGNOSIS — R2681 Unsteadiness on feet: Secondary | ICD-10-CM | POA: Diagnosis not present

## 2022-04-04 DIAGNOSIS — I6529 Occlusion and stenosis of unspecified carotid artery: Secondary | ICD-10-CM | POA: Diagnosis not present

## 2022-04-04 DIAGNOSIS — G8194 Hemiplegia, unspecified affecting left nondominant side: Secondary | ICD-10-CM | POA: Diagnosis not present

## 2022-04-04 DIAGNOSIS — H5442A3 Blindness left eye category 3, normal vision right eye: Secondary | ICD-10-CM | POA: Diagnosis not present

## 2022-04-04 DIAGNOSIS — R278 Other lack of coordination: Secondary | ICD-10-CM | POA: Diagnosis not present

## 2022-04-04 DIAGNOSIS — R2681 Unsteadiness on feet: Secondary | ICD-10-CM | POA: Diagnosis not present

## 2022-04-04 DIAGNOSIS — I63311 Cerebral infarction due to thrombosis of right middle cerebral artery: Secondary | ICD-10-CM | POA: Diagnosis not present

## 2022-04-04 DIAGNOSIS — I69354 Hemiplegia and hemiparesis following cerebral infarction affecting left non-dominant side: Secondary | ICD-10-CM | POA: Diagnosis not present

## 2022-04-04 DIAGNOSIS — M6249 Contracture of muscle, multiple sites: Secondary | ICD-10-CM | POA: Diagnosis not present

## 2022-04-04 DIAGNOSIS — M545 Low back pain, unspecified: Secondary | ICD-10-CM | POA: Diagnosis not present

## 2022-04-04 DIAGNOSIS — G4700 Insomnia, unspecified: Secondary | ICD-10-CM | POA: Diagnosis not present

## 2022-04-04 DIAGNOSIS — I63511 Cerebral infarction due to unspecified occlusion or stenosis of right middle cerebral artery: Secondary | ICD-10-CM | POA: Diagnosis not present

## 2022-04-04 DIAGNOSIS — I119 Hypertensive heart disease without heart failure: Secondary | ICD-10-CM | POA: Diagnosis not present

## 2022-04-04 DIAGNOSIS — M6281 Muscle weakness (generalized): Secondary | ICD-10-CM | POA: Diagnosis not present

## 2022-04-04 DIAGNOSIS — E7849 Other hyperlipidemia: Secondary | ICD-10-CM | POA: Diagnosis not present

## 2022-04-04 DIAGNOSIS — Z9181 History of falling: Secondary | ICD-10-CM | POA: Diagnosis not present

## 2022-04-05 DIAGNOSIS — M6281 Muscle weakness (generalized): Secondary | ICD-10-CM | POA: Diagnosis not present

## 2022-04-05 DIAGNOSIS — R2681 Unsteadiness on feet: Secondary | ICD-10-CM | POA: Diagnosis not present

## 2022-04-05 DIAGNOSIS — I69354 Hemiplegia and hemiparesis following cerebral infarction affecting left non-dominant side: Secondary | ICD-10-CM | POA: Diagnosis not present

## 2022-04-05 DIAGNOSIS — R278 Other lack of coordination: Secondary | ICD-10-CM | POA: Diagnosis not present

## 2022-04-05 DIAGNOSIS — I63511 Cerebral infarction due to unspecified occlusion or stenosis of right middle cerebral artery: Secondary | ICD-10-CM | POA: Diagnosis not present

## 2022-04-07 DIAGNOSIS — R2681 Unsteadiness on feet: Secondary | ICD-10-CM | POA: Diagnosis not present

## 2022-04-07 DIAGNOSIS — M6281 Muscle weakness (generalized): Secondary | ICD-10-CM | POA: Diagnosis not present

## 2022-04-07 DIAGNOSIS — R278 Other lack of coordination: Secondary | ICD-10-CM | POA: Diagnosis not present

## 2022-04-07 DIAGNOSIS — I63511 Cerebral infarction due to unspecified occlusion or stenosis of right middle cerebral artery: Secondary | ICD-10-CM | POA: Diagnosis not present

## 2022-04-07 DIAGNOSIS — I69354 Hemiplegia and hemiparesis following cerebral infarction affecting left non-dominant side: Secondary | ICD-10-CM | POA: Diagnosis not present

## 2022-04-08 DIAGNOSIS — R2681 Unsteadiness on feet: Secondary | ICD-10-CM | POA: Diagnosis not present

## 2022-04-08 DIAGNOSIS — I69354 Hemiplegia and hemiparesis following cerebral infarction affecting left non-dominant side: Secondary | ICD-10-CM | POA: Diagnosis not present

## 2022-04-08 DIAGNOSIS — M6281 Muscle weakness (generalized): Secondary | ICD-10-CM | POA: Diagnosis not present

## 2022-04-08 DIAGNOSIS — R278 Other lack of coordination: Secondary | ICD-10-CM | POA: Diagnosis not present

## 2022-04-08 DIAGNOSIS — I63511 Cerebral infarction due to unspecified occlusion or stenosis of right middle cerebral artery: Secondary | ICD-10-CM | POA: Diagnosis not present

## 2022-04-09 ENCOUNTER — Ambulatory Visit: Payer: Medicare Other | Admitting: Physical Medicine & Rehabilitation

## 2022-04-09 DIAGNOSIS — R2681 Unsteadiness on feet: Secondary | ICD-10-CM | POA: Diagnosis not present

## 2022-04-09 DIAGNOSIS — I69354 Hemiplegia and hemiparesis following cerebral infarction affecting left non-dominant side: Secondary | ICD-10-CM | POA: Diagnosis not present

## 2022-04-09 DIAGNOSIS — R278 Other lack of coordination: Secondary | ICD-10-CM | POA: Diagnosis not present

## 2022-04-09 DIAGNOSIS — M6281 Muscle weakness (generalized): Secondary | ICD-10-CM | POA: Diagnosis not present

## 2022-04-09 DIAGNOSIS — I63511 Cerebral infarction due to unspecified occlusion or stenosis of right middle cerebral artery: Secondary | ICD-10-CM | POA: Diagnosis not present

## 2022-04-10 DIAGNOSIS — M6281 Muscle weakness (generalized): Secondary | ICD-10-CM | POA: Diagnosis not present

## 2022-04-10 DIAGNOSIS — I69354 Hemiplegia and hemiparesis following cerebral infarction affecting left non-dominant side: Secondary | ICD-10-CM | POA: Diagnosis not present

## 2022-04-10 DIAGNOSIS — R2681 Unsteadiness on feet: Secondary | ICD-10-CM | POA: Diagnosis not present

## 2022-04-10 DIAGNOSIS — I63511 Cerebral infarction due to unspecified occlusion or stenosis of right middle cerebral artery: Secondary | ICD-10-CM | POA: Diagnosis not present

## 2022-04-10 DIAGNOSIS — R278 Other lack of coordination: Secondary | ICD-10-CM | POA: Diagnosis not present

## 2022-04-11 DIAGNOSIS — R2681 Unsteadiness on feet: Secondary | ICD-10-CM | POA: Diagnosis not present

## 2022-04-11 DIAGNOSIS — I69354 Hemiplegia and hemiparesis following cerebral infarction affecting left non-dominant side: Secondary | ICD-10-CM | POA: Diagnosis not present

## 2022-04-11 DIAGNOSIS — M6281 Muscle weakness (generalized): Secondary | ICD-10-CM | POA: Diagnosis not present

## 2022-04-11 DIAGNOSIS — R278 Other lack of coordination: Secondary | ICD-10-CM | POA: Diagnosis not present

## 2022-04-11 DIAGNOSIS — I63511 Cerebral infarction due to unspecified occlusion or stenosis of right middle cerebral artery: Secondary | ICD-10-CM | POA: Diagnosis not present

## 2022-04-12 DIAGNOSIS — M6281 Muscle weakness (generalized): Secondary | ICD-10-CM | POA: Diagnosis not present

## 2022-04-12 DIAGNOSIS — R278 Other lack of coordination: Secondary | ICD-10-CM | POA: Diagnosis not present

## 2022-04-12 DIAGNOSIS — I63511 Cerebral infarction due to unspecified occlusion or stenosis of right middle cerebral artery: Secondary | ICD-10-CM | POA: Diagnosis not present

## 2022-04-12 DIAGNOSIS — I69354 Hemiplegia and hemiparesis following cerebral infarction affecting left non-dominant side: Secondary | ICD-10-CM | POA: Diagnosis not present

## 2022-04-12 DIAGNOSIS — R2681 Unsteadiness on feet: Secondary | ICD-10-CM | POA: Diagnosis not present

## 2022-04-13 DIAGNOSIS — I69354 Hemiplegia and hemiparesis following cerebral infarction affecting left non-dominant side: Secondary | ICD-10-CM | POA: Diagnosis not present

## 2022-04-13 DIAGNOSIS — M6281 Muscle weakness (generalized): Secondary | ICD-10-CM | POA: Diagnosis not present

## 2022-04-13 DIAGNOSIS — R2681 Unsteadiness on feet: Secondary | ICD-10-CM | POA: Diagnosis not present

## 2022-04-13 DIAGNOSIS — R278 Other lack of coordination: Secondary | ICD-10-CM | POA: Diagnosis not present

## 2022-04-13 DIAGNOSIS — I63511 Cerebral infarction due to unspecified occlusion or stenosis of right middle cerebral artery: Secondary | ICD-10-CM | POA: Diagnosis not present

## 2022-04-14 DIAGNOSIS — I69354 Hemiplegia and hemiparesis following cerebral infarction affecting left non-dominant side: Secondary | ICD-10-CM | POA: Diagnosis not present

## 2022-04-14 DIAGNOSIS — I63511 Cerebral infarction due to unspecified occlusion or stenosis of right middle cerebral artery: Secondary | ICD-10-CM | POA: Diagnosis not present

## 2022-04-14 DIAGNOSIS — R2681 Unsteadiness on feet: Secondary | ICD-10-CM | POA: Diagnosis not present

## 2022-04-14 DIAGNOSIS — M6281 Muscle weakness (generalized): Secondary | ICD-10-CM | POA: Diagnosis not present

## 2022-04-14 DIAGNOSIS — R278 Other lack of coordination: Secondary | ICD-10-CM | POA: Diagnosis not present

## 2022-04-15 DIAGNOSIS — R2681 Unsteadiness on feet: Secondary | ICD-10-CM | POA: Diagnosis not present

## 2022-04-15 DIAGNOSIS — I6529 Occlusion and stenosis of unspecified carotid artery: Secondary | ICD-10-CM | POA: Diagnosis not present

## 2022-04-15 DIAGNOSIS — H5442A3 Blindness left eye category 3, normal vision right eye: Secondary | ICD-10-CM | POA: Diagnosis not present

## 2022-04-15 DIAGNOSIS — E7849 Other hyperlipidemia: Secondary | ICD-10-CM | POA: Diagnosis not present

## 2022-04-15 DIAGNOSIS — Z9181 History of falling: Secondary | ICD-10-CM | POA: Diagnosis not present

## 2022-04-15 DIAGNOSIS — M6249 Contracture of muscle, multiple sites: Secondary | ICD-10-CM | POA: Diagnosis not present

## 2022-04-15 DIAGNOSIS — G8194 Hemiplegia, unspecified affecting left nondominant side: Secondary | ICD-10-CM | POA: Diagnosis not present

## 2022-04-15 DIAGNOSIS — R278 Other lack of coordination: Secondary | ICD-10-CM | POA: Diagnosis not present

## 2022-04-15 DIAGNOSIS — I119 Hypertensive heart disease without heart failure: Secondary | ICD-10-CM | POA: Diagnosis not present

## 2022-04-15 DIAGNOSIS — M545 Low back pain, unspecified: Secondary | ICD-10-CM | POA: Diagnosis not present

## 2022-04-15 DIAGNOSIS — I63311 Cerebral infarction due to thrombosis of right middle cerebral artery: Secondary | ICD-10-CM | POA: Diagnosis not present

## 2022-04-15 DIAGNOSIS — M6281 Muscle weakness (generalized): Secondary | ICD-10-CM | POA: Diagnosis not present

## 2022-04-15 DIAGNOSIS — I63511 Cerebral infarction due to unspecified occlusion or stenosis of right middle cerebral artery: Secondary | ICD-10-CM | POA: Diagnosis not present

## 2022-04-15 DIAGNOSIS — G4700 Insomnia, unspecified: Secondary | ICD-10-CM | POA: Diagnosis not present

## 2022-04-15 DIAGNOSIS — I69354 Hemiplegia and hemiparesis following cerebral infarction affecting left non-dominant side: Secondary | ICD-10-CM | POA: Diagnosis not present

## 2022-04-16 DIAGNOSIS — R2681 Unsteadiness on feet: Secondary | ICD-10-CM | POA: Diagnosis not present

## 2022-04-16 DIAGNOSIS — R278 Other lack of coordination: Secondary | ICD-10-CM | POA: Diagnosis not present

## 2022-04-16 DIAGNOSIS — I63511 Cerebral infarction due to unspecified occlusion or stenosis of right middle cerebral artery: Secondary | ICD-10-CM | POA: Diagnosis not present

## 2022-04-16 DIAGNOSIS — M6281 Muscle weakness (generalized): Secondary | ICD-10-CM | POA: Diagnosis not present

## 2022-04-16 DIAGNOSIS — I69354 Hemiplegia and hemiparesis following cerebral infarction affecting left non-dominant side: Secondary | ICD-10-CM | POA: Diagnosis not present

## 2022-04-17 DIAGNOSIS — I63511 Cerebral infarction due to unspecified occlusion or stenosis of right middle cerebral artery: Secondary | ICD-10-CM | POA: Diagnosis not present

## 2022-04-17 DIAGNOSIS — I69354 Hemiplegia and hemiparesis following cerebral infarction affecting left non-dominant side: Secondary | ICD-10-CM | POA: Diagnosis not present

## 2022-04-17 DIAGNOSIS — R2681 Unsteadiness on feet: Secondary | ICD-10-CM | POA: Diagnosis not present

## 2022-04-17 DIAGNOSIS — R278 Other lack of coordination: Secondary | ICD-10-CM | POA: Diagnosis not present

## 2022-04-17 DIAGNOSIS — M6281 Muscle weakness (generalized): Secondary | ICD-10-CM | POA: Diagnosis not present

## 2022-04-18 DIAGNOSIS — R2681 Unsteadiness on feet: Secondary | ICD-10-CM | POA: Diagnosis not present

## 2022-04-18 DIAGNOSIS — R278 Other lack of coordination: Secondary | ICD-10-CM | POA: Diagnosis not present

## 2022-04-18 DIAGNOSIS — M6281 Muscle weakness (generalized): Secondary | ICD-10-CM | POA: Diagnosis not present

## 2022-04-18 DIAGNOSIS — I63511 Cerebral infarction due to unspecified occlusion or stenosis of right middle cerebral artery: Secondary | ICD-10-CM | POA: Diagnosis not present

## 2022-04-18 DIAGNOSIS — I69354 Hemiplegia and hemiparesis following cerebral infarction affecting left non-dominant side: Secondary | ICD-10-CM | POA: Diagnosis not present

## 2022-04-19 DIAGNOSIS — I63511 Cerebral infarction due to unspecified occlusion or stenosis of right middle cerebral artery: Secondary | ICD-10-CM | POA: Diagnosis not present

## 2022-04-19 DIAGNOSIS — M6281 Muscle weakness (generalized): Secondary | ICD-10-CM | POA: Diagnosis not present

## 2022-04-19 DIAGNOSIS — R2681 Unsteadiness on feet: Secondary | ICD-10-CM | POA: Diagnosis not present

## 2022-04-19 DIAGNOSIS — R278 Other lack of coordination: Secondary | ICD-10-CM | POA: Diagnosis not present

## 2022-04-19 DIAGNOSIS — I69354 Hemiplegia and hemiparesis following cerebral infarction affecting left non-dominant side: Secondary | ICD-10-CM | POA: Diagnosis not present

## 2022-04-22 DIAGNOSIS — I69354 Hemiplegia and hemiparesis following cerebral infarction affecting left non-dominant side: Secondary | ICD-10-CM | POA: Diagnosis not present

## 2022-04-22 DIAGNOSIS — M6281 Muscle weakness (generalized): Secondary | ICD-10-CM | POA: Diagnosis not present

## 2022-04-22 DIAGNOSIS — F4320 Adjustment disorder, unspecified: Secondary | ICD-10-CM | POA: Diagnosis not present

## 2022-04-22 DIAGNOSIS — I63511 Cerebral infarction due to unspecified occlusion or stenosis of right middle cerebral artery: Secondary | ICD-10-CM | POA: Diagnosis not present

## 2022-04-22 DIAGNOSIS — F5101 Primary insomnia: Secondary | ICD-10-CM | POA: Diagnosis not present

## 2022-04-22 DIAGNOSIS — G3184 Mild cognitive impairment, so stated: Secondary | ICD-10-CM | POA: Diagnosis not present

## 2022-04-22 DIAGNOSIS — R278 Other lack of coordination: Secondary | ICD-10-CM | POA: Diagnosis not present

## 2022-04-22 DIAGNOSIS — R2681 Unsteadiness on feet: Secondary | ICD-10-CM | POA: Diagnosis not present

## 2022-04-23 DIAGNOSIS — R2681 Unsteadiness on feet: Secondary | ICD-10-CM | POA: Diagnosis not present

## 2022-04-23 DIAGNOSIS — R278 Other lack of coordination: Secondary | ICD-10-CM | POA: Diagnosis not present

## 2022-04-23 DIAGNOSIS — I63511 Cerebral infarction due to unspecified occlusion or stenosis of right middle cerebral artery: Secondary | ICD-10-CM | POA: Diagnosis not present

## 2022-04-23 DIAGNOSIS — I69354 Hemiplegia and hemiparesis following cerebral infarction affecting left non-dominant side: Secondary | ICD-10-CM | POA: Diagnosis not present

## 2022-04-23 DIAGNOSIS — M6281 Muscle weakness (generalized): Secondary | ICD-10-CM | POA: Diagnosis not present

## 2022-04-24 DIAGNOSIS — M6281 Muscle weakness (generalized): Secondary | ICD-10-CM | POA: Diagnosis not present

## 2022-04-24 DIAGNOSIS — I63511 Cerebral infarction due to unspecified occlusion or stenosis of right middle cerebral artery: Secondary | ICD-10-CM | POA: Diagnosis not present

## 2022-04-24 DIAGNOSIS — I69354 Hemiplegia and hemiparesis following cerebral infarction affecting left non-dominant side: Secondary | ICD-10-CM | POA: Diagnosis not present

## 2022-04-24 DIAGNOSIS — R2681 Unsteadiness on feet: Secondary | ICD-10-CM | POA: Diagnosis not present

## 2022-04-24 DIAGNOSIS — R278 Other lack of coordination: Secondary | ICD-10-CM | POA: Diagnosis not present

## 2022-04-25 DIAGNOSIS — G4700 Insomnia, unspecified: Secondary | ICD-10-CM | POA: Diagnosis not present

## 2022-04-25 DIAGNOSIS — H5442A3 Blindness left eye category 3, normal vision right eye: Secondary | ICD-10-CM | POA: Diagnosis not present

## 2022-04-25 DIAGNOSIS — I63311 Cerebral infarction due to thrombosis of right middle cerebral artery: Secondary | ICD-10-CM | POA: Diagnosis not present

## 2022-04-25 DIAGNOSIS — I63511 Cerebral infarction due to unspecified occlusion or stenosis of right middle cerebral artery: Secondary | ICD-10-CM | POA: Diagnosis not present

## 2022-04-25 DIAGNOSIS — I6529 Occlusion and stenosis of unspecified carotid artery: Secondary | ICD-10-CM | POA: Diagnosis not present

## 2022-04-25 DIAGNOSIS — Z9181 History of falling: Secondary | ICD-10-CM | POA: Diagnosis not present

## 2022-04-25 DIAGNOSIS — I69354 Hemiplegia and hemiparesis following cerebral infarction affecting left non-dominant side: Secondary | ICD-10-CM | POA: Diagnosis not present

## 2022-04-25 DIAGNOSIS — M545 Low back pain, unspecified: Secondary | ICD-10-CM | POA: Diagnosis not present

## 2022-04-25 DIAGNOSIS — E7849 Other hyperlipidemia: Secondary | ICD-10-CM | POA: Diagnosis not present

## 2022-04-25 DIAGNOSIS — M6249 Contracture of muscle, multiple sites: Secondary | ICD-10-CM | POA: Diagnosis not present

## 2022-04-25 DIAGNOSIS — G8194 Hemiplegia, unspecified affecting left nondominant side: Secondary | ICD-10-CM | POA: Diagnosis not present

## 2022-04-25 DIAGNOSIS — R2681 Unsteadiness on feet: Secondary | ICD-10-CM | POA: Diagnosis not present

## 2022-04-25 DIAGNOSIS — I119 Hypertensive heart disease without heart failure: Secondary | ICD-10-CM | POA: Diagnosis not present

## 2022-04-25 DIAGNOSIS — M6281 Muscle weakness (generalized): Secondary | ICD-10-CM | POA: Diagnosis not present

## 2022-04-25 DIAGNOSIS — R278 Other lack of coordination: Secondary | ICD-10-CM | POA: Diagnosis not present

## 2022-04-26 DIAGNOSIS — I63511 Cerebral infarction due to unspecified occlusion or stenosis of right middle cerebral artery: Secondary | ICD-10-CM | POA: Diagnosis not present

## 2022-04-26 DIAGNOSIS — I69354 Hemiplegia and hemiparesis following cerebral infarction affecting left non-dominant side: Secondary | ICD-10-CM | POA: Diagnosis not present

## 2022-04-26 DIAGNOSIS — M6281 Muscle weakness (generalized): Secondary | ICD-10-CM | POA: Diagnosis not present

## 2022-04-26 DIAGNOSIS — R278 Other lack of coordination: Secondary | ICD-10-CM | POA: Diagnosis not present

## 2022-04-26 DIAGNOSIS — R2681 Unsteadiness on feet: Secondary | ICD-10-CM | POA: Diagnosis not present

## 2022-04-29 DIAGNOSIS — Z23 Encounter for immunization: Secondary | ICD-10-CM | POA: Diagnosis not present

## 2022-04-29 DIAGNOSIS — I69354 Hemiplegia and hemiparesis following cerebral infarction affecting left non-dominant side: Secondary | ICD-10-CM | POA: Diagnosis not present

## 2022-04-29 DIAGNOSIS — R278 Other lack of coordination: Secondary | ICD-10-CM | POA: Diagnosis not present

## 2022-04-29 DIAGNOSIS — I63511 Cerebral infarction due to unspecified occlusion or stenosis of right middle cerebral artery: Secondary | ICD-10-CM | POA: Diagnosis not present

## 2022-04-29 DIAGNOSIS — R2681 Unsteadiness on feet: Secondary | ICD-10-CM | POA: Diagnosis not present

## 2022-04-29 DIAGNOSIS — M6281 Muscle weakness (generalized): Secondary | ICD-10-CM | POA: Diagnosis not present

## 2022-04-30 DIAGNOSIS — I69354 Hemiplegia and hemiparesis following cerebral infarction affecting left non-dominant side: Secondary | ICD-10-CM | POA: Diagnosis not present

## 2022-04-30 DIAGNOSIS — H5442A3 Blindness left eye category 3, normal vision right eye: Secondary | ICD-10-CM | POA: Diagnosis not present

## 2022-04-30 DIAGNOSIS — Z9181 History of falling: Secondary | ICD-10-CM | POA: Diagnosis not present

## 2022-04-30 DIAGNOSIS — M545 Low back pain, unspecified: Secondary | ICD-10-CM | POA: Diagnosis not present

## 2022-04-30 DIAGNOSIS — I779 Disorder of arteries and arterioles, unspecified: Secondary | ICD-10-CM | POA: Diagnosis not present

## 2022-04-30 DIAGNOSIS — E7849 Other hyperlipidemia: Secondary | ICD-10-CM | POA: Diagnosis not present

## 2022-04-30 DIAGNOSIS — G4700 Insomnia, unspecified: Secondary | ICD-10-CM | POA: Diagnosis not present

## 2022-04-30 DIAGNOSIS — M792 Neuralgia and neuritis, unspecified: Secondary | ICD-10-CM | POA: Diagnosis not present

## 2022-04-30 DIAGNOSIS — M6249 Contracture of muscle, multiple sites: Secondary | ICD-10-CM | POA: Diagnosis not present

## 2022-04-30 DIAGNOSIS — R2681 Unsteadiness on feet: Secondary | ICD-10-CM | POA: Diagnosis not present

## 2022-04-30 DIAGNOSIS — Z7982 Long term (current) use of aspirin: Secondary | ICD-10-CM | POA: Diagnosis not present

## 2022-04-30 DIAGNOSIS — E785 Hyperlipidemia, unspecified: Secondary | ICD-10-CM | POA: Diagnosis not present

## 2022-04-30 DIAGNOSIS — M6281 Muscle weakness (generalized): Secondary | ICD-10-CM | POA: Diagnosis not present

## 2022-04-30 DIAGNOSIS — R278 Other lack of coordination: Secondary | ICD-10-CM | POA: Diagnosis not present

## 2022-04-30 DIAGNOSIS — R1310 Dysphagia, unspecified: Secondary | ICD-10-CM | POA: Diagnosis not present

## 2022-04-30 DIAGNOSIS — I63311 Cerebral infarction due to thrombosis of right middle cerebral artery: Secondary | ICD-10-CM | POA: Diagnosis not present

## 2022-04-30 DIAGNOSIS — G8194 Hemiplegia, unspecified affecting left nondominant side: Secondary | ICD-10-CM | POA: Diagnosis not present

## 2022-04-30 DIAGNOSIS — I6529 Occlusion and stenosis of unspecified carotid artery: Secondary | ICD-10-CM | POA: Diagnosis not present

## 2022-04-30 DIAGNOSIS — I10 Essential (primary) hypertension: Secondary | ICD-10-CM | POA: Diagnosis not present

## 2022-04-30 DIAGNOSIS — I69391 Dysphagia following cerebral infarction: Secondary | ICD-10-CM | POA: Diagnosis not present

## 2022-04-30 DIAGNOSIS — I119 Hypertensive heart disease without heart failure: Secondary | ICD-10-CM | POA: Diagnosis not present

## 2022-04-30 DIAGNOSIS — I63511 Cerebral infarction due to unspecified occlusion or stenosis of right middle cerebral artery: Secondary | ICD-10-CM | POA: Diagnosis not present

## 2022-05-01 ENCOUNTER — Telehealth: Payer: Self-pay | Admitting: Neurology

## 2022-05-01 DIAGNOSIS — I69354 Hemiplegia and hemiparesis following cerebral infarction affecting left non-dominant side: Secondary | ICD-10-CM | POA: Diagnosis not present

## 2022-05-01 DIAGNOSIS — R278 Other lack of coordination: Secondary | ICD-10-CM | POA: Diagnosis not present

## 2022-05-01 DIAGNOSIS — M6281 Muscle weakness (generalized): Secondary | ICD-10-CM | POA: Diagnosis not present

## 2022-05-01 DIAGNOSIS — I63511 Cerebral infarction due to unspecified occlusion or stenosis of right middle cerebral artery: Secondary | ICD-10-CM | POA: Diagnosis not present

## 2022-05-01 DIAGNOSIS — R2681 Unsteadiness on feet: Secondary | ICD-10-CM | POA: Diagnosis not present

## 2022-05-01 NOTE — Telephone Encounter (Signed)
Unable to reach pt over the phone, sent mychart msg asking pt to call back to reschedule 10/19 appointment - NP out

## 2022-05-02 DIAGNOSIS — M6281 Muscle weakness (generalized): Secondary | ICD-10-CM | POA: Diagnosis not present

## 2022-05-02 DIAGNOSIS — R278 Other lack of coordination: Secondary | ICD-10-CM | POA: Diagnosis not present

## 2022-05-02 DIAGNOSIS — I63511 Cerebral infarction due to unspecified occlusion or stenosis of right middle cerebral artery: Secondary | ICD-10-CM | POA: Diagnosis not present

## 2022-05-02 DIAGNOSIS — I69354 Hemiplegia and hemiparesis following cerebral infarction affecting left non-dominant side: Secondary | ICD-10-CM | POA: Diagnosis not present

## 2022-05-02 DIAGNOSIS — R2681 Unsteadiness on feet: Secondary | ICD-10-CM | POA: Diagnosis not present

## 2022-05-03 DIAGNOSIS — I6529 Occlusion and stenosis of unspecified carotid artery: Secondary | ICD-10-CM | POA: Diagnosis not present

## 2022-05-03 DIAGNOSIS — H5442A3 Blindness left eye category 3, normal vision right eye: Secondary | ICD-10-CM | POA: Diagnosis not present

## 2022-05-03 DIAGNOSIS — M545 Low back pain, unspecified: Secondary | ICD-10-CM | POA: Diagnosis not present

## 2022-05-03 DIAGNOSIS — R2681 Unsteadiness on feet: Secondary | ICD-10-CM | POA: Diagnosis not present

## 2022-05-03 DIAGNOSIS — I63511 Cerebral infarction due to unspecified occlusion or stenosis of right middle cerebral artery: Secondary | ICD-10-CM | POA: Diagnosis not present

## 2022-05-03 DIAGNOSIS — Z9181 History of falling: Secondary | ICD-10-CM | POA: Diagnosis not present

## 2022-05-03 DIAGNOSIS — M6281 Muscle weakness (generalized): Secondary | ICD-10-CM | POA: Diagnosis not present

## 2022-05-03 DIAGNOSIS — R278 Other lack of coordination: Secondary | ICD-10-CM | POA: Diagnosis not present

## 2022-05-03 DIAGNOSIS — I63311 Cerebral infarction due to thrombosis of right middle cerebral artery: Secondary | ICD-10-CM | POA: Diagnosis not present

## 2022-05-03 DIAGNOSIS — G4700 Insomnia, unspecified: Secondary | ICD-10-CM | POA: Diagnosis not present

## 2022-05-03 DIAGNOSIS — I69354 Hemiplegia and hemiparesis following cerebral infarction affecting left non-dominant side: Secondary | ICD-10-CM | POA: Diagnosis not present

## 2022-05-03 DIAGNOSIS — I119 Hypertensive heart disease without heart failure: Secondary | ICD-10-CM | POA: Diagnosis not present

## 2022-05-03 DIAGNOSIS — E7849 Other hyperlipidemia: Secondary | ICD-10-CM | POA: Diagnosis not present

## 2022-05-03 DIAGNOSIS — G8194 Hemiplegia, unspecified affecting left nondominant side: Secondary | ICD-10-CM | POA: Diagnosis not present

## 2022-05-03 DIAGNOSIS — M6249 Contracture of muscle, multiple sites: Secondary | ICD-10-CM | POA: Diagnosis not present

## 2022-05-03 DIAGNOSIS — K5900 Constipation, unspecified: Secondary | ICD-10-CM | POA: Diagnosis not present

## 2022-05-06 DIAGNOSIS — G8194 Hemiplegia, unspecified affecting left nondominant side: Secondary | ICD-10-CM | POA: Diagnosis not present

## 2022-05-06 DIAGNOSIS — I63311 Cerebral infarction due to thrombosis of right middle cerebral artery: Secondary | ICD-10-CM | POA: Diagnosis not present

## 2022-05-06 DIAGNOSIS — I63511 Cerebral infarction due to unspecified occlusion or stenosis of right middle cerebral artery: Secondary | ICD-10-CM | POA: Diagnosis not present

## 2022-05-06 DIAGNOSIS — M545 Low back pain, unspecified: Secondary | ICD-10-CM | POA: Diagnosis not present

## 2022-05-06 DIAGNOSIS — R278 Other lack of coordination: Secondary | ICD-10-CM | POA: Diagnosis not present

## 2022-05-06 DIAGNOSIS — K5900 Constipation, unspecified: Secondary | ICD-10-CM | POA: Diagnosis not present

## 2022-05-06 DIAGNOSIS — E7849 Other hyperlipidemia: Secondary | ICD-10-CM | POA: Diagnosis not present

## 2022-05-06 DIAGNOSIS — R2681 Unsteadiness on feet: Secondary | ICD-10-CM | POA: Diagnosis not present

## 2022-05-06 DIAGNOSIS — M6249 Contracture of muscle, multiple sites: Secondary | ICD-10-CM | POA: Diagnosis not present

## 2022-05-06 DIAGNOSIS — I6529 Occlusion and stenosis of unspecified carotid artery: Secondary | ICD-10-CM | POA: Diagnosis not present

## 2022-05-06 DIAGNOSIS — I69354 Hemiplegia and hemiparesis following cerebral infarction affecting left non-dominant side: Secondary | ICD-10-CM | POA: Diagnosis not present

## 2022-05-06 DIAGNOSIS — G4700 Insomnia, unspecified: Secondary | ICD-10-CM | POA: Diagnosis not present

## 2022-05-06 DIAGNOSIS — H5442A3 Blindness left eye category 3, normal vision right eye: Secondary | ICD-10-CM | POA: Diagnosis not present

## 2022-05-06 DIAGNOSIS — Z9181 History of falling: Secondary | ICD-10-CM | POA: Diagnosis not present

## 2022-05-06 DIAGNOSIS — M6281 Muscle weakness (generalized): Secondary | ICD-10-CM | POA: Diagnosis not present

## 2022-05-06 DIAGNOSIS — I119 Hypertensive heart disease without heart failure: Secondary | ICD-10-CM | POA: Diagnosis not present

## 2022-05-07 DIAGNOSIS — M6281 Muscle weakness (generalized): Secondary | ICD-10-CM | POA: Diagnosis not present

## 2022-05-07 DIAGNOSIS — R278 Other lack of coordination: Secondary | ICD-10-CM | POA: Diagnosis not present

## 2022-05-07 DIAGNOSIS — R2681 Unsteadiness on feet: Secondary | ICD-10-CM | POA: Diagnosis not present

## 2022-05-07 DIAGNOSIS — I69354 Hemiplegia and hemiparesis following cerebral infarction affecting left non-dominant side: Secondary | ICD-10-CM | POA: Diagnosis not present

## 2022-05-07 DIAGNOSIS — I63511 Cerebral infarction due to unspecified occlusion or stenosis of right middle cerebral artery: Secondary | ICD-10-CM | POA: Diagnosis not present

## 2022-05-08 DIAGNOSIS — I63511 Cerebral infarction due to unspecified occlusion or stenosis of right middle cerebral artery: Secondary | ICD-10-CM | POA: Diagnosis not present

## 2022-05-08 DIAGNOSIS — I69354 Hemiplegia and hemiparesis following cerebral infarction affecting left non-dominant side: Secondary | ICD-10-CM | POA: Diagnosis not present

## 2022-05-08 DIAGNOSIS — R278 Other lack of coordination: Secondary | ICD-10-CM | POA: Diagnosis not present

## 2022-05-08 DIAGNOSIS — M6281 Muscle weakness (generalized): Secondary | ICD-10-CM | POA: Diagnosis not present

## 2022-05-08 DIAGNOSIS — R2681 Unsteadiness on feet: Secondary | ICD-10-CM | POA: Diagnosis not present

## 2022-05-09 DIAGNOSIS — R278 Other lack of coordination: Secondary | ICD-10-CM | POA: Diagnosis not present

## 2022-05-09 DIAGNOSIS — M6281 Muscle weakness (generalized): Secondary | ICD-10-CM | POA: Diagnosis not present

## 2022-05-09 DIAGNOSIS — I69354 Hemiplegia and hemiparesis following cerebral infarction affecting left non-dominant side: Secondary | ICD-10-CM | POA: Diagnosis not present

## 2022-05-09 DIAGNOSIS — I63511 Cerebral infarction due to unspecified occlusion or stenosis of right middle cerebral artery: Secondary | ICD-10-CM | POA: Diagnosis not present

## 2022-05-09 DIAGNOSIS — R2681 Unsteadiness on feet: Secondary | ICD-10-CM | POA: Diagnosis not present

## 2022-05-10 DIAGNOSIS — I69354 Hemiplegia and hemiparesis following cerebral infarction affecting left non-dominant side: Secondary | ICD-10-CM | POA: Diagnosis not present

## 2022-05-10 DIAGNOSIS — I63511 Cerebral infarction due to unspecified occlusion or stenosis of right middle cerebral artery: Secondary | ICD-10-CM | POA: Diagnosis not present

## 2022-05-10 DIAGNOSIS — M6281 Muscle weakness (generalized): Secondary | ICD-10-CM | POA: Diagnosis not present

## 2022-05-10 DIAGNOSIS — R2681 Unsteadiness on feet: Secondary | ICD-10-CM | POA: Diagnosis not present

## 2022-05-10 DIAGNOSIS — R278 Other lack of coordination: Secondary | ICD-10-CM | POA: Diagnosis not present

## 2022-05-13 DIAGNOSIS — R2681 Unsteadiness on feet: Secondary | ICD-10-CM | POA: Diagnosis not present

## 2022-05-13 DIAGNOSIS — I63511 Cerebral infarction due to unspecified occlusion or stenosis of right middle cerebral artery: Secondary | ICD-10-CM | POA: Diagnosis not present

## 2022-05-13 DIAGNOSIS — M6281 Muscle weakness (generalized): Secondary | ICD-10-CM | POA: Diagnosis not present

## 2022-05-13 DIAGNOSIS — I69354 Hemiplegia and hemiparesis following cerebral infarction affecting left non-dominant side: Secondary | ICD-10-CM | POA: Diagnosis not present

## 2022-05-13 DIAGNOSIS — R278 Other lack of coordination: Secondary | ICD-10-CM | POA: Diagnosis not present

## 2022-05-14 DIAGNOSIS — R2681 Unsteadiness on feet: Secondary | ICD-10-CM | POA: Diagnosis not present

## 2022-05-14 DIAGNOSIS — I69354 Hemiplegia and hemiparesis following cerebral infarction affecting left non-dominant side: Secondary | ICD-10-CM | POA: Diagnosis not present

## 2022-05-14 DIAGNOSIS — R278 Other lack of coordination: Secondary | ICD-10-CM | POA: Diagnosis not present

## 2022-05-14 DIAGNOSIS — M6281 Muscle weakness (generalized): Secondary | ICD-10-CM | POA: Diagnosis not present

## 2022-05-14 DIAGNOSIS — I63511 Cerebral infarction due to unspecified occlusion or stenosis of right middle cerebral artery: Secondary | ICD-10-CM | POA: Diagnosis not present

## 2022-05-15 DIAGNOSIS — R2681 Unsteadiness on feet: Secondary | ICD-10-CM | POA: Diagnosis not present

## 2022-05-15 DIAGNOSIS — I63511 Cerebral infarction due to unspecified occlusion or stenosis of right middle cerebral artery: Secondary | ICD-10-CM | POA: Diagnosis not present

## 2022-05-15 DIAGNOSIS — R278 Other lack of coordination: Secondary | ICD-10-CM | POA: Diagnosis not present

## 2022-05-15 DIAGNOSIS — I69354 Hemiplegia and hemiparesis following cerebral infarction affecting left non-dominant side: Secondary | ICD-10-CM | POA: Diagnosis not present

## 2022-05-15 DIAGNOSIS — M6281 Muscle weakness (generalized): Secondary | ICD-10-CM | POA: Diagnosis not present

## 2022-05-16 ENCOUNTER — Encounter: Payer: Medicare Other | Admitting: Physical Medicine & Rehabilitation

## 2022-05-16 ENCOUNTER — Ambulatory Visit: Payer: Medicare Other | Admitting: Neurology

## 2022-05-16 DIAGNOSIS — K5900 Constipation, unspecified: Secondary | ICD-10-CM | POA: Diagnosis not present

## 2022-05-16 DIAGNOSIS — R278 Other lack of coordination: Secondary | ICD-10-CM | POA: Diagnosis not present

## 2022-05-16 DIAGNOSIS — Z9181 History of falling: Secondary | ICD-10-CM | POA: Diagnosis not present

## 2022-05-16 DIAGNOSIS — I119 Hypertensive heart disease without heart failure: Secondary | ICD-10-CM | POA: Diagnosis not present

## 2022-05-16 DIAGNOSIS — G8194 Hemiplegia, unspecified affecting left nondominant side: Secondary | ICD-10-CM | POA: Diagnosis not present

## 2022-05-16 DIAGNOSIS — G4700 Insomnia, unspecified: Secondary | ICD-10-CM | POA: Diagnosis not present

## 2022-05-16 DIAGNOSIS — M6281 Muscle weakness (generalized): Secondary | ICD-10-CM | POA: Diagnosis not present

## 2022-05-16 DIAGNOSIS — M545 Low back pain, unspecified: Secondary | ICD-10-CM | POA: Diagnosis not present

## 2022-05-16 DIAGNOSIS — H5442A3 Blindness left eye category 3, normal vision right eye: Secondary | ICD-10-CM | POA: Diagnosis not present

## 2022-05-16 DIAGNOSIS — I63511 Cerebral infarction due to unspecified occlusion or stenosis of right middle cerebral artery: Secondary | ICD-10-CM | POA: Diagnosis not present

## 2022-05-16 DIAGNOSIS — I6529 Occlusion and stenosis of unspecified carotid artery: Secondary | ICD-10-CM | POA: Diagnosis not present

## 2022-05-16 DIAGNOSIS — I63311 Cerebral infarction due to thrombosis of right middle cerebral artery: Secondary | ICD-10-CM | POA: Diagnosis not present

## 2022-05-16 DIAGNOSIS — R2681 Unsteadiness on feet: Secondary | ICD-10-CM | POA: Diagnosis not present

## 2022-05-16 DIAGNOSIS — I69354 Hemiplegia and hemiparesis following cerebral infarction affecting left non-dominant side: Secondary | ICD-10-CM | POA: Diagnosis not present

## 2022-05-16 DIAGNOSIS — M6249 Contracture of muscle, multiple sites: Secondary | ICD-10-CM | POA: Diagnosis not present

## 2022-05-16 DIAGNOSIS — E7849 Other hyperlipidemia: Secondary | ICD-10-CM | POA: Diagnosis not present

## 2022-05-17 DIAGNOSIS — M6281 Muscle weakness (generalized): Secondary | ICD-10-CM | POA: Diagnosis not present

## 2022-05-17 DIAGNOSIS — R278 Other lack of coordination: Secondary | ICD-10-CM | POA: Diagnosis not present

## 2022-05-17 DIAGNOSIS — R2681 Unsteadiness on feet: Secondary | ICD-10-CM | POA: Diagnosis not present

## 2022-05-17 DIAGNOSIS — I69354 Hemiplegia and hemiparesis following cerebral infarction affecting left non-dominant side: Secondary | ICD-10-CM | POA: Diagnosis not present

## 2022-05-17 DIAGNOSIS — I63511 Cerebral infarction due to unspecified occlusion or stenosis of right middle cerebral artery: Secondary | ICD-10-CM | POA: Diagnosis not present

## 2022-05-19 DIAGNOSIS — R2681 Unsteadiness on feet: Secondary | ICD-10-CM | POA: Diagnosis not present

## 2022-05-19 DIAGNOSIS — R278 Other lack of coordination: Secondary | ICD-10-CM | POA: Diagnosis not present

## 2022-05-19 DIAGNOSIS — I63511 Cerebral infarction due to unspecified occlusion or stenosis of right middle cerebral artery: Secondary | ICD-10-CM | POA: Diagnosis not present

## 2022-05-19 DIAGNOSIS — I69354 Hemiplegia and hemiparesis following cerebral infarction affecting left non-dominant side: Secondary | ICD-10-CM | POA: Diagnosis not present

## 2022-05-19 DIAGNOSIS — M6281 Muscle weakness (generalized): Secondary | ICD-10-CM | POA: Diagnosis not present

## 2022-05-20 DIAGNOSIS — I63311 Cerebral infarction due to thrombosis of right middle cerebral artery: Secondary | ICD-10-CM | POA: Diagnosis not present

## 2022-05-20 DIAGNOSIS — M545 Low back pain, unspecified: Secondary | ICD-10-CM | POA: Diagnosis not present

## 2022-05-20 DIAGNOSIS — E7849 Other hyperlipidemia: Secondary | ICD-10-CM | POA: Diagnosis not present

## 2022-05-20 DIAGNOSIS — Z9181 History of falling: Secondary | ICD-10-CM | POA: Diagnosis not present

## 2022-05-20 DIAGNOSIS — M6249 Contracture of muscle, multiple sites: Secondary | ICD-10-CM | POA: Diagnosis not present

## 2022-05-20 DIAGNOSIS — I119 Hypertensive heart disease without heart failure: Secondary | ICD-10-CM | POA: Diagnosis not present

## 2022-05-20 DIAGNOSIS — R2681 Unsteadiness on feet: Secondary | ICD-10-CM | POA: Diagnosis not present

## 2022-05-20 DIAGNOSIS — I69354 Hemiplegia and hemiparesis following cerebral infarction affecting left non-dominant side: Secondary | ICD-10-CM | POA: Diagnosis not present

## 2022-05-20 DIAGNOSIS — K5900 Constipation, unspecified: Secondary | ICD-10-CM | POA: Diagnosis not present

## 2022-05-20 DIAGNOSIS — I6529 Occlusion and stenosis of unspecified carotid artery: Secondary | ICD-10-CM | POA: Diagnosis not present

## 2022-05-20 DIAGNOSIS — R278 Other lack of coordination: Secondary | ICD-10-CM | POA: Diagnosis not present

## 2022-05-20 DIAGNOSIS — H5442A3 Blindness left eye category 3, normal vision right eye: Secondary | ICD-10-CM | POA: Diagnosis not present

## 2022-05-20 DIAGNOSIS — I63511 Cerebral infarction due to unspecified occlusion or stenosis of right middle cerebral artery: Secondary | ICD-10-CM | POA: Diagnosis not present

## 2022-05-20 DIAGNOSIS — G8194 Hemiplegia, unspecified affecting left nondominant side: Secondary | ICD-10-CM | POA: Diagnosis not present

## 2022-05-20 DIAGNOSIS — G4700 Insomnia, unspecified: Secondary | ICD-10-CM | POA: Diagnosis not present

## 2022-05-20 DIAGNOSIS — M6281 Muscle weakness (generalized): Secondary | ICD-10-CM | POA: Diagnosis not present

## 2022-05-21 DIAGNOSIS — I63511 Cerebral infarction due to unspecified occlusion or stenosis of right middle cerebral artery: Secondary | ICD-10-CM | POA: Diagnosis not present

## 2022-05-21 DIAGNOSIS — R278 Other lack of coordination: Secondary | ICD-10-CM | POA: Diagnosis not present

## 2022-05-21 DIAGNOSIS — I69354 Hemiplegia and hemiparesis following cerebral infarction affecting left non-dominant side: Secondary | ICD-10-CM | POA: Diagnosis not present

## 2022-05-21 DIAGNOSIS — Z89412 Acquired absence of left great toe: Secondary | ICD-10-CM | POA: Diagnosis not present

## 2022-05-21 DIAGNOSIS — I739 Peripheral vascular disease, unspecified: Secondary | ICD-10-CM | POA: Diagnosis not present

## 2022-05-21 DIAGNOSIS — B351 Tinea unguium: Secondary | ICD-10-CM | POA: Diagnosis not present

## 2022-05-21 DIAGNOSIS — M6281 Muscle weakness (generalized): Secondary | ICD-10-CM | POA: Diagnosis not present

## 2022-05-21 DIAGNOSIS — R2681 Unsteadiness on feet: Secondary | ICD-10-CM | POA: Diagnosis not present

## 2022-05-22 DIAGNOSIS — R278 Other lack of coordination: Secondary | ICD-10-CM | POA: Diagnosis not present

## 2022-05-22 DIAGNOSIS — R2681 Unsteadiness on feet: Secondary | ICD-10-CM | POA: Diagnosis not present

## 2022-05-22 DIAGNOSIS — I69354 Hemiplegia and hemiparesis following cerebral infarction affecting left non-dominant side: Secondary | ICD-10-CM | POA: Diagnosis not present

## 2022-05-22 DIAGNOSIS — I63511 Cerebral infarction due to unspecified occlusion or stenosis of right middle cerebral artery: Secondary | ICD-10-CM | POA: Diagnosis not present

## 2022-05-22 DIAGNOSIS — M6281 Muscle weakness (generalized): Secondary | ICD-10-CM | POA: Diagnosis not present

## 2022-05-23 DIAGNOSIS — R278 Other lack of coordination: Secondary | ICD-10-CM | POA: Diagnosis not present

## 2022-05-23 DIAGNOSIS — I63511 Cerebral infarction due to unspecified occlusion or stenosis of right middle cerebral artery: Secondary | ICD-10-CM | POA: Diagnosis not present

## 2022-05-23 DIAGNOSIS — M6281 Muscle weakness (generalized): Secondary | ICD-10-CM | POA: Diagnosis not present

## 2022-05-23 DIAGNOSIS — I69354 Hemiplegia and hemiparesis following cerebral infarction affecting left non-dominant side: Secondary | ICD-10-CM | POA: Diagnosis not present

## 2022-05-23 DIAGNOSIS — R2681 Unsteadiness on feet: Secondary | ICD-10-CM | POA: Diagnosis not present

## 2022-05-24 DIAGNOSIS — I63511 Cerebral infarction due to unspecified occlusion or stenosis of right middle cerebral artery: Secondary | ICD-10-CM | POA: Diagnosis not present

## 2022-05-24 DIAGNOSIS — I69354 Hemiplegia and hemiparesis following cerebral infarction affecting left non-dominant side: Secondary | ICD-10-CM | POA: Diagnosis not present

## 2022-05-24 DIAGNOSIS — M6281 Muscle weakness (generalized): Secondary | ICD-10-CM | POA: Diagnosis not present

## 2022-05-24 DIAGNOSIS — R278 Other lack of coordination: Secondary | ICD-10-CM | POA: Diagnosis not present

## 2022-05-24 DIAGNOSIS — R2681 Unsteadiness on feet: Secondary | ICD-10-CM | POA: Diagnosis not present

## 2022-05-27 DIAGNOSIS — I63511 Cerebral infarction due to unspecified occlusion or stenosis of right middle cerebral artery: Secondary | ICD-10-CM | POA: Diagnosis not present

## 2022-05-27 DIAGNOSIS — I69354 Hemiplegia and hemiparesis following cerebral infarction affecting left non-dominant side: Secondary | ICD-10-CM | POA: Diagnosis not present

## 2022-05-27 DIAGNOSIS — R278 Other lack of coordination: Secondary | ICD-10-CM | POA: Diagnosis not present

## 2022-05-27 DIAGNOSIS — R2681 Unsteadiness on feet: Secondary | ICD-10-CM | POA: Diagnosis not present

## 2022-05-27 DIAGNOSIS — M6281 Muscle weakness (generalized): Secondary | ICD-10-CM | POA: Diagnosis not present

## 2022-05-28 DIAGNOSIS — I63511 Cerebral infarction due to unspecified occlusion or stenosis of right middle cerebral artery: Secondary | ICD-10-CM | POA: Diagnosis not present

## 2022-05-28 DIAGNOSIS — I69354 Hemiplegia and hemiparesis following cerebral infarction affecting left non-dominant side: Secondary | ICD-10-CM | POA: Diagnosis not present

## 2022-05-28 DIAGNOSIS — R2681 Unsteadiness on feet: Secondary | ICD-10-CM | POA: Diagnosis not present

## 2022-05-28 DIAGNOSIS — M6281 Muscle weakness (generalized): Secondary | ICD-10-CM | POA: Diagnosis not present

## 2022-05-28 DIAGNOSIS — R278 Other lack of coordination: Secondary | ICD-10-CM | POA: Diagnosis not present

## 2022-05-29 DIAGNOSIS — K5900 Constipation, unspecified: Secondary | ICD-10-CM | POA: Diagnosis not present

## 2022-05-29 DIAGNOSIS — M6281 Muscle weakness (generalized): Secondary | ICD-10-CM | POA: Diagnosis not present

## 2022-05-29 DIAGNOSIS — M6249 Contracture of muscle, multiple sites: Secondary | ICD-10-CM | POA: Diagnosis not present

## 2022-05-29 DIAGNOSIS — R2681 Unsteadiness on feet: Secondary | ICD-10-CM | POA: Diagnosis not present

## 2022-05-29 DIAGNOSIS — I63311 Cerebral infarction due to thrombosis of right middle cerebral artery: Secondary | ICD-10-CM | POA: Diagnosis not present

## 2022-05-29 DIAGNOSIS — G8194 Hemiplegia, unspecified affecting left nondominant side: Secondary | ICD-10-CM | POA: Diagnosis not present

## 2022-05-29 DIAGNOSIS — R278 Other lack of coordination: Secondary | ICD-10-CM | POA: Diagnosis not present

## 2022-05-29 DIAGNOSIS — I119 Hypertensive heart disease without heart failure: Secondary | ICD-10-CM | POA: Diagnosis not present

## 2022-05-29 DIAGNOSIS — G4700 Insomnia, unspecified: Secondary | ICD-10-CM | POA: Diagnosis not present

## 2022-05-29 DIAGNOSIS — H5442A3 Blindness left eye category 3, normal vision right eye: Secondary | ICD-10-CM | POA: Diagnosis not present

## 2022-05-29 DIAGNOSIS — I63511 Cerebral infarction due to unspecified occlusion or stenosis of right middle cerebral artery: Secondary | ICD-10-CM | POA: Diagnosis not present

## 2022-05-29 DIAGNOSIS — Z9181 History of falling: Secondary | ICD-10-CM | POA: Diagnosis not present

## 2022-05-29 DIAGNOSIS — I69354 Hemiplegia and hemiparesis following cerebral infarction affecting left non-dominant side: Secondary | ICD-10-CM | POA: Diagnosis not present

## 2022-05-29 DIAGNOSIS — I6529 Occlusion and stenosis of unspecified carotid artery: Secondary | ICD-10-CM | POA: Diagnosis not present

## 2022-05-29 DIAGNOSIS — M545 Low back pain, unspecified: Secondary | ICD-10-CM | POA: Diagnosis not present

## 2022-05-30 DIAGNOSIS — R278 Other lack of coordination: Secondary | ICD-10-CM | POA: Diagnosis not present

## 2022-05-30 DIAGNOSIS — R2681 Unsteadiness on feet: Secondary | ICD-10-CM | POA: Diagnosis not present

## 2022-05-30 DIAGNOSIS — I69354 Hemiplegia and hemiparesis following cerebral infarction affecting left non-dominant side: Secondary | ICD-10-CM | POA: Diagnosis not present

## 2022-05-30 DIAGNOSIS — M6281 Muscle weakness (generalized): Secondary | ICD-10-CM | POA: Diagnosis not present

## 2022-05-30 DIAGNOSIS — I63511 Cerebral infarction due to unspecified occlusion or stenosis of right middle cerebral artery: Secondary | ICD-10-CM | POA: Diagnosis not present

## 2022-05-31 DIAGNOSIS — I69354 Hemiplegia and hemiparesis following cerebral infarction affecting left non-dominant side: Secondary | ICD-10-CM | POA: Diagnosis not present

## 2022-05-31 DIAGNOSIS — I63511 Cerebral infarction due to unspecified occlusion or stenosis of right middle cerebral artery: Secondary | ICD-10-CM | POA: Diagnosis not present

## 2022-05-31 DIAGNOSIS — R2681 Unsteadiness on feet: Secondary | ICD-10-CM | POA: Diagnosis not present

## 2022-05-31 DIAGNOSIS — M6281 Muscle weakness (generalized): Secondary | ICD-10-CM | POA: Diagnosis not present

## 2022-05-31 DIAGNOSIS — R278 Other lack of coordination: Secondary | ICD-10-CM | POA: Diagnosis not present

## 2022-06-03 DIAGNOSIS — R278 Other lack of coordination: Secondary | ICD-10-CM | POA: Diagnosis not present

## 2022-06-03 DIAGNOSIS — I69354 Hemiplegia and hemiparesis following cerebral infarction affecting left non-dominant side: Secondary | ICD-10-CM | POA: Diagnosis not present

## 2022-06-03 DIAGNOSIS — R2681 Unsteadiness on feet: Secondary | ICD-10-CM | POA: Diagnosis not present

## 2022-06-03 DIAGNOSIS — I63511 Cerebral infarction due to unspecified occlusion or stenosis of right middle cerebral artery: Secondary | ICD-10-CM | POA: Diagnosis not present

## 2022-06-03 DIAGNOSIS — M6281 Muscle weakness (generalized): Secondary | ICD-10-CM | POA: Diagnosis not present

## 2022-06-04 DIAGNOSIS — M6281 Muscle weakness (generalized): Secondary | ICD-10-CM | POA: Diagnosis not present

## 2022-06-04 DIAGNOSIS — R278 Other lack of coordination: Secondary | ICD-10-CM | POA: Diagnosis not present

## 2022-06-04 DIAGNOSIS — I69354 Hemiplegia and hemiparesis following cerebral infarction affecting left non-dominant side: Secondary | ICD-10-CM | POA: Diagnosis not present

## 2022-06-04 DIAGNOSIS — I63511 Cerebral infarction due to unspecified occlusion or stenosis of right middle cerebral artery: Secondary | ICD-10-CM | POA: Diagnosis not present

## 2022-06-04 DIAGNOSIS — R2681 Unsteadiness on feet: Secondary | ICD-10-CM | POA: Diagnosis not present

## 2022-06-05 DIAGNOSIS — R2681 Unsteadiness on feet: Secondary | ICD-10-CM | POA: Diagnosis not present

## 2022-06-05 DIAGNOSIS — I69354 Hemiplegia and hemiparesis following cerebral infarction affecting left non-dominant side: Secondary | ICD-10-CM | POA: Diagnosis not present

## 2022-06-05 DIAGNOSIS — R278 Other lack of coordination: Secondary | ICD-10-CM | POA: Diagnosis not present

## 2022-06-05 DIAGNOSIS — M6281 Muscle weakness (generalized): Secondary | ICD-10-CM | POA: Diagnosis not present

## 2022-06-05 DIAGNOSIS — I63511 Cerebral infarction due to unspecified occlusion or stenosis of right middle cerebral artery: Secondary | ICD-10-CM | POA: Diagnosis not present

## 2022-06-06 DIAGNOSIS — I63511 Cerebral infarction due to unspecified occlusion or stenosis of right middle cerebral artery: Secondary | ICD-10-CM | POA: Diagnosis not present

## 2022-06-06 DIAGNOSIS — M6281 Muscle weakness (generalized): Secondary | ICD-10-CM | POA: Diagnosis not present

## 2022-06-06 DIAGNOSIS — R2681 Unsteadiness on feet: Secondary | ICD-10-CM | POA: Diagnosis not present

## 2022-06-06 DIAGNOSIS — I69354 Hemiplegia and hemiparesis following cerebral infarction affecting left non-dominant side: Secondary | ICD-10-CM | POA: Diagnosis not present

## 2022-06-06 DIAGNOSIS — R278 Other lack of coordination: Secondary | ICD-10-CM | POA: Diagnosis not present

## 2022-06-07 DIAGNOSIS — I69354 Hemiplegia and hemiparesis following cerebral infarction affecting left non-dominant side: Secondary | ICD-10-CM | POA: Diagnosis not present

## 2022-06-07 DIAGNOSIS — I63511 Cerebral infarction due to unspecified occlusion or stenosis of right middle cerebral artery: Secondary | ICD-10-CM | POA: Diagnosis not present

## 2022-06-07 DIAGNOSIS — R2681 Unsteadiness on feet: Secondary | ICD-10-CM | POA: Diagnosis not present

## 2022-06-07 DIAGNOSIS — M6281 Muscle weakness (generalized): Secondary | ICD-10-CM | POA: Diagnosis not present

## 2022-06-07 DIAGNOSIS — R278 Other lack of coordination: Secondary | ICD-10-CM | POA: Diagnosis not present

## 2022-06-10 DIAGNOSIS — R278 Other lack of coordination: Secondary | ICD-10-CM | POA: Diagnosis not present

## 2022-06-10 DIAGNOSIS — M6281 Muscle weakness (generalized): Secondary | ICD-10-CM | POA: Diagnosis not present

## 2022-06-10 DIAGNOSIS — I69354 Hemiplegia and hemiparesis following cerebral infarction affecting left non-dominant side: Secondary | ICD-10-CM | POA: Diagnosis not present

## 2022-06-10 DIAGNOSIS — I63511 Cerebral infarction due to unspecified occlusion or stenosis of right middle cerebral artery: Secondary | ICD-10-CM | POA: Diagnosis not present

## 2022-06-10 DIAGNOSIS — R2681 Unsteadiness on feet: Secondary | ICD-10-CM | POA: Diagnosis not present

## 2022-06-11 DIAGNOSIS — G4700 Insomnia, unspecified: Secondary | ICD-10-CM | POA: Diagnosis not present

## 2022-06-11 DIAGNOSIS — I63311 Cerebral infarction due to thrombosis of right middle cerebral artery: Secondary | ICD-10-CM | POA: Diagnosis not present

## 2022-06-11 DIAGNOSIS — G8194 Hemiplegia, unspecified affecting left nondominant side: Secondary | ICD-10-CM | POA: Diagnosis not present

## 2022-06-11 DIAGNOSIS — R031 Nonspecific low blood-pressure reading: Secondary | ICD-10-CM | POA: Diagnosis not present

## 2022-06-11 DIAGNOSIS — M6281 Muscle weakness (generalized): Secondary | ICD-10-CM | POA: Diagnosis not present

## 2022-06-11 DIAGNOSIS — M6249 Contracture of muscle, multiple sites: Secondary | ICD-10-CM | POA: Diagnosis not present

## 2022-06-11 DIAGNOSIS — R278 Other lack of coordination: Secondary | ICD-10-CM | POA: Diagnosis not present

## 2022-06-11 DIAGNOSIS — I6529 Occlusion and stenosis of unspecified carotid artery: Secondary | ICD-10-CM | POA: Diagnosis not present

## 2022-06-11 DIAGNOSIS — I119 Hypertensive heart disease without heart failure: Secondary | ICD-10-CM | POA: Diagnosis not present

## 2022-06-11 DIAGNOSIS — H5442A3 Blindness left eye category 3, normal vision right eye: Secondary | ICD-10-CM | POA: Diagnosis not present

## 2022-06-11 DIAGNOSIS — K5900 Constipation, unspecified: Secondary | ICD-10-CM | POA: Diagnosis not present

## 2022-06-11 DIAGNOSIS — I69322 Dysarthria following cerebral infarction: Secondary | ICD-10-CM | POA: Diagnosis not present

## 2022-06-11 DIAGNOSIS — I10 Essential (primary) hypertension: Secondary | ICD-10-CM | POA: Diagnosis not present

## 2022-06-11 DIAGNOSIS — I63511 Cerebral infarction due to unspecified occlusion or stenosis of right middle cerebral artery: Secondary | ICD-10-CM | POA: Diagnosis not present

## 2022-06-11 DIAGNOSIS — M545 Low back pain, unspecified: Secondary | ICD-10-CM | POA: Diagnosis not present

## 2022-06-11 DIAGNOSIS — I69354 Hemiplegia and hemiparesis following cerebral infarction affecting left non-dominant side: Secondary | ICD-10-CM | POA: Diagnosis not present

## 2022-06-11 DIAGNOSIS — R2681 Unsteadiness on feet: Secondary | ICD-10-CM | POA: Diagnosis not present

## 2022-06-11 DIAGNOSIS — Z9181 History of falling: Secondary | ICD-10-CM | POA: Diagnosis not present

## 2022-06-12 DIAGNOSIS — I69354 Hemiplegia and hemiparesis following cerebral infarction affecting left non-dominant side: Secondary | ICD-10-CM | POA: Diagnosis not present

## 2022-06-12 DIAGNOSIS — M6281 Muscle weakness (generalized): Secondary | ICD-10-CM | POA: Diagnosis not present

## 2022-06-12 DIAGNOSIS — I63511 Cerebral infarction due to unspecified occlusion or stenosis of right middle cerebral artery: Secondary | ICD-10-CM | POA: Diagnosis not present

## 2022-06-12 DIAGNOSIS — R278 Other lack of coordination: Secondary | ICD-10-CM | POA: Diagnosis not present

## 2022-06-12 DIAGNOSIS — R2681 Unsteadiness on feet: Secondary | ICD-10-CM | POA: Diagnosis not present

## 2022-06-13 DIAGNOSIS — I69354 Hemiplegia and hemiparesis following cerebral infarction affecting left non-dominant side: Secondary | ICD-10-CM | POA: Diagnosis not present

## 2022-06-13 DIAGNOSIS — R278 Other lack of coordination: Secondary | ICD-10-CM | POA: Diagnosis not present

## 2022-06-13 DIAGNOSIS — R2681 Unsteadiness on feet: Secondary | ICD-10-CM | POA: Diagnosis not present

## 2022-06-13 DIAGNOSIS — I63511 Cerebral infarction due to unspecified occlusion or stenosis of right middle cerebral artery: Secondary | ICD-10-CM | POA: Diagnosis not present

## 2022-06-13 DIAGNOSIS — M6281 Muscle weakness (generalized): Secondary | ICD-10-CM | POA: Diagnosis not present

## 2022-06-15 DIAGNOSIS — R278 Other lack of coordination: Secondary | ICD-10-CM | POA: Diagnosis not present

## 2022-06-15 DIAGNOSIS — I63511 Cerebral infarction due to unspecified occlusion or stenosis of right middle cerebral artery: Secondary | ICD-10-CM | POA: Diagnosis not present

## 2022-06-15 DIAGNOSIS — R2681 Unsteadiness on feet: Secondary | ICD-10-CM | POA: Diagnosis not present

## 2022-06-15 DIAGNOSIS — M6281 Muscle weakness (generalized): Secondary | ICD-10-CM | POA: Diagnosis not present

## 2022-06-15 DIAGNOSIS — I69354 Hemiplegia and hemiparesis following cerebral infarction affecting left non-dominant side: Secondary | ICD-10-CM | POA: Diagnosis not present

## 2022-06-17 DIAGNOSIS — M6281 Muscle weakness (generalized): Secondary | ICD-10-CM | POA: Diagnosis not present

## 2022-06-17 DIAGNOSIS — I63511 Cerebral infarction due to unspecified occlusion or stenosis of right middle cerebral artery: Secondary | ICD-10-CM | POA: Diagnosis not present

## 2022-06-17 DIAGNOSIS — I69354 Hemiplegia and hemiparesis following cerebral infarction affecting left non-dominant side: Secondary | ICD-10-CM | POA: Diagnosis not present

## 2022-06-17 DIAGNOSIS — R278 Other lack of coordination: Secondary | ICD-10-CM | POA: Diagnosis not present

## 2022-06-17 DIAGNOSIS — R2681 Unsteadiness on feet: Secondary | ICD-10-CM | POA: Diagnosis not present

## 2022-06-18 DIAGNOSIS — M6281 Muscle weakness (generalized): Secondary | ICD-10-CM | POA: Diagnosis not present

## 2022-06-18 DIAGNOSIS — I69354 Hemiplegia and hemiparesis following cerebral infarction affecting left non-dominant side: Secondary | ICD-10-CM | POA: Diagnosis not present

## 2022-06-18 DIAGNOSIS — R2681 Unsteadiness on feet: Secondary | ICD-10-CM | POA: Diagnosis not present

## 2022-06-18 DIAGNOSIS — I63511 Cerebral infarction due to unspecified occlusion or stenosis of right middle cerebral artery: Secondary | ICD-10-CM | POA: Diagnosis not present

## 2022-06-18 DIAGNOSIS — R278 Other lack of coordination: Secondary | ICD-10-CM | POA: Diagnosis not present

## 2022-06-19 DIAGNOSIS — I63311 Cerebral infarction due to thrombosis of right middle cerebral artery: Secondary | ICD-10-CM | POA: Diagnosis not present

## 2022-06-19 DIAGNOSIS — M545 Low back pain, unspecified: Secondary | ICD-10-CM | POA: Diagnosis not present

## 2022-06-19 DIAGNOSIS — I63511 Cerebral infarction due to unspecified occlusion or stenosis of right middle cerebral artery: Secondary | ICD-10-CM | POA: Diagnosis not present

## 2022-06-19 DIAGNOSIS — M6281 Muscle weakness (generalized): Secondary | ICD-10-CM | POA: Diagnosis not present

## 2022-06-19 DIAGNOSIS — M6249 Contracture of muscle, multiple sites: Secondary | ICD-10-CM | POA: Diagnosis not present

## 2022-06-19 DIAGNOSIS — G4700 Insomnia, unspecified: Secondary | ICD-10-CM | POA: Diagnosis not present

## 2022-06-19 DIAGNOSIS — Z9181 History of falling: Secondary | ICD-10-CM | POA: Diagnosis not present

## 2022-06-19 DIAGNOSIS — H5442A3 Blindness left eye category 3, normal vision right eye: Secondary | ICD-10-CM | POA: Diagnosis not present

## 2022-06-19 DIAGNOSIS — I6529 Occlusion and stenosis of unspecified carotid artery: Secondary | ICD-10-CM | POA: Diagnosis not present

## 2022-06-19 DIAGNOSIS — R278 Other lack of coordination: Secondary | ICD-10-CM | POA: Diagnosis not present

## 2022-06-19 DIAGNOSIS — G8194 Hemiplegia, unspecified affecting left nondominant side: Secondary | ICD-10-CM | POA: Diagnosis not present

## 2022-06-19 DIAGNOSIS — K5900 Constipation, unspecified: Secondary | ICD-10-CM | POA: Diagnosis not present

## 2022-06-19 DIAGNOSIS — R2681 Unsteadiness on feet: Secondary | ICD-10-CM | POA: Diagnosis not present

## 2022-06-19 DIAGNOSIS — I69354 Hemiplegia and hemiparesis following cerebral infarction affecting left non-dominant side: Secondary | ICD-10-CM | POA: Diagnosis not present

## 2022-06-19 DIAGNOSIS — I119 Hypertensive heart disease without heart failure: Secondary | ICD-10-CM | POA: Diagnosis not present

## 2022-06-21 DIAGNOSIS — I69354 Hemiplegia and hemiparesis following cerebral infarction affecting left non-dominant side: Secondary | ICD-10-CM | POA: Diagnosis not present

## 2022-06-21 DIAGNOSIS — R278 Other lack of coordination: Secondary | ICD-10-CM | POA: Diagnosis not present

## 2022-06-21 DIAGNOSIS — M6281 Muscle weakness (generalized): Secondary | ICD-10-CM | POA: Diagnosis not present

## 2022-06-21 DIAGNOSIS — I63511 Cerebral infarction due to unspecified occlusion or stenosis of right middle cerebral artery: Secondary | ICD-10-CM | POA: Diagnosis not present

## 2022-06-21 DIAGNOSIS — R2681 Unsteadiness on feet: Secondary | ICD-10-CM | POA: Diagnosis not present

## 2022-06-24 DIAGNOSIS — K5900 Constipation, unspecified: Secondary | ICD-10-CM | POA: Diagnosis not present

## 2022-06-24 DIAGNOSIS — I119 Hypertensive heart disease without heart failure: Secondary | ICD-10-CM | POA: Diagnosis not present

## 2022-06-24 DIAGNOSIS — I63311 Cerebral infarction due to thrombosis of right middle cerebral artery: Secondary | ICD-10-CM | POA: Diagnosis not present

## 2022-06-24 DIAGNOSIS — I69354 Hemiplegia and hemiparesis following cerebral infarction affecting left non-dominant side: Secondary | ICD-10-CM | POA: Diagnosis not present

## 2022-06-24 DIAGNOSIS — Z9181 History of falling: Secondary | ICD-10-CM | POA: Diagnosis not present

## 2022-06-24 DIAGNOSIS — I6529 Occlusion and stenosis of unspecified carotid artery: Secondary | ICD-10-CM | POA: Diagnosis not present

## 2022-06-24 DIAGNOSIS — R2681 Unsteadiness on feet: Secondary | ICD-10-CM | POA: Diagnosis not present

## 2022-06-24 DIAGNOSIS — M545 Low back pain, unspecified: Secondary | ICD-10-CM | POA: Diagnosis not present

## 2022-06-24 DIAGNOSIS — G4700 Insomnia, unspecified: Secondary | ICD-10-CM | POA: Diagnosis not present

## 2022-06-24 DIAGNOSIS — H5442A3 Blindness left eye category 3, normal vision right eye: Secondary | ICD-10-CM | POA: Diagnosis not present

## 2022-06-24 DIAGNOSIS — R278 Other lack of coordination: Secondary | ICD-10-CM | POA: Diagnosis not present

## 2022-06-24 DIAGNOSIS — G8194 Hemiplegia, unspecified affecting left nondominant side: Secondary | ICD-10-CM | POA: Diagnosis not present

## 2022-06-24 DIAGNOSIS — I63511 Cerebral infarction due to unspecified occlusion or stenosis of right middle cerebral artery: Secondary | ICD-10-CM | POA: Diagnosis not present

## 2022-06-24 DIAGNOSIS — M6249 Contracture of muscle, multiple sites: Secondary | ICD-10-CM | POA: Diagnosis not present

## 2022-06-24 DIAGNOSIS — M6281 Muscle weakness (generalized): Secondary | ICD-10-CM | POA: Diagnosis not present

## 2022-06-25 DIAGNOSIS — I63511 Cerebral infarction due to unspecified occlusion or stenosis of right middle cerebral artery: Secondary | ICD-10-CM | POA: Diagnosis not present

## 2022-06-25 DIAGNOSIS — I69354 Hemiplegia and hemiparesis following cerebral infarction affecting left non-dominant side: Secondary | ICD-10-CM | POA: Diagnosis not present

## 2022-06-25 DIAGNOSIS — R278 Other lack of coordination: Secondary | ICD-10-CM | POA: Diagnosis not present

## 2022-06-25 DIAGNOSIS — M6281 Muscle weakness (generalized): Secondary | ICD-10-CM | POA: Diagnosis not present

## 2022-06-25 DIAGNOSIS — R2681 Unsteadiness on feet: Secondary | ICD-10-CM | POA: Diagnosis not present

## 2022-06-26 DIAGNOSIS — M6281 Muscle weakness (generalized): Secondary | ICD-10-CM | POA: Diagnosis not present

## 2022-06-26 DIAGNOSIS — I69354 Hemiplegia and hemiparesis following cerebral infarction affecting left non-dominant side: Secondary | ICD-10-CM | POA: Diagnosis not present

## 2022-06-26 DIAGNOSIS — I63511 Cerebral infarction due to unspecified occlusion or stenosis of right middle cerebral artery: Secondary | ICD-10-CM | POA: Diagnosis not present

## 2022-06-26 DIAGNOSIS — R278 Other lack of coordination: Secondary | ICD-10-CM | POA: Diagnosis not present

## 2022-06-26 DIAGNOSIS — R2681 Unsteadiness on feet: Secondary | ICD-10-CM | POA: Diagnosis not present

## 2022-06-27 DIAGNOSIS — R2681 Unsteadiness on feet: Secondary | ICD-10-CM | POA: Diagnosis not present

## 2022-06-27 DIAGNOSIS — R278 Other lack of coordination: Secondary | ICD-10-CM | POA: Diagnosis not present

## 2022-06-27 DIAGNOSIS — I69354 Hemiplegia and hemiparesis following cerebral infarction affecting left non-dominant side: Secondary | ICD-10-CM | POA: Diagnosis not present

## 2022-06-27 DIAGNOSIS — M6281 Muscle weakness (generalized): Secondary | ICD-10-CM | POA: Diagnosis not present

## 2022-06-27 DIAGNOSIS — I63511 Cerebral infarction due to unspecified occlusion or stenosis of right middle cerebral artery: Secondary | ICD-10-CM | POA: Diagnosis not present

## 2022-06-28 DIAGNOSIS — M6281 Muscle weakness (generalized): Secondary | ICD-10-CM | POA: Diagnosis not present

## 2022-06-28 DIAGNOSIS — R2681 Unsteadiness on feet: Secondary | ICD-10-CM | POA: Diagnosis not present

## 2022-06-28 DIAGNOSIS — I63511 Cerebral infarction due to unspecified occlusion or stenosis of right middle cerebral artery: Secondary | ICD-10-CM | POA: Diagnosis not present

## 2022-06-28 DIAGNOSIS — R278 Other lack of coordination: Secondary | ICD-10-CM | POA: Diagnosis not present

## 2022-06-28 DIAGNOSIS — Z9181 History of falling: Secondary | ICD-10-CM | POA: Diagnosis not present

## 2022-06-28 DIAGNOSIS — I69354 Hemiplegia and hemiparesis following cerebral infarction affecting left non-dominant side: Secondary | ICD-10-CM | POA: Diagnosis not present

## 2022-07-02 DIAGNOSIS — R2681 Unsteadiness on feet: Secondary | ICD-10-CM | POA: Diagnosis not present

## 2022-07-02 DIAGNOSIS — I69354 Hemiplegia and hemiparesis following cerebral infarction affecting left non-dominant side: Secondary | ICD-10-CM | POA: Diagnosis not present

## 2022-07-02 DIAGNOSIS — M6281 Muscle weakness (generalized): Secondary | ICD-10-CM | POA: Diagnosis not present

## 2022-07-02 DIAGNOSIS — I63511 Cerebral infarction due to unspecified occlusion or stenosis of right middle cerebral artery: Secondary | ICD-10-CM | POA: Diagnosis not present

## 2022-07-02 DIAGNOSIS — R278 Other lack of coordination: Secondary | ICD-10-CM | POA: Diagnosis not present

## 2022-07-02 DIAGNOSIS — Z9181 History of falling: Secondary | ICD-10-CM | POA: Diagnosis not present

## 2022-07-03 DIAGNOSIS — I69354 Hemiplegia and hemiparesis following cerebral infarction affecting left non-dominant side: Secondary | ICD-10-CM | POA: Diagnosis not present

## 2022-07-03 DIAGNOSIS — R278 Other lack of coordination: Secondary | ICD-10-CM | POA: Diagnosis not present

## 2022-07-03 DIAGNOSIS — Z9181 History of falling: Secondary | ICD-10-CM | POA: Diagnosis not present

## 2022-07-03 DIAGNOSIS — M6281 Muscle weakness (generalized): Secondary | ICD-10-CM | POA: Diagnosis not present

## 2022-07-03 DIAGNOSIS — I63511 Cerebral infarction due to unspecified occlusion or stenosis of right middle cerebral artery: Secondary | ICD-10-CM | POA: Diagnosis not present

## 2022-07-03 DIAGNOSIS — R2681 Unsteadiness on feet: Secondary | ICD-10-CM | POA: Diagnosis not present

## 2022-07-04 ENCOUNTER — Encounter: Payer: Medicare Other | Admitting: Physical Medicine & Rehabilitation

## 2022-07-04 DIAGNOSIS — M6281 Muscle weakness (generalized): Secondary | ICD-10-CM | POA: Diagnosis not present

## 2022-07-04 DIAGNOSIS — I69354 Hemiplegia and hemiparesis following cerebral infarction affecting left non-dominant side: Secondary | ICD-10-CM | POA: Diagnosis not present

## 2022-07-04 DIAGNOSIS — I63511 Cerebral infarction due to unspecified occlusion or stenosis of right middle cerebral artery: Secondary | ICD-10-CM | POA: Diagnosis not present

## 2022-07-04 DIAGNOSIS — R278 Other lack of coordination: Secondary | ICD-10-CM | POA: Diagnosis not present

## 2022-07-04 DIAGNOSIS — R2681 Unsteadiness on feet: Secondary | ICD-10-CM | POA: Diagnosis not present

## 2022-07-04 DIAGNOSIS — Z9181 History of falling: Secondary | ICD-10-CM | POA: Diagnosis not present

## 2022-07-05 DIAGNOSIS — Z9181 History of falling: Secondary | ICD-10-CM | POA: Diagnosis not present

## 2022-07-05 DIAGNOSIS — R278 Other lack of coordination: Secondary | ICD-10-CM | POA: Diagnosis not present

## 2022-07-05 DIAGNOSIS — I69354 Hemiplegia and hemiparesis following cerebral infarction affecting left non-dominant side: Secondary | ICD-10-CM | POA: Diagnosis not present

## 2022-07-05 DIAGNOSIS — I63511 Cerebral infarction due to unspecified occlusion or stenosis of right middle cerebral artery: Secondary | ICD-10-CM | POA: Diagnosis not present

## 2022-07-05 DIAGNOSIS — R2681 Unsteadiness on feet: Secondary | ICD-10-CM | POA: Diagnosis not present

## 2022-07-05 DIAGNOSIS — M6281 Muscle weakness (generalized): Secondary | ICD-10-CM | POA: Diagnosis not present

## 2022-07-08 DIAGNOSIS — I63511 Cerebral infarction due to unspecified occlusion or stenosis of right middle cerebral artery: Secondary | ICD-10-CM | POA: Diagnosis not present

## 2022-07-08 DIAGNOSIS — R2681 Unsteadiness on feet: Secondary | ICD-10-CM | POA: Diagnosis not present

## 2022-07-08 DIAGNOSIS — R278 Other lack of coordination: Secondary | ICD-10-CM | POA: Diagnosis not present

## 2022-07-08 DIAGNOSIS — M6281 Muscle weakness (generalized): Secondary | ICD-10-CM | POA: Diagnosis not present

## 2022-07-08 DIAGNOSIS — I69354 Hemiplegia and hemiparesis following cerebral infarction affecting left non-dominant side: Secondary | ICD-10-CM | POA: Diagnosis not present

## 2022-07-08 DIAGNOSIS — Z9181 History of falling: Secondary | ICD-10-CM | POA: Diagnosis not present

## 2022-07-09 DIAGNOSIS — Z8673 Personal history of transient ischemic attack (TIA), and cerebral infarction without residual deficits: Secondary | ICD-10-CM | POA: Diagnosis not present

## 2022-07-09 DIAGNOSIS — I69354 Hemiplegia and hemiparesis following cerebral infarction affecting left non-dominant side: Secondary | ICD-10-CM | POA: Diagnosis not present

## 2022-07-09 DIAGNOSIS — Z7982 Long term (current) use of aspirin: Secondary | ICD-10-CM | POA: Diagnosis not present

## 2022-07-09 DIAGNOSIS — M792 Neuralgia and neuritis, unspecified: Secondary | ICD-10-CM | POA: Diagnosis not present

## 2022-07-09 DIAGNOSIS — K5900 Constipation, unspecified: Secondary | ICD-10-CM | POA: Diagnosis not present

## 2022-07-09 DIAGNOSIS — M545 Low back pain, unspecified: Secondary | ICD-10-CM | POA: Diagnosis not present

## 2022-07-09 DIAGNOSIS — I10 Essential (primary) hypertension: Secondary | ICD-10-CM | POA: Diagnosis not present

## 2022-07-09 DIAGNOSIS — G4700 Insomnia, unspecified: Secondary | ICD-10-CM | POA: Diagnosis not present

## 2022-07-09 DIAGNOSIS — R278 Other lack of coordination: Secondary | ICD-10-CM | POA: Diagnosis not present

## 2022-07-09 DIAGNOSIS — M6281 Muscle weakness (generalized): Secondary | ICD-10-CM | POA: Diagnosis not present

## 2022-07-09 DIAGNOSIS — R2681 Unsteadiness on feet: Secondary | ICD-10-CM | POA: Diagnosis not present

## 2022-07-09 DIAGNOSIS — E785 Hyperlipidemia, unspecified: Secondary | ICD-10-CM | POA: Diagnosis not present

## 2022-07-09 DIAGNOSIS — I63511 Cerebral infarction due to unspecified occlusion or stenosis of right middle cerebral artery: Secondary | ICD-10-CM | POA: Diagnosis not present

## 2022-07-09 DIAGNOSIS — Z9181 History of falling: Secondary | ICD-10-CM | POA: Diagnosis not present

## 2022-07-10 DIAGNOSIS — R278 Other lack of coordination: Secondary | ICD-10-CM | POA: Diagnosis not present

## 2022-07-10 DIAGNOSIS — M6281 Muscle weakness (generalized): Secondary | ICD-10-CM | POA: Diagnosis not present

## 2022-07-10 DIAGNOSIS — R2681 Unsteadiness on feet: Secondary | ICD-10-CM | POA: Diagnosis not present

## 2022-07-10 DIAGNOSIS — I63511 Cerebral infarction due to unspecified occlusion or stenosis of right middle cerebral artery: Secondary | ICD-10-CM | POA: Diagnosis not present

## 2022-07-10 DIAGNOSIS — Z9181 History of falling: Secondary | ICD-10-CM | POA: Diagnosis not present

## 2022-07-10 DIAGNOSIS — I69354 Hemiplegia and hemiparesis following cerebral infarction affecting left non-dominant side: Secondary | ICD-10-CM | POA: Diagnosis not present

## 2022-07-11 DIAGNOSIS — Z9181 History of falling: Secondary | ICD-10-CM | POA: Diagnosis not present

## 2022-07-11 DIAGNOSIS — I69354 Hemiplegia and hemiparesis following cerebral infarction affecting left non-dominant side: Secondary | ICD-10-CM | POA: Diagnosis not present

## 2022-07-11 DIAGNOSIS — I63511 Cerebral infarction due to unspecified occlusion or stenosis of right middle cerebral artery: Secondary | ICD-10-CM | POA: Diagnosis not present

## 2022-07-11 DIAGNOSIS — R2681 Unsteadiness on feet: Secondary | ICD-10-CM | POA: Diagnosis not present

## 2022-07-11 DIAGNOSIS — M6281 Muscle weakness (generalized): Secondary | ICD-10-CM | POA: Diagnosis not present

## 2022-07-11 DIAGNOSIS — R278 Other lack of coordination: Secondary | ICD-10-CM | POA: Diagnosis not present

## 2022-07-12 DIAGNOSIS — I63511 Cerebral infarction due to unspecified occlusion or stenosis of right middle cerebral artery: Secondary | ICD-10-CM | POA: Diagnosis not present

## 2022-07-12 DIAGNOSIS — Z9181 History of falling: Secondary | ICD-10-CM | POA: Diagnosis not present

## 2022-07-12 DIAGNOSIS — R2681 Unsteadiness on feet: Secondary | ICD-10-CM | POA: Diagnosis not present

## 2022-07-12 DIAGNOSIS — M6281 Muscle weakness (generalized): Secondary | ICD-10-CM | POA: Diagnosis not present

## 2022-07-12 DIAGNOSIS — I69354 Hemiplegia and hemiparesis following cerebral infarction affecting left non-dominant side: Secondary | ICD-10-CM | POA: Diagnosis not present

## 2022-07-12 DIAGNOSIS — R278 Other lack of coordination: Secondary | ICD-10-CM | POA: Diagnosis not present

## 2022-07-13 DIAGNOSIS — I10 Essential (primary) hypertension: Secondary | ICD-10-CM | POA: Diagnosis not present

## 2022-07-13 DIAGNOSIS — Z9181 History of falling: Secondary | ICD-10-CM | POA: Diagnosis not present

## 2022-07-13 DIAGNOSIS — I63511 Cerebral infarction due to unspecified occlusion or stenosis of right middle cerebral artery: Secondary | ICD-10-CM | POA: Diagnosis not present

## 2022-07-13 DIAGNOSIS — R2681 Unsteadiness on feet: Secondary | ICD-10-CM | POA: Diagnosis not present

## 2022-07-13 DIAGNOSIS — R278 Other lack of coordination: Secondary | ICD-10-CM | POA: Diagnosis not present

## 2022-07-13 DIAGNOSIS — M6281 Muscle weakness (generalized): Secondary | ICD-10-CM | POA: Diagnosis not present

## 2022-07-13 DIAGNOSIS — I69354 Hemiplegia and hemiparesis following cerebral infarction affecting left non-dominant side: Secondary | ICD-10-CM | POA: Diagnosis not present

## 2022-07-15 DIAGNOSIS — Z9181 History of falling: Secondary | ICD-10-CM | POA: Diagnosis not present

## 2022-07-15 DIAGNOSIS — I63511 Cerebral infarction due to unspecified occlusion or stenosis of right middle cerebral artery: Secondary | ICD-10-CM | POA: Diagnosis not present

## 2022-07-15 DIAGNOSIS — R278 Other lack of coordination: Secondary | ICD-10-CM | POA: Diagnosis not present

## 2022-07-15 DIAGNOSIS — M6281 Muscle weakness (generalized): Secondary | ICD-10-CM | POA: Diagnosis not present

## 2022-07-15 DIAGNOSIS — R2681 Unsteadiness on feet: Secondary | ICD-10-CM | POA: Diagnosis not present

## 2022-07-15 DIAGNOSIS — I69354 Hemiplegia and hemiparesis following cerebral infarction affecting left non-dominant side: Secondary | ICD-10-CM | POA: Diagnosis not present

## 2022-07-16 DIAGNOSIS — R278 Other lack of coordination: Secondary | ICD-10-CM | POA: Diagnosis not present

## 2022-07-16 DIAGNOSIS — Z9181 History of falling: Secondary | ICD-10-CM | POA: Diagnosis not present

## 2022-07-16 DIAGNOSIS — M6281 Muscle weakness (generalized): Secondary | ICD-10-CM | POA: Diagnosis not present

## 2022-07-16 DIAGNOSIS — R2681 Unsteadiness on feet: Secondary | ICD-10-CM | POA: Diagnosis not present

## 2022-07-16 DIAGNOSIS — I69354 Hemiplegia and hemiparesis following cerebral infarction affecting left non-dominant side: Secondary | ICD-10-CM | POA: Diagnosis not present

## 2022-07-16 DIAGNOSIS — I63511 Cerebral infarction due to unspecified occlusion or stenosis of right middle cerebral artery: Secondary | ICD-10-CM | POA: Diagnosis not present

## 2022-07-17 DIAGNOSIS — I69354 Hemiplegia and hemiparesis following cerebral infarction affecting left non-dominant side: Secondary | ICD-10-CM | POA: Diagnosis not present

## 2022-07-17 DIAGNOSIS — R278 Other lack of coordination: Secondary | ICD-10-CM | POA: Diagnosis not present

## 2022-07-17 DIAGNOSIS — Z9181 History of falling: Secondary | ICD-10-CM | POA: Diagnosis not present

## 2022-07-17 DIAGNOSIS — I63511 Cerebral infarction due to unspecified occlusion or stenosis of right middle cerebral artery: Secondary | ICD-10-CM | POA: Diagnosis not present

## 2022-07-17 DIAGNOSIS — M6281 Muscle weakness (generalized): Secondary | ICD-10-CM | POA: Diagnosis not present

## 2022-07-17 DIAGNOSIS — R2681 Unsteadiness on feet: Secondary | ICD-10-CM | POA: Diagnosis not present

## 2022-07-18 DIAGNOSIS — M6281 Muscle weakness (generalized): Secondary | ICD-10-CM | POA: Diagnosis not present

## 2022-07-18 DIAGNOSIS — R278 Other lack of coordination: Secondary | ICD-10-CM | POA: Diagnosis not present

## 2022-07-18 DIAGNOSIS — I63511 Cerebral infarction due to unspecified occlusion or stenosis of right middle cerebral artery: Secondary | ICD-10-CM | POA: Diagnosis not present

## 2022-07-18 DIAGNOSIS — R2681 Unsteadiness on feet: Secondary | ICD-10-CM | POA: Diagnosis not present

## 2022-07-18 DIAGNOSIS — I69354 Hemiplegia and hemiparesis following cerebral infarction affecting left non-dominant side: Secondary | ICD-10-CM | POA: Diagnosis not present

## 2022-07-18 DIAGNOSIS — Z9181 History of falling: Secondary | ICD-10-CM | POA: Diagnosis not present

## 2022-07-19 DIAGNOSIS — M6281 Muscle weakness (generalized): Secondary | ICD-10-CM | POA: Diagnosis not present

## 2022-07-19 DIAGNOSIS — R2681 Unsteadiness on feet: Secondary | ICD-10-CM | POA: Diagnosis not present

## 2022-07-19 DIAGNOSIS — R278 Other lack of coordination: Secondary | ICD-10-CM | POA: Diagnosis not present

## 2022-07-19 DIAGNOSIS — Z9181 History of falling: Secondary | ICD-10-CM | POA: Diagnosis not present

## 2022-07-19 DIAGNOSIS — I63511 Cerebral infarction due to unspecified occlusion or stenosis of right middle cerebral artery: Secondary | ICD-10-CM | POA: Diagnosis not present

## 2022-07-19 DIAGNOSIS — I69354 Hemiplegia and hemiparesis following cerebral infarction affecting left non-dominant side: Secondary | ICD-10-CM | POA: Diagnosis not present

## 2022-07-23 DIAGNOSIS — R278 Other lack of coordination: Secondary | ICD-10-CM | POA: Diagnosis not present

## 2022-07-23 DIAGNOSIS — I63511 Cerebral infarction due to unspecified occlusion or stenosis of right middle cerebral artery: Secondary | ICD-10-CM | POA: Diagnosis not present

## 2022-07-23 DIAGNOSIS — I69354 Hemiplegia and hemiparesis following cerebral infarction affecting left non-dominant side: Secondary | ICD-10-CM | POA: Diagnosis not present

## 2022-07-23 DIAGNOSIS — Z9181 History of falling: Secondary | ICD-10-CM | POA: Diagnosis not present

## 2022-07-23 DIAGNOSIS — M6281 Muscle weakness (generalized): Secondary | ICD-10-CM | POA: Diagnosis not present

## 2022-07-23 DIAGNOSIS — R2681 Unsteadiness on feet: Secondary | ICD-10-CM | POA: Diagnosis not present

## 2022-07-24 DIAGNOSIS — I63511 Cerebral infarction due to unspecified occlusion or stenosis of right middle cerebral artery: Secondary | ICD-10-CM | POA: Diagnosis not present

## 2022-07-24 DIAGNOSIS — I69354 Hemiplegia and hemiparesis following cerebral infarction affecting left non-dominant side: Secondary | ICD-10-CM | POA: Diagnosis not present

## 2022-07-24 DIAGNOSIS — R278 Other lack of coordination: Secondary | ICD-10-CM | POA: Diagnosis not present

## 2022-07-24 DIAGNOSIS — R2681 Unsteadiness on feet: Secondary | ICD-10-CM | POA: Diagnosis not present

## 2022-07-24 DIAGNOSIS — Z9181 History of falling: Secondary | ICD-10-CM | POA: Diagnosis not present

## 2022-07-24 DIAGNOSIS — M6281 Muscle weakness (generalized): Secondary | ICD-10-CM | POA: Diagnosis not present

## 2022-07-25 DIAGNOSIS — I69354 Hemiplegia and hemiparesis following cerebral infarction affecting left non-dominant side: Secondary | ICD-10-CM | POA: Diagnosis not present

## 2022-07-25 DIAGNOSIS — M76892 Other specified enthesopathies of left lower limb, excluding foot: Secondary | ICD-10-CM | POA: Diagnosis not present

## 2022-07-25 DIAGNOSIS — M792 Neuralgia and neuritis, unspecified: Secondary | ICD-10-CM | POA: Diagnosis not present

## 2022-07-25 DIAGNOSIS — I63511 Cerebral infarction due to unspecified occlusion or stenosis of right middle cerebral artery: Secondary | ICD-10-CM | POA: Diagnosis not present

## 2022-07-25 DIAGNOSIS — R278 Other lack of coordination: Secondary | ICD-10-CM | POA: Diagnosis not present

## 2022-07-25 DIAGNOSIS — R2681 Unsteadiness on feet: Secondary | ICD-10-CM | POA: Diagnosis not present

## 2022-07-25 DIAGNOSIS — Z0189 Encounter for other specified special examinations: Secondary | ICD-10-CM | POA: Diagnosis not present

## 2022-07-25 DIAGNOSIS — Z9181 History of falling: Secondary | ICD-10-CM | POA: Diagnosis not present

## 2022-07-25 DIAGNOSIS — M6281 Muscle weakness (generalized): Secondary | ICD-10-CM | POA: Diagnosis not present

## 2022-07-26 DIAGNOSIS — H5442A3 Blindness left eye category 3, normal vision right eye: Secondary | ICD-10-CM | POA: Diagnosis not present

## 2022-07-26 DIAGNOSIS — R1310 Dysphagia, unspecified: Secondary | ICD-10-CM | POA: Diagnosis not present

## 2022-07-26 DIAGNOSIS — Z9181 History of falling: Secondary | ICD-10-CM | POA: Diagnosis not present

## 2022-07-26 DIAGNOSIS — R2681 Unsteadiness on feet: Secondary | ICD-10-CM | POA: Diagnosis not present

## 2022-07-26 DIAGNOSIS — M6249 Contracture of muscle, multiple sites: Secondary | ICD-10-CM | POA: Diagnosis not present

## 2022-07-26 DIAGNOSIS — I6529 Occlusion and stenosis of unspecified carotid artery: Secondary | ICD-10-CM | POA: Diagnosis not present

## 2022-07-26 DIAGNOSIS — R278 Other lack of coordination: Secondary | ICD-10-CM | POA: Diagnosis not present

## 2022-07-26 DIAGNOSIS — K5900 Constipation, unspecified: Secondary | ICD-10-CM | POA: Diagnosis not present

## 2022-07-26 DIAGNOSIS — Z7982 Long term (current) use of aspirin: Secondary | ICD-10-CM | POA: Diagnosis not present

## 2022-07-26 DIAGNOSIS — G4700 Insomnia, unspecified: Secondary | ICD-10-CM | POA: Diagnosis not present

## 2022-07-26 DIAGNOSIS — M6281 Muscle weakness (generalized): Secondary | ICD-10-CM | POA: Diagnosis not present

## 2022-07-26 DIAGNOSIS — I69391 Dysphagia following cerebral infarction: Secondary | ICD-10-CM | POA: Diagnosis not present

## 2022-07-26 DIAGNOSIS — I69354 Hemiplegia and hemiparesis following cerebral infarction affecting left non-dominant side: Secondary | ICD-10-CM | POA: Diagnosis not present

## 2022-07-26 DIAGNOSIS — I63511 Cerebral infarction due to unspecified occlusion or stenosis of right middle cerebral artery: Secondary | ICD-10-CM | POA: Diagnosis not present

## 2022-07-26 DIAGNOSIS — I10 Essential (primary) hypertension: Secondary | ICD-10-CM | POA: Diagnosis not present

## 2022-07-26 DIAGNOSIS — I63311 Cerebral infarction due to thrombosis of right middle cerebral artery: Secondary | ICD-10-CM | POA: Diagnosis not present

## 2022-07-26 DIAGNOSIS — M545 Low back pain, unspecified: Secondary | ICD-10-CM | POA: Diagnosis not present

## 2022-07-26 DIAGNOSIS — I119 Hypertensive heart disease without heart failure: Secondary | ICD-10-CM | POA: Diagnosis not present

## 2022-07-26 DIAGNOSIS — G8194 Hemiplegia, unspecified affecting left nondominant side: Secondary | ICD-10-CM | POA: Diagnosis not present

## 2022-07-27 DIAGNOSIS — Z9181 History of falling: Secondary | ICD-10-CM | POA: Diagnosis not present

## 2022-07-27 DIAGNOSIS — M6281 Muscle weakness (generalized): Secondary | ICD-10-CM | POA: Diagnosis not present

## 2022-07-27 DIAGNOSIS — I69354 Hemiplegia and hemiparesis following cerebral infarction affecting left non-dominant side: Secondary | ICD-10-CM | POA: Diagnosis not present

## 2022-07-27 DIAGNOSIS — R2681 Unsteadiness on feet: Secondary | ICD-10-CM | POA: Diagnosis not present

## 2022-07-27 DIAGNOSIS — I63511 Cerebral infarction due to unspecified occlusion or stenosis of right middle cerebral artery: Secondary | ICD-10-CM | POA: Diagnosis not present

## 2022-07-27 DIAGNOSIS — R278 Other lack of coordination: Secondary | ICD-10-CM | POA: Diagnosis not present

## 2022-08-05 DIAGNOSIS — I779 Disorder of arteries and arterioles, unspecified: Secondary | ICD-10-CM | POA: Diagnosis not present

## 2022-08-05 DIAGNOSIS — M76892 Other specified enthesopathies of left lower limb, excluding foot: Secondary | ICD-10-CM | POA: Diagnosis not present

## 2022-08-05 DIAGNOSIS — Z9181 History of falling: Secondary | ICD-10-CM | POA: Diagnosis not present

## 2022-08-05 DIAGNOSIS — E785 Hyperlipidemia, unspecified: Secondary | ICD-10-CM | POA: Diagnosis not present

## 2022-08-05 DIAGNOSIS — I10 Essential (primary) hypertension: Secondary | ICD-10-CM | POA: Diagnosis not present

## 2022-08-05 DIAGNOSIS — K5901 Slow transit constipation: Secondary | ICD-10-CM | POA: Diagnosis not present

## 2022-08-05 DIAGNOSIS — R1310 Dysphagia, unspecified: Secondary | ICD-10-CM | POA: Diagnosis not present

## 2022-08-05 DIAGNOSIS — G4700 Insomnia, unspecified: Secondary | ICD-10-CM | POA: Diagnosis not present

## 2022-08-05 DIAGNOSIS — Z7982 Long term (current) use of aspirin: Secondary | ICD-10-CM | POA: Diagnosis not present

## 2022-08-05 DIAGNOSIS — S0592XD Unspecified injury of left eye and orbit, subsequent encounter: Secondary | ICD-10-CM | POA: Diagnosis not present

## 2022-08-05 DIAGNOSIS — I69354 Hemiplegia and hemiparesis following cerebral infarction affecting left non-dominant side: Secondary | ICD-10-CM | POA: Diagnosis not present

## 2022-08-08 DIAGNOSIS — E785 Hyperlipidemia, unspecified: Secondary | ICD-10-CM | POA: Diagnosis not present

## 2022-08-08 DIAGNOSIS — M76892 Other specified enthesopathies of left lower limb, excluding foot: Secondary | ICD-10-CM | POA: Diagnosis not present

## 2022-08-08 DIAGNOSIS — G4700 Insomnia, unspecified: Secondary | ICD-10-CM | POA: Diagnosis not present

## 2022-08-08 DIAGNOSIS — I69354 Hemiplegia and hemiparesis following cerebral infarction affecting left non-dominant side: Secondary | ICD-10-CM | POA: Diagnosis not present

## 2022-08-08 DIAGNOSIS — I10 Essential (primary) hypertension: Secondary | ICD-10-CM | POA: Diagnosis not present

## 2022-08-08 DIAGNOSIS — R1310 Dysphagia, unspecified: Secondary | ICD-10-CM | POA: Diagnosis not present

## 2022-08-14 DIAGNOSIS — G4700 Insomnia, unspecified: Secondary | ICD-10-CM | POA: Diagnosis not present

## 2022-08-14 DIAGNOSIS — M76892 Other specified enthesopathies of left lower limb, excluding foot: Secondary | ICD-10-CM | POA: Diagnosis not present

## 2022-08-14 DIAGNOSIS — E785 Hyperlipidemia, unspecified: Secondary | ICD-10-CM | POA: Diagnosis not present

## 2022-08-14 DIAGNOSIS — I10 Essential (primary) hypertension: Secondary | ICD-10-CM | POA: Diagnosis not present

## 2022-08-14 DIAGNOSIS — I69354 Hemiplegia and hemiparesis following cerebral infarction affecting left non-dominant side: Secondary | ICD-10-CM | POA: Diagnosis not present

## 2022-08-14 DIAGNOSIS — R1310 Dysphagia, unspecified: Secondary | ICD-10-CM | POA: Diagnosis not present

## 2022-08-16 DIAGNOSIS — G4700 Insomnia, unspecified: Secondary | ICD-10-CM | POA: Diagnosis not present

## 2022-08-16 DIAGNOSIS — R1310 Dysphagia, unspecified: Secondary | ICD-10-CM | POA: Diagnosis not present

## 2022-08-16 DIAGNOSIS — I10 Essential (primary) hypertension: Secondary | ICD-10-CM | POA: Diagnosis not present

## 2022-08-16 DIAGNOSIS — M76892 Other specified enthesopathies of left lower limb, excluding foot: Secondary | ICD-10-CM | POA: Diagnosis not present

## 2022-08-16 DIAGNOSIS — E785 Hyperlipidemia, unspecified: Secondary | ICD-10-CM | POA: Diagnosis not present

## 2022-08-16 DIAGNOSIS — I69354 Hemiplegia and hemiparesis following cerebral infarction affecting left non-dominant side: Secondary | ICD-10-CM | POA: Diagnosis not present

## 2022-08-19 DIAGNOSIS — I10 Essential (primary) hypertension: Secondary | ICD-10-CM | POA: Diagnosis not present

## 2022-08-19 DIAGNOSIS — M76892 Other specified enthesopathies of left lower limb, excluding foot: Secondary | ICD-10-CM | POA: Diagnosis not present

## 2022-08-19 DIAGNOSIS — E785 Hyperlipidemia, unspecified: Secondary | ICD-10-CM | POA: Diagnosis not present

## 2022-08-19 DIAGNOSIS — R1310 Dysphagia, unspecified: Secondary | ICD-10-CM | POA: Diagnosis not present

## 2022-08-19 DIAGNOSIS — I69354 Hemiplegia and hemiparesis following cerebral infarction affecting left non-dominant side: Secondary | ICD-10-CM | POA: Diagnosis not present

## 2022-08-19 DIAGNOSIS — G4700 Insomnia, unspecified: Secondary | ICD-10-CM | POA: Diagnosis not present

## 2022-08-22 DIAGNOSIS — E785 Hyperlipidemia, unspecified: Secondary | ICD-10-CM | POA: Diagnosis not present

## 2022-08-22 DIAGNOSIS — I69354 Hemiplegia and hemiparesis following cerebral infarction affecting left non-dominant side: Secondary | ICD-10-CM | POA: Diagnosis not present

## 2022-08-22 DIAGNOSIS — R1310 Dysphagia, unspecified: Secondary | ICD-10-CM | POA: Diagnosis not present

## 2022-08-22 DIAGNOSIS — M76892 Other specified enthesopathies of left lower limb, excluding foot: Secondary | ICD-10-CM | POA: Diagnosis not present

## 2022-08-22 DIAGNOSIS — I10 Essential (primary) hypertension: Secondary | ICD-10-CM | POA: Diagnosis not present

## 2022-08-22 DIAGNOSIS — G4700 Insomnia, unspecified: Secondary | ICD-10-CM | POA: Diagnosis not present

## 2022-08-26 DIAGNOSIS — E785 Hyperlipidemia, unspecified: Secondary | ICD-10-CM | POA: Diagnosis not present

## 2022-08-26 DIAGNOSIS — G4700 Insomnia, unspecified: Secondary | ICD-10-CM | POA: Diagnosis not present

## 2022-08-26 DIAGNOSIS — R1310 Dysphagia, unspecified: Secondary | ICD-10-CM | POA: Diagnosis not present

## 2022-08-26 DIAGNOSIS — I69354 Hemiplegia and hemiparesis following cerebral infarction affecting left non-dominant side: Secondary | ICD-10-CM | POA: Diagnosis not present

## 2022-08-26 DIAGNOSIS — I10 Essential (primary) hypertension: Secondary | ICD-10-CM | POA: Diagnosis not present

## 2022-08-26 DIAGNOSIS — M76892 Other specified enthesopathies of left lower limb, excluding foot: Secondary | ICD-10-CM | POA: Diagnosis not present

## 2022-08-28 DIAGNOSIS — R1310 Dysphagia, unspecified: Secondary | ICD-10-CM | POA: Diagnosis not present

## 2022-08-28 DIAGNOSIS — E785 Hyperlipidemia, unspecified: Secondary | ICD-10-CM | POA: Diagnosis not present

## 2022-08-28 DIAGNOSIS — G4700 Insomnia, unspecified: Secondary | ICD-10-CM | POA: Diagnosis not present

## 2022-08-28 DIAGNOSIS — I69354 Hemiplegia and hemiparesis following cerebral infarction affecting left non-dominant side: Secondary | ICD-10-CM | POA: Diagnosis not present

## 2022-08-28 DIAGNOSIS — I10 Essential (primary) hypertension: Secondary | ICD-10-CM | POA: Diagnosis not present

## 2022-08-28 DIAGNOSIS — M76892 Other specified enthesopathies of left lower limb, excluding foot: Secondary | ICD-10-CM | POA: Diagnosis not present

## 2022-08-30 DIAGNOSIS — E785 Hyperlipidemia, unspecified: Secondary | ICD-10-CM | POA: Diagnosis not present

## 2022-08-30 DIAGNOSIS — I69354 Hemiplegia and hemiparesis following cerebral infarction affecting left non-dominant side: Secondary | ICD-10-CM | POA: Diagnosis not present

## 2022-08-30 DIAGNOSIS — M76892 Other specified enthesopathies of left lower limb, excluding foot: Secondary | ICD-10-CM | POA: Diagnosis not present

## 2022-08-30 DIAGNOSIS — G4700 Insomnia, unspecified: Secondary | ICD-10-CM | POA: Diagnosis not present

## 2022-08-30 DIAGNOSIS — R1310 Dysphagia, unspecified: Secondary | ICD-10-CM | POA: Diagnosis not present

## 2022-08-30 DIAGNOSIS — I10 Essential (primary) hypertension: Secondary | ICD-10-CM | POA: Diagnosis not present

## 2022-09-02 DIAGNOSIS — I10 Essential (primary) hypertension: Secondary | ICD-10-CM | POA: Diagnosis not present

## 2022-09-02 DIAGNOSIS — R1310 Dysphagia, unspecified: Secondary | ICD-10-CM | POA: Diagnosis not present

## 2022-09-02 DIAGNOSIS — I69354 Hemiplegia and hemiparesis following cerebral infarction affecting left non-dominant side: Secondary | ICD-10-CM | POA: Diagnosis not present

## 2022-09-02 DIAGNOSIS — E785 Hyperlipidemia, unspecified: Secondary | ICD-10-CM | POA: Diagnosis not present

## 2022-09-02 DIAGNOSIS — G4700 Insomnia, unspecified: Secondary | ICD-10-CM | POA: Diagnosis not present

## 2022-09-02 DIAGNOSIS — M76892 Other specified enthesopathies of left lower limb, excluding foot: Secondary | ICD-10-CM | POA: Diagnosis not present

## 2022-09-04 DIAGNOSIS — Z7982 Long term (current) use of aspirin: Secondary | ICD-10-CM | POA: Diagnosis not present

## 2022-09-04 DIAGNOSIS — S0592XD Unspecified injury of left eye and orbit, subsequent encounter: Secondary | ICD-10-CM | POA: Diagnosis not present

## 2022-09-04 DIAGNOSIS — K5901 Slow transit constipation: Secondary | ICD-10-CM | POA: Diagnosis not present

## 2022-09-04 DIAGNOSIS — I779 Disorder of arteries and arterioles, unspecified: Secondary | ICD-10-CM | POA: Diagnosis not present

## 2022-09-04 DIAGNOSIS — E785 Hyperlipidemia, unspecified: Secondary | ICD-10-CM | POA: Diagnosis not present

## 2022-09-04 DIAGNOSIS — R1310 Dysphagia, unspecified: Secondary | ICD-10-CM | POA: Diagnosis not present

## 2022-09-04 DIAGNOSIS — I69354 Hemiplegia and hemiparesis following cerebral infarction affecting left non-dominant side: Secondary | ICD-10-CM | POA: Diagnosis not present

## 2022-09-04 DIAGNOSIS — G4700 Insomnia, unspecified: Secondary | ICD-10-CM | POA: Diagnosis not present

## 2022-09-04 DIAGNOSIS — Z9181 History of falling: Secondary | ICD-10-CM | POA: Diagnosis not present

## 2022-09-04 DIAGNOSIS — M76892 Other specified enthesopathies of left lower limb, excluding foot: Secondary | ICD-10-CM | POA: Diagnosis not present

## 2022-09-04 DIAGNOSIS — I10 Essential (primary) hypertension: Secondary | ICD-10-CM | POA: Diagnosis not present

## 2022-09-06 DIAGNOSIS — R1310 Dysphagia, unspecified: Secondary | ICD-10-CM | POA: Diagnosis not present

## 2022-09-06 DIAGNOSIS — I69354 Hemiplegia and hemiparesis following cerebral infarction affecting left non-dominant side: Secondary | ICD-10-CM | POA: Diagnosis not present

## 2022-09-06 DIAGNOSIS — G4700 Insomnia, unspecified: Secondary | ICD-10-CM | POA: Diagnosis not present

## 2022-09-06 DIAGNOSIS — E785 Hyperlipidemia, unspecified: Secondary | ICD-10-CM | POA: Diagnosis not present

## 2022-09-06 DIAGNOSIS — M76892 Other specified enthesopathies of left lower limb, excluding foot: Secondary | ICD-10-CM | POA: Diagnosis not present

## 2022-09-06 DIAGNOSIS — I10 Essential (primary) hypertension: Secondary | ICD-10-CM | POA: Diagnosis not present

## 2022-09-09 DIAGNOSIS — R1310 Dysphagia, unspecified: Secondary | ICD-10-CM | POA: Diagnosis not present

## 2022-09-09 DIAGNOSIS — G4700 Insomnia, unspecified: Secondary | ICD-10-CM | POA: Diagnosis not present

## 2022-09-09 DIAGNOSIS — I69354 Hemiplegia and hemiparesis following cerebral infarction affecting left non-dominant side: Secondary | ICD-10-CM | POA: Diagnosis not present

## 2022-09-09 DIAGNOSIS — M76892 Other specified enthesopathies of left lower limb, excluding foot: Secondary | ICD-10-CM | POA: Diagnosis not present

## 2022-09-09 DIAGNOSIS — I10 Essential (primary) hypertension: Secondary | ICD-10-CM | POA: Diagnosis not present

## 2022-09-09 DIAGNOSIS — E785 Hyperlipidemia, unspecified: Secondary | ICD-10-CM | POA: Diagnosis not present

## 2022-09-11 DIAGNOSIS — M76892 Other specified enthesopathies of left lower limb, excluding foot: Secondary | ICD-10-CM | POA: Diagnosis not present

## 2022-09-11 DIAGNOSIS — E785 Hyperlipidemia, unspecified: Secondary | ICD-10-CM | POA: Diagnosis not present

## 2022-09-11 DIAGNOSIS — I10 Essential (primary) hypertension: Secondary | ICD-10-CM | POA: Diagnosis not present

## 2022-09-11 DIAGNOSIS — I69354 Hemiplegia and hemiparesis following cerebral infarction affecting left non-dominant side: Secondary | ICD-10-CM | POA: Diagnosis not present

## 2022-09-11 DIAGNOSIS — G4700 Insomnia, unspecified: Secondary | ICD-10-CM | POA: Diagnosis not present

## 2022-09-11 DIAGNOSIS — R1310 Dysphagia, unspecified: Secondary | ICD-10-CM | POA: Diagnosis not present

## 2022-09-16 ENCOUNTER — Ambulatory Visit: Payer: Medicare Other | Admitting: Nurse Practitioner

## 2022-09-19 DIAGNOSIS — M76892 Other specified enthesopathies of left lower limb, excluding foot: Secondary | ICD-10-CM | POA: Diagnosis not present

## 2022-09-19 DIAGNOSIS — R1310 Dysphagia, unspecified: Secondary | ICD-10-CM | POA: Diagnosis not present

## 2022-09-19 DIAGNOSIS — I69354 Hemiplegia and hemiparesis following cerebral infarction affecting left non-dominant side: Secondary | ICD-10-CM | POA: Diagnosis not present

## 2022-09-19 DIAGNOSIS — I10 Essential (primary) hypertension: Secondary | ICD-10-CM | POA: Diagnosis not present

## 2022-09-19 DIAGNOSIS — E785 Hyperlipidemia, unspecified: Secondary | ICD-10-CM | POA: Diagnosis not present

## 2022-09-19 DIAGNOSIS — G4700 Insomnia, unspecified: Secondary | ICD-10-CM | POA: Diagnosis not present

## 2022-09-20 DIAGNOSIS — G4700 Insomnia, unspecified: Secondary | ICD-10-CM | POA: Diagnosis not present

## 2022-09-20 DIAGNOSIS — R1310 Dysphagia, unspecified: Secondary | ICD-10-CM | POA: Diagnosis not present

## 2022-09-20 DIAGNOSIS — I10 Essential (primary) hypertension: Secondary | ICD-10-CM | POA: Diagnosis not present

## 2022-09-20 DIAGNOSIS — I69354 Hemiplegia and hemiparesis following cerebral infarction affecting left non-dominant side: Secondary | ICD-10-CM | POA: Diagnosis not present

## 2022-09-20 DIAGNOSIS — M76892 Other specified enthesopathies of left lower limb, excluding foot: Secondary | ICD-10-CM | POA: Diagnosis not present

## 2022-09-20 DIAGNOSIS — E785 Hyperlipidemia, unspecified: Secondary | ICD-10-CM | POA: Diagnosis not present

## 2022-09-23 DIAGNOSIS — I10 Essential (primary) hypertension: Secondary | ICD-10-CM | POA: Diagnosis not present

## 2022-09-23 DIAGNOSIS — I69354 Hemiplegia and hemiparesis following cerebral infarction affecting left non-dominant side: Secondary | ICD-10-CM | POA: Diagnosis not present

## 2022-09-24 ENCOUNTER — Ambulatory Visit (INDEPENDENT_AMBULATORY_CARE_PROVIDER_SITE_OTHER): Payer: Medicare Other | Admitting: Nurse Practitioner

## 2022-09-24 VITALS — BP 138/72 | HR 98 | Temp 98.9°F | Ht 62.0 in | Wt 134.0 lb

## 2022-09-24 DIAGNOSIS — F321 Major depressive disorder, single episode, moderate: Secondary | ICD-10-CM

## 2022-09-24 DIAGNOSIS — Z741 Need for assistance with personal care: Secondary | ICD-10-CM

## 2022-09-24 DIAGNOSIS — I10 Essential (primary) hypertension: Secondary | ICD-10-CM

## 2022-09-24 DIAGNOSIS — I69354 Hemiplegia and hemiparesis following cerebral infarction affecting left non-dominant side: Secondary | ICD-10-CM | POA: Diagnosis not present

## 2022-09-24 DIAGNOSIS — E782 Mixed hyperlipidemia: Secondary | ICD-10-CM

## 2022-09-24 DIAGNOSIS — Z1211 Encounter for screening for malignant neoplasm of colon: Secondary | ICD-10-CM

## 2022-09-24 DIAGNOSIS — Z8673 Personal history of transient ischemic attack (TIA), and cerebral infarction without residual deficits: Secondary | ICD-10-CM

## 2022-09-24 MED ORDER — ATORVASTATIN CALCIUM 40 MG PO TABS: 40.0000 mg | ORAL_TABLET | Freq: Every day | ORAL | 0 refills | Status: DC

## 2022-09-24 MED ORDER — TAMSULOSIN HCL 0.4 MG PO CAPS
0.4000 mg | ORAL_CAPSULE | Freq: Every day | ORAL | 0 refills | Status: DC
Start: 2022-09-24 — End: 2022-09-24

## 2022-09-24 MED ORDER — DOCUSATE SODIUM 100 MG PO CAPS: 100.0000 mg | ORAL_CAPSULE | Freq: Every day | ORAL | 1 refills | Status: DC | PRN

## 2022-09-24 MED ORDER — LISINOPRIL 2.5 MG PO TABS: 2.5000 mg | ORAL_TABLET | Freq: Every day | ORAL | 0 refills | Status: DC

## 2022-09-24 MED ORDER — TAMSULOSIN HCL 0.4 MG PO CAPS: 0.4000 mg | ORAL_CAPSULE | Freq: Every day | ORAL | 1 refills | Status: DC

## 2022-09-24 NOTE — Progress Notes (Addendum)
Douglas Pruitt,acting as a Neurosurgeon for Douglas Felts, FNP.,have documented all relevant documentation on the behalf of Douglas Felts, FNP,as directed by  Douglas Felts, FNP while in the presence of Douglas Felts, FNP.    Subjective:     Patient ID: Douglas Pruitt , male    DOB: [birth year] , 72 y.o.   MRN: [id number]   Chief Complaint  Patient presents with   Hypertension    HPI  Patient presents today for a f/u on his hypertension and cholesterol.  Patient states compliance with medications. Patient also had a stroke in march of 2023, he was hospital twice since march of 2023. Patient went to rehab and is just being discharged July 29, 2022. He was in rehab almost 8 months. He has left side weakness able to move on left leg and no movement to hands and arms. He is getting OT and PT once a week. His daughter is having to do most things for him, he is able to put his socks on but takes a while, unable to put his shirt on and sometimes able to put boxers on. He can feed himself and able to go to bathroom on his own. He has had 3 falls since being home but no serious injury. No problems with taking his medications.   Occasionally will say random things, will sometimes have to repeat what they have told him.   He has not followed up with [facility]. He was to be home for 2-3 months before they will see him to see the progress.   He has not had his blood pressure medication today. He does eat soft meals and does have drooling but no choking.   Patient presents with his daughter Douglas Pruitt.  BP Readings from Last 3 Encounters: 09/24/22 : (!) 150/86 02/21/22 : (!) 166/89 02/05/22 : 126/75    Hypertension This is a chronic problem. The current episode started more than 1 year ago. The problem is uncontrolled. Pertinent negatives include no anxiety, blurred vision, chest pain, headaches or palpitations. There are no associated agents to hypertension. Risk factors for coronary artery  disease include male gender and sedentary lifestyle. Past treatments include ACE inhibitors. The current treatment provides significant improvement. There are no compliance problems.  There is no history of angina. There is no history of chronic renal disease.     Past Medical History:  Diagnosis Date   CVA (cerebral vascular accident) (HCC) 10/22/2021   Hyperlipidemia    Hypertension      No family history on file.   Current Outpatient Medications:    docusate sodium (COLACE) 100 MG capsule, Take 1 capsule (100 mg total) by mouth daily as needed., Disp: 90 capsule, Rfl: 1   acetaminophen (TYLENOL) 325 MG tablet, Take 650 mg by mouth every 6 (six) hours as needed for moderate pain or headache. (Patient not taking: Reported on 09/24/2022), Disp: , Rfl:    atorvastatin (LIPITOR) 40 MG tablet, Take 1 tablet (40 mg total) by mouth at bedtime., Disp: 30 tablet, Rfl: 0   lisinopril (ZESTRIL) 2.5 MG tablet, Take 1 tablet (2.5 mg total) by mouth daily., Disp: 90 tablet, Rfl: 0   tamsulosin (FLOMAX) 0.4 MG CAPS capsule, Take 1 capsule (0.4 mg total) by mouth daily after supper., Disp: 90 capsule, Rfl: 1   No Known Allergies   Review of Systems  Constitutional: Negative.   Eyes:  Negative for blurred vision.  Respiratory: Negative.    Cardiovascular:  Negative for  chest pain, palpitations and leg swelling.  Musculoskeletal:  Positive for gait problem (now has a limp due to left hemiplegia).  Neurological:  Negative for dizziness and headaches.  Psychiatric/Behavioral: Negative.         Per daughter he is not saying as much as he used too     Today's Vitals   09/24/22 1602 09/24/22 1642  BP: (!) 150/86 138/72  Pulse: 98   Temp: 98.9 F (37.2 C)   TempSrc: Oral   Weight: 134 lb (60.8 kg)   Height: 5\' 2"  (1.575 m)   PainSc: 5    PainLoc: Knee    Body mass index is 24.51 kg/m.  Wt Readings from Last 3 Encounters:  09/24/22 134 lb (60.8 kg)  02/21/22 134 lb (60.8 kg)  11/15/21 147  lb 14.9 oz (67.1 kg)    Objective:  Physical Exam Vitals reviewed.  Constitutional:      General: He is not in acute distress.    Appearance: Normal appearance. He is normal weight.  Cardiovascular:     Rate and Rhythm: Normal rate and regular rhythm.     Pulses: Normal pulses.     Heart sounds: Normal heart sounds.  Pulmonary:     Effort: Pulmonary effort is normal. No respiratory distress.     Breath sounds: Normal breath sounds. No wheezing.  Musculoskeletal:        General: Deformity (hemiplegia upper and lower left extremity) present. No swelling or tenderness. Normal range of motion.     Cervical back: Normal range of motion and neck supple.     Right lower leg: No edema.     Left lower leg: No edema.     Comments: Left upper and lower extremity are flaccid.   Skin:    General: Skin is warm and dry.     Capillary Refill: Capillary refill takes less than 2 seconds.  Neurological:     General: No focal deficit present.     Mental Status: He is alert and oriented to person, place, and time.     Cranial Nerves: No cranial nerve deficit.     Motor: No weakness.  Psychiatric:        Mood and Affect: Mood normal.        Behavior: Behavior normal.        Thought Content: Thought content normal.        Judgment: Judgment normal.         Assessment And Plan:     1. Essential hypertension Comments: Blood pressure is fairly controlled.  Continue current medications. - BMP8+eGFR - lisinopril (ZESTRIL) 2.5 MG tablet; Take 1 tablet (2.5 mg total) by mouth daily.  Dispense: 90 tablet; Refill: 0  2. Mixed hyperlipidemia Comments: Continue statin, tolerating well - Lipid panel - atorvastatin (LIPITOR) 40 MG tablet; Take 1 tablet (40 mg total) by mouth at bedtime.  Dispense: 30 tablet; Refill: 0  3. Need for assistance with personal care Comments: Will order PCS services to help with care at home.   4. Flaccid hemiplegia of left nondominant side as late effect of cerebral  infarction [facility]) Comments: Unable to use upper extremity, he is able to walk but has a limp. Continue with PT  5. Colon cancer screening According to USPTF Colorectal cancer Screening guidelines. Cologuard is recommended every 3 years, starting at age 6 years. Will send Rx for cologuard for colon cancer screening. - Cologuard  6. Current moderate episode of major depressive disorder without prior episode (  HCC) Depression screen score is 14, daughter would like to hold off on medications at this time. I did explain when a person has a stroke there is a high risk for depression due to residual disabilities.    Patient was given opportunity to ask questions. Patient verbalized understanding of the plan and was able to repeat key elements of the plan. All questions were answered to their satisfaction.  Douglas Felts, FNP   I, Douglas Felts, FNP, have reviewed all documentation for this visit. The documentation on 09/24/22 for the exam, diagnosis, procedures, and orders are all accurate and complete.   IF YOU HAVE BEEN REFERRED TO A SPECIALIST, IT MAY TAKE 1-2 WEEKS TO SCHEDULE/PROCESS THE REFERRAL. IF YOU HAVE NOT HEARD FROM US/SPECIALIST IN TWO WEEKS, PLEASE GIVE [place] A CALL AT [phone] X [phone].   THE PATIENT IS ENCOURAGED TO PRACTICE SOCIAL DISTANCING DUE TO THE COVID-19 PANDEMIC.

## 2022-09-24 NOTE — Patient Instructions (Signed)
Hypertension, Adult ?Hypertension is another name for high blood pressure. High blood pressure forces your heart to work harder to pump blood. This can cause problems over time. ?There are two numbers in a blood pressure reading. There is a top number (systolic) over a bottom number (diastolic). It is best to have a blood pressure that is below 120/80. ?What are the causes? ?The cause of this condition is not known. Some other conditions can lead to high blood pressure. ?What increases the risk? ?Some lifestyle factors can make you more likely to develop high blood pressure: ?Smoking. ?Not getting enough exercise or physical activity. ?Being overweight. ?Having too much fat, sugar, calories, or salt (sodium) in your diet. ?Drinking too much alcohol. ?Other risk factors include: ?Having any of these conditions: ?Heart disease. ?Diabetes. ?High cholesterol. ?Kidney disease. ?Obstructive sleep apnea. ?Having a family history of high blood pressure and high cholesterol. ?Age. The risk increases with age. ?Stress. ?What are the signs or symptoms? ?High blood pressure may not cause symptoms. Very high blood pressure (hypertensive crisis) may cause: ?Headache. ?Fast or uneven heartbeats (palpitations). ?Shortness of breath. ?Nosebleed. ?Vomiting or feeling like you may vomit (nauseous). ?Changes in how you see. ?Very bad chest pain. ?Feeling dizzy. ?Seizures. ?How is this treated? ?This condition is treated by making healthy lifestyle changes, such as: ?Eating healthy foods. ?Exercising more. ?Drinking less alcohol. ?Your doctor may prescribe medicine if lifestyle changes do not help enough and if: ?Your top number is above 130. ?Your bottom number is above 80. ?Your personal target blood pressure may vary. ?Follow these instructions at home: ?Eating and drinking ? ?If told, follow the DASH eating plan. To follow this plan: ?Fill one half of your plate at each meal with fruits and vegetables. ?Fill one fourth of your plate  at each meal with whole grains. Whole grains include whole-wheat pasta, brown rice, and whole-grain bread. ?Eat or drink low-fat dairy products, such as skim milk or low-fat yogurt. ?Fill one fourth of your plate at each meal with low-fat (lean) proteins. Low-fat proteins include fish, chicken without skin, eggs, beans, and tofu. ?Avoid fatty meat, cured and processed meat, or chicken with skin. ?Avoid pre-made or processed food. ?Limit the amount of salt in your diet to less than 1,500 mg each day. ?Do not drink alcohol if: ?Your doctor tells you not to drink. ?You are pregnant, may be pregnant, or are planning to become pregnant. ?If you drink alcohol: ?Limit how much you have to: ?0-1 drink a day for women. ?0-2 drinks a day for men. ?Know how much alcohol is in your drink. In the [place] one drink equals one 12 oz bottle of beer (355 mL), one 5 oz glass of wine (148 mL), or one 1? oz glass of hard liquor (44 mL). ?Lifestyle ? ?Work with your doctor to stay at a healthy weight or to lose weight. Ask your doctor what the best weight is for you. ?Get at least 30 minutes of exercise that causes your heart to beat faster (aerobic exercise) most days of the week. This may include walking, swimming, or biking. ?Get at least 30 minutes of exercise that strengthens your muscles (resistance exercise) at least 3 days a week. This may include lifting weights or doing Pilates. ?Do not smoke or use any products that contain nicotine or tobacco. If you need help quitting, ask your doctor. ?Check your blood pressure at home as told by your doctor. ?Keep all follow-up visits. ?Medicines ?Take over-the-counter and prescription medicines   only as told by your doctor. Follow directions carefully. ?Do not skip doses of blood pressure medicine. The medicine does not work as well if you skip doses. Skipping doses also puts you at risk for problems. ?Ask your doctor about side effects or reactions to medicines that you should watch  for. ?Contact a doctor if: ?You think you are having a reaction to the medicine you are taking. ?You have headaches that keep coming back. ?You feel dizzy. ?You have swelling in your ankles. ?You have trouble with your vision. ?Get help right away if: ?You get a very bad headache. ?You start to feel mixed up (confused). ?You feel weak or numb. ?You feel faint. ?You have very bad pain in your: ?Chest. ?Belly (abdomen). ?You vomit more than once. ?You have trouble breathing. ?These symptoms may be [place] emergency. Get help right away. Call 911. ?Do not wait to see if the symptoms will go away. ?Do not drive yourself to the hospital. ?Summary ?Hypertension is another name for high blood pressure. ?High blood pressure forces your heart to work harder to pump blood. ?For most people, a normal blood pressure is less than 120/80. ?Making healthy choices can help lower blood pressure. If your blood pressure does not get lower with healthy choices, you may need to take medicine. ?This information is not intended to replace advice given to you by your health care provider. Make sure you discuss any questions you have with your health care provider. ?Document Revised: 05/03/2021 Document Reviewed: 05/03/2021 ?Elsevier Patient Education ? 2023 Elsevier Inc. ? ?

## 2022-09-25 LAB — BMP8+EGFR
BUN/Creatinine Ratio: 19 (ref 10–24)
BUN: 14 mg/dL (ref 8–27)
CO2: 24 mmol/L (ref 20–29)
Calcium: 9.1 mg/dL (ref 8.6–10.2)
Chloride: 103 mmol/L (ref 96–106)
Creatinine, Ser: 0.74 mg/dL — ABNORMAL LOW (ref 0.76–1.27)
Glucose: 97 mg/dL (ref 70–99)
Potassium: 4.1 mmol/L (ref 3.5–5.2)
Sodium: 141 mmol/L (ref 134–144)
eGFR: 96 mL/min/{1.73_m2} (ref >59)

## 2022-09-25 LAB — LIPID PANEL
Chol/HDL Ratio: 3.1 ratio (ref 0.0–5.0)
Cholesterol, Total: 122 mg/dL (ref 100–199)
HDL: 40 mg/dL (ref >39)
LDL Chol Calc (NIH): 63 mg/dL (ref 0–99)
Triglycerides: 103 mg/dL (ref 0–149)
VLDL Cholesterol Cal: 19 mg/dL (ref 5–40)

## 2022-09-26 DIAGNOSIS — R1310 Dysphagia, unspecified: Secondary | ICD-10-CM | POA: Diagnosis not present

## 2022-09-26 DIAGNOSIS — G4700 Insomnia, unspecified: Secondary | ICD-10-CM | POA: Diagnosis not present

## 2022-09-26 DIAGNOSIS — E785 Hyperlipidemia, unspecified: Secondary | ICD-10-CM | POA: Diagnosis not present

## 2022-09-26 DIAGNOSIS — I69354 Hemiplegia and hemiparesis following cerebral infarction affecting left non-dominant side: Secondary | ICD-10-CM | POA: Diagnosis not present

## 2022-09-26 DIAGNOSIS — M76892 Other specified enthesopathies of left lower limb, excluding foot: Secondary | ICD-10-CM | POA: Diagnosis not present

## 2022-09-26 DIAGNOSIS — I10 Essential (primary) hypertension: Secondary | ICD-10-CM | POA: Diagnosis not present

## 2022-09-30 ENCOUNTER — Telehealth: Payer: Self-pay

## 2022-09-30 DIAGNOSIS — R1310 Dysphagia, unspecified: Secondary | ICD-10-CM | POA: Diagnosis not present

## 2022-09-30 DIAGNOSIS — M76892 Other specified enthesopathies of left lower limb, excluding foot: Secondary | ICD-10-CM | POA: Diagnosis not present

## 2022-09-30 DIAGNOSIS — E785 Hyperlipidemia, unspecified: Secondary | ICD-10-CM | POA: Diagnosis not present

## 2022-09-30 DIAGNOSIS — G4700 Insomnia, unspecified: Secondary | ICD-10-CM | POA: Diagnosis not present

## 2022-09-30 DIAGNOSIS — I69354 Hemiplegia and hemiparesis following cerebral infarction affecting left non-dominant side: Secondary | ICD-10-CM | POA: Diagnosis not present

## 2022-09-30 DIAGNOSIS — I10 Essential (primary) hypertension: Secondary | ICD-10-CM | POA: Diagnosis not present

## 2022-09-30 NOTE — Telephone Encounter (Signed)
Kimberly w/[facility] called to request verbal orders for OT 2x3 and 1x4.  Please advise, thanks!

## 2022-09-30 NOTE — Telephone Encounter (Signed)
Verbal orders approved.

## 2022-09-30 NOTE — Telephone Encounter (Signed)
Spoke with Maudie Mercury and advised of approval. Nothing further needed at this time.

## 2022-10-01 ENCOUNTER — Telehealth: Payer: Self-pay

## 2022-10-01 DIAGNOSIS — Z8673 Personal history of transient ischemic attack (TIA), and cerebral infarction without residual deficits: Secondary | ICD-10-CM

## 2022-10-01 DIAGNOSIS — I69354 Hemiplegia and hemiparesis following cerebral infarction affecting left non-dominant side: Secondary | ICD-10-CM | POA: Diagnosis not present

## 2022-10-01 DIAGNOSIS — G4700 Insomnia, unspecified: Secondary | ICD-10-CM | POA: Diagnosis not present

## 2022-10-01 DIAGNOSIS — M76892 Other specified enthesopathies of left lower limb, excluding foot: Secondary | ICD-10-CM | POA: Diagnosis not present

## 2022-10-01 DIAGNOSIS — R1310 Dysphagia, unspecified: Secondary | ICD-10-CM | POA: Diagnosis not present

## 2022-10-01 DIAGNOSIS — Z741 Need for assistance with personal care: Secondary | ICD-10-CM

## 2022-10-01 DIAGNOSIS — I10 Essential (primary) hypertension: Secondary | ICD-10-CM | POA: Diagnosis not present

## 2022-10-01 DIAGNOSIS — E785 Hyperlipidemia, unspecified: Secondary | ICD-10-CM | POA: Diagnosis not present

## 2022-10-01 NOTE — Telephone Encounter (Signed)
Patient's daughter called to state [facility] PT has requested patient start Baclofen, Gabapentin and Linzess.  None of these are on current med list.    Please advise, thank you!

## 2022-10-03 NOTE — Telephone Encounter (Signed)
Was he on these medications at the nursing home?

## 2022-10-04 DIAGNOSIS — K5901 Slow transit constipation: Secondary | ICD-10-CM | POA: Diagnosis not present

## 2022-10-04 DIAGNOSIS — Z9181 History of falling: Secondary | ICD-10-CM | POA: Diagnosis not present

## 2022-10-04 DIAGNOSIS — E785 Hyperlipidemia, unspecified: Secondary | ICD-10-CM | POA: Diagnosis not present

## 2022-10-04 DIAGNOSIS — R1310 Dysphagia, unspecified: Secondary | ICD-10-CM | POA: Diagnosis not present

## 2022-10-04 DIAGNOSIS — G4700 Insomnia, unspecified: Secondary | ICD-10-CM | POA: Diagnosis not present

## 2022-10-04 DIAGNOSIS — M76892 Other specified enthesopathies of left lower limb, excluding foot: Secondary | ICD-10-CM | POA: Diagnosis not present

## 2022-10-04 DIAGNOSIS — S0592XD Unspecified injury of left eye and orbit, subsequent encounter: Secondary | ICD-10-CM | POA: Diagnosis not present

## 2022-10-04 DIAGNOSIS — I779 Disorder of arteries and arterioles, unspecified: Secondary | ICD-10-CM | POA: Diagnosis not present

## 2022-10-04 DIAGNOSIS — I10 Essential (primary) hypertension: Secondary | ICD-10-CM | POA: Diagnosis not present

## 2022-10-04 DIAGNOSIS — I69354 Hemiplegia and hemiparesis following cerebral infarction affecting left non-dominant side: Secondary | ICD-10-CM | POA: Diagnosis not present

## 2022-10-04 DIAGNOSIS — Z7982 Long term (current) use of aspirin: Secondary | ICD-10-CM | POA: Diagnosis not present

## 2022-10-07 ENCOUNTER — Encounter: Payer: Self-pay | Admitting: Nurse Practitioner

## 2022-10-07 DIAGNOSIS — E785 Hyperlipidemia, unspecified: Secondary | ICD-10-CM | POA: Diagnosis not present

## 2022-10-07 DIAGNOSIS — M76892 Other specified enthesopathies of left lower limb, excluding foot: Secondary | ICD-10-CM | POA: Diagnosis not present

## 2022-10-07 DIAGNOSIS — I69354 Hemiplegia and hemiparesis following cerebral infarction affecting left non-dominant side: Secondary | ICD-10-CM | POA: Diagnosis not present

## 2022-10-07 DIAGNOSIS — I10 Essential (primary) hypertension: Secondary | ICD-10-CM | POA: Diagnosis not present

## 2022-10-07 DIAGNOSIS — R1310 Dysphagia, unspecified: Secondary | ICD-10-CM | POA: Diagnosis not present

## 2022-10-07 DIAGNOSIS — G4700 Insomnia, unspecified: Secondary | ICD-10-CM | POA: Diagnosis not present

## 2022-10-08 DIAGNOSIS — E785 Hyperlipidemia, unspecified: Secondary | ICD-10-CM | POA: Diagnosis not present

## 2022-10-08 DIAGNOSIS — R1310 Dysphagia, unspecified: Secondary | ICD-10-CM | POA: Diagnosis not present

## 2022-10-08 DIAGNOSIS — I10 Essential (primary) hypertension: Secondary | ICD-10-CM | POA: Diagnosis not present

## 2022-10-08 DIAGNOSIS — G4700 Insomnia, unspecified: Secondary | ICD-10-CM | POA: Diagnosis not present

## 2022-10-08 DIAGNOSIS — I69354 Hemiplegia and hemiparesis following cerebral infarction affecting left non-dominant side: Secondary | ICD-10-CM | POA: Diagnosis not present

## 2022-10-08 DIAGNOSIS — M76892 Other specified enthesopathies of left lower limb, excluding foot: Secondary | ICD-10-CM | POA: Diagnosis not present

## 2022-10-08 NOTE — Telephone Encounter (Signed)
Per patient's daughter, Douglas Pruitt, patient was on these medications at nursing home but they did not send him any home with him. She is not sure of doses, has this information at home and will either call it in or send via [facility].  She also requests referral for skilled nursing services, would like to continue with [facility].  Please advise, thank you!

## 2022-10-08 NOTE — Telephone Encounter (Signed)
I have placed Chick order for [facility] services.

## 2022-10-09 ENCOUNTER — Other Ambulatory Visit: Payer: Self-pay

## 2022-10-11 DIAGNOSIS — R1310 Dysphagia, unspecified: Secondary | ICD-10-CM | POA: Diagnosis not present

## 2022-10-11 DIAGNOSIS — G4700 Insomnia, unspecified: Secondary | ICD-10-CM | POA: Diagnosis not present

## 2022-10-11 DIAGNOSIS — I10 Essential (primary) hypertension: Secondary | ICD-10-CM | POA: Diagnosis not present

## 2022-10-11 DIAGNOSIS — I69354 Hemiplegia and hemiparesis following cerebral infarction affecting left non-dominant side: Secondary | ICD-10-CM | POA: Diagnosis not present

## 2022-10-11 DIAGNOSIS — M76892 Other specified enthesopathies of left lower limb, excluding foot: Secondary | ICD-10-CM | POA: Diagnosis not present

## 2022-10-11 DIAGNOSIS — E785 Hyperlipidemia, unspecified: Secondary | ICD-10-CM | POA: Diagnosis not present

## 2022-10-14 DIAGNOSIS — I69354 Hemiplegia and hemiparesis following cerebral infarction affecting left non-dominant side: Secondary | ICD-10-CM | POA: Diagnosis not present

## 2022-10-14 DIAGNOSIS — G4700 Insomnia, unspecified: Secondary | ICD-10-CM | POA: Diagnosis not present

## 2022-10-14 DIAGNOSIS — M76892 Other specified enthesopathies of left lower limb, excluding foot: Secondary | ICD-10-CM | POA: Diagnosis not present

## 2022-10-14 DIAGNOSIS — R1310 Dysphagia, unspecified: Secondary | ICD-10-CM | POA: Diagnosis not present

## 2022-10-14 DIAGNOSIS — E785 Hyperlipidemia, unspecified: Secondary | ICD-10-CM | POA: Diagnosis not present

## 2022-10-14 DIAGNOSIS — I10 Essential (primary) hypertension: Secondary | ICD-10-CM | POA: Diagnosis not present

## 2022-10-15 DIAGNOSIS — M76892 Other specified enthesopathies of left lower limb, excluding foot: Secondary | ICD-10-CM | POA: Diagnosis not present

## 2022-10-15 DIAGNOSIS — G4700 Insomnia, unspecified: Secondary | ICD-10-CM | POA: Diagnosis not present

## 2022-10-15 DIAGNOSIS — E785 Hyperlipidemia, unspecified: Secondary | ICD-10-CM | POA: Diagnosis not present

## 2022-10-15 DIAGNOSIS — I10 Essential (primary) hypertension: Secondary | ICD-10-CM | POA: Diagnosis not present

## 2022-10-15 DIAGNOSIS — R1310 Dysphagia, unspecified: Secondary | ICD-10-CM | POA: Diagnosis not present

## 2022-10-15 DIAGNOSIS — I69354 Hemiplegia and hemiparesis following cerebral infarction affecting left non-dominant side: Secondary | ICD-10-CM | POA: Diagnosis not present

## 2022-10-16 DIAGNOSIS — G4700 Insomnia, unspecified: Secondary | ICD-10-CM | POA: Diagnosis not present

## 2022-10-16 DIAGNOSIS — I69354 Hemiplegia and hemiparesis following cerebral infarction affecting left non-dominant side: Secondary | ICD-10-CM | POA: Diagnosis not present

## 2022-10-16 DIAGNOSIS — E785 Hyperlipidemia, unspecified: Secondary | ICD-10-CM | POA: Diagnosis not present

## 2022-10-16 DIAGNOSIS — R1310 Dysphagia, unspecified: Secondary | ICD-10-CM | POA: Diagnosis not present

## 2022-10-16 DIAGNOSIS — M76892 Other specified enthesopathies of left lower limb, excluding foot: Secondary | ICD-10-CM | POA: Diagnosis not present

## 2022-10-16 DIAGNOSIS — I10 Essential (primary) hypertension: Secondary | ICD-10-CM | POA: Diagnosis not present

## 2022-10-16 NOTE — Addendum Note (Signed)
Addended by: Wadie Lessen on: 10/16/2022 03:35 PM   Modules accepted: Orders

## 2022-10-16 NOTE — Telephone Encounter (Signed)
Per daughter he has been taking the following:  Baclofen 10mg  1tab po QAM                   5mg  1/2tab po QHS  Gabapentin 100mg  2tabs po QAM                     300mg  1tab po QHS/9pm  Linzess  257mcg 1tab QD  [facility]  Thank you!

## 2022-10-21 DIAGNOSIS — E785 Hyperlipidemia, unspecified: Secondary | ICD-10-CM | POA: Diagnosis not present

## 2022-10-21 DIAGNOSIS — I10 Essential (primary) hypertension: Secondary | ICD-10-CM | POA: Diagnosis not present

## 2022-10-21 DIAGNOSIS — R1310 Dysphagia, unspecified: Secondary | ICD-10-CM | POA: Diagnosis not present

## 2022-10-21 DIAGNOSIS — G4700 Insomnia, unspecified: Secondary | ICD-10-CM | POA: Diagnosis not present

## 2022-10-21 DIAGNOSIS — M76892 Other specified enthesopathies of left lower limb, excluding foot: Secondary | ICD-10-CM | POA: Diagnosis not present

## 2022-10-21 DIAGNOSIS — I69354 Hemiplegia and hemiparesis following cerebral infarction affecting left non-dominant side: Secondary | ICD-10-CM | POA: Diagnosis not present

## 2022-10-24 ENCOUNTER — Other Ambulatory Visit: Payer: Self-pay | Admitting: Nurse Practitioner

## 2022-10-24 MED ORDER — BACLOFEN 10 MG PO TABS: 10.0000 mg | ORAL_TABLET | Freq: Two times a day (BID) | ORAL | 2 refills | Status: DC

## 2022-10-28 DIAGNOSIS — R1310 Dysphagia, unspecified: Secondary | ICD-10-CM | POA: Diagnosis not present

## 2022-10-28 DIAGNOSIS — E785 Hyperlipidemia, unspecified: Secondary | ICD-10-CM | POA: Diagnosis not present

## 2022-10-28 DIAGNOSIS — I10 Essential (primary) hypertension: Secondary | ICD-10-CM | POA: Diagnosis not present

## 2022-10-28 DIAGNOSIS — G4700 Insomnia, unspecified: Secondary | ICD-10-CM | POA: Diagnosis not present

## 2022-10-28 DIAGNOSIS — M76892 Other specified enthesopathies of left lower limb, excluding foot: Secondary | ICD-10-CM | POA: Diagnosis not present

## 2022-10-28 DIAGNOSIS — I69354 Hemiplegia and hemiparesis following cerebral infarction affecting left non-dominant side: Secondary | ICD-10-CM | POA: Diagnosis not present

## 2022-10-29 ENCOUNTER — Other Ambulatory Visit: Payer: Self-pay | Admitting: Nurse Practitioner

## 2022-10-29 DIAGNOSIS — E782 Mixed hyperlipidemia: Secondary | ICD-10-CM

## 2022-10-30 DIAGNOSIS — I10 Essential (primary) hypertension: Secondary | ICD-10-CM | POA: Diagnosis not present

## 2022-10-30 DIAGNOSIS — M76892 Other specified enthesopathies of left lower limb, excluding foot: Secondary | ICD-10-CM | POA: Diagnosis not present

## 2022-10-30 DIAGNOSIS — I69354 Hemiplegia and hemiparesis following cerebral infarction affecting left non-dominant side: Secondary | ICD-10-CM | POA: Diagnosis not present

## 2022-10-30 DIAGNOSIS — R1310 Dysphagia, unspecified: Secondary | ICD-10-CM | POA: Diagnosis not present

## 2022-10-30 DIAGNOSIS — E785 Hyperlipidemia, unspecified: Secondary | ICD-10-CM | POA: Diagnosis not present

## 2022-10-30 DIAGNOSIS — G4700 Insomnia, unspecified: Secondary | ICD-10-CM | POA: Diagnosis not present

## 2022-11-03 DIAGNOSIS — S0592XD Unspecified injury of left eye and orbit, subsequent encounter: Secondary | ICD-10-CM | POA: Diagnosis not present

## 2022-11-03 DIAGNOSIS — M76892 Other specified enthesopathies of left lower limb, excluding foot: Secondary | ICD-10-CM | POA: Diagnosis not present

## 2022-11-03 DIAGNOSIS — I69354 Hemiplegia and hemiparesis following cerebral infarction affecting left non-dominant side: Secondary | ICD-10-CM | POA: Diagnosis not present

## 2022-11-03 DIAGNOSIS — R1310 Dysphagia, unspecified: Secondary | ICD-10-CM | POA: Diagnosis not present

## 2022-11-03 DIAGNOSIS — K5901 Slow transit constipation: Secondary | ICD-10-CM | POA: Diagnosis not present

## 2022-11-03 DIAGNOSIS — G4700 Insomnia, unspecified: Secondary | ICD-10-CM | POA: Diagnosis not present

## 2022-11-03 DIAGNOSIS — I10 Essential (primary) hypertension: Secondary | ICD-10-CM | POA: Diagnosis not present

## 2022-11-03 DIAGNOSIS — E785 Hyperlipidemia, unspecified: Secondary | ICD-10-CM | POA: Diagnosis not present

## 2022-11-03 DIAGNOSIS — Z9181 History of falling: Secondary | ICD-10-CM | POA: Diagnosis not present

## 2022-11-03 DIAGNOSIS — I779 Disorder of arteries and arterioles, unspecified: Secondary | ICD-10-CM | POA: Diagnosis not present

## 2022-11-03 DIAGNOSIS — Z7982 Long term (current) use of aspirin: Secondary | ICD-10-CM | POA: Diagnosis not present

## 2022-11-04 ENCOUNTER — Other Ambulatory Visit: Payer: Self-pay

## 2022-11-04 DIAGNOSIS — M25512 Pain in left shoulder: Secondary | ICD-10-CM | POA: Diagnosis not present

## 2022-11-04 DIAGNOSIS — M76892 Other specified enthesopathies of left lower limb, excluding foot: Secondary | ICD-10-CM | POA: Diagnosis not present

## 2022-11-04 DIAGNOSIS — E785 Hyperlipidemia, unspecified: Secondary | ICD-10-CM | POA: Diagnosis not present

## 2022-11-04 DIAGNOSIS — I69354 Hemiplegia and hemiparesis following cerebral infarction affecting left non-dominant side: Secondary | ICD-10-CM | POA: Diagnosis not present

## 2022-11-04 DIAGNOSIS — I10 Essential (primary) hypertension: Secondary | ICD-10-CM | POA: Diagnosis not present

## 2022-11-04 DIAGNOSIS — R1310 Dysphagia, unspecified: Secondary | ICD-10-CM | POA: Diagnosis not present

## 2022-11-04 DIAGNOSIS — M25522 Pain in left elbow: Secondary | ICD-10-CM | POA: Diagnosis not present

## 2022-11-04 DIAGNOSIS — G4700 Insomnia, unspecified: Secondary | ICD-10-CM | POA: Diagnosis not present

## 2022-11-04 MED ORDER — GABAPENTIN 300 MG PO CAPS: 300.0000 mg | ORAL_CAPSULE | Freq: Every day | ORAL | 1 refills | Status: DC

## 2022-11-04 MED ORDER — ASPIRIN 81 MG PO TBEC: 81.0000 mg | DELAYED_RELEASE_TABLET | Freq: Every day | ORAL | 1 refills | Status: DC

## 2022-11-08 ENCOUNTER — Telehealth: Payer: Self-pay | Admitting: Nurse Practitioner

## 2022-11-08 NOTE — Telephone Encounter (Signed)
Called patient to schedule Medicare Annual Wellness Visit (AWV). Left message for patient to call back and schedule Medicare Annual Wellness Visit (AWV).  Last date of AWV: 09/21/20  Please schedule Ruffin appointment at any time with [facility].  If any questions, please contact me at [phone].  Thank you ,  [name] AWV direct phone # [phone]

## 2022-11-11 NOTE — Telephone Encounter (Signed)
Patient's daughter, Douglas Pruitt, is returning call to schedule AWV. Please contact her to schedule.

## 2022-11-12 DIAGNOSIS — M25512 Pain in left shoulder: Secondary | ICD-10-CM | POA: Diagnosis not present

## 2022-11-12 DIAGNOSIS — M25522 Pain in left elbow: Secondary | ICD-10-CM | POA: Diagnosis not present

## 2022-11-17 IMAGING — CR DG HIP (WITH OR WITHOUT PELVIS) 2-3V*L*
3 series · 3 of 3 positions shown · non-contrast
Comparison: [DATE]

CLINICAL DATA: Fall, hip pain

EXAM:
DG HIP (WITH OR WITHOUT PELVIS) 2-3V LEFT

[pelvis ap]
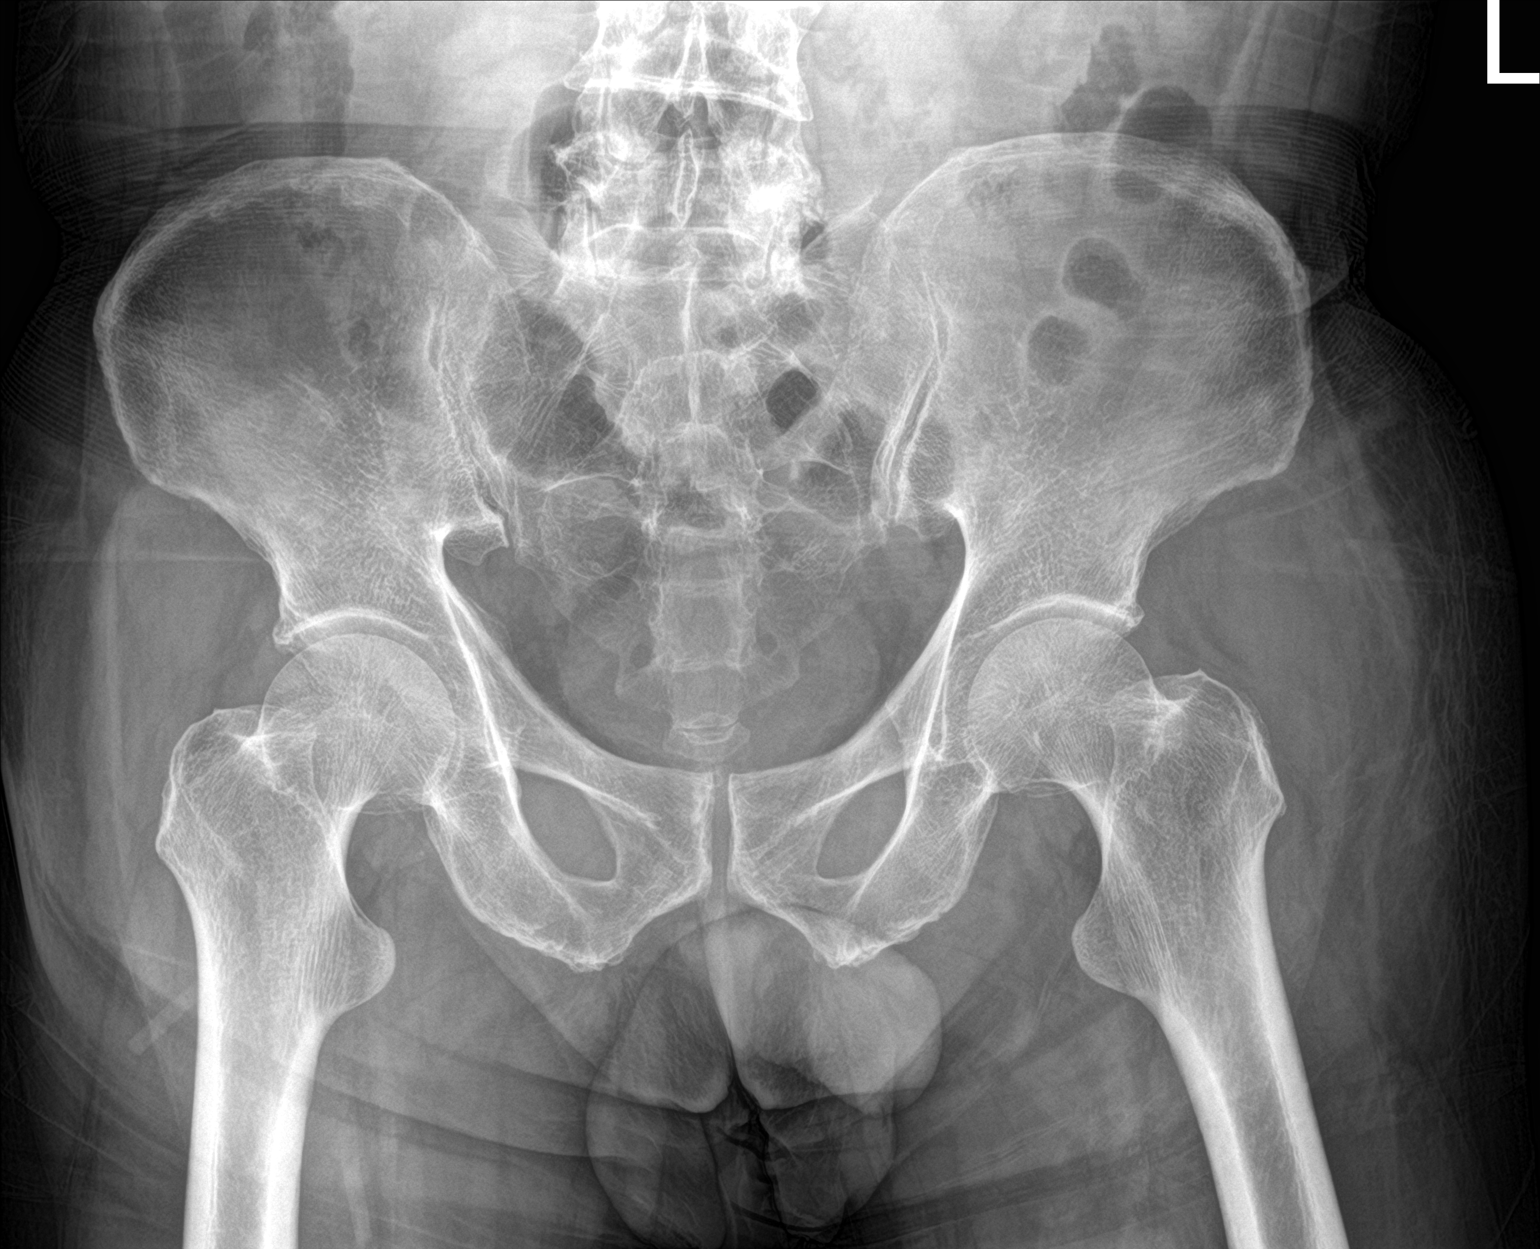

[hip ap]
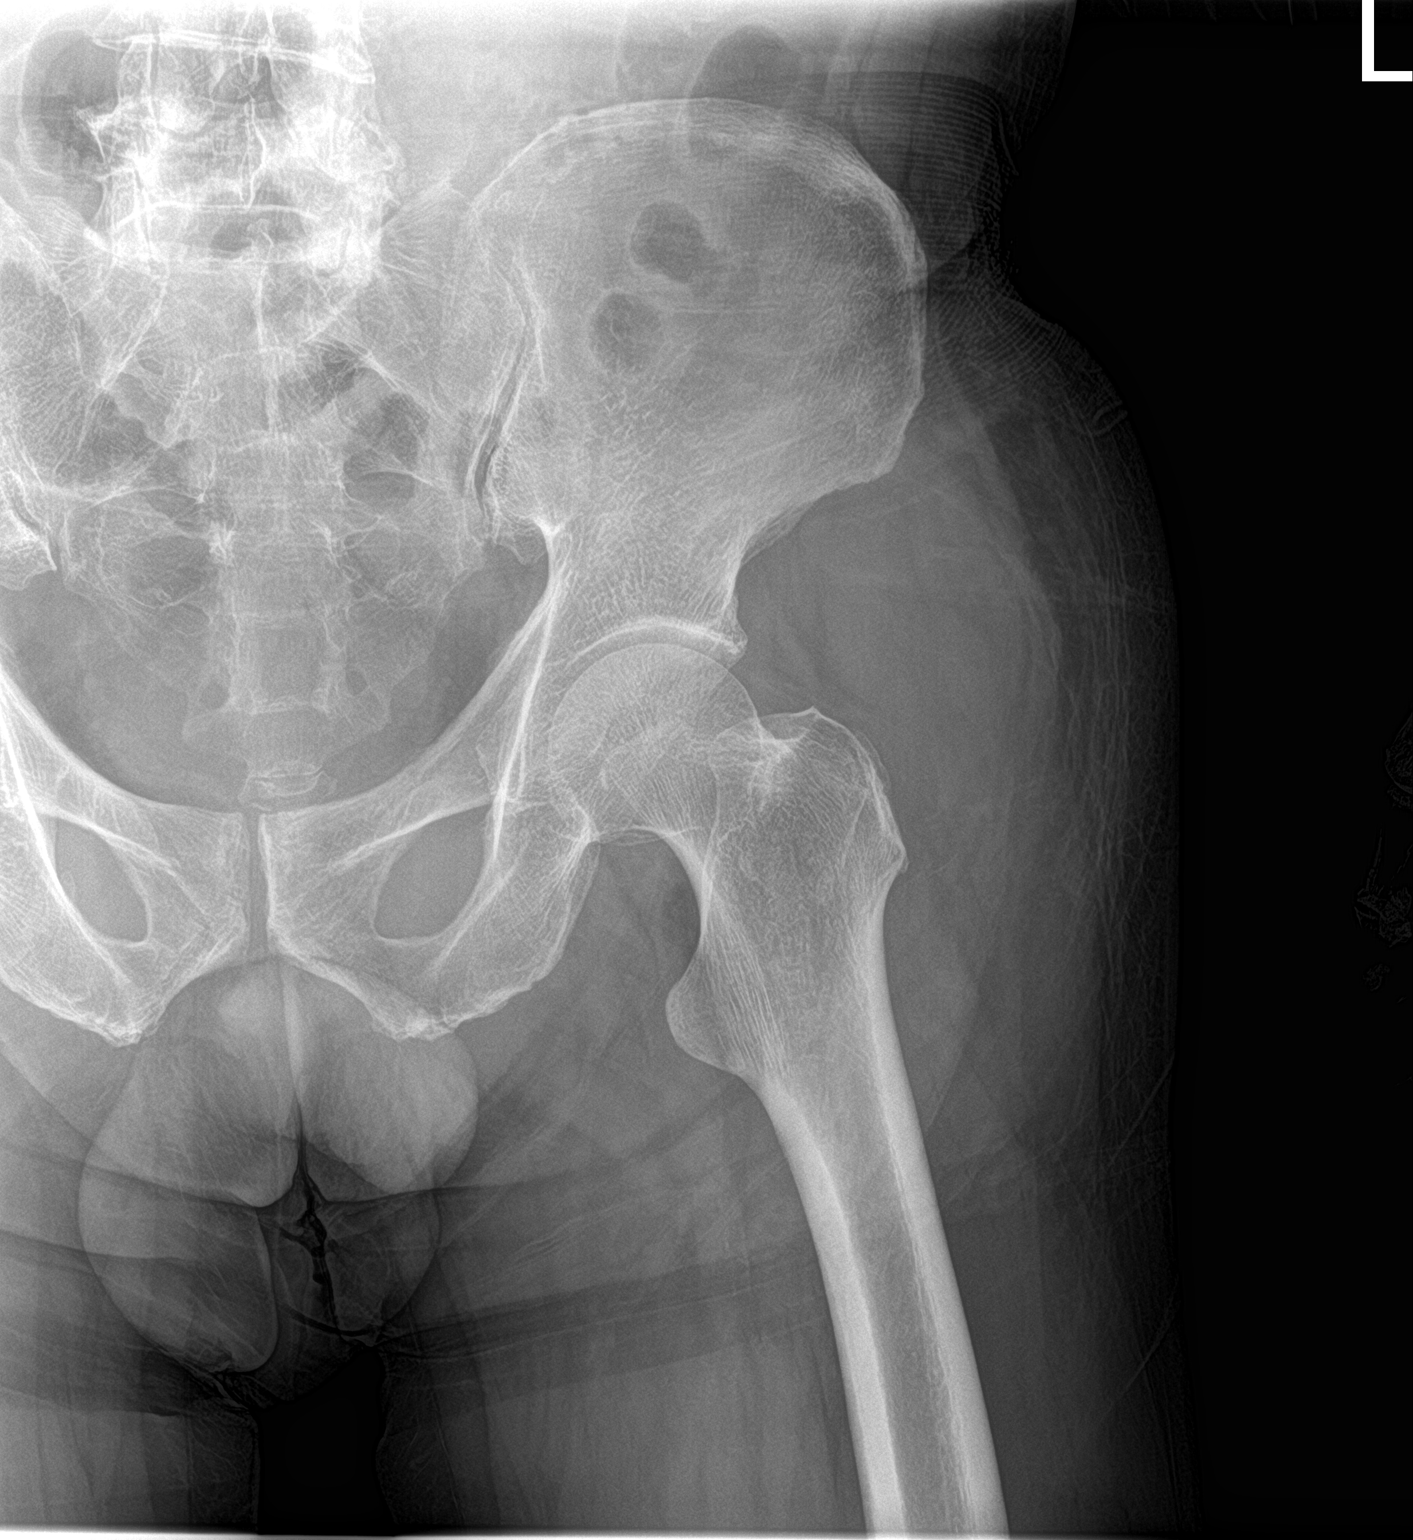

[hip lat]
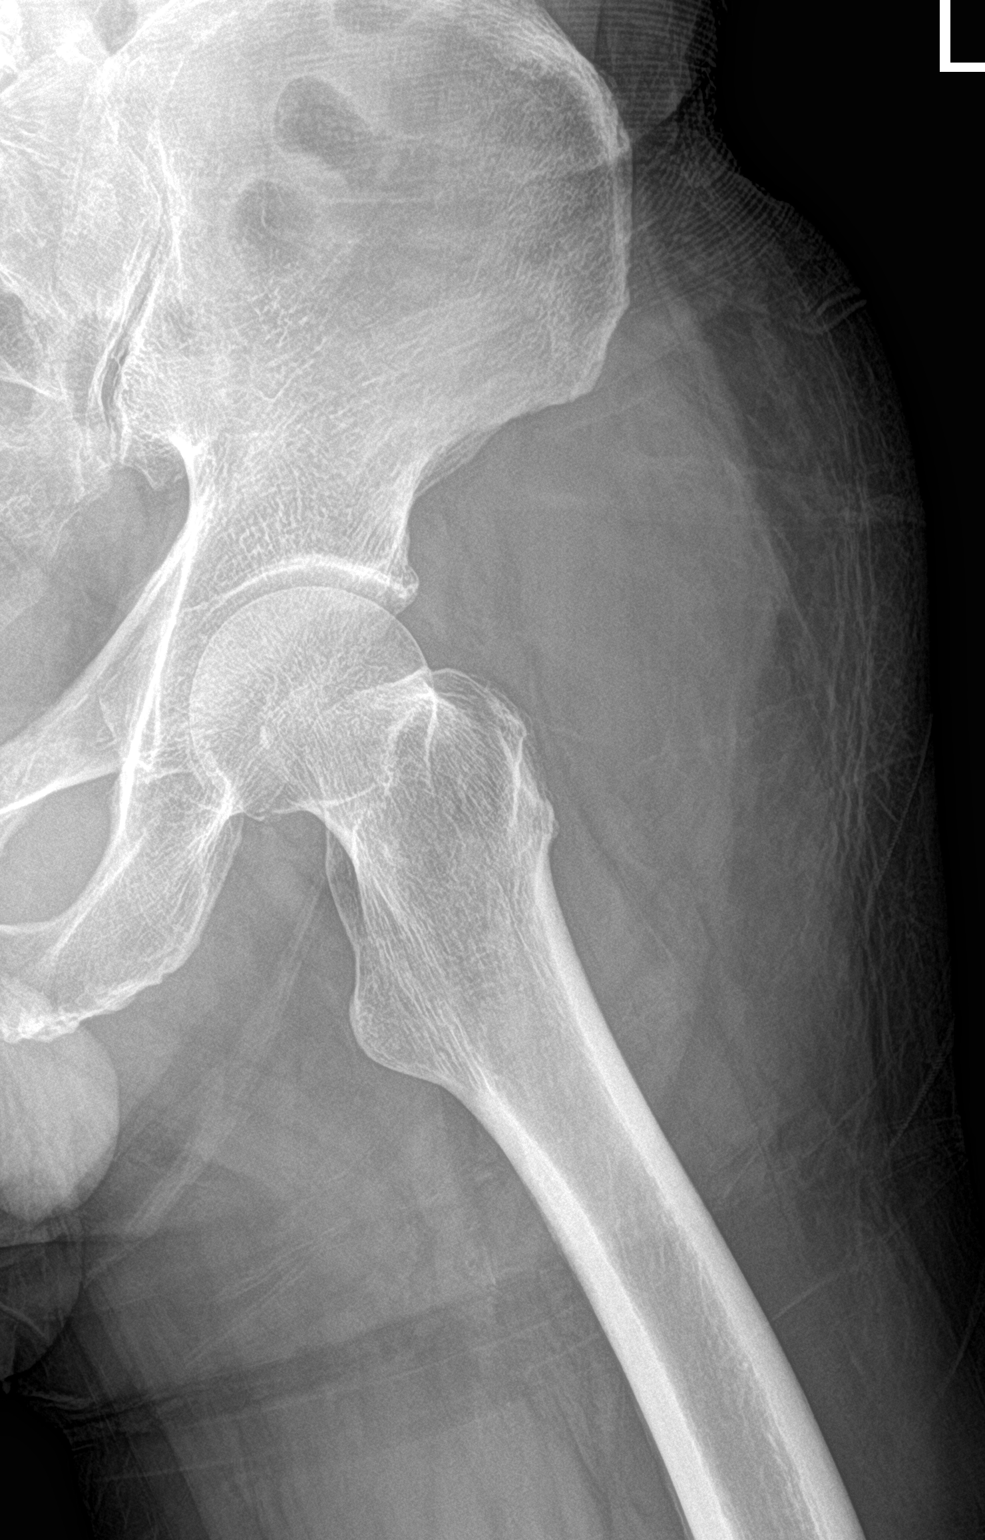

[3 of 3 positions shown; findings below may reference images not displayed]

FINDINGS: There is no evidence of hip fracture or dislocation. There is no
evidence of arthropathy or other focal bone abnormality.
IMPRESSION: Negative.

## 2022-11-18 DIAGNOSIS — E785 Hyperlipidemia, unspecified: Secondary | ICD-10-CM | POA: Diagnosis not present

## 2022-11-18 DIAGNOSIS — M76892 Other specified enthesopathies of left lower limb, excluding foot: Secondary | ICD-10-CM | POA: Diagnosis not present

## 2022-11-18 DIAGNOSIS — I10 Essential (primary) hypertension: Secondary | ICD-10-CM | POA: Diagnosis not present

## 2022-11-18 DIAGNOSIS — G4700 Insomnia, unspecified: Secondary | ICD-10-CM | POA: Diagnosis not present

## 2022-11-18 DIAGNOSIS — R1310 Dysphagia, unspecified: Secondary | ICD-10-CM | POA: Diagnosis not present

## 2022-11-18 DIAGNOSIS — I69354 Hemiplegia and hemiparesis following cerebral infarction affecting left non-dominant side: Secondary | ICD-10-CM | POA: Diagnosis not present

## 2022-11-20 NOTE — Telephone Encounter (Signed)
Contacted Sly Badgett to schedule their annual wellness visit. Appointment made for 11/27/22.  [name] AWV direct phone # [phone]

## 2022-11-25 DIAGNOSIS — E785 Hyperlipidemia, unspecified: Secondary | ICD-10-CM | POA: Diagnosis not present

## 2022-11-25 DIAGNOSIS — I10 Essential (primary) hypertension: Secondary | ICD-10-CM | POA: Diagnosis not present

## 2022-11-25 DIAGNOSIS — G4700 Insomnia, unspecified: Secondary | ICD-10-CM | POA: Diagnosis not present

## 2022-11-25 DIAGNOSIS — I69354 Hemiplegia and hemiparesis following cerebral infarction affecting left non-dominant side: Secondary | ICD-10-CM | POA: Diagnosis not present

## 2022-11-25 DIAGNOSIS — M76892 Other specified enthesopathies of left lower limb, excluding foot: Secondary | ICD-10-CM | POA: Diagnosis not present

## 2022-11-25 DIAGNOSIS — R1310 Dysphagia, unspecified: Secondary | ICD-10-CM | POA: Diagnosis not present

## 2022-11-26 ENCOUNTER — Other Ambulatory Visit: Payer: Self-pay | Admitting: Nurse Practitioner

## 2022-11-27 ENCOUNTER — Ambulatory Visit (INDEPENDENT_AMBULATORY_CARE_PROVIDER_SITE_OTHER): Payer: Medicare Other

## 2022-11-27 VITALS — Ht 65.0 in | Wt 130.0 lb

## 2022-11-27 DIAGNOSIS — Z0000 Encounter for general adult medical examination without abnormal findings: Secondary | ICD-10-CM

## 2022-11-27 NOTE — Progress Notes (Signed)
I connected with  Douglas Pruitt on 11/27/22 by a audio enabled telemedicine application and verified that I am speaking with the correct person using two identifiers. Daughter Douglas Pruitt was also on the call.  Patient Location: Home  Provider Location: Office/Clinic  I discussed the limitations of evaluation and management by telemedicine. The patient expressed understanding and agreed to proceed.  Subjective:   Douglas Pruitt is a 72 y.o. male who presents for Medicare Annual/Subsequent preventive examination.  Review of Systems     Cardiac Risk Factors include: advanced age (>57men, >27 women);dyslipidemia;hypertension;male gender     Objective:    Today's Vitals   11/27/22 1554  Weight: 130 lb (59 kg)  Height: 5\' 5"  (1.651 m)   Body mass index is 21.63 kg/m.     11/27/2022    4:09 PM 12/17/2021    2:56 PM 11/01/2021    6:00 PM 10/29/2021   10:42 PM 10/22/2021   11:03 AM 09/21/2020    4:01 PM 08/23/2019    4:54 PM  Advanced Directives  Does Patient Have a Medical Advance Directive? No Yes Unable to assess, patient is non-responsive or altered mental status No No No No  Type of Advance Directive  Living will       Would patient like information on creating a medical advance directive?    No - Patient declined No - Patient declined No - Patient declined No - Patient declined    Current Medications (verified) Outpatient Encounter Medications as of 11/27/2022  Medication Sig   aspirin EC 81 MG tablet Take 1 tablet (81 mg total) by mouth daily. Swallow whole.   atorvastatin (LIPITOR) 40 MG tablet TAKE 1 TABLET(40 MG) BY MOUTH AT BEDTIME   baclofen (LIORESAL) 10 MG tablet Take 1 tablet (10 mg total) by mouth 2 (two) times daily.   docusate sodium (COLACE) 100 MG capsule Take 1 capsule (100 mg total) by mouth daily as needed.   gabapentin (NEURONTIN) 300 MG capsule Take 1 capsule (300 mg total) by mouth daily.   lisinopril (ZESTRIL) 2.5 MG tablet Take 1 tablet (2.5 mg total) by mouth daily.    tamsulosin (FLOMAX) 0.4 MG CAPS capsule Take 1 capsule (0.4 mg total) by mouth daily after supper.   acetaminophen (TYLENOL) 325 MG tablet Take 650 mg by mouth every 6 (six) hours as needed for moderate pain or headache. (Patient not taking: Reported on 09/24/2022)   No facility-administered encounter medications on file as of 11/27/2022.    Allergies (verified) Patient has no known allergies.   History: Past Medical History:  Diagnosis Date   CVA (cerebral vascular accident) (HCC) 10/22/2021   Hyperlipidemia    Hypertension    Past Surgical History:  Procedure Laterality Date   EYE SURGERY     EYE SURGERY     work injury, left eye, blind in left eye   History reviewed. No pertinent family history. Social History   Socioeconomic History   Marital status: Widowed    Spouse name: Not on file   Number of children: Not on file   Years of education: Not on file   Highest education level: Not on file  Occupational History   Occupation: employed   Occupation: Water quality scientist  Tobacco Use   Smoking status: Never   Smokeless tobacco: Never  Vaping Use   Vaping Use: Never used  Substance and Sexual Activity   Alcohol use: No   Drug use: No   Sexual activity: Not Currently  Other Topics Concern  Not on file  Social History Narrative   Lives with Daughter [name]   Language [name]   Social Determinants of Health   Financial Resource Strain: Low Risk  (11/27/2022)   Overall Financial Resource Strain (CARDIA)    Difficulty of Paying Living Expenses: Not hard at all  Food Insecurity: No Food Insecurity (11/27/2022)   Hunger Vital Sign    Worried About Running Out of Food in the Last Year: Never true    Ran Out of Food in the Last Year: Never true  Transportation Needs: No Transportation Needs (11/27/2022)   PRAPARE - Administrator, Civil Service (Medical): No    Lack of Transportation (Non-Medical): No  Physical Activity: Inactive (11/27/2022)   Exercise  Vital Sign    Days of Exercise per Week: 0 days    Minutes of Exercise per Session: 0 min  Stress: Stress Concern Present (11/27/2022)   [employer] of Occupational Health - Occupational Stress Questionnaire    Feeling of Stress : To some extent  Social Connections: Unknown (10/29/2021)   Social Connection and Isolation Panel [NHANES]    Frequency of Communication with Friends and Family: Three times a week    Frequency of Social Gatherings with Friends and Family: Three times a week    Attends Religious Services: Patient declined    Active Member of Clubs or Organizations: Patient declined    Attends Banker Meetings: Patient declined    Marital Status: Widowed    Tobacco Counseling Counseling given: Not Answered   Clinical Intake:  Pre-visit preparation completed: Yes  Pain : No/denies pain     Nutritional Status: BMI of 19-24  Normal Nutritional Risks: None Diabetes: No  How often do you need to have someone help you when you read instructions, pamphlets, or other written materials from your doctor or pharmacy?: 5 - Always  Diabetic? no  Interpreter Needed?: No  Comments: daughter interprets Information entered by :: NAllen LPN   Activities of Daily Living    11/27/2022    4:12 PM  In your present state of health, do you have any difficulty performing the following activities:  Hearing? 0  Vision? 1  Comment blind in left eye  Difficulty concentrating or making decisions? 1  Walking or climbing stairs? 1  Dressing or bathing? 1  Doing errands, shopping? 1  Preparing Food and eating ? Y  Using the Toilet? N  In the past six months, have you accidently leaked urine? Y  Do you have problems with loss of bowel control? N  Managing your Medications? Y  Managing your Finances? Y  Housekeeping or managing your Housekeeping? Y    Patient Care Team: [name], FNP as PCP - General (General Practice)  Indicate any recent Medical Services you  may have received from other than [facility] providers in the past year (date may be approximate).     Assessment:   This is a routine wellness examination for Douglas Pruitt.  Hearing/Vision screen Vision Screening - Comments:: No regular eye exams  Dietary issues and exercise activities discussed: Current Exercise Habits: The patient does not participate in regular exercise at present   Goals Addressed             This Visit's Progress    Patient Stated       11/27/2022, wants to get home health       Depression Screen    11/27/2022    4:11 PM 09/24/2022    3:59  PM 02/05/2022    1:24 PM 12/17/2021    2:57 PM 10/29/2021    3:06 PM 10/22/2021    9:47 AM 09/21/2020    4:03 PM  PHQ 2/9 Scores  PHQ - 2 Score 2 2 2 4  0 0 0  PHQ- 9 Score 2 14  16   0     Fall Risk    11/27/2022    4:10 PM 09/24/2022    3:59 PM 02/05/2022    1:23 PM 12/17/2021    2:56 PM 10/22/2021    9:47 AM  Fall Risk   Falls in the past year? 1 1 1 1  0  Comment imbalance due to stroke  Last fall in June 2023 at [facility].    Number falls in past yr: 1 1 1 1  0  Injury with Fall? 1 0 0 1 0  Comment fractured left shoulder   both knee   Risk for fall due to : History of fall(s);Impaired balance/gait;Impaired mobility;Medication side effect History of fall(s);Impaired balance/gait;Impaired mobility;Mental status change     Follow up Falls prevention discussed;Education provided;Falls evaluation completed Falls evaluation completed       FALL RISK PREVENTION PERTAINING TO THE HOME:  Any stairs in or around the home? Yes  If so, are there any without handrails? Yes  Home free of loose throw rugs in walkways, pet beds, electrical cords, etc? Yes  Adequate lighting in your home to reduce risk of falls? Yes   ASSISTIVE DEVICES UTILIZED TO PREVENT FALLS:  Life alert? No  Use of a cane, walker or w/c? Yes  Grab bars in the bathroom? No  Shower chair or bench in shower? Yes  Elevated toilet seat or a handicapped toilet? No    TIMED UP AND GO:  Was the test performed? No .      Cognitive Function:  6 CIT not administered due to language barrier. Daughter interpreted.        08/23/2019    4:02 PM  6CIT Screen  What Year? 0 points  What month? 0 points  What time? 0 points  Count back from 20 2 points  Months in reverse 4 points  Repeat phrase 10 points  Total Score 16 points    Immunizations Immunization History  Administered Date(s) Administered   Fluad Quad(high Dose 65+) 05/10/2021   Influenza, High Dose Seasonal PF 08/23/2019   Tdap 07/03/2011    TDAP status: Up to date  Flu Vaccine status: Up to date  Pneumococcal vaccine status: Up to date  Covid-19 vaccine status: Declined, Education has been provided regarding the importance of this vaccine but patient still declined. Advised may receive this vaccine at local pharmacy or Health Dept.or vaccine clinic. Aware to provide a copy of the vaccination record if obtained from local pharmacy or Health Dept. Verbalized acceptance and understanding.  Qualifies for Shingles Vaccine? Yes   Zostavax completed No   Shingrix Completed?: No.    Education has been provided regarding the importance of this vaccine. Patient has been advised to call insurance company to determine out of pocket expense if they have not yet received this vaccine. Advised may also receive vaccine at local pharmacy or Health Dept. Verbalized acceptance and understanding.  Screening Tests Health Maintenance  Topic Date Due   COVID-19 Vaccine (1) Never done   Pneumonia Vaccine 64+ Years old (1 of 2 - PCV) Never done   COLONOSCOPY (Pts 45-4yrs Insurance coverage will need to be confirmed)  Never done  Zoster Vaccines- Shingrix (1 of 2) Never done   DTaP/Tdap/Td (2 - Td or Tdap) 07/02/2021   INFLUENZA VACCINE  02/27/2023   Medicare Annual Wellness (AWV)  11/27/2023   Hepatitis C Screening  Completed   HPV VACCINES  Aged Out    Health Maintenance  Health  Maintenance Due  Topic Date Due   COVID-19 Vaccine (1) Never done   Pneumonia Vaccine 63+ Years old (1 of 2 - PCV) Never done   COLONOSCOPY (Pts 45-83yrs Insurance coverage will need to be confirmed)  Never done   Zoster Vaccines- Shingrix (1 of 2) Never done   DTaP/Tdap/Td (2 - Td or Tdap) 07/02/2021    Colorectal cancer screening: has the cologuard kit  Lung Cancer Screening: (Low Dose CT Chest recommended if Age 34-80 years, 30 pack-year currently smoking OR have quit w/in 15years.) does not qualify.   Lung Cancer Screening Referral: no  Additional Screening:  Hepatitis C Screening: does qualify; Completed 03/11/2019  Vision Screening: Recommended annual ophthalmology exams for early detection of glaucoma and other disorders of the eye. Is the patient up to date with their annual eye exam?  No  Who is the provider or what is the name of the office in which the patient attends annual eye exams? none If pt is not established with a provider, would they like to be referred to a provider to establish care? No .   Dental Screening: Recommended annual dental exams for proper oral hygiene  Community Resource Referral / Chronic Care Management: CRR required this visit?  No   CCM required this visit?  No      Plan:     I have personally reviewed and noted the following in the patient's chart:   Medical and social history Use of alcohol, tobacco or illicit drugs  Current medications and supplements including opioid prescriptions. Patient is not currently taking opioid prescriptions. Functional ability and status Nutritional status Physical activity Advanced directives List of other physicians Hospitalizations, surgeries, and ER visits in previous 12 months Vitals Screenings to include cognitive, depression, and falls Referrals and appointments  In addition, I have reviewed and discussed with patient certain preventive protocols, quality metrics, and best practice  recommendations. A written personalized care plan for preventive services as well as general preventive health recommendations were provided to patient.     Barb Merino, LPN   08/03/1094   Nurse Notes: none  Due to this being a virtual visit, the after visit summary with patients personalized plan was offered to patient via mail or my-chart.  Patient preferred to pick up at office at next visit

## 2022-11-27 NOTE — Patient Instructions (Signed)
Douglas Pruitt , Thank you for taking time to come for your Medicare Wellness Visit. I appreciate your ongoing commitment to your health goals. Please review the following plan we discussed and let me know if I can assist you in the future.   These are the goals we discussed:  Goals      CCM Expected Outcome:  Monitor, Self-Manage and Reduce Symptoms of:     Patient Stated     Daughter states "he would like to keep himself busy", speaks Douglas Pruitt     Patient Stated     09/21/2020, stay healthy     Patient Stated     11/27/2022, wants to get home health        This is a list of the screening recommended for you and due dates:  Health Maintenance  Topic Date Due   COVID-19 Vaccine (1) Never done   Pneumonia Vaccine (1 of 2 - PCV) Never done   Colon Cancer Screening  Never done   Zoster (Shingles) Vaccine (1 of 2) Never done   DTaP/Tdap/Td vaccine (2 - Td or Tdap) 07/02/2021   Flu Shot  02/27/2023   Medicare Annual Wellness Visit  11/27/2023   Hepatitis C Screening: USPSTF Recommendation to screen - Ages 18-74 yo.  Completed   HPV Vaccine  Aged Out    Advanced directives: Advance directive discussed with you today.   Conditions/risks identified: none  Next appointment: Follow up in one year for your annual wellness visit.   Preventive Care 79 Years and Older, Male  Preventive care refers to lifestyle choices and visits with your health care provider that can promote health and wellness. What does preventive care include? A yearly physical exam. This is also called [place] annual well check. Dental exams once or twice a year. Routine eye exams. Ask your health care provider how often you should have your eyes checked. Personal lifestyle choices, including: Daily care of your teeth and gums. Regular physical activity. Eating a healthy diet. Avoiding tobacco and drug use. Limiting alcohol use. Practicing safe sex. Taking low doses of aspirin every day. Taking vitamin and  mineral supplements as recommended by your health care provider. What happens during [place] annual well check? The services and screenings done by your health care provider during your annual well check will depend on your age, overall health, lifestyle risk factors, and family history of disease. Counseling  Your health care provider may ask you questions about your: Alcohol use. Tobacco use. Drug use. Emotional well-being. Home and relationship well-being. Sexual activity. Eating habits. History of falls. Memory and ability to understand (cognition). Work and work Astronomer. Screening  You may have the following tests or measurements: Height, weight, and BMI. Blood pressure. Lipid and cholesterol levels. These may be checked every 5 years, or more frequently if you are over 40 years old. Skin check. Lung cancer screening. You may have this screening every year starting at age 34 if you have a 30-pack-year history of smoking and currently smoke or have quit within the past 15 years. Fecal occult blood test (FOBT) of the stool. You may have this test every year starting at age 45. Flexible sigmoidoscopy or colonoscopy. You may have a sigmoidoscopy every 5 years or a colonoscopy every 10 years starting at age 60. Prostate cancer screening. Recommendations will vary depending on your family history and other risks. Hepatitis C blood test. Hepatitis B blood test. Sexually transmitted disease (STD) testing. Diabetes screening. This is done by checking  your blood sugar (glucose) after you have not eaten for a while (fasting). You may have this done every 1-3 years. Abdominal aortic aneurysm (AAA) screening. You may need this if you are a current or former smoker. Osteoporosis. You may be screened starting at age 100 if you are at high risk. Talk with your health care provider about your test results, treatment options, and if necessary, the need for more tests. Vaccines  Your health care  provider may recommend certain vaccines, such as: Influenza vaccine. This is recommended every year. Tetanus, diphtheria, and acellular pertussis (Tdap, Td) vaccine. You may need a Td booster every 10 years. Zoster vaccine. You may need this after age 61. Pneumococcal 13-valent conjugate (PCV13) vaccine. One dose is recommended after age 10. Pneumococcal polysaccharide (PPSV23) vaccine. One dose is recommended after age 67. Talk to your health care provider about which screenings and vaccines you need and how often you need them. This information is not intended to replace advice given to you by your health care provider. Make sure you discuss any questions you have with your health care provider. Document Released: 08/11/2015 Document Revised: 04/03/2016 Document Reviewed: 05/16/2015 Elsevier Interactive Patient Education  2017 [place] Prevention in the Home Falls can cause injuries. They can happen to people of all ages. There are many things you can do to make your home safe and to help prevent falls. What can I do on the outside of my home? Regularly fix the edges of walkways and driveways and fix any cracks. Remove anything that might make you trip as you walk through a door, such as a raised step or threshold. Trim any bushes or trees on the path to your home. Use bright outdoor lighting. Clear any walking paths of anything that might make someone trip, such as rocks or tools. Regularly check to see if handrails are loose or broken. Make sure that both sides of any steps have handrails. Any raised decks and porches should have guardrails on the edges. Have any leaves, snow, or ice cleared regularly. Use sand or salt on walking paths during winter. Clean up any spills in your garage right away. This includes oil or grease spills. What can I do in the bathroom? Use night lights. Install grab bars by the toilet and in the tub and shower. Do not use towel bars as grab  bars. Use non-skid mats or decals in the tub or shower. If you need to sit down in the shower, use a plastic, non-slip stool. Keep the floor dry. Clean up any water that spills on the floor as soon as it happens. Remove soap buildup in the tub or shower regularly. Attach bath mats securely with double-sided non-slip rug tape. Do not have throw rugs and other things on the floor that can make you trip. What can I do in the bedroom? Use night lights. Make sure that you have a light by your bed that is easy to reach. Do not use any sheets or blankets that are too big for your bed. They should not hang down onto the floor. Have a firm chair that has side arms. You can use this for support while you get dressed. Do not have throw rugs and other things on the floor that can make you trip. What can I do in the kitchen? Clean up any spills right away. Avoid walking on wet floors. Keep items that you use a lot in easy-to-reach places. If you need to reach something  above you, use a strong step stool that has a grab bar. Keep electrical cords out of the way. Do not use floor polish or wax that makes floors slippery. If you must use wax, use non-skid floor wax. Do not have throw rugs and other things on the floor that can make you trip. What can I do with my stairs? Do not leave any items on the stairs. Make sure that there are handrails on both sides of the stairs and use them. Fix handrails that are broken or loose. Make sure that handrails are as long as the stairways. Check any carpeting to make sure that it is firmly attached to the stairs. Fix any carpet that is loose or worn. Avoid having throw rugs at the top or bottom of the stairs. If you do have throw rugs, attach them to the floor with carpet tape. Make sure that you have a light switch at the top of the stairs and the bottom of the stairs. If you do not have them, ask someone to add them for you. What else can I do to help prevent  falls? Wear shoes that: Do not have high heels. Have rubber bottoms. Are comfortable and fit you well. Are closed at the toe. Do not wear sandals. If you use a stepladder: Make sure that it is fully opened. Do not climb a closed stepladder. Make sure that both sides of the stepladder are locked into place. Ask someone to hold it for you, if possible. Clearly mark and make sure that you can see: Any grab bars or handrails. First and last steps. Where the edge of each step is. Use tools that help you move around (mobility aids) if they are needed. These include: Canes. Walkers. Scooters. Crutches. Turn on the lights when you go into a dark area. Replace any light bulbs as soon as they burn out. Set up your furniture so you have a clear path. Avoid moving your furniture around. If any of your floors are uneven, fix them. If there are any pets around you, be aware of where they are. Review your medicines with your doctor. Some medicines can make you feel dizzy. This can increase your chance of falling. Ask your doctor what other things that you can do to help prevent falls. This information is not intended to replace advice given to you by your health care provider. Make sure you discuss any questions you have with your health care provider. Document Released: 05/11/2009 Document Revised: 12/21/2015 Document Reviewed: 08/19/2014 Elsevier Interactive Patient Education  2017 [employer].

## 2022-11-28 ENCOUNTER — Other Ambulatory Visit: Payer: Self-pay | Admitting: Nurse Practitioner

## 2022-11-28 DIAGNOSIS — I10 Essential (primary) hypertension: Secondary | ICD-10-CM

## 2022-12-16 DIAGNOSIS — S42215D Unspecified nondisplaced fracture of surgical neck of left humerus, subsequent encounter for fracture with routine healing: Secondary | ICD-10-CM | POA: Diagnosis not present

## 2022-12-24 ENCOUNTER — Other Ambulatory Visit: Payer: Self-pay | Admitting: Nurse Practitioner

## 2022-12-24 DIAGNOSIS — I10 Essential (primary) hypertension: Secondary | ICD-10-CM

## 2022-12-31 ENCOUNTER — Telehealth: Payer: Self-pay

## 2022-12-31 NOTE — Telephone Encounter (Signed)
Clifton Custard pill from pt called to continue services- services verbally giving.

## 2023-01-01 DIAGNOSIS — G4700 Insomnia, unspecified: Secondary | ICD-10-CM | POA: Diagnosis not present

## 2023-01-01 DIAGNOSIS — Z9181 History of falling: Secondary | ICD-10-CM | POA: Diagnosis not present

## 2023-01-01 DIAGNOSIS — Z7982 Long term (current) use of aspirin: Secondary | ICD-10-CM | POA: Diagnosis not present

## 2023-01-01 DIAGNOSIS — H5462 Unqualified visual loss, left eye, normal vision right eye: Secondary | ICD-10-CM | POA: Diagnosis not present

## 2023-01-01 DIAGNOSIS — S42251D Displaced fracture of greater tuberosity of right humerus, subsequent encounter for fracture with routine healing: Secondary | ICD-10-CM | POA: Diagnosis not present

## 2023-01-01 DIAGNOSIS — I69354 Hemiplegia and hemiparesis following cerebral infarction affecting left non-dominant side: Secondary | ICD-10-CM | POA: Diagnosis not present

## 2023-01-01 DIAGNOSIS — E782 Mixed hyperlipidemia: Secondary | ICD-10-CM | POA: Diagnosis not present

## 2023-01-01 DIAGNOSIS — F321 Major depressive disorder, single episode, moderate: Secondary | ICD-10-CM | POA: Diagnosis not present

## 2023-01-01 DIAGNOSIS — Z556 Problems related to health literacy: Secondary | ICD-10-CM | POA: Diagnosis not present

## 2023-01-01 DIAGNOSIS — I10 Essential (primary) hypertension: Secondary | ICD-10-CM | POA: Diagnosis not present

## 2023-01-06 DIAGNOSIS — Z556 Problems related to health literacy: Secondary | ICD-10-CM | POA: Diagnosis not present

## 2023-01-06 DIAGNOSIS — S42251D Displaced fracture of greater tuberosity of right humerus, subsequent encounter for fracture with routine healing: Secondary | ICD-10-CM | POA: Diagnosis not present

## 2023-01-06 DIAGNOSIS — I10 Essential (primary) hypertension: Secondary | ICD-10-CM | POA: Diagnosis not present

## 2023-01-06 DIAGNOSIS — I69354 Hemiplegia and hemiparesis following cerebral infarction affecting left non-dominant side: Secondary | ICD-10-CM | POA: Diagnosis not present

## 2023-01-06 DIAGNOSIS — F321 Major depressive disorder, single episode, moderate: Secondary | ICD-10-CM | POA: Diagnosis not present

## 2023-01-06 DIAGNOSIS — H5462 Unqualified visual loss, left eye, normal vision right eye: Secondary | ICD-10-CM | POA: Diagnosis not present

## 2023-01-08 DIAGNOSIS — H5462 Unqualified visual loss, left eye, normal vision right eye: Secondary | ICD-10-CM | POA: Diagnosis not present

## 2023-01-08 DIAGNOSIS — I10 Essential (primary) hypertension: Secondary | ICD-10-CM | POA: Diagnosis not present

## 2023-01-08 DIAGNOSIS — F321 Major depressive disorder, single episode, moderate: Secondary | ICD-10-CM | POA: Diagnosis not present

## 2023-01-08 DIAGNOSIS — S42251D Displaced fracture of greater tuberosity of right humerus, subsequent encounter for fracture with routine healing: Secondary | ICD-10-CM | POA: Diagnosis not present

## 2023-01-08 DIAGNOSIS — Z556 Problems related to health literacy: Secondary | ICD-10-CM | POA: Diagnosis not present

## 2023-01-08 DIAGNOSIS — I69354 Hemiplegia and hemiparesis following cerebral infarction affecting left non-dominant side: Secondary | ICD-10-CM | POA: Diagnosis not present

## 2023-01-13 ENCOUNTER — Ambulatory Visit (INDEPENDENT_AMBULATORY_CARE_PROVIDER_SITE_OTHER): Payer: Medicare Other | Admitting: Nurse Practitioner

## 2023-01-13 ENCOUNTER — Encounter: Payer: Self-pay | Admitting: Nurse Practitioner

## 2023-01-13 VITALS — BP 130/72 | HR 67 | Temp 98.4°F

## 2023-01-13 DIAGNOSIS — R7309 Other abnormal glucose: Secondary | ICD-10-CM | POA: Diagnosis not present

## 2023-01-13 DIAGNOSIS — W19XXXA Unspecified fall, initial encounter: Secondary | ICD-10-CM | POA: Insufficient documentation

## 2023-01-13 DIAGNOSIS — E782 Mixed hyperlipidemia: Secondary | ICD-10-CM

## 2023-01-13 DIAGNOSIS — Z2821 Immunization not carried out because of patient refusal: Secondary | ICD-10-CM

## 2023-01-13 DIAGNOSIS — G8194 Hemiplegia, unspecified affecting left nondominant side: Secondary | ICD-10-CM | POA: Insufficient documentation

## 2023-01-13 DIAGNOSIS — S42251D Displaced fracture of greater tuberosity of right humerus, subsequent encounter for fracture with routine healing: Secondary | ICD-10-CM | POA: Diagnosis not present

## 2023-01-13 DIAGNOSIS — H5462 Unqualified visual loss, left eye, normal vision right eye: Secondary | ICD-10-CM | POA: Diagnosis not present

## 2023-01-13 DIAGNOSIS — I10 Essential (primary) hypertension: Secondary | ICD-10-CM | POA: Diagnosis not present

## 2023-01-13 DIAGNOSIS — Z23 Encounter for immunization: Secondary | ICD-10-CM

## 2023-01-13 DIAGNOSIS — W19XXXD Unspecified fall, subsequent encounter: Secondary | ICD-10-CM

## 2023-01-13 DIAGNOSIS — Z8781 Personal history of (healed) traumatic fracture: Secondary | ICD-10-CM

## 2023-01-13 DIAGNOSIS — I69354 Hemiplegia and hemiparesis following cerebral infarction affecting left non-dominant side: Secondary | ICD-10-CM | POA: Diagnosis not present

## 2023-01-13 DIAGNOSIS — Z556 Problems related to health literacy: Secondary | ICD-10-CM | POA: Diagnosis not present

## 2023-01-13 DIAGNOSIS — Z8673 Personal history of transient ischemic attack (TIA), and cerebral infarction without residual deficits: Secondary | ICD-10-CM

## 2023-01-13 DIAGNOSIS — I119 Hypertensive heart disease without heart failure: Secondary | ICD-10-CM | POA: Diagnosis not present

## 2023-01-13 DIAGNOSIS — Y92009 Unspecified place in unspecified non-institutional (private) residence as the place of occurrence of the external cause: Secondary | ICD-10-CM | POA: Insufficient documentation

## 2023-01-13 DIAGNOSIS — F321 Major depressive disorder, single episode, moderate: Secondary | ICD-10-CM | POA: Diagnosis not present

## 2023-01-13 MED ORDER — ASPIRIN 81 MG PO TBEC: 81.0000 mg | DELAYED_RELEASE_TABLET | Freq: Every day | ORAL | 1 refills | Status: AC

## 2023-01-13 NOTE — Progress Notes (Addendum)
I,Issa Kosmicki,acting as a Neurosurgeon for [name], FNP.,have documented all relevant documentation on the behalf of [name], FNP,as directed by  [name], FNP while in the presence of [name], FNP.  Subjective:  Patient ID: Douglas Pruitt , male    DOB: 07-Sep-1950 , 72 y.o.   MRN: [id number]  Chief Complaint  Patient presents with   Hypertension    HPI  Patient presents today for a dm, and chol check patient reports compliance with medications and has no other concerns today. Patient denies any chest pain, SOB, and headaches. Patient's daughter wants you to send aspirin 81mg  to the pharmacy.  Douglas Pruitt now has PCS services 2.5 hours a day Mon- Fri since April. PT has restarted last week from [facility].   Douglas Pruitt has not been back to the Neurologist     Past Medical History:  Diagnosis Date   Acute CVA (cerebrovascular accident) (HCC) 10/30/2021   CVA (cerebral vascular accident) (HCC) 10/22/2021   Hyperlipidemia    Hypertension      History reviewed. No pertinent family history.   Current Outpatient Medications:    atorvastatin (LIPITOR) 40 MG tablet, TAKE 1 TABLET(40 MG) BY MOUTH AT BEDTIME, Disp: 90 tablet, Rfl: 2   docusate sodium (COLACE) 100 MG capsule, Take 1 capsule (100 mg total) by mouth daily as needed., Disp: 90 capsule, Rfl: 1   lisinopril (ZESTRIL) 2.5 MG tablet, TAKE 1 TABLET(2.5 MG) BY MOUTH DAILY, Disp: 90 tablet, Rfl: 0   tamsulosin (FLOMAX) 0.4 MG CAPS capsule, Take 1 capsule (0.4 mg total) by mouth daily after supper., Disp: 90 capsule, Rfl: 1   acetaminophen (TYLENOL) 325 MG tablet, Take 650 mg by mouth every 6 (six) hours as needed for moderate pain or headache. (Patient not taking: Reported on 09/24/2022), Disp: , Rfl:    aspirin EC 81 MG tablet, Take 1 tablet (81 mg total) by mouth daily. Swallow whole., Disp: 90 tablet, Rfl: 1   baclofen (LIORESAL) 10 MG tablet, TAKE 1 TABLET(10 MG) BY MOUTH TWICE DAILY, Disp: 30 tablet, Rfl: 2   gabapentin (NEURONTIN) 300 MG  capsule, TAKE 1 CAPSULE(300 MG) BY MOUTH DAILY, Disp: 90 capsule, Rfl: 1   No Known Allergies   Review of Systems  Constitutional: Negative.   Eyes: Negative.   Musculoskeletal: Negative.   Skin: Negative.   Psychiatric/Behavioral: Negative.       Today's Vitals   01/13/23 1033  BP: 130/72  Pulse: 67  Temp: 98.4 F (36.9 C)  PainSc: 7   PainLoc: Toe   There is no height or weight on file to calculate BMI.  Wt Readings from Last 3 Encounters:  11/27/22 130 lb (59 kg)  09/24/22 134 lb (60.8 kg)  02/21/22 134 lb (60.8 kg)    The ASCVD Risk score (Arnett DK, et al., 2019) failed to calculate for the following reasons:   The patient has a prior MI or stroke diagnosis  Objective:  Physical Exam Vitals reviewed.  Constitutional:      General: Douglas Pruitt is not in acute distress.    Appearance: Normal appearance. Douglas Pruitt is normal weight.  Cardiovascular:     Rate and Rhythm: Normal rate and regular rhythm.     Pulses: Normal pulses.     Heart sounds: Normal heart sounds. No murmur heard. Pulmonary:     Effort: Pulmonary effort is normal. No respiratory distress.     Breath sounds: Normal breath sounds. No wheezing.  Musculoskeletal:        General: Deformity (  hemiplegia upper and lower left extremity) present. No swelling or tenderness. Normal range of motion.     Cervical back: Normal range of motion and neck supple.     Right lower leg: No edema.     Left lower leg: No edema.     Comments: Left upper and lower extremity are flaccid.   Skin:    General: Skin is warm and dry.     Capillary Refill: Capillary refill takes less than 2 seconds.  Neurological:     General: No focal deficit present.     Mental Status: Douglas Pruitt is alert and oriented to person, place, and time.     Cranial Nerves: No cranial nerve deficit.     Motor: No weakness.  Psychiatric:        Mood and Affect: Mood normal.        Behavior: Behavior normal.        Thought Content: Thought content normal.         Judgment: Judgment normal.         Assessment And Plan:  1. Hypertensive heart disease without heart failure Comments: Blood pressure is controlled with the systolic borderline, continue current medications. Has not taken BP meds at this time due to not eating. - CMP14+EGFR  2. Mixed hyperlipidemia Comments: Cholesterol levels have improved, continue statin. Tolerating well. - Lipid panel  3. Abnormal glucose Comments: HgbA1c has been normal. Continue focusing on healthy diet low in sugar and starches. - Hemoglobin A1c  4. Fall, subsequent encounter Comments: Has had 4 falls since last visit, now back on PT. Only one fall suffered Fractured shoulder and seen at [facility] doing well. No further follow up.  5. Hemiparesis affecting left side as late effect of cerebrovascular accident [facility]) Comments: Has done PT, minimal use with this extremity  6. Encounter for immunization - Pneumococcal conjugate vaccine 20-valent (Prevnar 20)  7. COVID-19 vaccination declined  8. Herpes zoster vaccination declined  9. History of stroke  10. History of left shoulder fracture Comments: Seen at [facility], healed on its own    Return in about 4 months (around 05/15/2023) for bp check.  Patient was given opportunity to ask questions. Patient verbalized understanding of the plan and was able to repeat key elements of the plan. All questions were answered to their satisfaction.  [name], FNP  I, [name], FNP, have reviewed all documentation for this visit. The documentation on 02/24/23 for the exam, diagnosis, procedures, and orders are all accurate and complete.   IF YOU HAVE BEEN REFERRED TO A SPECIALIST, IT MAY TAKE 1-2 WEEKS TO SCHEDULE/PROCESS THE REFERRAL. IF YOU HAVE NOT HEARD FROM US/SPECIALIST IN TWO WEEKS, PLEASE GIVE [place] A CALL AT [phone].

## 2023-01-13 NOTE — Patient Instructions (Signed)
Hypertension, Adult Hypertension is another name for high blood pressure. High blood pressure forces your heart to work harder to pump blood. This can cause problems over time. There are two numbers in a blood pressure reading. There is a top number (systolic) over a bottom number (diastolic). It is best to have a blood pressure that is below 120/80. What are the causes? The cause of this condition is not known. Some other conditions can lead to high blood pressure. What increases the risk? Some lifestyle factors can make you more likely to develop high blood pressure: Smoking. Not getting enough exercise or physical activity. Being overweight. Having too much fat, sugar, calories, or salt (sodium) in your diet. Drinking too much alcohol. Other risk factors include: Having any of these conditions: Heart disease. Diabetes. High cholesterol. Kidney disease. Obstructive sleep apnea. Having a family history of high blood pressure and high cholesterol. Age. The risk increases with age. Stress. What are the signs or symptoms? High blood pressure may not cause symptoms. Very high blood pressure (hypertensive crisis) may cause: Headache. Fast or uneven heartbeats (palpitations). Shortness of breath. Nosebleed. Vomiting or feeling like you may vomit (nauseous). Changes in how you see. Very bad chest pain. Feeling dizzy. Seizures. How is this treated? This condition is treated by making healthy lifestyle changes, such as: Eating healthy foods. Exercising more. Drinking less alcohol. Your doctor may prescribe medicine if lifestyle changes do not help enough and if: Your top number is above 130. Your bottom number is above 80. Your personal target blood pressure may vary. Follow these instructions at home: Eating and drinking  If told, follow the DASH eating plan. To follow this plan: Fill one half of your plate at each meal with fruits and vegetables. Fill one fourth of your plate  at each meal with whole grains. Whole grains include whole-wheat pasta, brown rice, and whole-grain bread. Eat or drink low-fat dairy products, such as skim milk or low-fat yogurt. Fill one fourth of your plate at each meal with low-fat (lean) proteins. Low-fat proteins include fish, chicken without skin, eggs, beans, and tofu. Avoid fatty meat, cured and processed meat, or chicken with skin. Avoid pre-made or processed food. Limit the amount of salt in your diet to less than 1,500 mg each day. Do not drink alcohol if: Your doctor tells you not to drink. You are pregnant, may be pregnant, or are planning to become pregnant. If you drink alcohol: Limit how much you have to: 0-1 drink a day for women. 0-2 drinks a day for men. Know how much alcohol is in your drink. In the [place] one drink equals one 12 oz bottle of beer (355 mL), one 5 oz glass of wine (148 mL), or one 1 oz glass of hard liquor (44 mL). Lifestyle  Work with your doctor to stay at a healthy weight or to lose weight. Ask your doctor what the best weight is for you. Get at least 30 minutes of exercise that causes your heart to beat faster (aerobic exercise) most days of the week. This may include walking, swimming, or biking. Get at least 30 minutes of exercise that strengthens your muscles (resistance exercise) at least 3 days a week. This may include lifting weights or doing Pilates. Do not smoke or use any products that contain nicotine or tobacco. If you need help quitting, ask your doctor. Check your blood pressure at home as told by your doctor. Keep all follow-up visits. Medicines Take over-the-counter and prescription medicines   only as told by your doctor. Follow directions carefully. Do not skip doses of blood pressure medicine. The medicine does not work as well if you skip doses. Skipping doses also puts you at risk for problems. Ask your doctor about side effects or reactions to medicines that you should watch  for. Contact a doctor if: You think you are having a reaction to the medicine you are taking. You have headaches that keep coming back. You feel dizzy. You have swelling in your ankles. You have trouble with your vision. Get help right away if: You get a very bad headache. You start to feel mixed up (confused). You feel weak or numb. You feel faint. You have very bad pain in your: Chest. Belly (abdomen). You vomit more than once. You have trouble breathing. These symptoms may be Javid emergency. Get help right away. Call 911. Do not wait to see if the symptoms will go away. Do not drive yourself to the hospital. Summary Hypertension is another name for high blood pressure. High blood pressure forces your heart to work harder to pump blood. For most people, a normal blood pressure is less than 120/80. Making healthy choices can help lower blood pressure. If your blood pressure does not get lower with healthy choices, you may need to take medicine. This information is not intended to replace advice given to you by your health care provider. Make sure you discuss any questions you have with your health care provider. Document Revised: 05/03/2021 Document Reviewed: 05/03/2021 Elsevier Patient Education  2024 [employer] Inc[employer]  

## 2023-01-14 LAB — LIPID PANEL
Chol/HDL Ratio: 3 ratio (ref 0.0–5.0)
Cholesterol, Total: 116 mg/dL (ref 100–199)
HDL: 39 mg/dL — ABNORMAL LOW (ref >39)
LDL Chol Calc (NIH): 54 mg/dL (ref 0–99)
Triglycerides: 126 mg/dL (ref 0–149)
VLDL Cholesterol Cal: 23 mg/dL (ref 5–40)

## 2023-01-14 LAB — CMP14+EGFR
ALT: 52 IU/L — ABNORMAL HIGH (ref 0–44)
AST: 30 IU/L (ref 0–40)
Albumin: 4.3 g/dL (ref 3.8–4.8)
Alkaline Phosphatase: 97 IU/L (ref 44–121)
BUN/Creatinine Ratio: 12 (ref 10–24)
BUN: 8 mg/dL (ref 8–27)
Bilirubin Total: 0.5 mg/dL (ref 0.0–1.2)
CO2: 27 mmol/L (ref 20–29)
Calcium: 9.4 mg/dL (ref 8.6–10.2)
Chloride: 103 mmol/L (ref 96–106)
Creatinine, Ser: 0.65 mg/dL — ABNORMAL LOW (ref 0.76–1.27)
Globulin, Total: 3.3 g/dL (ref 1.5–4.5)
Glucose: 90 mg/dL (ref 70–99)
Potassium: 4.2 mmol/L (ref 3.5–5.2)
Sodium: 142 mmol/L (ref 134–144)
Total Protein: 7.6 g/dL (ref 6.0–8.5)
eGFR: 99 mL/min/{1.73_m2} (ref >59)

## 2023-01-14 LAB — HEMOGLOBIN A1C
Est. average glucose Bld gHb Est-mCnc: 111 mg/dL
Hgb A1c MFr Bld: 5.5 % (ref 4.8–5.6)

## 2023-01-23 DIAGNOSIS — H5462 Unqualified visual loss, left eye, normal vision right eye: Secondary | ICD-10-CM | POA: Diagnosis not present

## 2023-01-23 DIAGNOSIS — I69354 Hemiplegia and hemiparesis following cerebral infarction affecting left non-dominant side: Secondary | ICD-10-CM | POA: Diagnosis not present

## 2023-01-23 DIAGNOSIS — Z556 Problems related to health literacy: Secondary | ICD-10-CM | POA: Diagnosis not present

## 2023-01-23 DIAGNOSIS — S42251D Displaced fracture of greater tuberosity of right humerus, subsequent encounter for fracture with routine healing: Secondary | ICD-10-CM | POA: Diagnosis not present

## 2023-01-23 DIAGNOSIS — F321 Major depressive disorder, single episode, moderate: Secondary | ICD-10-CM | POA: Diagnosis not present

## 2023-01-23 DIAGNOSIS — I10 Essential (primary) hypertension: Secondary | ICD-10-CM | POA: Diagnosis not present

## 2023-01-28 DIAGNOSIS — I10 Essential (primary) hypertension: Secondary | ICD-10-CM | POA: Diagnosis not present

## 2023-01-28 DIAGNOSIS — I69354 Hemiplegia and hemiparesis following cerebral infarction affecting left non-dominant side: Secondary | ICD-10-CM | POA: Diagnosis not present

## 2023-01-28 DIAGNOSIS — Z556 Problems related to health literacy: Secondary | ICD-10-CM | POA: Diagnosis not present

## 2023-01-28 DIAGNOSIS — H5462 Unqualified visual loss, left eye, normal vision right eye: Secondary | ICD-10-CM | POA: Diagnosis not present

## 2023-01-28 DIAGNOSIS — F321 Major depressive disorder, single episode, moderate: Secondary | ICD-10-CM | POA: Diagnosis not present

## 2023-01-28 DIAGNOSIS — S42251D Displaced fracture of greater tuberosity of right humerus, subsequent encounter for fracture with routine healing: Secondary | ICD-10-CM | POA: Diagnosis not present

## 2023-01-31 DIAGNOSIS — G4700 Insomnia, unspecified: Secondary | ICD-10-CM | POA: Diagnosis not present

## 2023-01-31 DIAGNOSIS — Z7982 Long term (current) use of aspirin: Secondary | ICD-10-CM | POA: Diagnosis not present

## 2023-01-31 DIAGNOSIS — S42251D Displaced fracture of greater tuberosity of right humerus, subsequent encounter for fracture with routine healing: Secondary | ICD-10-CM | POA: Diagnosis not present

## 2023-01-31 DIAGNOSIS — F321 Major depressive disorder, single episode, moderate: Secondary | ICD-10-CM | POA: Diagnosis not present

## 2023-01-31 DIAGNOSIS — I69354 Hemiplegia and hemiparesis following cerebral infarction affecting left non-dominant side: Secondary | ICD-10-CM | POA: Diagnosis not present

## 2023-01-31 DIAGNOSIS — Z556 Problems related to health literacy: Secondary | ICD-10-CM | POA: Diagnosis not present

## 2023-01-31 DIAGNOSIS — Z9181 History of falling: Secondary | ICD-10-CM | POA: Diagnosis not present

## 2023-01-31 DIAGNOSIS — I10 Essential (primary) hypertension: Secondary | ICD-10-CM | POA: Diagnosis not present

## 2023-01-31 DIAGNOSIS — E782 Mixed hyperlipidemia: Secondary | ICD-10-CM | POA: Diagnosis not present

## 2023-01-31 DIAGNOSIS — H5462 Unqualified visual loss, left eye, normal vision right eye: Secondary | ICD-10-CM | POA: Diagnosis not present

## 2023-02-03 DIAGNOSIS — I10 Essential (primary) hypertension: Secondary | ICD-10-CM | POA: Diagnosis not present

## 2023-02-03 DIAGNOSIS — H5462 Unqualified visual loss, left eye, normal vision right eye: Secondary | ICD-10-CM | POA: Diagnosis not present

## 2023-02-03 DIAGNOSIS — I69354 Hemiplegia and hemiparesis following cerebral infarction affecting left non-dominant side: Secondary | ICD-10-CM | POA: Diagnosis not present

## 2023-02-03 DIAGNOSIS — Z556 Problems related to health literacy: Secondary | ICD-10-CM | POA: Diagnosis not present

## 2023-02-03 DIAGNOSIS — F321 Major depressive disorder, single episode, moderate: Secondary | ICD-10-CM | POA: Diagnosis not present

## 2023-02-03 DIAGNOSIS — S42251D Displaced fracture of greater tuberosity of right humerus, subsequent encounter for fracture with routine healing: Secondary | ICD-10-CM | POA: Diagnosis not present

## 2023-02-05 ENCOUNTER — Other Ambulatory Visit: Payer: Self-pay | Admitting: Nurse Practitioner

## 2023-02-11 DIAGNOSIS — I69354 Hemiplegia and hemiparesis following cerebral infarction affecting left non-dominant side: Secondary | ICD-10-CM | POA: Diagnosis not present

## 2023-02-11 DIAGNOSIS — Z556 Problems related to health literacy: Secondary | ICD-10-CM | POA: Diagnosis not present

## 2023-02-11 DIAGNOSIS — I10 Essential (primary) hypertension: Secondary | ICD-10-CM | POA: Diagnosis not present

## 2023-02-11 DIAGNOSIS — H5462 Unqualified visual loss, left eye, normal vision right eye: Secondary | ICD-10-CM | POA: Diagnosis not present

## 2023-02-11 DIAGNOSIS — F321 Major depressive disorder, single episode, moderate: Secondary | ICD-10-CM | POA: Diagnosis not present

## 2023-02-11 DIAGNOSIS — S42251D Displaced fracture of greater tuberosity of right humerus, subsequent encounter for fracture with routine healing: Secondary | ICD-10-CM | POA: Diagnosis not present

## 2023-02-17 DIAGNOSIS — Z556 Problems related to health literacy: Secondary | ICD-10-CM | POA: Diagnosis not present

## 2023-02-17 DIAGNOSIS — H5462 Unqualified visual loss, left eye, normal vision right eye: Secondary | ICD-10-CM | POA: Diagnosis not present

## 2023-02-17 DIAGNOSIS — I10 Essential (primary) hypertension: Secondary | ICD-10-CM | POA: Diagnosis not present

## 2023-02-17 DIAGNOSIS — S42251D Displaced fracture of greater tuberosity of right humerus, subsequent encounter for fracture with routine healing: Secondary | ICD-10-CM | POA: Diagnosis not present

## 2023-02-17 DIAGNOSIS — F321 Major depressive disorder, single episode, moderate: Secondary | ICD-10-CM | POA: Diagnosis not present

## 2023-02-17 DIAGNOSIS — I69354 Hemiplegia and hemiparesis following cerebral infarction affecting left non-dominant side: Secondary | ICD-10-CM | POA: Diagnosis not present

## 2023-02-18 ENCOUNTER — Encounter: Payer: Self-pay | Admitting: Nurse Practitioner

## 2023-02-18 NOTE — Assessment & Plan Note (Signed)
Has done PT, minimal use with this extremity

## 2023-02-25 ENCOUNTER — Other Ambulatory Visit: Payer: Self-pay | Admitting: Nurse Practitioner

## 2023-02-25 DIAGNOSIS — I69354 Hemiplegia and hemiparesis following cerebral infarction affecting left non-dominant side: Secondary | ICD-10-CM

## 2023-02-25 NOTE — Progress Notes (Signed)
Chief Complaint  Patient presents with   home health initial certification   Received home health orders orders from [facility]. Start of care 08/05/2022.   Certification and orders from 08/05/2022 through 10/03/2022 are reviewed, signed and faxed back to home health company.  Need of intermittent skilled services at home: OT  The home health care plan has been established by me and will be reviewed and updated as needed to maximize patient recovery.  I certify that all home health services have been and will be furnished to the patient while under my care.  Face-to-face encounter in which the need for home health services was established: 09/24/2022 - was in nursing home until February  Patient is receiving home health services for the following diagnoses: Problem List Items Addressed This Visit       Nervous and Auditory   Hemiparesis affecting left side as late effect of cerebrovascular accident (HCC) - Primary     [name], FNP

## 2023-03-10 ENCOUNTER — Emergency Department (HOSPITAL_COMMUNITY): Payer: Medicare Other

## 2023-03-10 ENCOUNTER — Inpatient Hospital Stay (HOSPITAL_COMMUNITY)
Admission: EM | Admit: 2023-03-10 | Discharge: 2023-03-14 | DRG: 481 | Disposition: A | Discharge status: Skilled Nursing Facility | Payer: Medicare Other | Attending: Internal Medicine | Admitting: Internal Medicine

## 2023-03-10 ENCOUNTER — Encounter (HOSPITAL_COMMUNITY): Payer: Self-pay

## 2023-03-10 ENCOUNTER — Other Ambulatory Visit: Payer: Self-pay

## 2023-03-10 DIAGNOSIS — D649 Anemia, unspecified: Secondary | ICD-10-CM | POA: Diagnosis not present

## 2023-03-10 DIAGNOSIS — Z66 Do not resuscitate: Secondary | ICD-10-CM | POA: Diagnosis present

## 2023-03-10 DIAGNOSIS — D696 Thrombocytopenia, unspecified: Secondary | ICD-10-CM | POA: Diagnosis not present

## 2023-03-10 DIAGNOSIS — D72828 Other elevated white blood cell count: Secondary | ICD-10-CM | POA: Diagnosis present

## 2023-03-10 DIAGNOSIS — Z7982 Long term (current) use of aspirin: Secondary | ICD-10-CM

## 2023-03-10 DIAGNOSIS — W010XXA Fall on same level from slipping, tripping and stumbling without subsequent striking against object, initial encounter: Secondary | ICD-10-CM | POA: Diagnosis present

## 2023-03-10 DIAGNOSIS — H53132 Sudden visual loss, left eye: Secondary | ICD-10-CM | POA: Diagnosis present

## 2023-03-10 DIAGNOSIS — I10 Essential (primary) hypertension: Secondary | ICD-10-CM | POA: Diagnosis present

## 2023-03-10 DIAGNOSIS — R41841 Cognitive communication deficit: Secondary | ICD-10-CM | POA: Diagnosis not present

## 2023-03-10 DIAGNOSIS — M80852A Other osteoporosis with current pathological fracture, left femur, initial encounter for fracture: Secondary | ICD-10-CM | POA: Diagnosis present

## 2023-03-10 DIAGNOSIS — Z9181 History of falling: Secondary | ICD-10-CM

## 2023-03-10 DIAGNOSIS — I6930 Unspecified sequelae of cerebral infarction: Secondary | ICD-10-CM

## 2023-03-10 DIAGNOSIS — R296 Repeated falls: Secondary | ICD-10-CM | POA: Diagnosis present

## 2023-03-10 DIAGNOSIS — Z7401 Bed confinement status: Secondary | ICD-10-CM | POA: Diagnosis not present

## 2023-03-10 DIAGNOSIS — S0592XS Unspecified injury of left eye and orbit, sequela: Secondary | ICD-10-CM | POA: Diagnosis not present

## 2023-03-10 DIAGNOSIS — Z043 Encounter for examination and observation following other accident: Secondary | ICD-10-CM | POA: Diagnosis not present

## 2023-03-10 DIAGNOSIS — M6281 Muscle weakness (generalized): Secondary | ICD-10-CM | POA: Diagnosis not present

## 2023-03-10 DIAGNOSIS — I679 Cerebrovascular disease, unspecified: Secondary | ICD-10-CM | POA: Diagnosis not present

## 2023-03-10 DIAGNOSIS — T8484XD Pain due to internal orthopedic prosthetic devices, implants and grafts, subsequent encounter: Secondary | ICD-10-CM | POA: Diagnosis not present

## 2023-03-10 DIAGNOSIS — S72002A Fracture of unspecified part of neck of left femur, initial encounter for closed fracture: Principal | ICD-10-CM

## 2023-03-10 DIAGNOSIS — G8114 Spastic hemiplegia affecting left nondominant side: Secondary | ICD-10-CM | POA: Diagnosis not present

## 2023-03-10 DIAGNOSIS — D849 Immunodeficiency, unspecified: Secondary | ICD-10-CM | POA: Diagnosis not present

## 2023-03-10 DIAGNOSIS — Z79899 Other long term (current) drug therapy: Secondary | ICD-10-CM | POA: Diagnosis not present

## 2023-03-10 DIAGNOSIS — E782 Mixed hyperlipidemia: Secondary | ICD-10-CM | POA: Diagnosis not present

## 2023-03-10 DIAGNOSIS — S72142A Displaced intertrochanteric fracture of left femur, initial encounter for closed fracture: Secondary | ICD-10-CM | POA: Diagnosis not present

## 2023-03-10 DIAGNOSIS — S72142D Displaced intertrochanteric fracture of left femur, subsequent encounter for closed fracture with routine healing: Secondary | ICD-10-CM | POA: Diagnosis not present

## 2023-03-10 DIAGNOSIS — I119 Hypertensive heart disease without heart failure: Secondary | ICD-10-CM | POA: Diagnosis present

## 2023-03-10 DIAGNOSIS — N400 Enlarged prostate without lower urinary tract symptoms: Secondary | ICD-10-CM | POA: Diagnosis not present

## 2023-03-10 DIAGNOSIS — R262 Difficulty in walking, not elsewhere classified: Secondary | ICD-10-CM | POA: Diagnosis not present

## 2023-03-10 DIAGNOSIS — R1312 Dysphagia, oropharyngeal phase: Secondary | ICD-10-CM | POA: Diagnosis not present

## 2023-03-10 DIAGNOSIS — D72829 Elevated white blood cell count, unspecified: Secondary | ICD-10-CM | POA: Diagnosis present

## 2023-03-10 DIAGNOSIS — W19XXXA Unspecified fall, initial encounter: Secondary | ICD-10-CM | POA: Diagnosis not present

## 2023-03-10 DIAGNOSIS — S79929A Unspecified injury of unspecified thigh, initial encounter: Secondary | ICD-10-CM | POA: Diagnosis not present

## 2023-03-10 DIAGNOSIS — Y92009 Unspecified place in unspecified non-institutional (private) residence as the place of occurrence of the external cause: Secondary | ICD-10-CM

## 2023-03-10 DIAGNOSIS — D509 Iron deficiency anemia, unspecified: Secondary | ICD-10-CM | POA: Diagnosis not present

## 2023-03-10 DIAGNOSIS — I6523 Occlusion and stenosis of bilateral carotid arteries: Secondary | ICD-10-CM | POA: Diagnosis not present

## 2023-03-10 DIAGNOSIS — I69354 Hemiplegia and hemiparesis following cerebral infarction affecting left non-dominant side: Secondary | ICD-10-CM | POA: Diagnosis not present

## 2023-03-10 DIAGNOSIS — R2689 Other abnormalities of gait and mobility: Secondary | ICD-10-CM | POA: Diagnosis not present

## 2023-03-10 DIAGNOSIS — M21372 Foot drop, left foot: Secondary | ICD-10-CM | POA: Diagnosis present

## 2023-03-10 DIAGNOSIS — J952 Acute pulmonary insufficiency following nonthoracic surgery: Secondary | ICD-10-CM | POA: Diagnosis not present

## 2023-03-10 DIAGNOSIS — I639 Cerebral infarction, unspecified: Secondary | ICD-10-CM | POA: Diagnosis not present

## 2023-03-10 DIAGNOSIS — M6259 Muscle wasting and atrophy, not elsewhere classified, multiple sites: Secondary | ICD-10-CM | POA: Diagnosis not present

## 2023-03-10 LAB — BASIC METABOLIC PANEL
Anion gap: 10 (ref 5–15)
BUN: 8 mg/dL (ref 8–23)
CO2: 25 mmol/L (ref 22–32)
Calcium: 8.6 mg/dL — ABNORMAL LOW (ref 8.9–10.3)
Chloride: 105 mmol/L (ref 98–111)
Creatinine, Ser: 0.67 mg/dL (ref 0.61–1.24)
GFR, Estimated: >60 mL/min (ref >60)
Glucose, Bld: 106 mg/dL — ABNORMAL HIGH (ref 70–99)
Potassium: 3.7 mmol/L (ref 3.5–5.1)
Sodium: 140 mmol/L (ref 135–145)

## 2023-03-10 LAB — CBC WITH DIFFERENTIAL/PLATELET
Abs Immature Granulocytes: 0.07 10*3/uL (ref 0.00–0.07)
Basophils Absolute: 0.1 10*3/uL (ref 0.0–0.1)
Basophils Relative: 0 %
Eosinophils Absolute: 0 10*3/uL (ref 0.0–0.5)
Eosinophils Relative: 0 %
HCT: 38.7 % — ABNORMAL LOW (ref 39.0–52.0)
Hemoglobin: 12.5 g/dL — ABNORMAL LOW (ref 13.0–17.0)
Immature Granulocytes: 0 %
Lymphocytes Relative: 15 %
Lymphs Abs: 2.4 10*3/uL (ref 0.7–4.0)
MCH: 28 pg (ref 26.0–34.0)
MCHC: 32.3 g/dL (ref 30.0–36.0)
MCV: 86.6 fL (ref 80.0–100.0)
Monocytes Absolute: 0.7 10*3/uL (ref 0.1–1.0)
Monocytes Relative: 4 %
Neutro Abs: 12.7 10*3/uL — ABNORMAL HIGH (ref 1.7–7.7)
Neutrophils Relative %: 81 %
Platelets: 125 10*3/uL — ABNORMAL LOW (ref 150–400)
RBC: 4.47 MIL/uL (ref 4.22–5.81)
RDW: 13.1 % (ref 11.5–15.5)
WBC: 16 10*3/uL — ABNORMAL HIGH (ref 4.0–10.5)
nRBC: 0 % (ref 0.0–0.2)

## 2023-03-10 LAB — URINALYSIS, ROUTINE W REFLEX MICROSCOPIC
Bilirubin Urine: NEGATIVE
Glucose, UA: NEGATIVE mg/dL
Hgb urine dipstick: NEGATIVE
Ketones, ur: NEGATIVE mg/dL
Leukocytes,Ua: NEGATIVE
Nitrite: NEGATIVE
Protein, ur: NEGATIVE mg/dL
Specific Gravity, Urine: 1.012 (ref 1.005–1.030)
pH: 7 (ref 5.0–8.0)

## 2023-03-10 LAB — TYPE AND SCREEN
ABO/RH(D): A POS
Antibody Screen: NEGATIVE

## 2023-03-10 LAB — SURGICAL PCR SCREEN
MRSA, PCR: POSITIVE — AB
Staphylococcus aureus: POSITIVE — AB

## 2023-03-10 LAB — PROTIME-INR
INR: 1.1 (ref 0.8–1.2)
Prothrombin Time: 14.5 seconds (ref 11.4–15.2)

## 2023-03-10 MED ORDER — ASPIRIN 81 MG PO TBEC
81.0000 mg | DELAYED_RELEASE_TABLET | Freq: Every day | ORAL | Status: DC
  Administered 2023-03-11 – 2023-03-14 (×4): 81 mg via ORAL
  Filled 2023-03-10 (×4): qty 1

## 2023-03-10 MED ORDER — MORPHINE SULFATE (PF) 2 MG/ML IV SOLN: 0.5000 mg | INTRAVENOUS | Status: DC | PRN

## 2023-03-10 MED ORDER — OXYCODONE HCL 5 MG PO TABS
5.0000 mg | ORAL_TABLET | ORAL | Status: DC | PRN
  Administered 2023-03-10: 5 mg via ORAL
  Administered 2023-03-10 – 2023-03-13 (×7): 10 mg via ORAL
  Filled 2023-03-10 (×5): qty 2
  Filled 2023-03-10: qty 1
  Filled 2023-03-10 (×2): qty 2

## 2023-03-10 MED ORDER — DOCUSATE SODIUM 100 MG PO CAPS
100.0000 mg | ORAL_CAPSULE | Freq: Every day | ORAL | Status: DC
  Administered 2023-03-10: 100 mg via ORAL
  Filled 2023-03-10 (×2): qty 1

## 2023-03-10 MED ORDER — BACLOFEN 10 MG PO TABS
10.0000 mg | ORAL_TABLET | Freq: Two times a day (BID) | ORAL | Status: DC
  Administered 2023-03-10 – 2023-03-14 (×7): 10 mg via ORAL
  Filled 2023-03-10 (×8): qty 1

## 2023-03-10 MED ORDER — HYDRALAZINE HCL 20 MG/ML IJ SOLN: 10.0000 mg | INTRAMUSCULAR | Status: DC | PRN

## 2023-03-10 MED ORDER — TAMSULOSIN HCL 0.4 MG PO CAPS
0.4000 mg | ORAL_CAPSULE | Freq: Every day | ORAL | Status: DC
  Administered 2023-03-10 – 2023-03-13 (×4): 0.4 mg via ORAL
  Filled 2023-03-10 (×4): qty 1

## 2023-03-10 MED ORDER — MUPIROCIN 2 % EX OINT
1.0000 | TOPICAL_OINTMENT | Freq: Two times a day (BID) | CUTANEOUS | Status: DC
  Administered 2023-03-10 – 2023-03-14 (×8): 1 via NASAL
  Filled 2023-03-10 (×2): qty 22

## 2023-03-10 MED ORDER — LISINOPRIL 5 MG PO TABS
2.5000 mg | ORAL_TABLET | Freq: Every day | ORAL | Status: DC
  Administered 2023-03-10 – 2023-03-14 (×5): 2.5 mg via ORAL
  Filled 2023-03-10 (×5): qty 1

## 2023-03-10 MED ORDER — OXYCODONE HCL 5 MG PO TABS
5.0000 mg | ORAL_TABLET | Freq: Once | ORAL | Status: AC
  Administered 2023-03-10: 5 mg via ORAL
  Filled 2023-03-10: qty 1

## 2023-03-10 MED ORDER — SENNOSIDES-DOCUSATE SODIUM 8.6-50 MG PO TABS
1.0000 | ORAL_TABLET | Freq: Every day | ORAL | Status: DC
  Administered 2023-03-10 – 2023-03-13 (×4): 1 via ORAL
  Filled 2023-03-10 (×4): qty 1

## 2023-03-10 MED ORDER — GABAPENTIN 300 MG PO CAPS
300.0000 mg | ORAL_CAPSULE | Freq: Every day | ORAL | Status: DC
  Administered 2023-03-10 – 2023-03-13 (×4): 300 mg via ORAL
  Filled 2023-03-10 (×4): qty 1

## 2023-03-10 MED ORDER — ENOXAPARIN SODIUM 40 MG/0.4ML IJ SOSY
40.0000 mg | PREFILLED_SYRINGE | INTRAMUSCULAR | Status: DC
  Administered 2023-03-10: 40 mg via SUBCUTANEOUS
  Filled 2023-03-10: qty 0.4

## 2023-03-10 MED ORDER — ATORVASTATIN CALCIUM 40 MG PO TABS
40.0000 mg | ORAL_TABLET | Freq: Every day | ORAL | Status: DC
  Administered 2023-03-10 – 2023-03-14 (×5): 40 mg via ORAL
  Filled 2023-03-10 (×5): qty 1

## 2023-03-10 MED ORDER — FENTANYL CITRATE PF 50 MCG/ML IJ SOSY
50.0000 ug | PREFILLED_SYRINGE | Freq: Once | INTRAMUSCULAR | Status: AC
  Administered 2023-03-10: 50 ug via INTRAVENOUS
  Filled 2023-03-10: qty 1

## 2023-03-10 NOTE — ED Provider Notes (Signed)
[place] EMERGENCY DEPARTMENT AT [facility] Provider Note   CSN: [id number] Arrival date & time: 03/10/23  0944     History  Chief Complaint  Patient presents with   Fall    Douglas Pruitt is a 72 y.o. male.  Patient is a 72 year old male with past medical history of hypertensive heart disease, HLD, prior CVA with residual left-sided deficits, presenting today after a fall.  Per family, patient is monitored on a healthcare when he is at home alone.  At approximately 812, per the camera he had a fall in the living room.  He appeared to lose his balance and fell onto his left side.  He was unable to get himself up due to his chronic left-sided weakness.  He was found approximately 15 minutes later.  He has not been complaining of pain in his left leg.  He has been unable to ambulate since that time.  Family states that they were able to see the fall on the camera and he did not hit his head.  He is not any blood thinners.  He denies any chest pain, shortness of breath, abdominal pain, neck pain, or back pain.     Home Medications Prior to Admission medications   Medication Sig Start Date End Date Taking? Authorizing Provider  acetaminophen (TYLENOL) 325 MG tablet Take 650 mg by mouth every 6 (six) hours as needed for moderate pain or headache. Patient not taking: Reported on 09/24/2022    [provider]  aspirin EC 81 MG tablet Take 1 tablet (81 mg total) by mouth daily. Swallow whole. 01/13/23 01/13/24  [name], FNP  atorvastatin (LIPITOR) 40 MG tablet TAKE 1 TABLET(40 MG) BY MOUTH AT BEDTIME 10/29/22   [name], FNP  baclofen (LIORESAL) 10 MG tablet TAKE 1 TABLET(10 MG) BY MOUTH TWICE DAILY 02/05/23   [name], FNP  docusate sodium (COLACE) 100 MG capsule Take 1 capsule (100 mg total) by mouth daily as needed. 09/24/22   [name], FNP  gabapentin (NEURONTIN) 300 MG capsule TAKE 1 CAPSULE(300 MG) BY MOUTH DAILY 02/05/23   [name], FNP  lisinopril  (ZESTRIL) 2.5 MG tablet TAKE 1 TABLET(2.5 MG) BY MOUTH DAILY 12/24/22   [name], FNP  tamsulosin (FLOMAX) 0.4 MG CAPS capsule Take 1 capsule (0.4 mg total) by mouth daily after supper. 09/24/22   [name], FNP      Allergies    Patient has no known allergies.    Review of Systems   Review of Systems Negative except for as noted above in the HPI  Physical Exam Updated Vital Signs BP (!) 156/71   Pulse 73   Temp 98.5 F (36.9 C) (Oral)   Resp 18   Ht 5\' 5"  (1.651 m)   Wt 63.5 kg   SpO2 100%   BMI 23.30 kg/m  Physical Exam Vitals and nursing note reviewed.  Constitutional:      General: He is not in acute distress.    Appearance: He is well-developed.  HENT:     Head: Normocephalic and atraumatic.  Eyes:     Conjunctiva/sclera: Conjunctivae normal.     Pupils: Pupils are equal, round, and reactive to light.  Neck:     Comments: No cervical spine tenderness Cardiovascular:     Rate and Rhythm: Normal rate and regular rhythm.     Heart sounds: No murmur heard. Pulmonary:     Effort: Pulmonary effort is normal. No respiratory distress.     Breath  sounds: Normal breath sounds.     Comments: Saturating well on room air Abdominal:     General: There is no distension.     Palpations: Abdomen is soft.     Tenderness: There is no abdominal tenderness. There is no guarding.  Musculoskeletal:     Cervical back: Neck supple.     Comments: No thoracic or lumbar spine tenderness to palpation.  Tenderness and edema appreciated to the proximal left thigh.  No large hematomas or bruising present.  2+ pedal pulses bilaterally.  Difficult to assess any decreased range of motion as patient has baseline weakness in the leg.  Pain is noted with any passive range of motion.  Skin:    General: Skin is warm and dry.     Capillary Refill: Capillary refill takes less than 2 seconds.     Findings: No rash.  Neurological:     Mental Status: He is alert.  Psychiatric:        Mood and  Affect: Mood normal.     ED Results / Procedures / Treatments   Labs (all labs ordered are listed, but only abnormal results are displayed) Labs Reviewed  BASIC METABOLIC PANEL - Abnormal; Notable for the following components:      Result Value   Glucose, Bld 106 (*)    Calcium 8.6 (*)    All other components within normal limits  CBC WITH DIFFERENTIAL/PLATELET - Abnormal; Notable for the following components:   WBC 16.0 (*)    Hemoglobin 12.5 (*)    HCT 38.7 (*)    Platelets 125 (*)    Neutro Abs 12.7 (*)    All other components within normal limits  SURGICAL PCR SCREEN  PROTIME-INR  URINALYSIS, ROUTINE W REFLEX MICROSCOPIC  CBC  BASIC METABOLIC PANEL  TYPE AND SCREEN    EKG EKG Interpretation Date/Time:  Monday March 10 2023 09:59:25 EDT Ventricular Rate:  66 PR Interval:  170 QRS Duration:  90 QT Interval:  411 QTC Calculation: 431 R Axis:   62  Text Interpretation: Sinus rhythm Abnormal R-wave progression, early transition ST elevation, consider inferior injury poor data quality.  no obvious infarct Confirmed by Eber Hong ([id number]) on 03/10/2023 10:10:01 AM  Radiology DG Chest 1 View  Result Date: 03/10/2023 CLINICAL DATA:  [id number] Fall [id number] EXAM: CHEST  1 VIEW COMPARISON:  CXR 10/29/21 FINDINGS: No pleural effusion. No pneumothorax. No focal airspace opacity. Normal cardiac and mediastinal contours. No radiographically apparent displaced rib fractures. Visualized upper abdomen is unremarkable. IMPRESSION: No focal airspace opacity Electronically Signed   By: Lorenza Cambridge M.D.   On: 03/10/2023 12:07   DG Pelvis 1-2 Views  Result Date: 03/10/2023 CLINICAL DATA:  Fall with left hip pain EXAM: PELVIS - 1-2 VIEW COMPARISON:  Femur radiography. FINDINGS: Inter trochanteric fracture of the left femur with lateral angulation. Bones of the pelvis are intact. Right hip is normal. IMPRESSION: Intertrochanteric fracture of the left femur with lateral angulation.  Electronically Signed   By: Paulina Fusi M.D.   On: 03/10/2023 12:06   DG Femur Min 2 Views Left  Result Date: 03/10/2023 CLINICAL DATA:  Fall with pain EXAM: LEFT FEMUR 2 VIEWS COMPARISON:  None Available. FINDINGS: Intertrochanteric fracture of the femur with lateral angulation. No fracture seen distal to that. IMPRESSION: Intertrochanteric fracture of the femur with lateral angulation. Electronically Signed   By: Paulina Fusi M.D.   On: 03/10/2023 12:06      Medications Ordered in ED Medications  morphine (PF) 2 MG/ML injection 0.5 mg (has no administration in time range)  enoxaparin (LOVENOX) injection 40 mg (40 mg Subcutaneous Given 03/10/23 1603)  oxyCODONE (Oxy IR/ROXICODONE) immediate release tablet 5-10 mg (5 mg Oral Given 03/10/23 1603)  senna-docusate (Senokot-S) tablet 1 tablet (has no administration in time range)  atorvastatin (LIPITOR) tablet 40 mg (40 mg Oral Given 03/10/23 1603)  lisinopril (ZESTRIL) tablet 2.5 mg (2.5 mg Oral Given 03/10/23 1603)  docusate sodium (COLACE) capsule 100 mg (100 mg Oral Given 03/10/23 1603)  tamsulosin (FLOMAX) capsule 0.4 mg (has no administration in time range)  gabapentin (NEURONTIN) capsule 300 mg (has no administration in time range)  baclofen (LIORESAL) tablet 10 mg (has no administration in time range)  aspirin EC tablet 81 mg (has no administration in time range)  hydrALAZINE (APRESOLINE) injection 10 mg (has no administration in time range)  mupirocin ointment (BACTROBAN) 2 % 1 Application (has no administration in time range)  oxyCODONE (Oxy IR/ROXICODONE) immediate release tablet 5 mg (5 mg Oral Given 03/10/23 1034)  fentaNYL (SUBLIMAZE) injection 50 mcg (50 mcg Intravenous Given 03/10/23 1318)    ED Course/ Medical Decision Making/ A&P                                Medical Decision Making Problems Addressed: Closed fracture of left hip, initial encounter [facility]): complicated acute illness or injury  Amount and/or Complexity of  Data Reviewed Independent Historian: caregiver    Details: family Labs: ordered. Radiology: ordered and independent interpretation performed. ECG/medicine tests: ordered and independent interpretation performed.  Risk Prescription drug management. Decision regarding hospitalization.    Gustavo Kujawa is a 72 y.o. male with PMH of hypertensive heart disease, HLD, prior CVA with residual left-sided deficits, who presented to the ED after a ground level fall with left leg pain.  ABCs intact. GCS 15.  Afebrile, hemodynamically stable. PE as below, notable for edema and tenderness noted to the left proximal thigh.   X-rays of the chest, left hip, and left femur were performed, significant findings include intertrochanteric fracture of the left femur with lateral angulation. No further evidence of trauma is seen on exam, therefore no further imaging deemed necessary at this time. Additionally, family notes that patient lost his balance while walking related to his known left sided weakness. He has not been ill recently. Fall seems to be mechanical in nature rather than due to syncope or near syncope.   Patient given IV pain medication.    EKG reveals sinus rhythm at 66 bpm.  No obvious ST or T wave abnormalities however quality is poor.  Laboratory studies obtained, significant findings include leukocytosis 16 (likely reactive).    Case initially discussed with orthopedics.  Per their note, will plan for surgery tomorrow.  Case then discussed with hospitalist team who will admit for further care and monitoring while awaiting surgery tomorrow.  The plan for this patient was discussed with Dr. Hyacinth Meeker, who voiced agreement and who oversaw evaluation and treatment of this patient.    Final Clinical Impression(s) / ED Diagnoses Final diagnoses:  Closed fracture of left hip, initial encounter [facility])    Rx / DC Orders ED Discharge Orders     None         Caisen, Mundie, DO 03/10/23  1702    Eber Hong, MD 03/11/23 1228

## 2023-03-10 NOTE — ED Triage Notes (Signed)
Pt takes baby Asprin. Pt has not had any of his medications today

## 2023-03-10 NOTE — ED Notes (Signed)
ED TO INPATIENT HANDOFF REPORT  ED Nurse Name and Phone #: Sharia Reeve  S Name/Age/Gender Nashon Cali 72 y.o. male Room/Bed: 016C/016C  Code Status   Code Status: Full Code  Home/SNF/Other Home Patient oriented to: self, place, time, and situation Is this baseline? Yes   Triage Complete: Triage complete  Chief Complaint Closed intertrochanteric fracture of hip, left, initial encounter [facility]) [S72.142A]  Triage Note Pt had a fall this morning at aboput 810. Fall was when he was alone but family saw the camera. Pt had a previous stroke  with left sided weakness pt fell to the left side. Pt ambulates with a cane and was using it when he fell . Pt is complaining of pain in his whole left side. Family denies hitting head . Family denies loc.   Pt takes baby Asprin. Pt has not had any of his medications today    Allergies No Known Allergies  Level of Care/Admitting Diagnosis ED Disposition     ED Disposition  Admit   Condition  --   Comment  Hospital Area: [facility] [[id number]]  Level of Care: Telemetry Surgical [105]  May admit patient to [facility] or [facility] if equivalent level of care is available:: No  Covid Evaluation: Asymptomatic - no recent exposure (last 10 days) testing not required  Diagnosis: Closed intertrochanteric fracture of hip, left, initial encounter [facility]) [[id number]]  Admitting Physician: Clydie Braun [[id number]]  Attending Physician: Clydie Braun [[id number]]  Certification:: I certify this patient will need inpatient services for at least 2 midnights  Estimated Length of Stay: 2          B Medical/Surgery History Past Medical History:  Diagnosis Date   Acute CVA (cerebrovascular accident) (HCC) 10/30/2021   CVA (cerebral vascular accident) (HCC) 10/22/2021   Hyperlipidemia    Hypertension    Past Surgical History:  Procedure Laterality Date   EYE SURGERY     EYE SURGERY     work injury, left eye, blind in left eye      A IV Location/Drains/Wounds Patient Lines/Drains/Airways Status     Active Line/Drains/Airways     Name Placement date Placement time Site Days   Peripheral IV 03/10/23 20 G Proximal;Right;Lateral;Anterior Forearm 03/10/23  1237  Forearm  less than 1            Intake/Output Last 24 hours No intake or output data in the 24 hours ending 03/10/23 1505  Labs/Imaging Results for orders placed or performed during the hospital encounter of 03/10/23 (from the past 48 hour(s))  Basic metabolic panel     Status: Abnormal   Collection Time: 03/10/23 12:16 PM  Result Value Ref Range   Sodium 140 135 - 145 mmol/L   Potassium 3.7 3.5 - 5.1 mmol/L   Chloride 105 98 - 111 mmol/L   CO2 25 22 - 32 mmol/L   Glucose, Bld 106 (H) 70 - 99 mg/dL    Comment: Glucose reference range applies only to samples taken after fasting for at least 8 hours.   BUN 8 8 - 23 mg/dL   Creatinine, Ser 4.69 0.61 - 1.24 mg/dL   Calcium 8.6 (L) 8.9 - 10.3 mg/dL   GFR, Estimated >62 >95 mL/min    Comment: (NOTE) Calculated using the CKD-EPI Creatinine Equation (2021)    Anion gap 10 5 - 15    Comment: Performed at [facility], [address], [place], [place] [address]  CBC with Differential  Status: Abnormal   Collection Time: 03/10/23 12:16 PM  Result Value Ref Range   WBC 16.0 (H) 4.0 - 10.5 K/uL   RBC 4.47 4.22 - 5.81 MIL/uL   Hemoglobin 12.5 (L) 13.0 - 17.0 g/dL   HCT 95.6 (L) 21.3 - 08.6 %   MCV 86.6 80.0 - 100.0 fL   MCH 28.0 26.0 - 34.0 pg   MCHC 32.3 30.0 - 36.0 g/dL   RDW 57.8 46.9 - 62.9 %   Platelets 125 (L) 150 - 400 K/uL    Comment: REPEATED TO VERIFY   nRBC 0.0 0.0 - 0.2 %   Neutrophils Relative % 81 %   Neutro Abs 12.7 (H) 1.7 - 7.7 K/uL   Lymphocytes Relative 15 %   Lymphs Abs 2.4 0.7 - 4.0 K/uL   Monocytes Relative 4 %   Monocytes Absolute 0.7 0.1 - 1.0 K/uL   Eosinophils Relative 0 %   Eosinophils Absolute 0.0 0.0 - 0.5 K/uL   Basophils Relative 0 %   Basophils  Absolute 0.1 0.0 - 0.1 K/uL   Immature Granulocytes 0 %   Abs Immature Granulocytes 0.07 0.00 - 0.07 K/uL    Comment: Performed at [facility], [address], [place], [place] [address]  Protime-INR     Status: None   Collection Time: 03/10/23 12:16 PM  Result Value Ref Range   Prothrombin Time 14.5 11.4 - 15.2 seconds   INR 1.1 0.8 - 1.2    Comment: (NOTE) INR goal varies based on device and disease states. Performed at [facility], [address] [place], [place] [address]   Type and screen [facility]     Status: None   Collection Time: 03/10/23 12:16 PM  Result Value Ref Range   ABO/RH(D) A POS    Antibody Screen NEG    Sample Expiration      03/13/2023,2359 Performed at [facility], [address] [place], [place] [address]    DG Chest 1 View  Result Date: 03/10/2023 CLINICAL DATA:  [id number] Fall [id number] EXAM: CHEST  1 VIEW COMPARISON:  CXR 10/29/21 FINDINGS: No pleural effusion. No pneumothorax. No focal airspace opacity. Normal cardiac and mediastinal contours. No radiographically apparent displaced rib fractures. Visualized upper abdomen is unremarkable. IMPRESSION: No focal airspace opacity Electronically Signed   By: Lorenza Cambridge M.D.   On: 03/10/2023 12:07   DG Pelvis 1-2 Views  Result Date: 03/10/2023 CLINICAL DATA:  Fall with left hip pain EXAM: PELVIS - 1-2 VIEW COMPARISON:  Femur radiography. FINDINGS: Inter trochanteric fracture of the left femur with lateral angulation. Bones of the pelvis are intact. Right hip is normal. IMPRESSION: Intertrochanteric fracture of the left femur with lateral angulation. Electronically Signed   By: Paulina Fusi M.D.   On: 03/10/2023 12:06   DG Femur Min 2 Views Left  Result Date: 03/10/2023 CLINICAL DATA:  Fall with pain EXAM: LEFT FEMUR 2 VIEWS COMPARISON:  None Available. FINDINGS: Intertrochanteric fracture of the femur with lateral angulation. No fracture seen distal to that. IMPRESSION:  Intertrochanteric fracture of the femur with lateral angulation. Electronically Signed   By: Paulina Fusi M.D.   On: 03/10/2023 12:06    Pending Labs Unresulted Labs (From admission, onward)     Start     Ordered   03/11/23 0500  CBC  Tomorrow morning,   R        03/10/23 1433   03/11/23 0500  Basic metabolic panel  Tomorrow morning,   R        03/10/23 1433            Vitals/Pain Today's Vitals   03/10/23 1330 03/10/23 1345 03/10/23 1400 03/10/23 1453  BP: (!) 167/80 (!) 162/80 (!) 157/76 (!) 146/73  Pulse: 90 88 88 92  Resp: 16 18 18  (!) 22  Temp:    98.3 F (36.8 C)  TempSrc:    Oral  SpO2: 100% 100% 100% 100%  Weight:      Height:      PainSc:        Isolation Precautions No active isolations  Medications Medications  morphine (PF) 2 MG/ML injection 0.5 mg (has no administration in time range)  enoxaparin (LOVENOX) injection 40 mg (has no administration in time range)  oxyCODONE (Oxy IR/ROXICODONE) immediate release tablet 5-10 mg (has no administration in time range)  senna-docusate (Senokot-S) tablet 1 tablet (has no administration in time range)  oxyCODONE (Oxy IR/ROXICODONE) immediate release tablet 5 mg (5 mg Oral Given 03/10/23 1034)  fentaNYL (SUBLIMAZE) injection 50 mcg (50 mcg Intravenous Given 03/10/23 1318)    Mobility walks with device     Focused Assessments     R Recommendations: See Admitting Provider Note  Report given to:   Additional Notes: speaks a little broken english.  Daughter has been helping to translate [place]. She left but will be coming back.

## 2023-03-10 NOTE — H&P (Signed)
History and Physical    Patient: Douglas Pruitt EPP:[id number] DOB: 01-13-1951 DOA: 03/10/2023 DOS: the patient was seen and examined on 03/10/2023 PCP: [name], FNP  Patient coming from: Home via EMS  Chief Complaint:  Chief Complaint  Patient presents with   Fall   HPI: Douglas Pruitt is a 72 y.o. Montagnard speaking male with medical history significant of hypertension, hyperlipidemia, carotid artery occlusion, and CVA with residual left hemiparesis who presents after having a fall at home with left hip pain.  History is obtained with the use of interpreter services.  He normally gets around with use of a cane.  This morning he had gotten up to use the restroom when he lost his balance and fell onto his left side.  Denied any loss of consciousness or trauma to his head.  He had tried to get up, but was unable to due to the pain.  In the emergency department patient was noted to be afebrile with blood pressures elevated up to 167 over 80, and all other vital signs maintained.  Labs significant for WBC 16, hemoglobin 12.5, and platelets 125. X-rays revealed intertrochanteric fracture of the left femur with lateral angulation.  Orthopedics have been consulted.  Patient had been given oxycodone IR and fentanyl IV.  Review of Systems: As mentioned in the history of present illness. All other systems reviewed and are negative. Past Medical History:  Diagnosis Date   Acute CVA (cerebrovascular accident) (HCC) 10/30/2021   CVA (cerebral vascular accident) (HCC) 10/22/2021   Hyperlipidemia    Hypertension    Past Surgical History:  Procedure Laterality Date   EYE SURGERY     EYE SURGERY     work injury, left eye, blind in left eye   Social History:  reports that he has never smoked. He has never used smokeless tobacco. He reports that he does not drink alcohol and does not use drugs.  No Known Allergies  History reviewed. No pertinent family history.  Prior to Admission medications    Medication Sig Start Date End Date Taking? Authorizing Provider  acetaminophen (TYLENOL) 325 MG tablet Take 650 mg by mouth every 6 (six) hours as needed for moderate pain or headache. Patient not taking: Reported on 09/24/2022    [provider]  aspirin EC 81 MG tablet Take 1 tablet (81 mg total) by mouth daily. Swallow whole. 01/13/23 01/13/24  [name], FNP  atorvastatin (LIPITOR) 40 MG tablet TAKE 1 TABLET(40 MG) BY MOUTH AT BEDTIME 10/29/22   [name], FNP  baclofen (LIORESAL) 10 MG tablet TAKE 1 TABLET(10 MG) BY MOUTH TWICE DAILY 02/05/23   [name], FNP  docusate sodium (COLACE) 100 MG capsule Take 1 capsule (100 mg total) by mouth daily as needed. 09/24/22   [name], FNP  gabapentin (NEURONTIN) 300 MG capsule TAKE 1 CAPSULE(300 MG) BY MOUTH DAILY 02/05/23   [name], FNP  lisinopril (ZESTRIL) 2.5 MG tablet TAKE 1 TABLET(2.5 MG) BY MOUTH DAILY 12/24/22   [name], FNP  tamsulosin (FLOMAX) 0.4 MG CAPS capsule Take 1 capsule (0.4 mg total) by mouth daily after supper. 09/24/22   [name], FNP    Physical Exam: Vitals:   03/10/23 1315 03/10/23 1330 03/10/23 1345 03/10/23 1400  BP: (!) 166/74 (!) 167/80 (!) 162/80 (!) 157/76  Pulse: 84 90 88 88  Resp: 18 16 18 18   Temp:      TempSrc:      SpO2: 100% 100% 100% 100%  Weight:  Height:       Constitutional: Elderly male currently in NAD, calm, comfortable Eyes: Extraocular movements intact.  Vision loss in left eye. ENMT: Mucous membranes are moist.   Neck: normal, supple Respiratory: clear to auscultation bilaterally, no wheezing, no crackles. Normal respiratory effort.  .  Cardiovascular: Regular rate and rhythm, no murmurs / rubs / gallops. No extremity edema. 2+ pedal pulses.   Abdomen: no tenderness, no masses palpated.  Bowel sounds positive.  Musculoskeletal: no clubbing / cyanosis. No joint deformity upper and lower extremities. Good ROM, no contractures. Normal muscle tone.  Skin: no  rashes, lesions, ulcers.  Patient has tattoos of the right thigh and chest. Neurologic: CN 2-12 grossly intact.  Left-sided hemiparesis. Psychiatric: Normal judgment and insight. Alert and oriented x 3. Normal mood.   Data Reviewed:  EKG reveals sinus rhythm at 66 bpm with ST wave changes, but of poor quality.  Assessment and Plan:  Intertrochanteric fracture of the left femur secondary to fall at home Patient presents after having a fall at home. Found to have a intertrochanteric left femur fracture.  Orthopedics consulted with plans on taking patient for surgery for intramedullary nailing in a.m. -Admit to a medical surgical bed -Hip fracture order set utilized -Oxycodone/morphine IV as needed for moderate to severe pain -Colace scheduled daily -Orthopedics consulted we will follow-up for any further recommendations  Leukocytosis Acute.  Initial WBC elevated at 16.  Chest x-ray showed no acute abnormality.  Suspect secondary to acute fracture. -Check urinalysis -Recheck CBC tomorrow morning  Normocytic anemia Hemoglobin 12.5 g/dL.  No reports of bleeding. -Continue to monitor  Thrombocytopenia Acute.  Platelet count 125.  Platelet count had not been low previously.  No  Essential hypertension Blood pressures initially elevated up to 167/80.  Home blood pressure regimen include lisinopril 2.5 mg daily. -Continue lisinopril -Hydralazine IV as needed  History of CVA with residual deficit Patient with prior history of CVA secondary to internal carotid arteries occlusion in 2023. -Continue aspirin and statin  Hyperlipidemia -Continue atorvastatin  BPH -Continue tamsulosin  DVT prophylaxis: Lovenox.  But continue to monitor platelet count Advance Care Planning:   Code Status: Full Code confirmed by the patient for surgical procedure.  Previously noted to have DNR Consults: Orthopedics  Family Communication: Daughter updated over the phone  Severity of Illness: The  appropriate patient status for this patient is INPATIENT. Inpatient status is judged to be reasonable and necessary in order to provide the required intensity of service to ensure the patient's safety. The patient's presenting symptoms, physical exam findings, and initial radiographic and laboratory data in the context of their chronic comorbidities is felt to place them at high risk for further clinical deterioration. Furthermore, it is not anticipated that the patient will be medically stable for discharge from the hospital within 2 midnights of admission.   * I certify that at the point of admission it is my clinical judgment that the patient will require inpatient hospital care spanning beyond 2 midnights from the point of admission due to high intensity of service, high risk for further deterioration and high frequency of surveillance required.*  Author: Clydie Braun, MD 03/10/2023 2:13 PM  For on call review [email or web address].

## 2023-03-10 NOTE — ED Triage Notes (Signed)
Pt had a fall this morning at aboput 810. Fall was when he was alone but family saw the camera. Pt had a previous stroke  with left sided weakness pt fell to the left side. Pt ambulates with a cane and was using it when he fell . Pt is complaining of pain in his whole left side. Family denies hitting head . Family denies loc.

## 2023-03-10 NOTE — Consult Note (Signed)
Reason for Consult:Left hip fx Referring Physician: Eber Hong Time called: 1213 Time at bedside: 1230   Douglas Pruitt is Douglas Pruitt 72 y.o. male.  HPI: Douglas Pruitt lost his balance and fell this morning at home. He had immediate left hip pain and could not get up. He was brought to the ED where x-rays showed a left hip fx and orthopedic surgery was consulted. He lives at home with family and uses a cane to ambulate 2/2 left hemiparesis from a CVA.  Past Medical History:  Diagnosis Date   Acute CVA (cerebrovascular accident) (HCC) 10/30/2021   CVA (cerebral vascular accident) (HCC) 10/22/2021   Hyperlipidemia    Hypertension     Past Surgical History:  Procedure Laterality Date   EYE SURGERY     EYE SURGERY     work injury, left eye, blind in left eye    History reviewed. No pertinent family history.  Social History:  reports that he has never smoked. He has never used smokeless tobacco. He reports that he does not drink alcohol and does not use drugs.  Allergies: No Known Allergies  Medications: I have reviewed the patient's current medications.  Results for orders placed or performed during the hospital encounter of 03/10/23 (from the past 48 hour(s))  Type and screen [facility]     Status: None (Preliminary result)   Collection Time: 03/10/23 12:16 PM  Result Value Ref Range   ABO/RH(D) PENDING    Antibody Screen PENDING    Sample Expiration      03/13/2023,2359 Performed at [facility], [address], [place], [place] [address]     DG Chest 1 View  Result Date: 03/10/2023 CLINICAL DATA:  [id number] Fall [id number] EXAM: CHEST  1 VIEW COMPARISON:  CXR 10/29/21 FINDINGS: No pleural effusion. No pneumothorax. No focal airspace opacity. Normal cardiac and mediastinal contours. No radiographically apparent displaced rib fractures. Visualized upper abdomen is unremarkable. IMPRESSION: No focal airspace opacity Electronically Signed   By: Lorenza Cambridge M.D.   On: 03/10/2023  12:07   DG Pelvis 1-2 Views  Result Date: 03/10/2023 CLINICAL DATA:  Fall with left hip pain EXAM: PELVIS - 1-2 VIEW COMPARISON:  Femur radiography. FINDINGS: Inter trochanteric fracture of the left femur with lateral angulation. Bones of the pelvis are intact. Right hip is normal. IMPRESSION: Intertrochanteric fracture of the left femur with lateral angulation. Electronically Signed   By: Paulina Fusi M.D.   On: 03/10/2023 12:06   DG Femur Min 2 Views Left  Result Date: 03/10/2023 CLINICAL DATA:  Fall with pain EXAM: LEFT FEMUR 2 VIEWS COMPARISON:  None Available. FINDINGS: Intertrochanteric fracture of the femur with lateral angulation. No fracture seen distal to that. IMPRESSION: Intertrochanteric fracture of the femur with lateral angulation. Electronically Signed   By: Paulina Fusi M.D.   On: 03/10/2023 12:06    Review of Systems  HENT:  Negative for ear discharge, ear pain, hearing loss and tinnitus.   Eyes:  Negative for photophobia and pain.  Respiratory:  Negative for cough and shortness of breath.   Cardiovascular:  Negative for chest pain.  Gastrointestinal:  Negative for abdominal pain, nausea and vomiting.  Genitourinary:  Negative for dysuria, flank pain, frequency and urgency.  Musculoskeletal:  Positive for arthralgias (Left hip). Negative for back pain, myalgias and neck pain.  Neurological:  Negative for dizziness and headaches.  Hematological:  Does not bruise/bleed easily.  Psychiatric/Behavioral:  The patient is not nervous/anxious.    Blood pressure [name])  153/61, pulse 66, temperature 97.9 F (36.6 C), temperature source Oral, resp. rate 12, height 5\' 5"  (1.651 m), weight 63.5 kg, SpO2 100%. Physical Exam Constitutional:      General: He is not in acute distress.    Appearance: He is well-developed. He is not diaphoretic.  HENT:     Head: Normocephalic and atraumatic.  Eyes:     General: No scleral icterus.       Right eye: No discharge.        Left eye: No  discharge.     Conjunctiva/sclera: Conjunctivae normal.  Cardiovascular:     Rate and Rhythm: Normal rate and regular rhythm.  Pulmonary:     Effort: Pulmonary effort is normal. No respiratory distress.  Musculoskeletal:     Cervical back: Normal range of motion.     Comments: LLE No traumatic wounds, ecchymosis, or rash  Mild TTP hip  No knee or ankle effusion  Knee stable to varus/ valgus and anterior/posterior stress  Sens DPN, SPN, TN intact  Motor EHL, ext, flex, evers 5/5  DP 2+, PT 2+, No significant edema  Skin:    General: Skin is warm and dry.  Neurological:     Mental Status: He is alert.  Psychiatric:        Mood and Affect: Mood normal.        Behavior: Behavior normal.    Assessment/Plan: Left hip fx -- Plan IMN tomorrow with Dr. Carola Frost. Please keep NPO after MN.    Freeman Caldron, PA-C Orthopedic Surgery [phone] 03/10/2023, 12:39 PM

## 2023-03-10 NOTE — Plan of Care (Signed)

## 2023-03-11 ENCOUNTER — Inpatient Hospital Stay (HOSPITAL_COMMUNITY): Payer: Medicare Other | Admitting: Certified Registered Nurse Anesthetist

## 2023-03-11 ENCOUNTER — Inpatient Hospital Stay (HOSPITAL_COMMUNITY): Payer: Medicare Other

## 2023-03-11 ENCOUNTER — Encounter (HOSPITAL_COMMUNITY)
Admission: EM | Disposition: A | Discharge status: Skilled Nursing Facility | Payer: Self-pay | Source: Home / Self Care | Attending: Internal Medicine

## 2023-03-11 ENCOUNTER — Other Ambulatory Visit: Payer: Self-pay

## 2023-03-11 ENCOUNTER — Encounter (HOSPITAL_COMMUNITY): Payer: Self-pay | Admitting: Internal Medicine

## 2023-03-11 DIAGNOSIS — I679 Cerebrovascular disease, unspecified: Secondary | ICD-10-CM

## 2023-03-11 DIAGNOSIS — S72142A Displaced intertrochanteric fracture of left femur, initial encounter for closed fracture: Secondary | ICD-10-CM | POA: Diagnosis not present

## 2023-03-11 DIAGNOSIS — E782 Mixed hyperlipidemia: Secondary | ICD-10-CM

## 2023-03-11 DIAGNOSIS — I10 Essential (primary) hypertension: Secondary | ICD-10-CM | POA: Diagnosis not present

## 2023-03-11 HISTORY — PX: INTRAMEDULLARY (IM) NAIL INTERTROCHANTERIC: SHX5875

## 2023-03-11 LAB — IRON AND TIBC
Iron: 19 ug/dL — ABNORMAL LOW (ref 45–182)
Saturation Ratios: 8 % — ABNORMAL LOW (ref 17.9–39.5)
TIBC: 230 ug/dL — ABNORMAL LOW (ref 250–450)
UIBC: 211 ug/dL

## 2023-03-11 LAB — FERRITIN: Ferritin: 228 ng/mL (ref 24–336)

## 2023-03-11 LAB — HIV ANTIBODY (ROUTINE TESTING W REFLEX): HIV Screen 4th Generation wRfx: NONREACTIVE

## 2023-03-11 SURGERY — FIXATION, FRACTURE, INTERTROCHANTERIC, WITH INTRAMEDULLARY ROD
Anesthesia: General | Laterality: Left

## 2023-03-11 MED ORDER — EPHEDRINE SULFATE-NACL 50-0.9 MG/10ML-% IV SOSY
PREFILLED_SYRINGE | INTRAVENOUS | Status: DC | PRN
  Administered 2023-03-11: 10 mg via INTRAVENOUS

## 2023-03-11 MED ORDER — LIDOCAINE 2% (20 MG/ML) 5 ML SYRINGE
INTRAMUSCULAR | Status: DC | PRN
  Administered 2023-03-11: 60 mg via INTRAVENOUS

## 2023-03-11 MED ORDER — VANCOMYCIN HCL IN DEXTROSE 1-5 GM/200ML-% IV SOLN
INTRAVENOUS | Status: AC
  Filled 2023-03-11: qty 200

## 2023-03-11 MED ORDER — DOCUSATE SODIUM 100 MG PO CAPS
100.0000 mg | ORAL_CAPSULE | Freq: Two times a day (BID) | ORAL | Status: DC
  Administered 2023-03-11 – 2023-03-14 (×7): 100 mg via ORAL
  Filled 2023-03-11 (×6): qty 1

## 2023-03-11 MED ORDER — ORAL CARE MOUTH RINSE: 15.0000 mL | Freq: Once | OROMUCOSAL | Status: AC

## 2023-03-11 MED ORDER — MENTHOL 3 MG MT LOZG: 1.0000 | LOZENGE | OROMUCOSAL | Status: DC | PRN

## 2023-03-11 MED ORDER — METOCLOPRAMIDE HCL 5 MG PO TABS: 5.0000 mg | ORAL_TABLET | Freq: Three times a day (TID) | ORAL | Status: DC | PRN

## 2023-03-11 MED ORDER — 0.9 % SODIUM CHLORIDE (POUR BTL) OPTIME
TOPICAL | Status: DC | PRN
  Administered 2023-03-11: 1000 mL

## 2023-03-11 MED ORDER — VANCOMYCIN HCL 1000 MG IV SOLR: INTRAVENOUS | Status: DC | PRN

## 2023-03-11 MED ORDER — EPHEDRINE 5 MG/ML INJ
INTRAVENOUS | Status: AC
  Filled 2023-03-11: qty 5

## 2023-03-11 MED ORDER — PHENYLEPHRINE HCL-NACL 20-0.9 MG/250ML-% IV SOLN
INTRAVENOUS | Status: DC | PRN
  Administered 2023-03-11: 25 ug/min via INTRAVENOUS

## 2023-03-11 MED ORDER — PROPOFOL 10 MG/ML IV BOLUS
INTRAVENOUS | Status: AC
  Filled 2023-03-11: qty 20

## 2023-03-11 MED ORDER — ROCURONIUM BROMIDE 10 MG/ML (PF) SYRINGE
PREFILLED_SYRINGE | INTRAVENOUS | Status: DC | PRN
  Administered 2023-03-11: 50 mg via INTRAVENOUS
  Administered 2023-03-11: 20 mg via INTRAVENOUS

## 2023-03-11 MED ORDER — PHENOL 1.4 % MT LIQD: 1.0000 | OROMUCOSAL | Status: DC | PRN

## 2023-03-11 MED ORDER — LIDOCAINE 2% (20 MG/ML) 5 ML SYRINGE
INTRAMUSCULAR | Status: AC
  Filled 2023-03-11: qty 5

## 2023-03-11 MED ORDER — LACTATED RINGERS IV SOLN: INTRAVENOUS | Status: DC

## 2023-03-11 MED ORDER — CEFAZOLIN SODIUM-DEXTROSE 2-4 GM/100ML-% IV SOLN
2.0000 g | Freq: Four times a day (QID) | INTRAVENOUS | Status: AC
  Administered 2023-03-11 (×2): 2 g via INTRAVENOUS
  Filled 2023-03-11 (×2): qty 100

## 2023-03-11 MED ORDER — FENTANYL CITRATE (PF) 250 MCG/5ML IJ SOLN
INTRAMUSCULAR | Status: AC
  Filled 2023-03-11: qty 5

## 2023-03-11 MED ORDER — CHLORHEXIDINE GLUCONATE 0.12 % MT SOLN
OROMUCOSAL | Status: AC
  Administered 2023-03-11: 15 mL via OROMUCOSAL
  Filled 2023-03-11: qty 15

## 2023-03-11 MED ORDER — CHLORHEXIDINE GLUCONATE 4 % EX SOLN: 60.0000 mL | Freq: Once | CUTANEOUS | Status: DC

## 2023-03-11 MED ORDER — DEXAMETHASONE SODIUM PHOSPHATE 10 MG/ML IJ SOLN
INTRAMUSCULAR | Status: DC | PRN
  Administered 2023-03-11: 5 mg via INTRAVENOUS

## 2023-03-11 MED ORDER — MUPIROCIN 2 % EX OINT: 1.0000 | TOPICAL_OINTMENT | Freq: Two times a day (BID) | CUTANEOUS | 0 refills | Status: AC

## 2023-03-11 MED ORDER — PHENYLEPHRINE 80 MCG/ML (10ML) SYRINGE FOR IV PUSH (FOR BLOOD PRESSURE SUPPORT)
PREFILLED_SYRINGE | INTRAVENOUS | Status: DC | PRN
  Administered 2023-03-11: 80 ug via INTRAVENOUS
  Administered 2023-03-11 (×2): 160 ug via INTRAVENOUS
  Administered 2023-03-11 (×2): 80 ug via INTRAVENOUS

## 2023-03-11 MED ORDER — TRANEXAMIC ACID-NACL 1000-0.7 MG/100ML-% IV SOLN
1000.0000 mg | Freq: Once | INTRAVENOUS | Status: AC
  Administered 2023-03-11: 1000 mg via INTRAVENOUS
  Filled 2023-03-11: qty 100

## 2023-03-11 MED ORDER — CEFAZOLIN SODIUM-DEXTROSE 2-4 GM/100ML-% IV SOLN
2.0000 g | INTRAVENOUS | Status: AC
  Administered 2023-03-11: 2 g via INTRAVENOUS
  Filled 2023-03-11: qty 100

## 2023-03-11 MED ORDER — ONDANSETRON HCL 4 MG PO TABS: 4.0000 mg | ORAL_TABLET | Freq: Four times a day (QID) | ORAL | Status: DC | PRN

## 2023-03-11 MED ORDER — CHLORHEXIDINE GLUCONATE 4 % EX SOLN: 1.0000 | CUTANEOUS | 1 refills | Status: AC

## 2023-03-11 MED ORDER — ENOXAPARIN SODIUM 40 MG/0.4ML IJ SOSY
40.0000 mg | PREFILLED_SYRINGE | INTRAMUSCULAR | Status: DC
  Administered 2023-03-12 – 2023-03-14 (×3): 40 mg via SUBCUTANEOUS
  Filled 2023-03-11 (×3): qty 0.4

## 2023-03-11 MED ORDER — ADULT MULTIVITAMIN W/MINERALS CH
1.0000 | ORAL_TABLET | Freq: Every day | ORAL | Status: DC
  Administered 2023-03-12 – 2023-03-14 (×3): 1 via ORAL
  Filled 2023-03-11 (×3): qty 1

## 2023-03-11 MED ORDER — METOCLOPRAMIDE HCL 5 MG/ML IJ SOLN: 5.0000 mg | Freq: Three times a day (TID) | INTRAMUSCULAR | Status: DC | PRN

## 2023-03-11 MED ORDER — CHLORHEXIDINE GLUCONATE 0.12 % MT SOLN: 15.0000 mL | Freq: Once | OROMUCOSAL | Status: AC

## 2023-03-11 MED ORDER — ONDANSETRON HCL 4 MG/2ML IJ SOLN
INTRAMUSCULAR | Status: DC | PRN
  Administered 2023-03-11: 4 mg via INTRAVENOUS

## 2023-03-11 MED ORDER — ENSURE ENLIVE PO LIQD
237.0000 mL | Freq: Two times a day (BID) | ORAL | Status: DC
  Administered 2023-03-12 – 2023-03-14 (×4): 237 mL via ORAL

## 2023-03-11 MED ORDER — SUGAMMADEX SODIUM 200 MG/2ML IV SOLN
INTRAVENOUS | Status: DC | PRN
  Administered 2023-03-11: 150 mg via INTRAVENOUS

## 2023-03-11 MED ORDER — ROCURONIUM BROMIDE 10 MG/ML (PF) SYRINGE
PREFILLED_SYRINGE | INTRAVENOUS | Status: AC
  Filled 2023-03-11: qty 10

## 2023-03-11 MED ORDER — ACETAMINOPHEN 325 MG PO TABS
650.0000 mg | ORAL_TABLET | Freq: Three times a day (TID) | ORAL | Status: DC
  Administered 2023-03-11 – 2023-03-14 (×9): 650 mg via ORAL
  Filled 2023-03-11 (×9): qty 2

## 2023-03-11 MED ORDER — VANCOMYCIN HCL 1000 MG IV SOLR
INTRAVENOUS | Status: DC | PRN
  Administered 2023-03-11: 1000 mg via INTRAVENOUS

## 2023-03-11 MED ORDER — ONDANSETRON HCL 4 MG/2ML IJ SOLN: 4.0000 mg | Freq: Four times a day (QID) | INTRAMUSCULAR | Status: DC | PRN

## 2023-03-11 MED ORDER — POVIDONE-IODINE 10 % EX SWAB: 2.0000 | Freq: Once | CUTANEOUS | Status: DC

## 2023-03-11 MED ORDER — PROPOFOL 10 MG/ML IV BOLUS
INTRAVENOUS | Status: DC | PRN
Start: 2023-03-11 — End: 2023-03-11
  Administered 2023-03-11: 90 mg via INTRAVENOUS

## 2023-03-11 MED ORDER — PHENYLEPHRINE 80 MCG/ML (10ML) SYRINGE FOR IV PUSH (FOR BLOOD PRESSURE SUPPORT)
PREFILLED_SYRINGE | INTRAVENOUS | Status: AC
  Filled 2023-03-11: qty 10

## 2023-03-11 MED ORDER — FENTANYL CITRATE (PF) 250 MCG/5ML IJ SOLN
INTRAMUSCULAR | Status: DC | PRN
  Administered 2023-03-11 (×2): 50 ug via INTRAVENOUS

## 2023-03-11 SURGICAL SUPPLY — 56 items
BAG COUNTER SPONGE SURGICOUNT (BAG) ×2 IMPLANT
BAG SPNG CNTER NS LX DISP (BAG) ×1
BIT DRILL CANN LG 4.3MM (BIT) IMPLANT
BNDG CMPR 5X6 CHSV STRCH STRL (GAUZE/BANDAGES/DRESSINGS)
BNDG COHESIVE 6X5 TAN ST LF (GAUZE/BANDAGES/DRESSINGS) IMPLANT
BRUSH SCRUB EZ PLAIN DRY (MISCELLANEOUS) ×4 IMPLANT
COVER PERINEAL POST (MISCELLANEOUS) ×2 IMPLANT
COVER SURGICAL LIGHT HANDLE (MISCELLANEOUS) ×4 IMPLANT
DRAPE C-ARM 42X72 X-RAY (DRAPES) ×2 IMPLANT
DRAPE C-ARMOR (DRAPES) ×2 IMPLANT
DRAPE HALF SHEET 40X57 (DRAPES) IMPLANT
DRAPE INCISE IOBAN 66X45 STRL (DRAPES) ×2 IMPLANT
DRAPE ORTHO SPLIT 77X108 STRL (DRAPES)
DRAPE SURG ORHT 6 SPLT 77X108 (DRAPES) IMPLANT
DRAPE U-SHAPE 47X51 STRL (DRAPES) ×2 IMPLANT
DRESSING MEPILEX FLEX 4X4 (GAUZE/BANDAGES/DRESSINGS) ×2 IMPLANT
DRILL BIT CANN LG 4.3MM (BIT) ×1
DRSG EMULSION OIL 3X3 NADH (GAUZE/BANDAGES/DRESSINGS) ×2 IMPLANT
DRSG MEPILEX FLEX 4X4 (GAUZE/BANDAGES/DRESSINGS) ×1
DRSG MEPILEX POST OP 4X8 (GAUZE/BANDAGES/DRESSINGS) ×2 IMPLANT
DRSG MEPITEL 4X7.2 (GAUZE/BANDAGES/DRESSINGS) IMPLANT
ELECT REM PT RETURN 9FT ADLT (ELECTROSURGICAL) ×1
ELECTRODE REM PT RTRN 9FT ADLT (ELECTROSURGICAL) ×2 IMPLANT
GLOVE BIO SURGEON STRL SZ7.5 (GLOVE) ×2 IMPLANT
GLOVE BIO SURGEON STRL SZ8 (GLOVE) ×2 IMPLANT
GLOVE BIOGEL PI IND STRL 7.5 (GLOVE) ×2 IMPLANT
GLOVE BIOGEL PI IND STRL 8 (GLOVE) ×2 IMPLANT
GLOVE SURG ORTHO LTX SZ7.5 (GLOVE) ×4 IMPLANT
GOWN STRL REUS W/ TWL LRG LVL3 (GOWN DISPOSABLE) ×4 IMPLANT
GOWN STRL REUS W/ TWL XL LVL3 (GOWN DISPOSABLE) ×2 IMPLANT
GOWN STRL REUS W/TWL LRG LVL3 (GOWN DISPOSABLE) ×2
GOWN STRL REUS W/TWL XL LVL3 (GOWN DISPOSABLE) ×1
GUIDEPIN VERSANAIL DSP 3.2X444 (ORTHOPEDIC DISPOSABLE SUPPLIES) IMPLANT
HIP FRA NAIL LAG SCREW 10.5X90 (Orthopedic Implant) ×1 IMPLANT
KIT BASIN OR (CUSTOM PROCEDURE TRAY) ×2 IMPLANT
KIT TURNOVER KIT B (KITS) ×2 IMPLANT
MANIFOLD NEPTUNE II (INSTRUMENTS) ×2 IMPLANT
NAIL HIP FRACT 130D 11X180 (Screw) IMPLANT
NS IRRIG 1000ML POUR BTL (IV SOLUTION) ×2 IMPLANT
PACK GENERAL/GYN (CUSTOM PROCEDURE TRAY) ×2 IMPLANT
PAD ARMBOARD 7.5X6 YLW CONV (MISCELLANEOUS) ×4 IMPLANT
SCREW ANTI ROTATION 80MM (Screw) IMPLANT
SCREW BONE CORTICAL 5.0X3 (Screw) IMPLANT
SCREW LAG HIP FRA NAIL 10.5X90 (Orthopedic Implant) IMPLANT
STAPLER VISISTAT 35W (STAPLE) ×2 IMPLANT
STOCKINETTE IMPERVIOUS LG (DRAPES) IMPLANT
SUT ETHILON 2 0 PSLX (SUTURE) ×2 IMPLANT
SUT VIC AB 0 CT1 27 (SUTURE) ×1
SUT VIC AB 0 CT1 27XBRD ANBCTR (SUTURE) ×2 IMPLANT
SUT VIC AB 1 CT1 27 (SUTURE) ×1
SUT VIC AB 1 CT1 27XBRD ANBCTR (SUTURE) ×2 IMPLANT
SUT VIC AB 2-0 CT1 27 (SUTURE) ×1
SUT VIC AB 2-0 CT1 TAPERPNT 27 (SUTURE) ×2 IMPLANT
TOWEL GREEN STERILE (TOWEL DISPOSABLE) ×4 IMPLANT
TOWEL GREEN STERILE FF (TOWEL DISPOSABLE) ×2 IMPLANT
WATER STERILE IRR 1000ML POUR (IV SOLUTION) ×2 IMPLANT

## 2023-03-11 NOTE — Anesthesia Preprocedure Evaluation (Addendum)
Anesthesia Evaluation  Patient identified by MRN, date of birth, ID band Patient awake    Reviewed: Allergy & Precautions, NPO status , Patient's Chart, lab work & pertinent test results  Airway Mallampati: III  TM Distance: >3 FB Neck ROM: Full    Dental  (+) Edentulous Upper, Edentulous Lower   Pulmonary neg pulmonary ROS   Pulmonary exam normal breath sounds clear to auscultation       Cardiovascular hypertension, Pt. on medications  Rhythm:Regular Rate:Normal + Systolic murmurs    Neuro/Psych CVA    GI/Hepatic negative GI ROS, Neg liver ROS,,,  Endo/Other  negative endocrine ROS    Renal/GU negative Renal ROS     Musculoskeletal negative musculoskeletal ROS (+)    Abdominal   Peds  Hematology  (+) Blood dyscrasia, anemia   Anesthesia Other Findings   Reproductive/Obstetrics                             Anesthesia Physical Anesthesia Plan  ASA: 3  Anesthesia Plan: General   Post-op Pain Management: Tylenol PO (pre-op)* and Gabapentin PO (pre-op)*   Induction: Intravenous  PONV Risk Score and Plan: 3 and Ondansetron, Dexamethasone and Treatment may vary due to age or medical condition  Airway Management Planned: Oral ETT  Additional Equipment:   Intra-op Plan:   Post-operative Plan: Extubation in OR  Informed Consent: I have reviewed the patients History and Physical, chart, labs and discussed the procedure including the risks, benefits and alternatives for the proposed anesthesia with the patient or authorized representative who has indicated his/her understanding and acceptance.     Dental advisory given  Plan Discussed with: CRNA  Anesthesia Plan Comments:         Anesthesia Quick Evaluation

## 2023-03-11 NOTE — Anesthesia Procedure Notes (Signed)
Procedure Name: Intubation Date/Time: 03/11/2023 10:11 AM  Performed by: Waynard Edwards, CRNAPre-anesthesia Checklist: Patient identified, Emergency Drugs available, Suction available and Patient being monitored Patient Re-evaluated:Patient Re-evaluated prior to induction Oxygen Delivery Method: Circle system utilized Preoxygenation: Pre-oxygenation with 100% oxygen Induction Type: IV induction Ventilation: Mask ventilation without difficulty Laryngoscope Size: Miller and 3 Grade View: Grade I Tube type: Oral Tube size: 7.5 mm Number of attempts: 1 Airway Equipment and Method: Stylet Placement Confirmation: ETT inserted through vocal cords under direct vision, positive ETCO2 and breath sounds checked- equal and bilateral Secured at: 22 cm Tube secured with: Tape Dental Injury: Teeth and Oropharynx as per pre-operative assessment

## 2023-03-11 NOTE — Progress Notes (Signed)
Orthopedic Tech Progress Note Patient Details:  Douglas Pruitt [birth year] [id number]  Called in order to [employer] for a CUSTOM AFO   Patient ID: Azeez Wilds, male   DOB: Oct 27, 1950, 71 y.o.   MRN: [id number]  [name] 03/11/2023, 4:38 PM

## 2023-03-11 NOTE — Progress Notes (Signed)
Initial Nutrition Assessment  DOCUMENTATION CODES:   Not applicable  INTERVENTION:  Once diet resumes, recommend: Regular diet Ensure Enlive po BID, each supplement provides 350 kcal and 20 grams of protein. MVI with minerals daily  NUTRITION DIAGNOSIS:   Increased nutrient needs related to hip fracture as evidenced by estimated needs.  GOAL:   Patient will meet greater than or equal to 90% of their needs  MONITOR:   PO intake, Supplement acceptance, Labs, Weight trends, Diet advancement  REASON FOR ASSESSMENT:   Consult Hip fracture protocol  ASSESSMENT:   Pt admitted with L hip fracture after a fall. PMH significant for HTN, HLD, carotid artery occlusion, and CVA with residual L hemiparesis   Pt off unit at time of visit for IM nail of L hip fracture. Unable to obtain detailed nutrition related history at this time. Will assess further on follow up.   No documented meal completions to review as pt was admitted yesterday and remains NPO today for surgery.   Unfortunately there is limited documentation of weight on file over the last year. Pt's weight noted to be 60.8 kg July 2023. Current weight of 63.5 kg. Question whether this is actual measured weight on admission. Plan to continue monitoring weight trends throughout admission.   Medications: colace, senna  Labs: potassium 3.4, BUN 6  NUTRITION - FOCUSED PHYSICAL EXAM: Pt off unit for surgery. Deferred to follow up.   Diet Order:   Diet Order             Diet NPO time specified Except for: Sips with Meds  Diet effective midnight                   EDUCATION NEEDS:   No education needs have been identified at this time  Skin:  Skin Assessment: Reviewed RN Assessment  Last BM:  8/11  Height:   Ht Readings from Last 1 Encounters:  03/11/23 5\' 5"  (1.651 m)    Weight:   Wt Readings from Last 1 Encounters:  03/11/23 63.5 kg   BMI:  Body mass index is 23.3 kg/m.  Estimated Nutritional  Needs:   Kcal:  1600-1800  Protein:  80-95g  Fluid:  >/=1.6L  Drusilla Kanner, RDN, LDN Clinical Nutrition

## 2023-03-11 NOTE — Plan of Care (Signed)

## 2023-03-11 NOTE — Op Note (Signed)
08/29/2022  11:57 AM  PATIENT:  Douglas Pruitt  Mar 12, 1951 male   MEDICAL RECORD NUMBER: [id number]  PRE-OPERATIVE DIAGNOSIS:  LEFT HIP INTERTROCHANTERIC FRACTURE  POST-OPERATIVE DIAGNOSIS:  LEFT HIP INTERTROCHANTERIC FRACTURE  PROCEDURE:  INTRAMEDULLARY NAILING OF THE LEFT HIP using a Biomet Affixus nail 11 mm diameter locked short.  SURGEON:  Doralee Albino. Carola Frost, M.D.  ASSISTANT:  Montez Morita, PA-C.  ANESTHESIA:  General.  COMPLICATIONS:  None.  ESTIMATED BLOOD LOSS:  Less than 150 mL.  DISPOSITION:  To PACU.  CONDITION:  Stable.  DELAY START OF DVT PROPHYLAXIS BECAUSE OF BLEEDING RISK: NO  BRIEF SUMMARY AND INDICATION OF PROCEDURE:  [name] is a 72 y.o. year- old with hemiplegia from a stroke in 2023. He uses a cane for ambulation and has fallen several times.  I discussed with the patient and his daughter risks and benefits of surgical treatment including the potential for malunion, nonunion, symptomatic hardware, heart attack, stroke, neurovascular injury, bleeding, and others.  After full discussion, the patient and family provided consent to proceed.  BRIEF SUMMARY OF PROCEDURE:  The patient was taken to the operating room where general anesthesia was induced.  He was positioned supine on the Hana fracture table.  A closed reduction maneuver was performed of the fractured proximal femur and this was confirmed on both AP and lateral xray views. A thorough scrub and wash with chlorhexidine and then Betadine scrub and paint was performed.  After sterile drapes and time-out, a long instrument was used to identify the appropriate starting position under C-arm on both AP and lateral images.  A 3 cm incision was made proximal to the greater trochanter.  The curved cannulated awl was inserted just medial to the tip of the lateral trochanter and then the starting guidewire advanced into the proximal femur.  This was checked on AP and lateral views.  The starting reamer was engaged with the soft  tissue protected by a sleeve.  The 11 mm short nail was then inserted to the appropriate depth.  The guidewire for the lag screw was then inserted with the appropriate anteversion to make sure it was in a center-center position. Because this was a left hip Deward antirotation pin was inserted to prevent malrotation at the fracture site during lag screw insertion. The lag screw was then placed with excellent purchase and position checked on both views.  Traction was released and compression achieved with the screw.  The set screw was then engaged within the groove of the lag screw, which was allowed to telescope. The antirotation screw was then inserted. This was followed by placement of one distal locking screw using the jig.  This was confirmed on AP and lateral images. Wounds were irrigated thoroughly, closed in a standard layered fashion. Sterile gently compressive dressings were applied.  Montez Morita, PA-C assisted throughout.  The patient was awakened from anesthesia and transported to the PACU in stable condition.  PROGNOSIS:  The patient will be weightbearing as tolerated with physical therapy beginning DVT prophylaxis as soon as deemed stable by the Primary Care Service.  He has no range of motion precautions. We will also order him [employer] AFO for functional foot drop of his left ankle, which could dramatically decrease his fall risk if he is willing to use it.  We will continue to follow while in the hospital.  Anticipate follow up in the office in 2 weeks for removal of sutures and further evaluation. Patient lives with his daughter and went to Rehab  here per her report after his stroke last year.     Doralee Albino. Carola Frost, M.D.

## 2023-03-11 NOTE — Plan of Care (Signed)

## 2023-03-11 NOTE — Transfer of Care (Signed)
Immediate Anesthesia Transfer of Care Note  Patient: Douglas Pruitt  Procedure(s) Performed: INTRAMEDULLARY (IM) NAIL INTERTROCHANTERIC (Left)  Patient Location: PACU  Anesthesia Type:General  Level of Consciousness: drowsy  Airway & Oxygen Therapy: Patient Spontanous Breathing and Patient connected to nasal cannula oxygen  Post-op Assessment: Report given to RN and Post -op Vital signs reviewed and stable  Post vital signs: Reviewed and stable  Last Vitals:  Vitals Value Taken Time  BP 129/43 03/11/23 1153  Temp    Pulse 93 03/11/23 1157  Resp 18 03/11/23 1157  SpO2 100 % 03/11/23 1157  Vitals shown include unfiled device data.  Last Pain:  Vitals:   03/11/23 0906  TempSrc:   PainSc: 10-Worst pain ever      Patients Stated Pain Goal: 0 (03/11/23 0906)  Complications: No notable events documented.

## 2023-03-11 NOTE — Progress Notes (Addendum)
PROGRESS NOTE    Douglas Pruitt  TFT:[id number] DOB: November 09, 1950 DOA: 03/10/2023 PCP: [name], FNP   Brief Narrative:  Patient is a 72 y.o. montagnard speaking male with medical history significant of hypertension, hyperlipidemia, carotid artery occlusion, and CVA with residual left hemiparesis who presents after a mechanical fall at home with left hip pain. Imaging confirms LEFT intertrochanteric femur fracture. Hospitalist called for admission, orthopedics called for consult..     Assessment & Plan:   Principal Problem:   Closed intertrochanteric fracture of hip, left, initial encounter [facility]) Active Problems:   Fall at home, initial encounter   Leukocytosis   Normocytic anemia   Thrombocytopenia (HCC)   Essential hypertension   History of CVA with residual deficit   Mixed hyperlipidemia   BPH (benign prostatic hyperplasia)   Intertrochanteric fracture of the left femur secondary to mechanical fall at home -Mechanical fall without presyncope/syncope -Orthopedics consulted with plans for intramedullary nail later this morning. -Oxycodone/morphine IV as needed for moderate to severe pain -Colace scheduled daily - follow for BM **Afternoon update - tolerated procedure well "IM nailing of left hip using Biomet Affixus nail 11 mm diameter locked short"   Leukocytosis, likely reactive -Downtrending with supportive care - Suspect secondary to acute fracture/pain/stress. - No source noted - CXR without overt findings, UA WNL, follow cultures   Normocytic anemia, iron deficiency, POA Thrombocytopenia Platelet count previously WNL - currently 706(237 at intake) - patient has not received heparin(lovenox x1) - continue to follow. Iron panel consistent with iron deficiency anemia; postoperatively will initiate IV iron x 1, p.o. iron to follow No signs/symptoms of bleeding HIV negative  Essential hypertension Continue home lisinopril 2.5 mg daily. Likely elevated secondary to  pain PRN hydralazine   History of CVA with residual deficit Patient with prior history of CVA secondary to internal carotid arteries occlusion in 2023. -Continue aspirin and statin   Hyperlipidemia -Continue atorvastatin   BPH -Continue tamsulosin  DVT prophylaxis: enoxaparin (LOVENOX) injection 40 mg Start: 03/10/23 1600 Code Status:   Code Status: Full Code Family Communication: None present  Status is: Inpt  Dispo: The patient is from: Home              Anticipated d/c is to: TBD - Likely SNF              Anticipated d/c date is: 72+h              Patient currently NOT medically stable for discharge  Consultants:  Ortho  Procedures:  ORIF tentatively planned 03/11/23  Antimicrobials:  Perioperatively   Subjective: No acute issues/events overnight - pain well controlled.  Objective: Vitals:   03/10/23 1500 03/10/23 1526 03/10/23 1947 03/11/23 0600  BP: (!) 156/75 (!) 156/71 123/78 124/67  Pulse: 89 73 94 85  Resp: 18     Temp:  98.5 F (36.9 C) 98.4 F (36.9 C) 99.2 F (37.3 C)  TempSrc:  Oral  Oral  SpO2: 100% 100% 99% 100%  Weight:      Height:        Intake/Output Summary (Last 24 hours) at 03/11/2023 0700 Last data filed at 03/10/2023 1645 Gross per 24 hour  Intake --  Output 200 ml  Net -200 ml   Filed Weights   03/10/23 1000  Weight: 63.5 kg    Examination:  General:  Pleasantly resting in bed, No acute distress. HEENT:  Normocephalic atraumatic.  Sclerae nonicteric, noninjected.  Extraocular movements intact bilaterally. Neck:  Without mass or  deformity.  Trachea is midline. Lungs:  Clear to auscultate bilaterally without rhonchi, wheeze, or rales. Heart:  Regular rate and rhythm.  Without murmurs, rubs, or gallops. Abdomen:  Soft, nontender, nondistended.  Without guarding or rebound. Extremities: Without cyanosis, clubbing, edema, or obvious deformity. LLE strength/ROM limited by pain Skin:  Warm and dry, no erythema.   Data  Reviewed: I have personally reviewed following labs and imaging studies  CBC: Recent Labs  Lab 03/10/23 1216 03/11/23 0203  WBC 16.0* 11.2*  NEUTROABS 12.7*  --   HGB 12.5* 10.8*  HCT 38.7* 32.5*  MCV 86.6 86.0  PLT 125* 109*   Basic Metabolic Panel: Recent Labs  Lab 03/10/23 1216 03/11/23 0203  NA 140 138  K 3.7 3.4*  CL 105 101  CO2 25 27  GLUCOSE 106* 128*  BUN 8 6*  CREATININE 0.67 0.75  CALCIUM 8.6* 8.3*   GFR: Estimated Creatinine Clearance: 70.5 mL/min (by C-G formula based on SCr of 0.75 mg/dL). Liver Function Tests: No results for input(s): "AST", "ALT", "ALKPHOS", "BILITOT", "PROT", "ALBUMIN" in the last 168 hours. No results for input(s): "LIPASE", "AMYLASE" in the last 168 hours. No results for input(s): "AMMONIA" in the last 168 hours. Coagulation Profile: Recent Labs  Lab 03/10/23 1216  INR 1.1   Cardiac Enzymes: No results for input(s): "CKTOTAL", "CKMB", "CKMBINDEX", "TROPONINI" in the last 168 hours. BNP (last 3 results) No results for input(s): "PROBNP" in the last 8760 hours. HbA1C: No results for input(s): "HGBA1C" in the last 72 hours. CBG: No results for input(s): "GLUCAP" in the last 168 hours. Lipid Profile: No results for input(s): "CHOL", "HDL", "LDLCALC", "TRIG", "CHOLHDL", "LDLDIRECT" in the last 72 hours. Thyroid Function Tests: No results for input(s): "TSH", "T4TOTAL", "FREET4", "T3FREE", "THYROIDAB" in the last 72 hours. Anemia Panel: No results for input(s): "VITAMINB12", "FOLATE", "FERRITIN", "TIBC", "IRON", "RETICCTPCT" in the last 72 hours. Sepsis Labs: No results for input(s): "PROCALCITON", "LATICACIDVEN" in the last 168 hours.  Recent Results (from the past 240 hour(s))  Surgical PCR screen     Status: Abnormal   Collection Time: 03/10/23  4:20 PM   Specimen: Nasal Mucosa; Nasal Swab  Result Value Ref Range Status   MRSA, PCR POSITIVE (A) NEGATIVE Final    Comment: RESULT CALLED TO, READ BACK BY AND VERIFIED  WITH: RN REINA AVDI ON 03/10/23 @ 1923 BY DRT    Staphylococcus aureus POSITIVE (A) NEGATIVE Final    Comment: (NOTE) The Xpert SA Assay (FDA approved for NASAL specimens in patients 81 years of age and older), is one component of a comprehensive surveillance program. It is not intended to diagnose infection nor to guide or monitor treatment. Performed at [facility], [address], [place], [place] [address]          Radiology Studies: DG Chest 1 View  Result Date: 03/10/2023 CLINICAL DATA:  [id number] Fall [id number] EXAM: CHEST  1 VIEW COMPARISON:  CXR 10/29/21 FINDINGS: No pleural effusion. No pneumothorax. No focal airspace opacity. Normal cardiac and mediastinal contours. No radiographically apparent displaced rib fractures. Visualized upper abdomen is unremarkable. IMPRESSION: No focal airspace opacity Electronically Signed   By: Lorenza Cambridge M.D.   On: 03/10/2023 12:07   DG Pelvis 1-2 Views  Result Date: 03/10/2023 CLINICAL DATA:  Fall with left hip pain EXAM: PELVIS - 1-2 VIEW COMPARISON:  Femur radiography. FINDINGS: Inter trochanteric fracture of the left femur with lateral angulation. Bones of the pelvis are intact. Right hip is normal. IMPRESSION:  Intertrochanteric fracture of the left femur with lateral angulation. Electronically Signed   By: Paulina Fusi M.D.   On: 03/10/2023 12:06   DG Femur Min 2 Views Left  Result Date: 03/10/2023 CLINICAL DATA:  Fall with pain EXAM: LEFT FEMUR 2 VIEWS COMPARISON:  None Available. FINDINGS: Intertrochanteric fracture of the femur with lateral angulation. No fracture seen distal to that. IMPRESSION: Intertrochanteric fracture of the femur with lateral angulation. Electronically Signed   By: Paulina Fusi M.D.   On: 03/10/2023 12:06    Scheduled Meds:  aspirin EC  81 mg Oral Daily   atorvastatin  40 mg Oral Daily   baclofen  10 mg Oral BID   docusate sodium  100 mg Oral Daily   enoxaparin (LOVENOX) injection  40 mg Subcutaneous Q24H    gabapentin  300 mg Oral QHS   lisinopril  2.5 mg Oral Daily   mupirocin ointment  1 Application Nasal BID   senna-docusate  1 tablet Oral QHS   tamsulosin  0.4 mg Oral QPC supper   Continuous Infusions:   LOS: 1 day   Time spent:  Azucena Fallen, DO [facility]  If 7PM-7AM, please contact night-coverage [email or web address]  03/11/2023, 7:00 AM

## 2023-03-12 ENCOUNTER — Encounter (HOSPITAL_COMMUNITY): Payer: Self-pay | Admitting: Orthopedic Surgery

## 2023-03-12 DIAGNOSIS — S72142A Displaced intertrochanteric fracture of left femur, initial encounter for closed fracture: Secondary | ICD-10-CM | POA: Diagnosis not present

## 2023-03-12 MED ORDER — VITAMIN D 25 MCG (1000 UNIT) PO TABS
2000.0000 [IU] | ORAL_TABLET | Freq: Two times a day (BID) | ORAL | Status: DC
  Administered 2023-03-12 – 2023-03-14 (×5): 2000 [IU] via ORAL
  Filled 2023-03-12 (×5): qty 2

## 2023-03-12 MED ORDER — VITAMIN C 500 MG PO TABS
1000.0000 mg | ORAL_TABLET | Freq: Every day | ORAL | Status: DC
  Administered 2023-03-12 – 2023-03-14 (×3): 1000 mg via ORAL
  Filled 2023-03-12 (×3): qty 2

## 2023-03-12 NOTE — Plan of Care (Signed)

## 2023-03-12 NOTE — Anesthesia Postprocedure Evaluation (Signed)
Anesthesia Post Note  Patient: Douglas Pruitt  Procedure(s) Performed: INTRAMEDULLARY (IM) NAIL INTERTROCHANTERIC (Left)     Patient location during evaluation: PACU Anesthesia Type: General Level of consciousness: sedated and patient cooperative Pain management: pain level controlled Vital Signs Assessment: post-procedure vital signs reviewed and stable Respiratory status: spontaneous breathing Cardiovascular status: stable Anesthetic complications: no   No notable events documented.  Last Vitals:  Vitals:   03/12/23 0453 03/12/23 0757  BP: 92/75 (!) 106/58  Pulse: 75 81  Resp: 15 18  Temp: 36.7 C 36.7 C  SpO2: 100% 99%    Last Pain:  Vitals:   03/12/23 0830  TempSrc:   PainSc: 4                  Lewie Loron

## 2023-03-12 NOTE — TOC CAGE-AID Note (Signed)
Transition of Care [facility]) - CAGE-AID Screening   Patient Details  Name: Douglas Pruitt MRN: [id number] Date of Birth: Aug 20, 1950   Hewitt Shorts, RN Trauma Response Nurse Phone Number: [phone] 03/12/2023, 9:59 AM       CAGE-AID Screening:    Have You Ever Felt You Ought to Cut Down on Your Drinking or Drug Use?: No Have People Annoyed You By Critizing Your Drinking Or Drug Use?: No Have You Felt Bad Or Guilty About Your Drinking Or Drug Use?: No Have You Ever Had a Drink or Used Drugs First Thing In The Morning to Steady Your Nerves or to Get Rid of a Hangover?: No CAGE-AID Score: 0  Substance Abuse Education Offered: No (Daughter at bedside, able to interpret for pt- pt does speak and understand some [place]. Does not drink alcohol, No services needed)

## 2023-03-12 NOTE — Progress Notes (Signed)
Orthopaedic Trauma Service Progress Note  Patient ID: Douglas Pruitt MRN: [id number] DOB/AGE: 08-28-1950 72 y.o.  Subjective:  Doing ok  Slept well  Ate breakfast this am   Daughter at bedside and assists with interpretation  Lives with daughter  Stroke march 2023, went to SNF for about 7 months after the hospital. Has had several falls at home. Uses cane at baseline.  Does not wear shoes typically. Usually just wears socks   Daughter states that he has a LUEx deficit as well. Unable to fully straighten elbow which is why (in her opinion) he has never used a walker  ROS As above  Objective:   VITALS:   Vitals:   03/11/23 1600 03/11/23 1935 03/12/23 0453 03/12/23 0757  BP: 122/68 132/72 92/75 (!) 106/58  Pulse: (!) 101 100 75 81  Resp: 16 16 15 18   Temp: 98.9 F (37.2 C) 98.3 F (36.8 C) 98 F (36.7 C) 98 F (36.7 C)  TempSrc: Oral Oral Oral   SpO2: 99% 99% 100% 99%  Weight:      Height:        Estimated body mass index is 23.3 kg/m as calculated from the following:   Height as of this encounter: 5\' 5"  (1.651 m).   Weight as of this encounter: 63.5 kg.   Intake/Output      08/13 0701 08/14 0700 08/14 0701 08/15 0700   P.O. 120    I.V. (mL/kg) 1150 (18.1)    IV Piggyback 491.8    Total Intake(mL/kg) 1761.8 (27.7)    Urine (mL/kg/hr) 2350 (1.5)    Stool 0    Blood 50    Total Output 2400    Net -638.2           LABS  Results for orders placed or performed during the hospital encounter of 03/10/23 (from the past 24 hour(s))  CBC     Status: Abnormal   Collection Time: 03/11/23 11:51 PM  Result Value Ref Range   WBC 12.6 (H) 4.0 - 10.5 K/uL   RBC 3.22 (L) 4.22 - 5.81 MIL/uL   Hemoglobin 9.3 (L) 13.0 - 17.0 g/dL   HCT 78.2 (L) 95.6 - 21.3 %   MCV 85.4 80.0 - 100.0 fL   MCH 28.9 26.0 - 34.0 pg   MCHC 33.8 30.0 - 36.0 g/dL   RDW 08.6 57.8 - 46.9 %   Platelets 92 (L) 150 - 400 K/uL    nRBC 0.0 0.0 - 0.2 %  Basic metabolic panel     Status: Abnormal   Collection Time: 03/11/23 11:51 PM  Result Value Ref Range   Sodium 135 135 - 145 mmol/L   Potassium 3.9 3.5 - 5.1 mmol/L   Chloride 101 98 - 111 mmol/L   CO2 25 22 - 32 mmol/L   Glucose, Bld 147 (H) 70 - 99 mg/dL   BUN 12 8 - 23 mg/dL   Creatinine, Ser 6.29 0.61 - 1.24 mg/dL   Calcium 8.2 (L) 8.9 - 10.3 mg/dL   GFR, Estimated >52 >84 mL/min   Anion gap 9 5 - 15  VITAMIN D 25 Hydroxy (Vit-D Deficiency, Fractures)     Status: Abnormal   Collection Time: 03/11/23 11:51 PM  Result Value Ref Range   Vit D, 25-Hydroxy 21.49 (L) 30 - 100 ng/mL  call our office at [phone] and then follow the prompts to be connected to the call team.  Patient ID: Douglas Pruitt, male   DOB: November 23, 1950, 72 y.o.   MRN: [id number]  Orthopaedic Trauma Service Progress Note  Patient ID: Douglas Pruitt MRN: [id number] DOB/AGE: 08-28-1950 72 y.o.  Subjective:  Doing ok  Slept well  Ate breakfast this am   Daughter at bedside and assists with interpretation  Lives with daughter  Stroke march 2023, went to SNF for about 7 months after the hospital. Has had several falls at home. Uses cane at baseline.  Does not wear shoes typically. Usually just wears socks   Daughter states that he has a LUEx deficit as well. Unable to fully straighten elbow which is why (in her opinion) he has never used a walker  ROS As above  Objective:   VITALS:   Vitals:   03/11/23 1600 03/11/23 1935 03/12/23 0453 03/12/23 0757  BP: 122/68 132/72 92/75 (!) 106/58  Pulse: (!) 101 100 75 81  Resp: 16 16 15 18   Temp: 98.9 F (37.2 C) 98.3 F (36.8 C) 98 F (36.7 C) 98 F (36.7 C)  TempSrc: Oral Oral Oral   SpO2: 99% 99% 100% 99%  Weight:      Height:        Estimated body mass index is 23.3 kg/m as calculated from the following:   Height as of this encounter: 5\' 5"  (1.651 m).   Weight as of this encounter: 63.5 kg.   Intake/Output      08/13 0701 08/14 0700 08/14 0701 08/15 0700   P.O. 120    I.V. (mL/kg) 1150 (18.1)    IV Piggyback 491.8    Total Intake(mL/kg) 1761.8 (27.7)    Urine (mL/kg/hr) 2350 (1.5)    Stool 0    Blood 50    Total Output 2400    Net -638.2           LABS  Results for orders placed or performed during the hospital encounter of 03/10/23 (from the past 24 hour(s))  CBC     Status: Abnormal   Collection Time: 03/11/23 11:51 PM  Result Value Ref Range   WBC 12.6 (H) 4.0 - 10.5 K/uL   RBC 3.22 (L) 4.22 - 5.81 MIL/uL   Hemoglobin 9.3 (L) 13.0 - 17.0 g/dL   HCT 78.2 (L) 95.6 - 21.3 %   MCV 85.4 80.0 - 100.0 fL   MCH 28.9 26.0 - 34.0 pg   MCHC 33.8 30.0 - 36.0 g/dL   RDW 08.6 57.8 - 46.9 %   Platelets 92 (L) 150 - 400 K/uL    nRBC 0.0 0.0 - 0.2 %  Basic metabolic panel     Status: Abnormal   Collection Time: 03/11/23 11:51 PM  Result Value Ref Range   Sodium 135 135 - 145 mmol/L   Potassium 3.9 3.5 - 5.1 mmol/L   Chloride 101 98 - 111 mmol/L   CO2 25 22 - 32 mmol/L   Glucose, Bld 147 (H) 70 - 99 mg/dL   BUN 12 8 - 23 mg/dL   Creatinine, Ser 6.29 0.61 - 1.24 mg/dL   Calcium 8.2 (L) 8.9 - 10.3 mg/dL   GFR, Estimated >52 >84 mL/min   Anion gap 9 5 - 15  VITAMIN D 25 Hydroxy (Vit-D Deficiency, Fractures)     Status: Abnormal   Collection Time: 03/11/23 11:51 PM  Result Value Ref Range   Vit D, 25-Hydroxy 21.49 (L) 30 - 100 ng/mL

## 2023-03-12 NOTE — Progress Notes (Addendum)
[facility] PROGRESS NOTE    Progress Note  Douglas Pruitt  ZOX:[id number] DOB: 1951/01/04 DOA: 03/10/2023 PCP: [name], FNP     Brief Narrative:   Douglas Pruitt is Douglas Pruitt 72 y.o. male [place] speaking male with past medical history of essential hypertension hyperlipidemia CVA with left residual hemiparesis carotid artery occlusion presents after mechanical fall with left hip pain imaging confirmed left intertrochanteric fracture.   Assessment/Plan:   Closed intertrochanteric fracture of hip, left, initial encounter [facility]) Orthopedic surgery was consulted she status post intramedullary nailing on 03/11/2023. Continue narcotics per orthopedic surgery. Anticoagulation per orthopedics. Colace daily. Anticipate skilled nursing facility. PT OT has been consulted. TOC has been notified possible skilled nursing.  Leukocytosis likely reactive: No source of infection has remained afebrile continue monitor. Leukocytosis slowly improving.  Normocytic anemia/iron deficiency anemia/thrombocytopenia: Previous platelet count 109, iron panel consistent with iron deficiency anemia. She received IV iron we will continue orally as [place] outpatient. HIV negative.  No signs of overt bleeding.  Essential hypertension: Blood pressure is  elevation is likely due to pain.  At 72y/o goal pressure less than 160/90. Hydralazine IV as needed.  History of CVA with residual deficits: Continue aspirin and statins for  Hyperlipidemia Continue statins  BPH Continue Flomax.   Fall at home, initial encounter   DVT prophylaxis: lovenox Family Communication:none Status is: Inpatient Remains inpatient appropriate because: Close left hip fracture    Code Status:     Code Status Orders  (From admission, onward)           Start     Ordered   03/10/23 1423  Full code  Continuous       Question:  By:  Answer:  Consent: discussion documented in EHR   03/10/23 1433           Code Status History      Date Active Date Inactive Code Status Order ID Comments User Context   11/01/2021 1717 11/29/2021 1559 DNR [id number]  Charlton Amor, PA-C Inpatient   11/01/2021 1717 11/01/2021 1717 Full Code [id number]  Kenyan, Muzik, PA-C Inpatient   10/30/2021 0829 11/01/2021 1714 DNR [id number]  Jonah Blue, MD ED   10/22/2021 1347 10/24/2021 0203 Full Code [id number]  Emeline General, MD ED         IV Access:   Peripheral IV   Procedures and diagnostic studies:   DG FEMUR PORT MIN 2 VIEWS LEFT  Result Date: 03/11/2023 CLINICAL DATA:  Status post intramedullary nail fixation of the left intertrochanteric fracture EXAM: LEFT FEMUR PORTABLE 2 VIEWS COMPARISON:  Pelvis radiographs 1 day prior FINDINGS: There has been interval intramedullary nail fixation of the intertrochanteric fracture. Alignment is significantly improved compared to the preoperative study, now anatomic. There is no evidence of hardware related complication there is expected surrounding postoperative soft tissue gas. Femoroacetabular alignment is maintained. The left SI joint and symphysis pubis are intact. The remainder of the femur is unremarkable. IMPRESSION: Status post intramedullary nail fixation of the intertrochanteric fracture now with anatomic alignment. Electronically Signed   By: Lesia Hausen M.D.   On: 03/11/2023 15:49   DG FEMUR MIN 2 VIEWS LEFT  Result Date: 03/11/2023 CLINICAL DATA:  Intramedullary nail for fixation of Ebb intertrochanteric fracture. EXAM: LEFT FEMUR 2 VIEWS; DG C-ARM 1-60 MIN-NO REPORT COMPARISON:  Hip radiographs 03/10/2019 FINDINGS: Nine C-arm fluoroscopic images were obtained intraoperatively and submitted for post operative interpretation. The initial image demonstrates improved alignment of the intertrochanteric fracture compared to the  initial radiographs. Subsequent images demonstrate intramedullary nail fixation of the fracture without evidence of immediate complication. Fluoro time 1 minutes 8  seconds, dose 14.04 mGy. Please see the performing provider's procedural report for further detail. IMPRESSION: Intraoperative images during intramedullary nail fixation of the intertrochanteric fracture as above. Electronically Signed   By: Lesia Hausen M.D.   On: 03/11/2023 15:37   DG C-Arm 1-60 Min-No Report  Result Date: 03/11/2023 Fluoroscopy was utilized by the requesting physician.  No radiographic interpretation.   DG C-Arm 1-60 Min-No Report  Result Date: 03/11/2023 Fluoroscopy was utilized by the requesting physician.  No radiographic interpretation.   DG Chest 1 View  Result Date: 03/10/2023 CLINICAL DATA:  [id number] Fall [id number] EXAM: CHEST  1 VIEW COMPARISON:  CXR 10/29/21 FINDINGS: No pleural effusion. No pneumothorax. No focal airspace opacity. Normal cardiac and mediastinal contours. No radiographically apparent displaced rib fractures. Visualized upper abdomen is unremarkable. IMPRESSION: No focal airspace opacity Electronically Signed   By: Lorenza Cambridge M.D.   On: 03/10/2023 12:07   DG Pelvis 1-2 Views  Result Date: 03/10/2023 CLINICAL DATA:  Fall with left hip pain EXAM: PELVIS - 1-2 VIEW COMPARISON:  Femur radiography. FINDINGS: Inter trochanteric fracture of the left femur with lateral angulation. Bones of the pelvis are intact. Right hip is normal. IMPRESSION: Intertrochanteric fracture of the left femur with lateral angulation. Electronically Signed   By: Paulina Fusi M.D.   On: 03/10/2023 12:06   DG Femur Min 2 Views Left  Result Date: 03/10/2023 CLINICAL DATA:  Fall with pain EXAM: LEFT FEMUR 2 VIEWS COMPARISON:  None Available. FINDINGS: Intertrochanteric fracture of the femur with lateral angulation. No fracture seen distal to that. IMPRESSION: Intertrochanteric fracture of the femur with lateral angulation. Electronically Signed   By: Paulina Fusi M.D.   On: 03/10/2023 12:06     Medical Consultants:   None.   Subjective:    Douglas Pruitt no complaints, pain is  controlled  Objective:    Vitals:   03/11/23 1445 03/11/23 1600 03/11/23 1935 03/12/23 0453  BP: 122/70 122/68 132/72 92/75  Pulse: (!) 103 (!) 101 100 75  Resp:  16 16 15   Temp:  98.9 F (37.2 C) 98.3 F (36.8 C) 98 F (36.7 C)  TempSrc:  Oral Oral Oral  SpO2: 100% 99% 99% 100%  Weight:      Height:       SpO2: 100 %   Intake/Output Summary (Last 24 hours) at 03/12/2023 0737 Last data filed at 03/12/2023 9562 Gross per 24 hour  Intake 1761.84 ml  Output 2400 ml  Net -638.16 ml   Filed Weights   03/10/23 1000 03/11/23 0858  Weight: 63.5 kg 63.5 kg    Exam: General exam: In no acute distress. Respiratory system: Good air movement and clear to auscultation. Cardiovascular system: S1 & S2 heard, RRR. No JVD.  Gastrointestinal system: Abdomen is nondistended, soft and nontender.  Extremities: No pedal edema. Skin: No rashes, lesions or ulcers  Data Reviewed:    Labs: Basic Metabolic Panel: Recent Labs  Lab 03/10/23 1216 03/11/23 0203 03/11/23 2351  NA 140 138 135  K 3.7 3.4* 3.9  CL 105 101 101  CO2 25 27 25   GLUCOSE 106* 128* 147*  BUN 8 6* 12  CREATININE 0.67 0.75 0.91  CALCIUM 8.6* 8.3* 8.2*   GFR Estimated Creatinine Clearance: 62 mL/min (by C-G formula based on SCr of 0.91 mg/dL). Liver Function Tests: No results for input(s): "AST", "ALT", "ALKPHOS", "  BILITOT", "PROT", "ALBUMIN" in the last 168 hours. No results for input(s): "LIPASE", "AMYLASE" in the last 168 hours. No results for input(s): "AMMONIA" in the last 168 hours. Coagulation profile Recent Labs  Lab 03/10/23 1216  INR 1.1   COVID-19 Labs  Recent Labs    03/11/23 0719  FERRITIN 228    Lab Results  Component Value Date   SARSCOV2NAA NEGATIVE 11/27/2021   SARSCOV2NAA Detected (A) 08/16/2020   SARSCOV2NAA Detected (A) 05/25/2019   SARSCOV2NAA Not Detected 04/08/2019    CBC: Recent Labs  Lab 03/10/23 1216 03/11/23 0203 03/11/23 2351  WBC 16.0* 11.2* 12.6*   NEUTROABS 12.7*  --   --   HGB 12.5* 10.8* 9.3*  HCT 38.7* 32.5* 27.5*  MCV 86.6 86.0 85.4  PLT 125* 109* 92*   Cardiac Enzymes: No results for input(s): "CKTOTAL", "CKMB", "CKMBINDEX", "TROPONINI" in the last 168 hours. BNP (last 3 results) No results for input(s): "PROBNP" in the last 8760 hours. CBG: No results for input(s): "GLUCAP" in the last 168 hours. D-Dimer: No results for input(s): "DDIMER" in the last 72 hours. Hgb A1c: No results for input(s): "HGBA1C" in the last 72 hours. Lipid Profile: No results for input(s): "CHOL", "HDL", "LDLCALC", "TRIG", "CHOLHDL", "LDLDIRECT" in the last 72 hours. Thyroid function studies: No results for input(s): "TSH", "T4TOTAL", "T3FREE", "THYROIDAB" in the last 72 hours.  Invalid input(s): "FREET3" Anemia work up: Recent Labs    03/11/23 0719  FERRITIN 228  TIBC 230*  IRON 19*   Sepsis Labs: Recent Labs  Lab 03/10/23 1216 03/11/23 0203 03/11/23 2351  WBC 16.0* 11.2* 12.6*   Microbiology Recent Results (from the past 240 hour(s))  Surgical PCR screen     Status: Abnormal   Collection Time: 03/10/23  4:20 PM   Specimen: Nasal Mucosa; Nasal Swab  Result Value Ref Range Status   MRSA, PCR POSITIVE (A) NEGATIVE Final    Comment: RESULT CALLED TO, READ BACK BY AND VERIFIED WITH: RN REINA AVDI ON 03/10/23 @ 1923 BY DRT    Staphylococcus aureus POSITIVE (A) NEGATIVE Final    Comment: (NOTE) The Xpert SA Assay (FDA approved for NASAL specimens in patients 11 years of age and older), is one component of a comprehensive surveillance program. It is not intended to diagnose infection nor to guide or monitor treatment. Performed at [facility], [address] [place], [place] [address]      Medications:    acetaminophen  650 mg Oral Q8H   aspirin EC  81 mg Oral Daily   atorvastatin  40 mg Oral Daily   baclofen  10 mg Oral BID   docusate sodium  100 mg Oral BID   enoxaparin (LOVENOX) injection  40 mg Subcutaneous  Q24H   feeding supplement  237 mL Oral BID BM   gabapentin  300 mg Oral QHS   lisinopril  2.5 mg Oral Daily   multivitamin with minerals  1 tablet Oral Daily   mupirocin ointment  1 Application Nasal BID   senna-docusate  1 tablet Oral QHS   tamsulosin  0.4 mg Oral QPC supper   Continuous Infusions:    LOS: 2 days   Marinda Elk  [facility]  03/12/2023, 7:37 AM

## 2023-03-12 NOTE — Evaluation (Signed)
Occupational Therapy Evaluation Patient Details Name: Douglas Pruitt MRN: [id number] DOB: 14-Nov-1950 Today's Date: 03/12/2023   History of Present Illness Pt is a 72 year old man admitted on 8/12 after a fall in which he sustained a L intertrochanteric hip fx. Underwent IM nail 8/14. PMH: CVA with L residual hemiparesis, BPH, HTN, HLD.   Clinical Impression   Pt lives with his daughter and family and has a personal care attendant 5 days a week. He walks household distances with a quad cane. His daughter encourages maximum participation in ADLs and he typically requires set up to moderate assistance. Pt presents with L hip pain with inability to weight bear on L LE. He squat pivoted toward his R (strong side) with min assist from chair to bed using bed rail. Pt needed max assist to return to supine. Pt is able to complete ADLs with set up to total assist. Patient will benefit from intensive inpatient follow up therapy, >3 hours/day. Per daughter, pt was a patient on inpatient rehab last year when he suffered his stroke and wishes for her dad to return.       If plan is discharge home, recommend the following: A little help with walking and/or transfers;A lot of help with bathing/dressing/bathroom;Assistance with cooking/housework;Assistance with feeding;Direct supervision/assist for medications management;Direct supervision/assist for financial management;Assist for transportation;Help with stairs or ramp for entrance    Functional Status Assessment  Patient has had a recent decline in their functional status and demonstrates the ability to make significant improvements in function in a reasonable and predictable amount of time.  Equipment Recommendations  Other (comment) (defer to next venue)    Recommendations for Other Services Rehab consult     Precautions / Restrictions Precautions Precautions: Fall Restrictions Weight Bearing Restrictions: Yes LLE Weight Bearing: Weight bearing as  tolerated      Mobility Bed Mobility Overal bed mobility: Needs Assistance Bed Mobility: Sit to Supine       Sit to supine: Max assist        Transfers Overall transfer level: Needs assistance   Transfers: Bed to chair/wheelchair/BSC     Squat pivot transfers: Min assist       General transfer comment: pt held bed rail and pivoted toward R side from chair to bed      Balance Overall balance assessment: Needs assistance   Sitting balance-Leahy Scale: Fair                                     ADL either performed or assessed with clinical judgement   ADL Overall ADL's : Needs assistance/impaired Eating/Feeding: Set up;Bed level   Grooming: Supervision/safety;Sitting;Wash/dry hands;Wash/dry face;Oral care   Upper Body Bathing: Moderate assistance;Sitting   Lower Body Bathing: Total assistance;Bed level   Upper Body Dressing : Moderate assistance;Sitting   Lower Body Dressing: Total assistance;Bed level                       Vision Ability to See in Adequate Light: 1 Impaired Patient Visual Report: No change from baseline Additional Comments: longstanding vision impairment in R eye     Perception Perception: Impaired   Perception-Other Comments: assist to orient clothing   Praxis         Pertinent Vitals/Pain Pain Assessment Pain Assessment: 0-10 Pain Score: 9  Pain Location: L hip Pain Descriptors / Indicators: Discomfort, Guarding Pain Intervention(s): Monitored during session,  Repositioned     Extremity/Trunk Assessment Upper Extremity Assessment Upper Extremity Assessment: Left hand dominant;LUE deficits/detail LUE Deficits / Details: amputated first finger, increased flexor tone, limited shoulder PROM to 90 degrees, no functional use LUE Sensation: decreased proprioception LUE Coordination: decreased fine motor;decreased gross motor   Lower Extremity Assessment Lower Extremity Assessment: Defer to PT evaluation    Cervical / Trunk Assessment Cervical / Trunk Assessment: Normal   Communication Communication Communication: Other (comment) (Pt speaks [place] and has daughter interpret.)   Cognition Arousal: Alert Behavior During Therapy: WFL for tasks assessed/performed Overall Cognitive Status: History of cognitive impairments - at baseline                                 General Comments: daughter reports pt requires reminders for doing his ROM exercises for L UE, does not wear his hand splint, family manages meds, appointments, finances     General Comments       Exercises     Shoulder Instructions      Home Living Family/patient expects to be discharged to:: Private residence Living Arrangements: Children;Other relatives Available Help at Discharge: Family;Personal care attendant;Available PRN/intermittently (PCS worker 2.5 hours 5 days a week) Type of Home: House Home Access: Stairs to enter [employer] of Steps: 3 Entrance Stairs-Rails: None Home Layout: One level     Bathroom Shower/Tub: Producer, television/film/video: Standard Bathroom Accessibility: No   Home Equipment: Cane - quad;Shower seat;Wheelchair - manual;BSC/3in1   Additional Comments: Pt had a previous CVA last year.      Prior Functioning/Environment Prior Level of Function : Needs assist             Mobility Comments: Pt daughter reports he was able to ambulate short distances around house with quad cane. ADLs Comments: Pt can self feed with set up, wash is face and use mouth wash, wash his front body and dress himself with assist to oriet clothing. He is assisted for washing his back and buttocks, shaving and all IADLs. Pt sits to shower with assist of his son in law weekly.        OT Problem List: Decreased strength;Impaired balance (sitting and/or standing);Decreased coordination;Impaired vision/perception;Decreased cognition;Decreased knowledge of use of DME or  AE;Pain;Impaired sensation;Impaired tone;Impaired UE functional use      OT Treatment/Interventions: Self-care/ADL training;DME and/or AE instruction;Therapeutic activities;Patient/family education;Balance training    OT Goals(Current goals can be found in the care plan section) Acute Rehab OT Goals OT Goal Formulation: With patient/family Time For Goal Achievement: 03/26/23 Potential to Achieve Goals: Good  OT Frequency: Min 1X/week    Co-evaluation              AM-PAC OT "6 Clicks" Daily Activity     Outcome Measure Help from another person eating meals?: A Little Help from another person taking care of personal grooming?: A Little Help from another person toileting, which includes using toliet, bedpan, or urinal?: Total Help from another person bathing (including washing, rinsing, drying)?: A Lot Help from another person to put on and taking off regular upper body clothing?: A Lot Help from another person to put on and taking off regular lower body clothing?: Total 6 Click Score: 12   End of Session Equipment Utilized During Treatment: Gait belt  Activity Tolerance: Patient tolerated treatment well Patient left: in bed;with call bell/phone within reach;with bed alarm set;with family/visitor present  OT Visit Diagnosis:  Unsteadiness on feet (R26.81);Pain;Hemiplegia and hemiparesis Hemiplegia - Right/Left: Left Hemiplegia - dominant/non-dominant: Non-Dominant Hemiplegia - caused by: Cerebral infarction Pain - Right/Left: Left Pain - part of body: Hip                Time: 1130-1202 OT Time Calculation (min): 32 min Charges:  OT General Charges $OT Visit: 1 Visit OT Evaluation $OT Eval Moderate Complexity: 1 Mod OT Treatments $Self Care/Home Management : 8-22 mins  Berna Spare, OTR/L Acute Rehabilitation Services Office: [phone]   Evern Bio 03/12/2023, 12:33 PM

## 2023-03-12 NOTE — Evaluation (Signed)
Physical Therapy Evaluation Patient Details Name: Douglas Pruitt MRN: [id number] DOB: 24-Aug-1950 Today's Date: 03/12/2023  History of Present Illness  Pt is a 72 year old man admitted on 8/12 after a fall in which he sustained a L intertrochanteric hip fx. Underwent IM nail 8/14. PMH: CVA with L residual hemiparesis, BPH, HTN, HLD.  Clinical Impression  Pt presents with admitting diagnosis above. Pt today was able to transfer to chair with Mod A 1 person HHA. Trialed platform RW however pt unable to tolerate LUE on platform. Pt currently unable to WB through LLE due to pain however pt daughter reports that he never put much weight through LLE at baseline due to previous CVA. Pt daughter provided subjective history and interpretation and reports that at baseline pt was able to ambulate short distances around house with quad cane. Recommend CIR upon DC as daughter reports that pt was a previous pt at CIR last year for CVA. PT will continue to follow.      If plan is discharge home, recommend the following: A lot of help with walking and/or transfers;A lot of help with bathing/dressing/bathroom;Assistance with cooking/housework;Direct supervision/assist for medications management;Assist for transportation;Help with stairs or ramp for entrance   Can travel by private vehicle        Equipment Recommendations Other (comment) (Per accepting facility)  Recommendations for Other Services  Rehab consult    Functional Status Assessment Patient has had a recent decline in their functional status and demonstrates the ability to make significant improvements in function in a reasonable and predictable amount of time.     Precautions / Restrictions Precautions Precautions: Fall Restrictions Weight Bearing Restrictions: Yes LLE Weight Bearing: Weight bearing as tolerated      Mobility  Bed Mobility Overal bed mobility: Needs Assistance Bed Mobility: Supine to Sit     Supine to sit: Contact guard, Min  assist, Used rails     General bed mobility comments: Pt able to sit EOB mostly on his own using bed rails however required hips to be scooted forward using pads.    Transfers Overall transfer level: Needs assistance Equipment used: 1 person hand held assist Transfers: Bed to chair/wheelchair/BSC, Sit to/from Stand Sit to Stand: Mod assist Stand pivot transfers: Min assist, Mod assist         General transfer comment: Initially trialed standing with platform RW however pt unable to tolerate L arm on platform. Opted for 1 person HHA for transfer to chair.    Ambulation/Gait               General Gait Details: deferred for safety  Stairs            Wheelchair Mobility     Tilt Bed    Modified Rankin (Stroke Patients Only)       Balance Overall balance assessment: Needs assistance Sitting-balance support: Single extremity supported Sitting balance-Leahy Scale: Fair     Standing balance support: Single extremity supported, During functional activity Standing balance-Leahy Scale: Poor Standing balance comment: Reliant on therapist.                             Pertinent Vitals/Pain Pain Assessment Pain Assessment: 0-10 Pain Score: 8  Pain Location: L hip Pain Descriptors / Indicators: Grimacing, Discomfort Pain Intervention(s): Monitored during session, Limited activity within patient's tolerance, Premedicated before session, Repositioned    Home Living Family/patient expects to be discharged to:: Private residence Living Arrangements: Children;Other  relatives Available Help at Discharge: Family;Personal care attendant;Available PRN/intermittently (PCA 2.5 hours a week for 5 days a week) Type of Home: House Home Access: Stairs to enter Entrance Stairs-Rails: None Entrance Stairs-Number of Steps: 3   Home Layout: One level Home Equipment: Cane - quad;Shower seat;Wheelchair - manual;BSC/3in1 Additional Comments: Pt had a previous CVA  last year.    Prior Function Prior Level of Function : Needs assist             Mobility Comments: Pt daughter reports he was able to ambulate short distances around house with quad cane. ADLs Comments: PCA helps with most ADLs however daughter encourages pt to do them on his own.     Extremity/Trunk Assessment   Upper Extremity Assessment Upper Extremity Assessment: Defer to OT evaluation LUE Deficits / Details: amputated first finger, increased flexor tone, limited shoulder PROM to 90 degrees, no functional use LUE Sensation: decreased proprioception LUE Coordination: decreased fine motor;decreased gross motor    Lower Extremity Assessment Lower Extremity Assessment: LLE deficits/detail LLE Deficits / Details: Residual CVA deficits and L hip fx    Cervical / Trunk Assessment Cervical / Trunk Assessment: Normal  Communication   Communication Communication: Other (comment) (Pt speaks Montagnard and has daughter interpret.)  Cognition Arousal: Alert Behavior During Therapy: WFL for tasks assessed/performed Overall Cognitive Status: Difficult to assess                                 General Comments: daughter reports pt requires reminders for doing his ROM exercises for L UE, does not wear his hand splint, family manages meds, appointments, finances        General Comments General comments (skin integrity, edema, etc.): HR up to 145 during initial sit to stand however quickly recovered to 90. RN made aware.    Exercises     Assessment/Plan    PT Assessment Patient needs continued PT services  PT Problem List Decreased strength;Decreased range of motion;Decreased activity tolerance;Decreased balance;Decreased mobility;Decreased coordination;Decreased knowledge of use of DME;Decreased safety awareness;Decreased knowledge of precautions;Cardiopulmonary status limiting activity;Pain       PT Treatment Interventions DME instruction;Stair training;Gait  training;Functional mobility training;Therapeutic activities;Therapeutic exercise;Balance training;Neuromuscular re-education;Patient/family education    PT Goals (Current goals can be found in the Care Plan section)  Acute Rehab PT Goals Patient Stated Goal: to go home PT Goal Formulation: With patient Time For Goal Achievement: 03/26/23 Potential to Achieve Goals: Fair    Frequency Min 1X/week     Co-evaluation               AM-PAC PT "6 Clicks" Mobility  Outcome Measure Help needed turning from your back to your side while in a flat bed without using bedrails?: A Little Help needed moving from lying on your back to sitting on the side of a flat bed without using bedrails?: A Little Help needed moving to and from a bed to a chair (including a wheelchair)?: A Lot Help needed standing up from a chair using your arms (e.g., wheelchair or bedside chair)?: A Lot Help needed to walk in hospital room?: Total Help needed climbing 3-5 steps with a railing? : Total 6 Click Score: 12    End of Session Equipment Utilized During Treatment: Gait belt Activity Tolerance: Patient tolerated treatment well;Patient limited by pain Patient left: in chair;with call bell/phone within reach;with chair alarm set;with family/visitor present Nurse Communication: Mobility status PT Visit Diagnosis:  Other abnormalities of gait and mobility (R26.89)    Time: 1610-9604 PT Time Calculation (min) (ACUTE ONLY): 47 min   Charges:   PT Evaluation $PT Eval High Complexity: 1 High PT Treatments $Therapeutic Activity: 23-37 mins PT General Charges $$ ACUTE PT VISIT: 1 Visit         Shela Nevin, PT, DPT Acute Rehab Services [phone]   Gladys Damme 03/12/2023, 12:52 PM

## 2023-03-12 NOTE — Evaluation (Signed)
Clinical/Bedside Swallow Evaluation Patient Details  Name: Douglas Pruitt MRN: [id number] Date of Birth: 1950-11-03  Today's Date: 03/12/2023 Time: SLP Start Time (ACUTE ONLY): 1000 SLP Stop Time (ACUTE ONLY): 1015 SLP Time Calculation (min) (ACUTE ONLY): 15 min  Past Medical History:  Past Medical History:  Diagnosis Date   Acute CVA (cerebrovascular accident) (HCC) 10/30/2021   CVA (cerebral vascular accident) (HCC) 10/22/2021   Hyperlipidemia    Hypertension    Past Surgical History:  Past Surgical History:  Procedure Laterality Date   EYE SURGERY     EYE SURGERY     work injury, left eye, blind in left eye   HPI:  Pt is a 72 year old man admitted on 8/12 after a fall in which he sustained a L intertrochanteric hip fx. Underwent IM nail 8/14. PMH: CVA with L residual hemiparesis, BPH, HTN, HLD.History of mild dysphagia while in AIR, but no MBS    Assessment / Plan / Recommendation  Clinical Impression  Pt demonstrates immediate coughing with thin liquids, also throat clearing. His daughter reports this has been a chronic problem. Denies pna. No instrumental testing of swallowing in pts chart. Given recent fall and decreased mobility in setting of persistent left sided weakness, recommend instrumental assessment of swallowing while admitted. Pt and daughter in agreement. WIll plan MBS tomorrow while daughter is available who is acting as Bernie interpreter for pt who is Montangard speaking. Pt may continue diet, but will modify to dys 3 since pt eats softer foods at baseline (no dentition). SLP Visit Diagnosis: Dysphagia, unspecified (R13.10)    Aspiration Risk  Mild aspiration risk    Diet Recommendation Thin liquid;Dysphagia 3 (Mech soft)    Liquid Administration via: Cup;Straw Medication Administration: Whole meds with liquid Supervision: Patient able to self feed Compensations: Slow rate;Small sips/bites    Other  Recommendations      Recommendations for follow up therapy are one  component of a multi-disciplinary discharge planning process, led by the attending physician.  Recommendations may be updated based on patient status, additional functional criteria and insurance authorization.  Follow up Recommendations        Assistance Recommended at Discharge    Functional Status Assessment    Frequency and Duration            Prognosis        Swallow Study   General HPI: Pt is a 72 year old man admitted on 8/12 after a fall in which he sustained a L intertrochanteric hip fx. Underwent IM nail 8/14. PMH: CVA with L residual hemiparesis, BPH, HTN, HLD.History of mild dysphagia while in AIR, but no MBS Type of Study: Bedside Swallow Evaluation Previous Swallow Assessment: CIR - 4/7 Diet Prior to this Study: Regular;Thin liquids (Level 0) Temperature Spikes Noted: No Respiratory Status: Room air History of Recent Intubation: No Behavior/Cognition: Alert;Cooperative;Pleasant mood Oral Cavity Assessment: Within Functional Limits Oral Care Completed by SLP: No Oral Cavity - Dentition: Edentulous Vision: Functional for self-feeding Self-Feeding Abilities: Able to feed self Patient Positioning: Upright in chair Baseline Vocal Quality: Normal Volitional Cough: Strong Volitional Swallow: Able to elicit    Oral/Motor/Sensory Function Overall Oral Motor/Sensory Function: Mild impairment Facial ROM: Suspected CN VII (facial) dysfunction;Reduced left Facial Symmetry: Within Functional Limits Facial Strength: Within Functional Limits Facial Sensation: Within Functional Limits Lingual ROM: Within Functional Limits Lingual Symmetry: Within Functional Limits Lingual Strength: Within Functional Limits Lingual Sensation: Within Functional Limits Velum: Within Functional Limits Mandible: Within Functional Limits   Ice Chips  Ice chips: Not tested   Thin Liquid Thin Liquid: Impaired Presentation: Straw;Self Fed Pharyngeal  Phase Impairments: Cough - Immediate;Throat  Clearing - Immediate    Nectar Thick Nectar Thick Liquid: Not tested   Honey Thick Honey Thick Liquid: Not tested   Puree Puree: Not tested   Solid     Solid: Not tested      Claudine Mouton 03/12/2023,12:49 PM

## 2023-03-12 NOTE — Progress Notes (Signed)
Inpatient Rehab Admissions Coordinator:   Per therapy recommendations, patient was screened for CIR candidacy by Laura Staley, MS, CCC-SLP. At this time, Pt. does not appear to demonstrate medical necessity to justify in hospital rehabilitation/CIR. will not pursue a rehab consult for this Pt.   Recommend other rehab venues to be pursued.  Please contact me with any questions.  Laura Staley, MS, CCC-SLP Rehab Admissions Coordinator  [phone] (celll) [phone] (office)   

## 2023-03-12 NOTE — TOC CM/SW Note (Addendum)
Transition of Care [facility]) - Inpatient Brief Assessment   Patient Details  Name: Douglas Pruitt MRN: [id number] Date of Birth: 26-Jun-1951  [facility]) CM/SW Contact:    Epifanio Lesches, RN Phone Number: 03/12/2023, 8:24 AM   Clinical Narrative: Presents with L hip fx , s/p fall. Hx of HTN, hyperlipidemia, CVA/left residual hemiparesis, carotid artery occlusion. Montagnard speaking. Adult children.   - S/p INTRAMEDULLARY NAILING OF THE LEFT HIP, 8/13  OT evaluation pending....  TOC team following for needs  Transition of Care Asessment: Insurance and Status: Insurance coverage has been reviewed Patient has primary care physician: Yes Pt from home with children , daughter Shari Heritage in Social worker. Prior level of function:: PTA independent with ADL's Prior/Current Home Services: No current home services Social Determinants of Health Reivew: SDOH reviewed no interventions necessary Readmission risk has been reviewed: No Transition of care needs: no transition of care needs at this time

## 2023-03-13 ENCOUNTER — Inpatient Hospital Stay (HOSPITAL_COMMUNITY): Payer: Medicare Other

## 2023-03-13 DIAGNOSIS — S72142A Displaced intertrochanteric fracture of left femur, initial encounter for closed fracture: Secondary | ICD-10-CM | POA: Diagnosis not present

## 2023-03-13 LAB — CBC
HCT: 22.3 % — ABNORMAL LOW (ref 39.0–52.0)
Hemoglobin: 7.4 g/dL — ABNORMAL LOW (ref 13.0–17.0)
MCH: 28 pg (ref 26.0–34.0)
MCHC: 33.2 g/dL (ref 30.0–36.0)
MCV: 84.5 fL (ref 80.0–100.0)
Platelets: 84 10*3/uL — ABNORMAL LOW (ref 150–400)
RBC: 2.64 MIL/uL — ABNORMAL LOW (ref 4.22–5.81)
RDW: 13.4 % (ref 11.5–15.5)
WBC: 10.9 10*3/uL — ABNORMAL HIGH (ref 4.0–10.5)
nRBC: 0 % (ref 0.0–0.2)

## 2023-03-13 LAB — HEMOGLOBIN AND HEMATOCRIT, BLOOD
HCT: 20.8 % — ABNORMAL LOW (ref 39.0–52.0)
Hemoglobin: 7.1 g/dL — ABNORMAL LOW (ref 13.0–17.0)

## 2023-03-13 LAB — BASIC METABOLIC PANEL
Anion gap: 8 (ref 5–15)
BUN: 19 mg/dL (ref 8–23)
CO2: 30 mmol/L (ref 22–32)
Calcium: 8.1 mg/dL — ABNORMAL LOW (ref 8.9–10.3)
Chloride: 97 mmol/L — ABNORMAL LOW (ref 98–111)
Creatinine, Ser: 0.93 mg/dL (ref 0.61–1.24)
GFR, Estimated: >60 mL/min (ref >60)
Glucose, Bld: 107 mg/dL — ABNORMAL HIGH (ref 70–99)
Potassium: 3.8 mmol/L (ref 3.5–5.1)
Sodium: 135 mmol/L (ref 135–145)

## 2023-03-13 LAB — PREALBUMIN: Prealbumin: 12 mg/dL — ABNORMAL LOW (ref 18–38)

## 2023-03-13 LAB — PREPARE RBC (CROSSMATCH)

## 2023-03-13 MED ORDER — ACETAMINOPHEN 325 MG PO TABS
650.0000 mg | ORAL_TABLET | Freq: Once | ORAL | Status: AC
  Administered 2023-03-13: 650 mg via ORAL
  Filled 2023-03-13: qty 2

## 2023-03-13 MED ORDER — SODIUM CHLORIDE 0.9% IV SOLUTION: Freq: Once | INTRAVENOUS | Status: AC

## 2023-03-13 MED ORDER — ADULT MULTIVITAMIN W/MINERALS CH: 1.0000 | ORAL_TABLET | Freq: Every day | ORAL | 0 refills | Status: AC

## 2023-03-13 MED ORDER — FERROUS SULFATE 325 (65 FE) MG PO TBEC: 325.0000 mg | DELAYED_RELEASE_TABLET | Freq: Two times a day (BID) | ORAL | 3 refills | Status: DC

## 2023-03-13 MED ORDER — ENOXAPARIN SODIUM 40 MG/0.4ML IJ SOSY: 40.0000 mg | PREFILLED_SYRINGE | INTRAMUSCULAR | 0 refills | Status: AC

## 2023-03-13 NOTE — Care Management Important Message (Signed)
Important Message  Patient Details  Name: Douglas Pruitt MRN: [id number] Date of Birth: [birth year]   Medicare Important Message Given:  Yes     Tabb Noriega 03/13/2023, 12:55 PM

## 2023-03-13 NOTE — TOC Transition Note (Addendum)
Transition of Care [facility]) - CM/SW Discharge Note   Patient Details  Name: Douglas Pruitt MRN: [id number] Date of Birth: [birth year]  Transition of [facility]) CM/SW Contact:  Lorri Frederick, LCSW Phone Number: 03/13/2023, 1:08 PM   Clinical Narrative:   Pt discharging to [facility], room 404B.  RN call report to [phone].    Final next level of care: Skilled Nursing Facility Barriers to Discharge: Barriers Resolved   Patient Goals and CMS Choice   Choice offered to / list presented to : Adult Children (daughter [name])  Discharge Placement                Patient chooses bed at: [facility] Patient to be transferred to facility by: [facility] Name of family member notified: daughter [name] Patient and family notified of of transfer: 03/13/23  Discharge Plan and Services Additional resources added to the After Visit Summary for   In-house Referral: Clinical Social Work   Post Acute Care Choice: Skilled Nursing Facility                               Social Determinants of Health (SDOH) Interventions SDOH Screenings   Food Insecurity: No Food Insecurity (03/10/2023)  Housing: Low Risk  (03/10/2023)  Transportation Needs: No Transportation Needs (03/10/2023)  Utilities: Not At Risk (03/10/2023)  Depression (PHQ2-9): Low Risk  (11/27/2022)  Recent Concern: Depression (PHQ2-9) - High Risk (09/24/2022)  Financial Resource Strain: Low Risk  (11/27/2022)  Physical Activity: Inactive (11/27/2022)  Social Connections: Unknown (10/29/2021)  Stress: Stress Concern Present (11/27/2022)  Tobacco Use: Low Risk  (03/11/2023)     Readmission Risk Interventions     No data to display

## 2023-03-13 NOTE — TOC Initial Note (Addendum)
Transition of Care [facility]) - Initial/Assessment Note    Patient Details  Name: Douglas Pruitt MRN: [id number] Date of Birth: Nov 11, 1950  Transition of Care [facility]) CM/SW Contact:    Lorri Frederick, LCSW Phone Number: 03/13/2023, 10:33 AM  Clinical Narrative:        Pt listed as oriented x4, quite sleepy, information from daughter [name], also in room.  Discussed DC plan, [name] not aware the CIR unable to take pt, is agreeable to SNF since CIR is not option.  Pt was at [facility] for about 6 months in 2023, currently lives with [name] and her family.  [facility] involved recently but not currently.  [name] would like pt to return to [facility] as he is familiar with their staff.  Permission given to send out referral in hub.  Referral sent to [facility], CSW reached out to them to review referral.       Medicare payer: Inpatient order 8/12.    1045: [facility] can accept pt. They are able to receive pt today.  MD notified.     Expected Discharge Plan: Skilled Nursing Facility Barriers to Discharge: Continued Medical Work up, SNF Pending bed offer   Patient Goals and CMS Choice     Choice offered to / list presented to : Adult Children (daughter [name])      Expected Discharge Plan and Services In-house Referral: Clinical Social Work   Post Acute Care Choice: Skilled Nursing Facility Living arrangements for the past 2 months: Single Family Home                                      Prior Living Arrangements/Services Living arrangements for the past 2 months: Single Family Home Lives with:: Adult Children (lives with daughter [name] and her family) Patient language and need for interpreter reviewed:: Yes (information from daughter [name])        Need for Family Participation in Patient Care: Yes (Comment) Care giver support system in place?: Yes (comment) Current home services: Other (comment) (none current, [facility] recently) Criminal Activity/Legal Involvement Pertinent to Current  Situation/Hospitalization: No - Comment as needed  Activities of Daily Living Home Assistive Devices/Equipment: Cane (specify quad or straight), Shower chair with back, Bedside commode/3-in-1 ADL Screening (condition at time of admission) Patient's cognitive ability adequate to safely complete daily activities?: No Is the patient deaf or have difficulty hearing?: Yes Does the patient have difficulty seeing, even when wearing glasses/contacts?: Yes Does the patient have difficulty concentrating, remembering, or making decisions?: Yes Patient able to express need for assistance with ADLs?: Yes Does the patient have difficulty dressing or bathing?: Yes Independently performs ADLs?: No Communication: Needs assistance Is this a change from baseline?: Pre-admission baseline Dressing (OT): Needs assistance Is this a change from baseline?: Pre-admission baseline Grooming: Needs assistance Is this a change from baseline?: Pre-admission baseline Feeding: Independent Bathing: Needs assistance Is this a change from baseline?: Pre-admission baseline Toileting: Independent In/Out Bed: Independent Walks in Home: Independent with device (comment) (cane) Does the patient have difficulty walking or climbing stairs?: Yes Weakness of Legs: Left Weakness of Arms/Hands: Left  Permission Sought/Granted                  Emotional Assessment Appearance:: Appears stated age Attitude/Demeanor/Rapport: Unable to Assess Affect (typically observed): Unable to Assess Orientation: : Oriented to Self, Oriented to Place, Oriented to  Time, Oriented to Situation  Admission diagnosis:  Closed intertrochanteric fracture of hip, left, initial encounter [facility]) [S72.142A] Patient Active Problem List   Diagnosis Date Noted   Closed intertrochanteric fracture of hip, left, initial encounter (HCC) 03/10/2023   Normocytic anemia 03/10/2023   Thrombocytopenia (HCC) 03/10/2023   History of CVA with residual  deficit 03/10/2023   BPH (benign prostatic hyperplasia) 03/10/2023   Hemiparesis affecting left side as late effect of cerebrovascular accident (HCC) 01/13/2023   Fall at home, initial encounter 01/13/2023   Encounter for immunization 01/13/2023   Right middle cerebral artery stroke (HCC) 11/01/2021   Hyponatremia 10/31/2021   Hypokalemia 10/31/2021   Hyperbilirubinemia 10/31/2021   Leukocytosis 10/31/2021   Stroke-like symptoms 10/30/2021   DNR (do not resuscitate) 10/30/2021   Carotid stenosis 10/23/2021   Stroke (cerebrum) (HCC) 10/22/2021   Memory changes 08/23/2019   Fatigue 08/23/2019   Abnormal glucose 03/11/2019   Essential hypertension 09/10/2018   Mixed hyperlipidemia 09/10/2018   Vision loss, left eye 09/10/2018   Aphakia of left eye 05/30/2011   Posttraumatic aniridia 05/30/2011   PCP:  [name], FNP Pharmacy:   [facility] [phone] - [place], [place] - [address] AT [facility] [address] [place] [address] Phone: [phone] Fax: [phone]     Social Determinants of Health (SDOH) Social History: SDOH Screenings   Food Insecurity: No Food Insecurity (03/10/2023)  Housing: Low Risk  (03/10/2023)  Transportation Needs: No Transportation Needs (03/10/2023)  Utilities: Not At Risk (03/10/2023)  Depression (PHQ2-9): Low Risk  (11/27/2022)  Recent Concern: Depression (PHQ2-9) - High Risk (09/24/2022)  Financial Resource Strain: Low Risk  (11/27/2022)  Physical Activity: Inactive (11/27/2022)  Social Connections: Unknown (10/29/2021)  Stress: Stress Concern Present (11/27/2022)  Tobacco Use: Low Risk  (03/11/2023)   SDOH Interventions:     Readmission Risk Interventions     No data to display

## 2023-03-13 NOTE — Discharge Instructions (Signed)
Ortho DC instructions   Weightbearing as tolerated Left leg Pt has baseline foot drop from previous stroke Will be fitted for AFO   Dressing changes Left hip daily.  4x4 gauze and tape or silicone foam dressing  Ok to leave open to air once there is no drainage Ok to clean with soap and water only once there is no drainage

## 2023-03-13 NOTE — Progress Notes (Signed)
This nurse obtained blood consent via patient daughter who was at bedside. Pt is confused and unable to consent. Consent placed in pt chart.

## 2023-03-13 NOTE — Plan of Care (Signed)

## 2023-03-13 NOTE — Progress Notes (Signed)
Modified Barium Swallow Study  Patient Details  Name: Douglas Pruitt MRN: [id number] Date of Birth: 08-Jan-1951  Today's Date: 03/13/2023  Modified Barium Swallow completed.  Full report located under Chart Review in the Imaging Section.  History of Present Illness Pt is a 72 year old man admitted on 8/12 after a fall in which he sustained a L intertrochanteric hip fx. Underwent IM nail 8/14. PMH: CVA with L residual hemiparesis, BPH, HTN, HLD.History of mild dysphagia while in AIR, but no MBS   Clinical Impression Pt demonstrates mild oropharyngeal dysphagia; there is no dentition and pt has prolonged mastication and mild oral residue along floor of mouth with thin liquids. Swallow is initiated at the pyriform sinuses which are quite deep; there is trace flash penetration of thin liquids but no aspiration. Pt has appearance of osteophytes on cervical spine that do not cause residue. Esophageal sweep WNL though extra sips needed to transit pill fully. Pt may continue diet, but will change to dys 2/thin as foods are still a little to hard to masticate. No SLP f/u needed. Factors that may increase risk of adverse event in presence of aspiration [employer] 2021):    Swallow Evaluation Recommendations Liquid Administration via: Cup;Straw Medication Administration: Whole meds with puree Supervision: Patient able to self-feed      Juanpablo Ciresi, Riley Nearing 03/13/2023,11:37 AM

## 2023-03-13 NOTE — Discharge Summary (Addendum)
Physician Discharge Summary  Douglas Pruitt [id number] DOB: Dec 09, 1950 DOA: 03/10/2023  PCP: Douglas Felts, FNP  Admit date: 03/10/2023 Discharge date: 03/14/2023  Admitted From: Home Disposition: SNF  Recommendations for Outpatient Follow-up:  Follow up with PCP in 1-2 weeks Follow-up with orthopedics as scheduled  Discharge Condition: Stable CODE STATUS: Full Diet recommendation: Low-salt low-fat diet  Brief/Interim Summary: Douglas Pruitt is Douglas Pruitt 72 y.o. male Douglas Pruitt speaking male with past medical history of essential hypertension hyperlipidemia CVA with left residual hemiparesis carotid artery occlusion presents after mechanical fall with left hip pain imaging confirmed left intertrochanteric fracture.   Patient admitted as above with closed left intertrochanteric hip fracture, status post ORIF with IM nail on 03/11/2023.  Patient evaluated by physical therapy recommending SNF placement which has already been approved.  At this time is otherwise stable and agreeable for discharge.  Of note patient's leukocytosis appears to be reactive and downtrending appropriately, if signs or symptoms of infection would certainly discuss case with orthopedics or return to the hospital for further evaluation and treatment.  During hospitalization patient was noted to have iron deficiency anemia status post IV iron, will discharge on p.o. iron, recommend rechecking labs in the next few weeks.  Vitamin D also noted to be low, discharged on multivitamin.  Discharge Diagnoses:  Principal Problem:   Closed intertrochanteric fracture of hip, left, initial encounter [facility]) Active Problems:   Fall at home, initial encounter   Leukocytosis   Normocytic anemia   Thrombocytopenia (HCC)   Essential hypertension   History of CVA with residual deficit   Mixed hyperlipidemia   BPH (benign prostatic hyperplasia)  Closed intertrochanteric fracture of hip, left, initial encounter [facility]) Ambulatory dysfunction, frequent  falls Orthopedics following - IM nail successfully placed 03/11/2023 without complication Continue narcotics per orthopedic surgery. Anticoagulation per orthopedics - currently on asa 81, lovenox dvt prevention  PT OT following - planned dispo to SNF   Leukocytosis likely reactive: No source of infection has remained afebrile continue monitor. Leukocytosis downtrending appropriately with supportive care only   Normocytic anemia/iron deficiency anemia/thrombocytopenia: Previous platelet count 109, iron panel consistent with iron deficiency anemia. S/P IV iron --> continue oral iron as Douglas Pruitt outpatient. HIV negative. No signs of overt bleeding.   Essential hypertension: At goal - minimal elevation secondary to prior pain.   History of CVA with residual deficits: Continue aspirin and statin   Hyperlipidemia Continue statins   BPH Continue Flomax.  Discharge Instructions   Allergies as of 03/13/2023   No Known Allergies      Medication List     TAKE these medications    aspirin EC 81 MG tablet Take 1 tablet (81 mg total) by mouth daily. Swallow whole. What changed: additional instructions   atorvastatin 40 MG tablet Commonly known as: LIPITOR TAKE 1 TABLET(40 MG) BY MOUTH AT BEDTIME What changed: See the new instructions.   baclofen 10 MG tablet Commonly known as: LIORESAL TAKE 1 TABLET(10 MG) BY MOUTH TWICE DAILY What changed: See the new instructions.   chlorhexidine 4 % external liquid Commonly known as: HIBICLENS Apply 15 mLs (1 Application total) topically as directed for 30 doses. Use as directed daily for 5 days every other week for 6 weeks.   docusate sodium 100 MG capsule Commonly known as: Colace Take 1 capsule (100 mg total) by mouth daily as needed. What changed: reasons to take this   ferrous sulfate 325 (65 FE) MG EC tablet Take 1 tablet (325 mg total) by mouth 2 (  two) times daily.   gabapentin 300 MG capsule Commonly known as: NEURONTIN TAKE 1  CAPSULE(300 MG) BY MOUTH DAILY What changed: See the new instructions.   lisinopril 2.5 MG tablet Commonly known as: ZESTRIL TAKE 1 TABLET(2.5 MG) BY MOUTH DAILY What changed: See the new instructions.   multivitamin with minerals Tabs tablet Take 1 tablet by mouth daily. Start taking on: March 14, 2023   mupirocin ointment 2 % Commonly known as: BACTROBAN Place 1 Application into the nose 2 (two) times daily for 60 doses. Use as directed 2 times daily for 5 days every other week for 6 weeks.   tamsulosin 0.4 MG Caps capsule Commonly known as: FLOMAX Take 1 capsule (0.4 mg total) by mouth daily after supper.        Follow-up Information     Douglas Felts, FNP Follow up.   Specialty: General Practice Contact information: [address] [place] [address] [phone]                No Known Allergies  Consultations: Orthopedic surgery  Procedures/Studies: DG Swallowing Func-Speech Pathology  Result Date: 03/13/2023 Table formatting from the original result was not included. Modified Barium Swallow Study Patient Details Name: Douglas Pruitt MRN: [id number] Date of Birth: 06-07-1951 Today's Date: 03/13/2023 HPI/PMH: HPI: Pt is a 72 year old man admitted on 8/12 after a fall in which he sustained a L intertrochanteric hip fx. Underwent IM nail 8/14. PMH: CVA with L residual hemiparesis, BPH, HTN, HLD.History of mild dysphagia while in AIR, but no MBS Clinical Impression: Clinical Impression: Pt demonstrates mild oropharyngeal dysphagia; there is no dentition and pt has prolonged mastication and mild oral residue along floor of mouth with thin liquids. Swallow is initiated at the pyriform sinuses which are quite deep; there is trace flash penetration of thin liquids but no aspiration. Pt has appearance of osteophytes on cervical spine that do not cause residue. Esophageal sweep WNL though extra sips needed to transit pill fully. Pt may continue diet, but will change  to dys 2/thin as foods are still a little to hard to masticate. No SLP f/u needed. Factors that may increase risk of adverse event in presence of aspiration Douglas Pruitt & Douglas Coots 2021): No data recorded Recommendations/Plan: Swallowing Evaluation Recommendations Swallowing Evaluation Recommendations Liquid Administration via: Cup; Straw Medication Administration: Whole meds with puree Supervision: Patient able to self-feed Treatment Plan Treatment Plan Treatment recommendations: No treatment recommended at this time Recommendations Recommendations for follow up therapy are one component of a multi-disciplinary discharge planning process, led by the attending physician.  Recommendations may be updated based on patient status, additional functional criteria and insurance authorization. Assessment: Orofacial Exam: Orofacial Exam Oral Cavity - Dentition: Edentulous Orofacial Anatomy: WFL Anatomy: No data recorded Boluses Administered: Boluses Administered Boluses Administered: Thin liquids (Level 0); Mildly thick liquids (Level 2, nectar thick); Moderately thick liquids (Level 3, honey thick); Puree; Solid  Oral Impairment Domain: Oral Impairment Domain Lip Closure: Interlabial escape, no progression to anterior lip Tongue control during bolus hold: Escape to lateral buccal cavity/floor of mouth Bolus preparation/mastication: Slow prolonged chewing/mashing with complete recollection Bolus transport/lingual motion: Delayed initiation of tongue motion (oral holding) Oral residue: Trace residue lining oral structures Initiation of pharyngeal swallow : Pyriform sinuses  Pharyngeal Impairment Domain: Pharyngeal Impairment Domain Soft palate elevation: No bolus between soft palate (SP)/pharyngeal wall (PW) Laryngeal elevation: Complete superior movement of thyroid cartilage with complete approximation of arytenoids to epiglottic petiole Anterior hyoid excursion: Complete anterior movement  two) times daily.   gabapentin 300 MG capsule Commonly known as: NEURONTIN TAKE 1  CAPSULE(300 MG) BY MOUTH DAILY What changed: See the new instructions.   lisinopril 2.5 MG tablet Commonly known as: ZESTRIL TAKE 1 TABLET(2.5 MG) BY MOUTH DAILY What changed: See the new instructions.   multivitamin with minerals Tabs tablet Take 1 tablet by mouth daily. Start taking on: March 14, 2023   mupirocin ointment 2 % Commonly known as: BACTROBAN Place 1 Application into the nose 2 (two) times daily for 60 doses. Use as directed 2 times daily for 5 days every other week for 6 weeks.   tamsulosin 0.4 MG Caps capsule Commonly known as: FLOMAX Take 1 capsule (0.4 mg total) by mouth daily after supper.        Follow-up Information     Douglas Felts, FNP Follow up.   Specialty: General Practice Contact information: [address] [place] [address] [phone]                No Known Allergies  Consultations: Orthopedic surgery  Procedures/Studies: DG Swallowing Func-Speech Pathology  Result Date: 03/13/2023 Table formatting from the original result was not included. Modified Barium Swallow Study Patient Details Name: Douglas Pruitt MRN: [id number] Date of Birth: 06-07-1951 Today's Date: 03/13/2023 HPI/PMH: HPI: Pt is a 72 year old man admitted on 8/12 after a fall in which he sustained a L intertrochanteric hip fx. Underwent IM nail 8/14. PMH: CVA with L residual hemiparesis, BPH, HTN, HLD.History of mild dysphagia while in AIR, but no MBS Clinical Impression: Clinical Impression: Pt demonstrates mild oropharyngeal dysphagia; there is no dentition and pt has prolonged mastication and mild oral residue along floor of mouth with thin liquids. Swallow is initiated at the pyriform sinuses which are quite deep; there is trace flash penetration of thin liquids but no aspiration. Pt has appearance of osteophytes on cervical spine that do not cause residue. Esophageal sweep WNL though extra sips needed to transit pill fully. Pt may continue diet, but will change  to dys 2/thin as foods are still a little to hard to masticate. No SLP f/u needed. Factors that may increase risk of adverse event in presence of aspiration Douglas Pruitt & Douglas Coots 2021): No data recorded Recommendations/Plan: Swallowing Evaluation Recommendations Swallowing Evaluation Recommendations Liquid Administration via: Cup; Straw Medication Administration: Whole meds with puree Supervision: Patient able to self-feed Treatment Plan Treatment Plan Treatment recommendations: No treatment recommended at this time Recommendations Recommendations for follow up therapy are one component of a multi-disciplinary discharge planning process, led by the attending physician.  Recommendations may be updated based on patient status, additional functional criteria and insurance authorization. Assessment: Orofacial Exam: Orofacial Exam Oral Cavity - Dentition: Edentulous Orofacial Anatomy: WFL Anatomy: No data recorded Boluses Administered: Boluses Administered Boluses Administered: Thin liquids (Level 0); Mildly thick liquids (Level 2, nectar thick); Moderately thick liquids (Level 3, honey thick); Puree; Solid  Oral Impairment Domain: Oral Impairment Domain Lip Closure: Interlabial escape, no progression to anterior lip Tongue control during bolus hold: Escape to lateral buccal cavity/floor of mouth Bolus preparation/mastication: Slow prolonged chewing/mashing with complete recollection Bolus transport/lingual motion: Delayed initiation of tongue motion (oral holding) Oral residue: Trace residue lining oral structures Initiation of pharyngeal swallow : Pyriform sinuses  Pharyngeal Impairment Domain: Pharyngeal Impairment Domain Soft palate elevation: No bolus between soft palate (SP)/pharyngeal wall (PW) Laryngeal elevation: Complete superior movement of thyroid cartilage with complete approximation of arytenoids to epiglottic petiole Anterior hyoid excursion: Complete anterior movement  Physician Discharge Summary  Douglas Pruitt [id number] DOB: Dec 09, 1950 DOA: 03/10/2023  PCP: Douglas Felts, FNP  Admit date: 03/10/2023 Discharge date: 03/14/2023  Admitted From: Home Disposition: SNF  Recommendations for Outpatient Follow-up:  Follow up with PCP in 1-2 weeks Follow-up with orthopedics as scheduled  Discharge Condition: Stable CODE STATUS: Full Diet recommendation: Low-salt low-fat diet  Brief/Interim Summary: Douglas Pruitt is Douglas Pruitt 72 y.o. male Douglas Pruitt speaking male with past medical history of essential hypertension hyperlipidemia CVA with left residual hemiparesis carotid artery occlusion presents after mechanical fall with left hip pain imaging confirmed left intertrochanteric fracture.   Patient admitted as above with closed left intertrochanteric hip fracture, status post ORIF with IM nail on 03/11/2023.  Patient evaluated by physical therapy recommending SNF placement which has already been approved.  At this time is otherwise stable and agreeable for discharge.  Of note patient's leukocytosis appears to be reactive and downtrending appropriately, if signs or symptoms of infection would certainly discuss case with orthopedics or return to the hospital for further evaluation and treatment.  During hospitalization patient was noted to have iron deficiency anemia status post IV iron, will discharge on p.o. iron, recommend rechecking labs in the next few weeks.  Vitamin D also noted to be low, discharged on multivitamin.  Discharge Diagnoses:  Principal Problem:   Closed intertrochanteric fracture of hip, left, initial encounter [facility]) Active Problems:   Fall at home, initial encounter   Leukocytosis   Normocytic anemia   Thrombocytopenia (HCC)   Essential hypertension   History of CVA with residual deficit   Mixed hyperlipidemia   BPH (benign prostatic hyperplasia)  Closed intertrochanteric fracture of hip, left, initial encounter [facility]) Ambulatory dysfunction, frequent  falls Orthopedics following - IM nail successfully placed 03/11/2023 without complication Continue narcotics per orthopedic surgery. Anticoagulation per orthopedics - currently on asa 81, lovenox dvt prevention  PT OT following - planned dispo to SNF   Leukocytosis likely reactive: No source of infection has remained afebrile continue monitor. Leukocytosis downtrending appropriately with supportive care only   Normocytic anemia/iron deficiency anemia/thrombocytopenia: Previous platelet count 109, iron panel consistent with iron deficiency anemia. S/P IV iron --> continue oral iron as Douglas Pruitt outpatient. HIV negative. No signs of overt bleeding.   Essential hypertension: At goal - minimal elevation secondary to prior pain.   History of CVA with residual deficits: Continue aspirin and statin   Hyperlipidemia Continue statins   BPH Continue Flomax.  Discharge Instructions   Allergies as of 03/13/2023   No Known Allergies      Medication List     TAKE these medications    aspirin EC 81 MG tablet Take 1 tablet (81 mg total) by mouth daily. Swallow whole. What changed: additional instructions   atorvastatin 40 MG tablet Commonly known as: LIPITOR TAKE 1 TABLET(40 MG) BY MOUTH AT BEDTIME What changed: See the new instructions.   baclofen 10 MG tablet Commonly known as: LIORESAL TAKE 1 TABLET(10 MG) BY MOUTH TWICE DAILY What changed: See the new instructions.   chlorhexidine 4 % external liquid Commonly known as: HIBICLENS Apply 15 mLs (1 Application total) topically as directed for 30 doses. Use as directed daily for 5 days every other week for 6 weeks.   docusate sodium 100 MG capsule Commonly known as: Colace Take 1 capsule (100 mg total) by mouth daily as needed. What changed: reasons to take this   ferrous sulfate 325 (65 FE) MG EC tablet Take 1 tablet (325 mg total) by mouth 2 (  Physician Discharge Summary  Douglas Pruitt [id number] DOB: Dec 09, 1950 DOA: 03/10/2023  PCP: Douglas Felts, FNP  Admit date: 03/10/2023 Discharge date: 03/14/2023  Admitted From: Home Disposition: SNF  Recommendations for Outpatient Follow-up:  Follow up with PCP in 1-2 weeks Follow-up with orthopedics as scheduled  Discharge Condition: Stable CODE STATUS: Full Diet recommendation: Low-salt low-fat diet  Brief/Interim Summary: Douglas Pruitt is Douglas Pruitt 72 y.o. male Douglas Pruitt speaking male with past medical history of essential hypertension hyperlipidemia CVA with left residual hemiparesis carotid artery occlusion presents after mechanical fall with left hip pain imaging confirmed left intertrochanteric fracture.   Patient admitted as above with closed left intertrochanteric hip fracture, status post ORIF with IM nail on 03/11/2023.  Patient evaluated by physical therapy recommending SNF placement which has already been approved.  At this time is otherwise stable and agreeable for discharge.  Of note patient's leukocytosis appears to be reactive and downtrending appropriately, if signs or symptoms of infection would certainly discuss case with orthopedics or return to the hospital for further evaluation and treatment.  During hospitalization patient was noted to have iron deficiency anemia status post IV iron, will discharge on p.o. iron, recommend rechecking labs in the next few weeks.  Vitamin D also noted to be low, discharged on multivitamin.  Discharge Diagnoses:  Principal Problem:   Closed intertrochanteric fracture of hip, left, initial encounter [facility]) Active Problems:   Fall at home, initial encounter   Leukocytosis   Normocytic anemia   Thrombocytopenia (HCC)   Essential hypertension   History of CVA with residual deficit   Mixed hyperlipidemia   BPH (benign prostatic hyperplasia)  Closed intertrochanteric fracture of hip, left, initial encounter [facility]) Ambulatory dysfunction, frequent  falls Orthopedics following - IM nail successfully placed 03/11/2023 without complication Continue narcotics per orthopedic surgery. Anticoagulation per orthopedics - currently on asa 81, lovenox dvt prevention  PT OT following - planned dispo to SNF   Leukocytosis likely reactive: No source of infection has remained afebrile continue monitor. Leukocytosis downtrending appropriately with supportive care only   Normocytic anemia/iron deficiency anemia/thrombocytopenia: Previous platelet count 109, iron panel consistent with iron deficiency anemia. S/P IV iron --> continue oral iron as Douglas Pruitt outpatient. HIV negative. No signs of overt bleeding.   Essential hypertension: At goal - minimal elevation secondary to prior pain.   History of CVA with residual deficits: Continue aspirin and statin   Hyperlipidemia Continue statins   BPH Continue Flomax.  Discharge Instructions   Allergies as of 03/13/2023   No Known Allergies      Medication List     TAKE these medications    aspirin EC 81 MG tablet Take 1 tablet (81 mg total) by mouth daily. Swallow whole. What changed: additional instructions   atorvastatin 40 MG tablet Commonly known as: LIPITOR TAKE 1 TABLET(40 MG) BY MOUTH AT BEDTIME What changed: See the new instructions.   baclofen 10 MG tablet Commonly known as: LIORESAL TAKE 1 TABLET(10 MG) BY MOUTH TWICE DAILY What changed: See the new instructions.   chlorhexidine 4 % external liquid Commonly known as: HIBICLENS Apply 15 mLs (1 Application total) topically as directed for 30 doses. Use as directed daily for 5 days every other week for 6 weeks.   docusate sodium 100 MG capsule Commonly known as: Colace Take 1 capsule (100 mg total) by mouth daily as needed. What changed: reasons to take this   ferrous sulfate 325 (65 FE) MG EC tablet Take 1 tablet (325 mg total) by mouth 2 (  Physician Discharge Summary  Douglas Pruitt [id number] DOB: Dec 09, 1950 DOA: 03/10/2023  PCP: Douglas Felts, FNP  Admit date: 03/10/2023 Discharge date: 03/14/2023  Admitted From: Home Disposition: SNF  Recommendations for Outpatient Follow-up:  Follow up with PCP in 1-2 weeks Follow-up with orthopedics as scheduled  Discharge Condition: Stable CODE STATUS: Full Diet recommendation: Low-salt low-fat diet  Brief/Interim Summary: Douglas Pruitt is Douglas Pruitt 72 y.o. male Douglas Pruitt speaking male with past medical history of essential hypertension hyperlipidemia CVA with left residual hemiparesis carotid artery occlusion presents after mechanical fall with left hip pain imaging confirmed left intertrochanteric fracture.   Patient admitted as above with closed left intertrochanteric hip fracture, status post ORIF with IM nail on 03/11/2023.  Patient evaluated by physical therapy recommending SNF placement which has already been approved.  At this time is otherwise stable and agreeable for discharge.  Of note patient's leukocytosis appears to be reactive and downtrending appropriately, if signs or symptoms of infection would certainly discuss case with orthopedics or return to the hospital for further evaluation and treatment.  During hospitalization patient was noted to have iron deficiency anemia status post IV iron, will discharge on p.o. iron, recommend rechecking labs in the next few weeks.  Vitamin D also noted to be low, discharged on multivitamin.  Discharge Diagnoses:  Principal Problem:   Closed intertrochanteric fracture of hip, left, initial encounter [facility]) Active Problems:   Fall at home, initial encounter   Leukocytosis   Normocytic anemia   Thrombocytopenia (HCC)   Essential hypertension   History of CVA with residual deficit   Mixed hyperlipidemia   BPH (benign prostatic hyperplasia)  Closed intertrochanteric fracture of hip, left, initial encounter [facility]) Ambulatory dysfunction, frequent  falls Orthopedics following - IM nail successfully placed 03/11/2023 without complication Continue narcotics per orthopedic surgery. Anticoagulation per orthopedics - currently on asa 81, lovenox dvt prevention  PT OT following - planned dispo to SNF   Leukocytosis likely reactive: No source of infection has remained afebrile continue monitor. Leukocytosis downtrending appropriately with supportive care only   Normocytic anemia/iron deficiency anemia/thrombocytopenia: Previous platelet count 109, iron panel consistent with iron deficiency anemia. S/P IV iron --> continue oral iron as Douglas Pruitt outpatient. HIV negative. No signs of overt bleeding.   Essential hypertension: At goal - minimal elevation secondary to prior pain.   History of CVA with residual deficits: Continue aspirin and statin   Hyperlipidemia Continue statins   BPH Continue Flomax.  Discharge Instructions   Allergies as of 03/13/2023   No Known Allergies      Medication List     TAKE these medications    aspirin EC 81 MG tablet Take 1 tablet (81 mg total) by mouth daily. Swallow whole. What changed: additional instructions   atorvastatin 40 MG tablet Commonly known as: LIPITOR TAKE 1 TABLET(40 MG) BY MOUTH AT BEDTIME What changed: See the new instructions.   baclofen 10 MG tablet Commonly known as: LIORESAL TAKE 1 TABLET(10 MG) BY MOUTH TWICE DAILY What changed: See the new instructions.   chlorhexidine 4 % external liquid Commonly known as: HIBICLENS Apply 15 mLs (1 Application total) topically as directed for 30 doses. Use as directed daily for 5 days every other week for 6 weeks.   docusate sodium 100 MG capsule Commonly known as: Colace Take 1 capsule (100 mg total) by mouth daily as needed. What changed: reasons to take this   ferrous sulfate 325 (65 FE) MG EC tablet Take 1 tablet (325 mg total) by mouth 2 (

## 2023-03-13 NOTE — [place] FL2 (Signed)
[place] MEDICAID FL2 LEVEL OF CARE FORM     IDENTIFICATION  Patient Name: Douglas Pruitt Birthdate: 1951/04/10 Sex: male Admission Date (Current Location): 03/10/2023  [place] and [place] Number:  Haynes Bast [id number] P Facility and Address:  [facility], [address] [place], [place] [address]      Provider Number: [id number]  Attending Physician Name and Address:  Azucena Fallen, MD  Relative Name and Phone Number:  [name],[name] Daughter [phone]    Current Level of Care: Hospital Recommended Level of Care: Skilled Nursing Facility Prior Approval Number:    Date Approved/Denied:   PASRR Number: [id number]  Discharge Plan: SNF    Current Diagnoses: Patient Active Problem List   Diagnosis Date Noted   Closed intertrochanteric fracture of hip, left, initial encounter (HCC) 03/10/2023   Normocytic anemia 03/10/2023   Thrombocytopenia (HCC) 03/10/2023   History of CVA with residual deficit 03/10/2023   BPH (benign prostatic hyperplasia) 03/10/2023   Hemiparesis affecting left side as late effect of cerebrovascular accident (HCC) 01/13/2023   Fall at home, initial encounter 01/13/2023   Encounter for immunization 01/13/2023   Right middle cerebral artery stroke (HCC) 11/01/2021   Hyponatremia 10/31/2021   Hypokalemia 10/31/2021   Hyperbilirubinemia 10/31/2021   Leukocytosis 10/31/2021   Stroke-like symptoms 10/30/2021   DNR (do not resuscitate) 10/30/2021   Carotid stenosis 10/23/2021   Stroke (cerebrum) (HCC) 10/22/2021   Memory changes 08/23/2019   Fatigue 08/23/2019   Abnormal glucose 03/11/2019   Essential hypertension 09/10/2018   Mixed hyperlipidemia 09/10/2018   Vision loss, left eye 09/10/2018   Aphakia of left eye 05/30/2011   Posttraumatic aniridia 05/30/2011    Orientation RESPIRATION BLADDER Height & Weight     Self, Time, Situation, Place  Normal Continent Weight: 140 lb (63.5 kg) Height:  5\' 5"  (165.1 cm)   BEHAVIORAL SYMPTOMS/MOOD NEUROLOGICAL BOWEL NUTRITION STATUS      Continent Diet (see discharge summary)  AMBULATORY STATUS COMMUNICATION OF NEEDS Skin   Total Care Verbally Surgical wounds                       Personal Care Assistance Level of Assistance  Bathing, Feeding, Dressing, Total care Bathing Assistance: Maximum assistance Feeding assistance: Limited assistance Dressing Assistance: Maximum assistance Total Care Assistance: Maximum assistance   Functional Limitations Info  Sight, Hearing, Speech Sight Info: Adequate Hearing Info: Impaired Speech Info: Adequate    SPECIAL CARE FACTORS FREQUENCY  PT (By licensed PT), OT (By licensed OT)     PT Frequency: 5x week OT Frequency: 5x week            Contractures Contractures Info: Not present    Additional Factors Info  Code Status, Allergies Code Status Info: full Allergies Info: NKA           Current Medications (03/13/2023):  This is the current hospital active medication list Current Facility-Administered Medications  Medication Dose Route Frequency Provider Last Rate Last Admin   acetaminophen (TYLENOL) tablet 650 mg  650 mg Oral Q8H Montez Morita, PA-C   650 mg at 03/12/23 2015   ascorbic acid (VITAMIN C) tablet 1,000 mg  1,000 mg Oral Daily Montez Morita, PA-C   1,000 mg at 03/13/23 8413   aspirin EC tablet 81 mg  81 mg Oral Daily Montez Morita, PA-C   81 mg at 03/13/23 0859   atorvastatin (LIPITOR) tablet 40 mg  40 mg Oral Daily Montez Morita, PA-C   40 mg at  03/13/23 0901   baclofen (LIORESAL) tablet 10 mg  10 mg Oral BID Montez Morita, PA-C   10 mg at 03/13/23 0900   cholecalciferol (VITAMIN D3) 25 MCG (1000 UNIT) tablet 2,000 Units  2,000 Units Oral BID Montez Morita, PA-C   2,000 Units at 03/13/23 2725   docusate sodium (COLACE) capsule 100 mg  100 mg Oral BID Montez Morita, PA-C   100 mg at 03/13/23 0901   enoxaparin (LOVENOX) injection 40 mg  40 mg Subcutaneous Q24H Montez Morita, PA-C   40 mg at 03/13/23  0858   feeding supplement (ENSURE ENLIVE / ENSURE PLUS) liquid 237 mL  237 mL Oral BID BM Montez Morita, PA-C   237 mL at 03/13/23 0901   gabapentin (NEURONTIN) capsule 300 mg  300 mg Oral QHS Montez Morita, PA-C   300 mg at 03/12/23 2015   hydrALAZINE (APRESOLINE) injection 10 mg  10 mg Intravenous Q4H PRN Montez Morita, PA-C       lisinopril (ZESTRIL) tablet 2.5 mg  2.5 mg Oral Daily Montez Morita, PA-C   2.5 mg at 03/13/23 0901   menthol-cetylpyridinium (CEPACOL) lozenge 3 mg  1 lozenge Oral PRN Montez Morita, PA-C       Or   phenol (CHLORASEPTIC) mouth spray 1 spray  1 spray Mouth/Throat PRN Montez Morita, PA-C       metoCLOPramide (REGLAN) tablet 5-10 mg  5-10 mg Oral Q8H PRN Montez Morita, PA-C       Or   metoCLOPramide (REGLAN) injection 5-10 mg  5-10 mg Intravenous Q8H PRN Montez Morita, PA-C       morphine (PF) 2 MG/ML injection 0.5 mg  0.5 mg Intravenous Q2H PRN Montez Morita, PA-C       multivitamin with minerals tablet 1 tablet  1 tablet Oral Daily Montez Morita, PA-C   1 tablet at 03/13/23 0859   mupirocin ointment (BACTROBAN) 2 % 1 Application  1 Application Nasal BID Montez Morita, PA-C   1 Application at 03/13/23 0858   ondansetron (ZOFRAN) tablet 4 mg  4 mg Oral Q6H PRN Montez Morita, PA-C       Or   ondansetron [facility]) injection 4 mg  4 mg Intravenous Q6H PRN Montez Morita, PA-C       oxyCODONE (Oxy IR/ROXICODONE) immediate release tablet 5-10 mg  5-10 mg Oral Q4H PRN Montez Morita, PA-C   10 mg at 03/13/23 3664   senna-docusate (Senokot-S) tablet 1 tablet  1 tablet Oral QHS Montez Morita, PA-C   1 tablet at 03/12/23 2014   tamsulosin (FLOMAX) capsule 0.4 mg  0.4 mg Oral QPC supper Montez Morita, PA-C   0.4 mg at 03/12/23 1902     Discharge Medications: Please see discharge summary for a list of discharge medications.  Relevant Imaging Results:  Relevant Lab Results:   Additional Information SS# [id number]  Lorri Frederick, LCSW

## 2023-03-13 NOTE — Progress Notes (Signed)
Moderate drop from admission   Slightly tachy this pm and DBP 50'S  Check h/h prior to dc   - Medical issues              Per medicine    - DVT/PE prophylaxis:             Lovenox              Scds                Recommend lovenox x 28 days post op    - ID:              Periop abx    - Metabolic Bone Disease:             Fragility fracture suggestive of osteoporosis              Vitamin d insufficiency                          Supplement              DEXA as outpt    - Activity:             As above   - FEN/GI prophylaxis/Foley/Lines:             ST eval due to stroke history    - Impediments to fracture healing:             Vitamin d insufficiency              History of stroke             Poor bone quality    - Dispo:             Ortho issues stable             Possible snf later today if H/H stable    Follow up with ortho in 2 weeks   Mearl Latin, PA-C [phone] (C) 03/13/2023, 4:25 PM  [facility] [address] [place] [address] [phone] Val [phone] (F)    After 5pm and on the weekends please log on to Amion, go to orthopaedics and the look under the [facility] Call for the provider(s) on call. You can also call our office at [phone] and then follow the prompts to be connected to the call team.  Patient  ID: Douglas Pruitt, male   DOB: 09/12/1950, 72 y.o.   MRN: [id number]  Moderate drop from admission   Slightly tachy this pm and DBP 50'S  Check h/h prior to dc   - Medical issues              Per medicine    - DVT/PE prophylaxis:             Lovenox              Scds                Recommend lovenox x 28 days post op    - ID:              Periop abx    - Metabolic Bone Disease:             Fragility fracture suggestive of osteoporosis              Vitamin d insufficiency                          Supplement              DEXA as outpt    - Activity:             As above   - FEN/GI prophylaxis/Foley/Lines:             ST eval due to stroke history    - Impediments to fracture healing:             Vitamin d insufficiency              History of stroke             Poor bone quality    - Dispo:             Ortho issues stable             Possible snf later today if H/H stable    Follow up with ortho in 2 weeks   Mearl Latin, PA-C [phone] (C) 03/13/2023, 4:25 PM  [facility] [address] [place] [address] [phone] Val [phone] (F)    After 5pm and on the weekends please log on to Amion, go to orthopaedics and the look under the [facility] Call for the provider(s) on call. You can also call our office at [phone] and then follow the prompts to be connected to the call team.  Patient  ID: Douglas Pruitt, male   DOB: 09/12/1950, 72 y.o.   MRN: [id number]  [facility] Progress Note  Patient ID: Douglas Pruitt MRN: [id number] DOB/AGE: 1950-11-30 72 y.o.  Subjective:  Ortho issues stable SNF today possibly    ROS As above  Objective:   VITALS:   Vitals:   03/13/23 0441 03/13/23 0745 03/13/23 1500 03/13/23 1514  BP: 122/77 (!) 128/93  (!) 133/50  Pulse: 85 89  (!) 102  Resp: 16 17 17 18   Temp: 98.5 F (36.9 C) 98 F (36.7 C)  99.2 F (37.3 C)  TempSrc:      SpO2: 97% 100%  100%  Weight:      Height:        Estimated body mass index is 23.3 kg/m as calculated from the following:   Height as of this encounter: 5\' 5"  (1.651 m).   Weight as of this encounter: 63.5 kg.   Intake/Output      08/14 0701 08/15 0700 08/15 0701 08/16 0700   P.O. 120 240   I.V. (mL/kg)     IV Piggyback     Total Intake(mL/kg) 120 (1.9) 240 (3.8)   Urine (mL/kg/hr) 525 (0.3)    Stool     Blood     Total Output 525    Net -405 +240        Urine Occurrence 1 x      LABS  Results for orders placed or performed during the hospital encounter of 03/10/23 (from the past 24 hour(s))  CBC     Status: Abnormal   Collection Time: 03/13/23  2:06 AM  Result Value Ref Range   WBC 10.9 (H) 4.0 - 10.5 K/uL   RBC 2.64 (L) 4.22 - 5.81 MIL/uL   Hemoglobin 7.4 (L) 13.0 - 17.0 g/dL   HCT 34.7 (L) 42.5 - 95.6 %   MCV 84.5 80.0 - 100.0 fL   MCH 28.0 26.0 - 34.0 pg   MCHC 33.2 30.0 - 36.0 g/dL   RDW 38.7 56.4 - 33.2 %   Platelets 84 (L) 150 - 400 K/uL   nRBC 0.0 0.0 - 0.2 %  Basic metabolic panel     Status: Abnormal   Collection Time: 03/13/23  2:06 AM  Result Value Ref Range   Sodium 135 135 - 145 mmol/L   Potassium 3.8 3.5 - 5.1 mmol/L   Chloride 97 (L) 98 - 111 mmol/L   CO2 30 22 - 32 mmol/L   Glucose, Bld 107 (H) 70 - 99 mg/dL   BUN 19 8 - 23 mg/dL   Creatinine, Ser 9.51 0.61 - 1.24 mg/dL   Calcium 8.1 (L) 8.9 - 10.3 mg/dL   GFR, Estimated >88 >41 mL/min    Anion gap 8 5 - 15  Prealbumin     Status: Abnormal   Collection Time: 03/13/23  2:06 AM  Result Value Ref Range   Prealbumin 12 (L) 18 - 38 mg/dL     PHYSICAL EXAM:   Gen: sitting up in bed, NAD, comfortable appearing  Lungs: unlabored Cardiac: slightly tachy Ext:       Left Lower Extremity              Dressing left hip intact, scant strikethrough, no increased drainage              Ext warm

## 2023-03-13 NOTE — Progress Notes (Signed)
[facility] Progress Note  Repeat h/h: CBC    Latest Ref Rng & Units 03/13/2023    4:47 PM 03/13/2023    2:06 AM 03/11/2023   11:51 PM  CBC  WBC 4.0 - 10.5 K/uL  10.9  12.6   Hemoglobin 13.0 - 17.0 g/dL 7.1  7.4  9.3   Hematocrit 39.0 - 52.0 % 20.8  22.3  27.5   Platelets 150 - 400 K/uL  84  92    Pt is post op day 2 Given continue downward trend will transfuse with 1 unit PRBCs (hgb <8 post ortho surgery)   Mearl Latin, PA-C [phone] (C) 03/13/2023, 6:39 PM  [facility] [address] [place] [address] [phone] Collier Bullock (F)       Patient ID: Douglas Pruitt, male   DOB: 09-08-1950, 72 y.o.   MRN: [id number]

## 2023-03-14 DIAGNOSIS — I10 Essential (primary) hypertension: Secondary | ICD-10-CM | POA: Diagnosis not present

## 2023-03-14 DIAGNOSIS — I69354 Hemiplegia and hemiparesis following cerebral infarction affecting left non-dominant side: Secondary | ICD-10-CM | POA: Diagnosis not present

## 2023-03-14 DIAGNOSIS — K5901 Slow transit constipation: Secondary | ICD-10-CM | POA: Diagnosis not present

## 2023-03-14 DIAGNOSIS — Z7401 Bed confinement status: Secondary | ICD-10-CM | POA: Diagnosis not present

## 2023-03-14 DIAGNOSIS — J952 Acute pulmonary insufficiency following nonthoracic surgery: Secondary | ICD-10-CM | POA: Diagnosis not present

## 2023-03-14 DIAGNOSIS — S72145D Nondisplaced intertrochanteric fracture of left femur, subsequent encounter for closed fracture with routine healing: Secondary | ICD-10-CM | POA: Diagnosis not present

## 2023-03-14 DIAGNOSIS — R112 Nausea with vomiting, unspecified: Secondary | ICD-10-CM | POA: Diagnosis not present

## 2023-03-14 DIAGNOSIS — T8484XD Pain due to internal orthopedic prosthetic devices, implants and grafts, subsequent encounter: Secondary | ICD-10-CM | POA: Diagnosis not present

## 2023-03-14 DIAGNOSIS — D849 Immunodeficiency, unspecified: Secondary | ICD-10-CM | POA: Diagnosis not present

## 2023-03-14 DIAGNOSIS — B370 Candidal stomatitis: Secondary | ICD-10-CM | POA: Diagnosis not present

## 2023-03-14 DIAGNOSIS — R351 Nocturia: Secondary | ICD-10-CM | POA: Diagnosis not present

## 2023-03-14 DIAGNOSIS — R41841 Cognitive communication deficit: Secondary | ICD-10-CM | POA: Diagnosis not present

## 2023-03-14 DIAGNOSIS — R2681 Unsteadiness on feet: Secondary | ICD-10-CM | POA: Diagnosis not present

## 2023-03-14 DIAGNOSIS — D509 Iron deficiency anemia, unspecified: Secondary | ICD-10-CM | POA: Diagnosis not present

## 2023-03-14 DIAGNOSIS — D72829 Elevated white blood cell count, unspecified: Secondary | ICD-10-CM | POA: Diagnosis not present

## 2023-03-14 DIAGNOSIS — Z7901 Long term (current) use of anticoagulants: Secondary | ICD-10-CM | POA: Diagnosis not present

## 2023-03-14 DIAGNOSIS — Z8639 Personal history of other endocrine, nutritional and metabolic disease: Secondary | ICD-10-CM | POA: Diagnosis not present

## 2023-03-14 DIAGNOSIS — R051 Acute cough: Secondary | ICD-10-CM | POA: Diagnosis not present

## 2023-03-14 DIAGNOSIS — Z8709 Personal history of other diseases of the respiratory system: Secondary | ICD-10-CM | POA: Diagnosis not present

## 2023-03-14 DIAGNOSIS — M6281 Muscle weakness (generalized): Secondary | ICD-10-CM | POA: Diagnosis not present

## 2023-03-14 DIAGNOSIS — S72142D Displaced intertrochanteric fracture of left femur, subsequent encounter for closed fracture with routine healing: Secondary | ICD-10-CM | POA: Diagnosis not present

## 2023-03-14 DIAGNOSIS — N401 Enlarged prostate with lower urinary tract symptoms: Secondary | ICD-10-CM | POA: Diagnosis not present

## 2023-03-14 DIAGNOSIS — R2689 Other abnormalities of gait and mobility: Secondary | ICD-10-CM | POA: Diagnosis not present

## 2023-03-14 DIAGNOSIS — I69322 Dysarthria following cerebral infarction: Secondary | ICD-10-CM | POA: Diagnosis not present

## 2023-03-14 DIAGNOSIS — R630 Anorexia: Secondary | ICD-10-CM | POA: Diagnosis not present

## 2023-03-14 DIAGNOSIS — S79929A Unspecified injury of unspecified thigh, initial encounter: Secondary | ICD-10-CM | POA: Diagnosis not present

## 2023-03-14 DIAGNOSIS — S72142A Displaced intertrochanteric fracture of left femur, initial encounter for closed fracture: Secondary | ICD-10-CM | POA: Diagnosis not present

## 2023-03-14 DIAGNOSIS — G8194 Hemiplegia, unspecified affecting left nondominant side: Secondary | ICD-10-CM | POA: Diagnosis not present

## 2023-03-14 DIAGNOSIS — R109 Unspecified abdominal pain: Secondary | ICD-10-CM | POA: Diagnosis not present

## 2023-03-14 DIAGNOSIS — M6259 Muscle wasting and atrophy, not elsewhere classified, multiple sites: Secondary | ICD-10-CM | POA: Diagnosis not present

## 2023-03-14 DIAGNOSIS — E785 Hyperlipidemia, unspecified: Secondary | ICD-10-CM | POA: Diagnosis not present

## 2023-03-14 DIAGNOSIS — Z789 Other specified health status: Secondary | ICD-10-CM | POA: Diagnosis not present

## 2023-03-14 DIAGNOSIS — M792 Neuralgia and neuritis, unspecified: Secondary | ICD-10-CM | POA: Diagnosis not present

## 2023-03-14 DIAGNOSIS — E611 Iron deficiency: Secondary | ICD-10-CM | POA: Diagnosis not present

## 2023-03-14 DIAGNOSIS — S7292XD Unspecified fracture of left femur, subsequent encounter for closed fracture with routine healing: Secondary | ICD-10-CM | POA: Diagnosis not present

## 2023-03-14 DIAGNOSIS — R296 Repeated falls: Secondary | ICD-10-CM | POA: Diagnosis not present

## 2023-03-14 DIAGNOSIS — Z8673 Personal history of transient ischemic attack (TIA), and cerebral infarction without residual deficits: Secondary | ICD-10-CM | POA: Diagnosis not present

## 2023-03-14 DIAGNOSIS — H53469 Homonymous bilateral field defects, unspecified side: Secondary | ICD-10-CM | POA: Diagnosis not present

## 2023-03-14 DIAGNOSIS — Z7982 Long term (current) use of aspirin: Secondary | ICD-10-CM | POA: Diagnosis not present

## 2023-03-14 DIAGNOSIS — D696 Thrombocytopenia, unspecified: Secondary | ICD-10-CM | POA: Diagnosis not present

## 2023-03-14 DIAGNOSIS — R1312 Dysphagia, oropharyngeal phase: Secondary | ICD-10-CM | POA: Diagnosis not present

## 2023-03-14 DIAGNOSIS — Z9181 History of falling: Secondary | ICD-10-CM | POA: Diagnosis not present

## 2023-03-14 DIAGNOSIS — K5900 Constipation, unspecified: Secondary | ICD-10-CM | POA: Diagnosis not present

## 2023-03-14 DIAGNOSIS — D649 Anemia, unspecified: Secondary | ICD-10-CM | POA: Diagnosis not present

## 2023-03-14 DIAGNOSIS — Z7189 Other specified counseling: Secondary | ICD-10-CM | POA: Diagnosis not present

## 2023-03-14 DIAGNOSIS — Z6821 Body mass index (BMI) 21.0-21.9, adult: Secondary | ICD-10-CM | POA: Diagnosis not present

## 2023-03-14 DIAGNOSIS — R262 Difficulty in walking, not elsewhere classified: Secondary | ICD-10-CM | POA: Diagnosis not present

## 2023-03-14 LAB — CBC
HCT: 23.5 % — ABNORMAL LOW (ref 39.0–52.0)
Hemoglobin: 8 g/dL — ABNORMAL LOW (ref 13.0–17.0)
MCH: 28.6 pg (ref 26.0–34.0)
MCHC: 34 g/dL (ref 30.0–36.0)
MCV: 83.9 fL (ref 80.0–100.0)
Platelets: 87 10*3/uL — ABNORMAL LOW (ref 150–400)
RBC: 2.8 MIL/uL — ABNORMAL LOW (ref 4.22–5.81)
RDW: 14.2 % (ref 11.5–15.5)
WBC: 11.9 10*3/uL — ABNORMAL HIGH (ref 4.0–10.5)
nRBC: 0 % (ref 0.0–0.2)

## 2023-03-14 MED ORDER — SORBITOL 70 % SOLN: 960.0000 mL | TOPICAL_OIL | Freq: Once | ORAL | Status: DC | PRN

## 2023-03-14 MED ORDER — MAGNESIUM OXIDE -MG SUPPLEMENT 400 (240 MG) MG PO TABS
400.0000 mg | ORAL_TABLET | Freq: Every day | ORAL | Status: DC
  Administered 2023-03-14: 400 mg via ORAL
  Filled 2023-03-14: qty 1

## 2023-03-14 MED ORDER — POTASSIUM CHLORIDE CRYS ER 20 MEQ PO TBCR
40.0000 meq | EXTENDED_RELEASE_TABLET | Freq: Once | ORAL | Status: AC
  Administered 2023-03-14: 40 meq via ORAL
  Filled 2023-03-14: qty 2

## 2023-03-14 MED ORDER — OXYCODONE HCL 5 MG PO TABS: 5.0000 mg | ORAL_TABLET | Freq: Three times a day (TID) | ORAL | 0 refills | Status: AC | PRN

## 2023-03-14 NOTE — Care Plan (Signed)
Patient discharged to [facility]. Patient alert and in stable condition upon discharge. Discharge paperwork and hard copies of prescriptions placed in discharge packet. Patient transported by [facility]. PIV to [facility] removed. All personal belongings taken with patient.

## 2023-03-14 NOTE — TOC Transition Note (Signed)
Transition of Care [facility]) - CM/SW Discharge Note   Patient Details  Name: Douglas Pruitt MRN: [id number] Date of Birth: 1951/04/04  [facility]) CM/SW Contact:  Lorri Frederick, LCSW Phone Number: 03/14/2023, 1:40 PM   Clinical Narrative:   Pt disharging to [place], room 404B.  RN call report to [phone].   1320: CSW informed pt ready for DC.  CSW confirmed with [facility] place that they can receive pt today.    Final next level of care: Skilled Nursing Facility Barriers to Discharge: Barriers Resolved   Patient Goals and CMS Choice   Choice offered to / list presented to : Adult Children (daughter [name])  Discharge Placement                Patient chooses bed at: [facility] Patient to be transferred to facility by: [facility] Name of family member notified: left message with daughter [name] Patient and family notified of of transfer: 03/14/23  Discharge Plan and Services Additional resources added to the After Visit Summary for   In-house Referral: Clinical Social Work   Post Acute Care Choice: Skilled Nursing Facility                               Social Determinants of Health (SDOH) Interventions SDOH Screenings   Food Insecurity: No Food Insecurity (03/10/2023)  Housing: Low Risk  (03/10/2023)  Transportation Needs: No Transportation Needs (03/10/2023)  Utilities: Not At Risk (03/10/2023)  Depression (PHQ2-9): Low Risk  (11/27/2022)  Recent Concern: Depression (PHQ2-9) - High Risk (09/24/2022)  Financial Resource Strain: Low Risk  (11/27/2022)  Physical Activity: Inactive (11/27/2022)  Social Connections: Unknown (10/29/2021)  Stress: Stress Concern Present (11/27/2022)  Tobacco Use: Low Risk  (03/11/2023)     Readmission Risk Interventions     No data to display

## 2023-03-14 NOTE — Progress Notes (Signed)
Orthopaedic Trauma Service Progress Note  Patient ID: Douglas Pruitt MRN: [id number] DOB/AGE: Feb 11, 1951 72 y.o.  Subjective:  Tolerated transfusion well Eating breakfast this am  No new issues   Ready for SNF   ROS As above  Objective:   VITALS:   Vitals:   03/14/23 0400 03/14/23 0500 03/14/23 0600 03/14/23 0729  BP:  107/85  (!) 121/35  Pulse:    70  Resp: 15 17 18    Temp:    98.4 F (36.9 C)  TempSrc:    Oral  SpO2:    99%  Weight:      Height:        Estimated body mass index is 23.3 kg/m as calculated from the following:   Height as of this encounter: 5\' 5"  (1.651 m).   Weight as of this encounter: 63.5 kg.   Intake/Output      08/15 0701 08/16 0700 08/16 0701 08/17 0700   P.O. 240    Blood 1485    Total Intake(mL/kg) 1725 (27.2)    Urine (mL/kg/hr) 450 (0.3)    Total Output 450    Net +1275           LABS  Results for orders placed or performed during the hospital encounter of 03/10/23 (from the past 24 hour(s))  Hemoglobin and hematocrit, blood     Status: Abnormal   Collection Time: 03/13/23  4:47 PM  Result Value Ref Range   Hemoglobin 7.1 (L) 13.0 - 17.0 g/dL   HCT 95.6 (L) 21.3 - 08.6 %  Prepare RBC (crossmatch)     Status: None   Collection Time: 03/13/23  7:00 PM  Result Value Ref Range   Order Confirmation      ORDER PROCESSED BY BLOOD BANK Performed at [facility], [address] [place], [place] [address]   CBC     Status: Abnormal   Collection Time: 03/14/23  3:48 AM  Result Value Ref Range   WBC 11.9 (H) 4.0 - 10.5 K/uL   RBC 2.80 (L) 4.22 - 5.81 MIL/uL   Hemoglobin 8.0 (L) 13.0 - 17.0 g/dL   HCT 96.2 (L) 95.2 - 84.1 %   MCV 83.9 80.0 - 100.0 fL   MCH 28.6 26.0 - 34.0 pg   MCHC 34.0 30.0 - 36.0 g/dL   RDW 32.4 40.1 - 02.7 %   Platelets 87 (L) 150 - 400 K/uL   nRBC 0.0 0.0 - 0.2 %     PHYSICAL EXAM:   Gen: sitting up in bed, NAD, comfortable  appearing, eating breakfast, daughter at bedside  Lungs: unlabored Cardiac: reg Ext:       Left Lower Extremity              Dressing left hip intact, scant strikethrough, no increased drainage    Dressings removed   Surgical sites look great    No erythema              Ext warm              Moderate muscle atrophy present              Ext warm              + DP pulse  Motor and sensory functions at baseline             No DCT             Lower leg compartments are soft             Mild thigh swelling but soft             Can get ankle past neutral passively     Assessment/Plan: 3 Days Post-Op   Principal Problem:   Closed intertrochanteric fracture of hip, left, initial encounter [facility]) Active Problems:   Essential hypertension   Mixed hyperlipidemia   Leukocytosis   Fall at home, initial encounter   Normocytic anemia   Thrombocytopenia (HCC)   History of CVA with residual deficit   BPH (benign prostatic hyperplasia)   Anti-infectives (From admission, onward)    Start     Dose/Rate Route Frequency Ordered Stop   03/11/23 1600  ceFAZolin (ANCEF) IVPB 2g/100 mL premix        2 g 200 mL/hr over 30 Minutes Intravenous Every 6 hours 03/11/23 1315 03/11/23 2119   03/11/23 0958  vancomycin (VANCOCIN) 1-5 GM/200ML-% IVPB       Note to Pharmacy: Annabelle Harman A: cabinet override      03/11/23 0958 03/11/23 2214   03/11/23 0815  ceFAZolin (ANCEF) IVPB 2g/100 mL premix        2 g 200 mL/hr over 30 Minutes Intravenous On call to O.R. 03/11/23 1610 03/11/23 1045     .  POD/HD#: 29  72 y/o male with history of CVA with L sided deficits s/p fall with L hip fracture    - fall    -fragility fracture left hip s/p IMN  Weightbearing WBAT L leg with assistance Will likely need platform on L side to facilitate walker use. I have discussed with PT                ROM/Activity                         No motion restrictions to L hip, knee or ankle                           Order place for orthotist to start measuring for AFO as he will need this long term for his chronic foot drop to prevent/minimize chances of falls               Wound care                         Daily wound care as needed Leave open to air once there is no drainage Ok to clean with soap and water once there is no drainage                 Ice PRN              Therapies    - Pain management:             Minimize narcotics              - ABL anemia/Hemodynamics             Expected improvement after 1 unit PRBC yesterday     - Medical issues              Per medicine    - DVT/PE  prophylaxis:             Lovenox              Scds                Recommend lovenox x 28 days post op    - ID:              Periop abx    - Metabolic Bone Disease:             Fragility fracture suggestive of osteoporosis              Vitamin d insufficiency                          Supplement              DEXA as outpt    - Activity:             As above    - Impediments to fracture healing:             Vitamin d insufficiency              History of stroke             Poor bone quality    - Dispo:            Ok to [employer] from ortho standpoint  Follow up with ortho in 2 weeks   Mearl Latin, PA-C [phone] (C) 03/14/2023, 10:10 AM  [facility] [address] [place] [address] [phone] Val [phone] (F)    After 5pm and on the weekends please log on to Amion, go to orthopaedics and the look under the [facility] Call for the provider(s) on call. You can also call our office at [phone] and then follow the prompts to be connected to the call team.  Patient ID: Douglas Pruitt, male   DOB: 05-Mar-1951, 72 y.o.   MRN: [id number]

## 2023-03-14 NOTE — Progress Notes (Addendum)
Blood infusion completed at 2317, tolerated infusion well, no acute distress noted.

## 2023-03-14 NOTE — Plan of Care (Signed)

## 2023-03-14 NOTE — Progress Notes (Signed)
Occupational Therapy Treatment Patient Details Name: Douglas Pruitt MRN: [id number] DOB: 21-Aug-1950 Today's Date: 03/14/2023   History of present illness Pt is a 72 year old man admitted on 8/12 after a fall in which he sustained a L intertrochanteric hip fx. Underwent IM nail 8/14. PMH: CVA with L residual hemiparesis, BPH, HTN, HLD.   OT comments  Min assist for hips to EOB. Squat pivot transferred to [facility] for BM. Pt requiring total assist seated edge of BSC for posterior pericare. Returned to bed with assist for LEs. Pt to discharge to rehab later today, did not remain up in chair.      If plan is discharge home, recommend the following:  A lot of help with bathing/dressing/bathroom;Assistance with cooking/housework;Assistance with feeding;Direct supervision/assist for medications management;Direct supervision/assist for financial management;Assist for transportation;Help with stairs or ramp for entrance;A lot of help with walking and/or transfers   Equipment Recommendations  Other (comment) (defer)    Recommendations for Other Services      Precautions / Restrictions Precautions Precautions: Fall Restrictions Weight Bearing Restrictions: Yes LLE Weight Bearing: Weight bearing as tolerated       Mobility Bed Mobility Overal bed mobility: Needs Assistance Bed Mobility: Supine to Sit, Sit to Supine     Supine to sit: Min assist, Used rails Sit to supine: Mod assist   General bed mobility comments: assist of bed pad for hips to EOB, for LEs back into bed    Transfers Overall transfer level: Needs assistance Equipment used: 1 person hand held assist Transfers: Bed to chair/wheelchair/BSC     Squat pivot transfers: Mod assist       General transfer comment: squat pivot bed<>BSC     Balance Overall balance assessment: Needs assistance Sitting-balance support: Single extremity supported Sitting balance-Leahy Scale: Fair                                      ADL either performed or assessed with clinical judgement   ADL Overall ADL's : Needs assistance/impaired                         Toilet Transfer: Moderate assistance;Squat-pivot;BSC/3in1   Toileting- Clothing Manipulation and Hygiene: Total assistance;Sitting/lateral lean              Extremity/Trunk Assessment              Vision       Perception     Praxis      Cognition Arousal: Alert Behavior During Therapy: WFL for tasks assessed/performed Overall Cognitive Status: History of cognitive impairments - at baseline                                          Exercises      Shoulder Instructions       General Comments      Pertinent Vitals/ Pain       Pain Assessment Pain Assessment: Faces Faces Pain Scale: Hurts little more Pain Location: L hip Pain Descriptors / Indicators: Guarding Pain Intervention(s): Monitored during session, Repositioned  Home Living  Prior Functioning/Environment              Frequency  Min 1X/week        Progress Toward Goals  OT Goals(current goals can now be found in the care plan section)  Progress towards OT goals: Progressing toward goals  Acute Rehab OT Goals OT Goal Formulation: With patient/family Time For Goal Achievement: 03/26/23 Potential to Achieve Goals: Good  Plan      Co-evaluation                 AM-PAC OT "6 Clicks" Daily Activity     Outcome Measure   Help from another person eating meals?: A Little Help from another person taking care of personal grooming?: A Little Help from another person toileting, which includes using toliet, bedpan, or urinal?: Total Help from another person bathing (including washing, rinsing, drying)?: A Lot Help from another person to put on and taking off regular upper body clothing?: A Lot Help from another person to put on and taking off regular lower body  clothing?: Total 6 Click Score: 12    End of Session Equipment Utilized During Treatment: Gait belt  OT Visit Diagnosis: Unsteadiness on feet (R26.81);Pain;Hemiplegia and hemiparesis Hemiplegia - Right/Left: Left Hemiplegia - dominant/non-dominant: Non-Dominant Hemiplegia - caused by: Cerebral infarction   Activity Tolerance Patient tolerated treatment well   Patient Left in bed;with call bell/phone within reach;with bed alarm set   Nurse Communication          Time: 1350-1409 OT Time Calculation (min): 19 min  Charges: OT General Charges $OT Visit: 1 Visit OT Treatments $Self Care/Home Management : 8-22 mins  Berna Spare, OTR/L Acute Rehabilitation Services Office: [phone]   Evern Bio 03/14/2023, 3:42 PM

## 2023-03-14 NOTE — Progress Notes (Signed)
Progress Note   Patient: Douglas Pruitt:[id number] DOB: [birth year] DOA: 03/10/2023     4 DOS: the patient was seen and examined on 03/14/2023   Brief hospital course: 72 year old male with past medical history of hypertension, hyperlipidemia, history of CVA with left-sided residual hemiparesis, carotid artery occlusion, anemia, BPH who is a status post fall 4 days ago undergoing left IM nail placement with Dr. Renold Don who is ready to be discharged to SNF yesterday but had to stay for blood transfusion.  Today his hemoglobin level is up to 8.0 g/dL.  He has no acute complaints.  Assessment and Plan: Principal Problem:   Closed intertrochanteric fracture of hip, left, initial encounter [facility]) Active Problems:   Fall at home, initial encounter   Leukocytosis   Normocytic anemia   Thrombocytopenia (HCC)   Essential hypertension   History of CVA with residual deficit   Mixed hyperlipidemia   BPH (benign prostatic hyperplasia)   Closed intertrochanteric fracture of hip, left, initial encounter [facility]) Orthopedic surgery was consulted she status post intramedullary nailing on 03/11/2023. Continue narcotics per orthopedic surgery.  Anticoagulation per orthopedics. Colace daily. Anticipate skilled nursing facility. PT OT has been consulted. TOC has been notified possible skilled nursing.   Leukocytosis likely reactive: No source of infection has remained afebrile continue monitor. Leukocytosis slowly improving.   Normocytic anemia/iron deficiency anemia/thrombocytopenia: Previous platelet count 109, iron panel consistent with iron deficiency anemia. She received IV iron we will continue orally as [place] outpatient. HIV negative.  No signs of overt bleeding.   Essential hypertension: Blood pressure is  elevation is likely due to pain.  At 72y/o goal pressure less than 160/90. Hydralazine IV as needed.   History of CVA with residual deficits: Continue aspirin and statins for    Hyperlipidemia Continue statins   BPH Continue Flomax.   Subjective: No acute distress.  Still has not had a bowel movement.  Stated pain is under control.  Physical Exam: Vitals:   03/14/23 0400 03/14/23 0500 03/14/23 0600 03/14/23 0729  BP:  107/85  (!) 121/35  Pulse:    70  Resp: 15 17 18    Temp:    98.4 F (36.9 C)  TempSrc:    Oral  SpO2:    99%  Weight:      Height:       Physical Exam Vitals and nursing note reviewed.  Constitutional:      Appearance: Normal appearance.  HENT:     Head: Normocephalic.     Nose: No rhinorrhea.     Mouth/Throat:     Mouth: Mucous membranes are moist.  Eyes:     General: No scleral icterus.    Pupils: Pupils are equal, round, and reactive to light.  Cardiovascular:     Rate and Rhythm: Normal rate and regular rhythm.  Pulmonary:     Effort: Pulmonary effort is normal.     Breath sounds: Normal breath sounds. No wheezing, rhonchi or rales.  Abdominal:     General: Bowel sounds are normal. There is no distension.     Palpations: Abdomen is soft.     Tenderness: There is no abdominal tenderness. There is no right CVA tenderness or left CVA tenderness.  Musculoskeletal:     Cervical back: Neck supple.     Right lower leg: No edema.     Left lower leg: No edema.  Skin:    General: Skin is warm and dry.  Neurological:     Mental Status: He is alert.  Mental status is at baseline.  Psychiatric:        Mood and Affect: Mood normal.    Data Reviewed:  Results are pending, will review when available.  Family Communication:   Disposition: Status is: Inpatient Remains inpatient appropriate because:   Planned Discharge Destination: Skilled nursing facility Time spent: 35 minutes.  Minutes  Author: Bobette Mo, MD 03/14/2023 8:11 AM  For on call review [email or web address].   This document was prepared using Dragon voice recognition software and may contain some unintended transcription errors.

## 2023-03-14 NOTE — Care Plan (Signed)
Patient to be discharged to [facility] today. Report called and given to Villa Herb, RN.

## 2023-03-17 DIAGNOSIS — Z7901 Long term (current) use of anticoagulants: Secondary | ICD-10-CM | POA: Diagnosis not present

## 2023-03-17 DIAGNOSIS — D696 Thrombocytopenia, unspecified: Secondary | ICD-10-CM | POA: Diagnosis not present

## 2023-03-17 DIAGNOSIS — R2681 Unsteadiness on feet: Secondary | ICD-10-CM | POA: Diagnosis not present

## 2023-03-17 DIAGNOSIS — Z8673 Personal history of transient ischemic attack (TIA), and cerebral infarction without residual deficits: Secondary | ICD-10-CM | POA: Diagnosis not present

## 2023-03-17 DIAGNOSIS — N401 Enlarged prostate with lower urinary tract symptoms: Secondary | ICD-10-CM | POA: Diagnosis not present

## 2023-03-17 DIAGNOSIS — D849 Immunodeficiency, unspecified: Secondary | ICD-10-CM | POA: Diagnosis not present

## 2023-03-17 DIAGNOSIS — D649 Anemia, unspecified: Secondary | ICD-10-CM | POA: Diagnosis not present

## 2023-03-17 DIAGNOSIS — E611 Iron deficiency: Secondary | ICD-10-CM | POA: Diagnosis not present

## 2023-03-17 DIAGNOSIS — E785 Hyperlipidemia, unspecified: Secondary | ICD-10-CM | POA: Diagnosis not present

## 2023-03-17 DIAGNOSIS — R296 Repeated falls: Secondary | ICD-10-CM | POA: Diagnosis not present

## 2023-03-17 DIAGNOSIS — M6281 Muscle weakness (generalized): Secondary | ICD-10-CM | POA: Diagnosis not present

## 2023-03-17 DIAGNOSIS — R051 Acute cough: Secondary | ICD-10-CM | POA: Diagnosis not present

## 2023-03-17 DIAGNOSIS — G8194 Hemiplegia, unspecified affecting left nondominant side: Secondary | ICD-10-CM | POA: Diagnosis not present

## 2023-03-17 DIAGNOSIS — S72145D Nondisplaced intertrochanteric fracture of left femur, subsequent encounter for closed fracture with routine healing: Secondary | ICD-10-CM | POA: Diagnosis not present

## 2023-03-17 DIAGNOSIS — J952 Acute pulmonary insufficiency following nonthoracic surgery: Secondary | ICD-10-CM | POA: Diagnosis not present

## 2023-03-17 DIAGNOSIS — K5900 Constipation, unspecified: Secondary | ICD-10-CM | POA: Diagnosis not present

## 2023-03-17 DIAGNOSIS — S72142D Displaced intertrochanteric fracture of left femur, subsequent encounter for closed fracture with routine healing: Secondary | ICD-10-CM | POA: Diagnosis not present

## 2023-03-17 DIAGNOSIS — R351 Nocturia: Secondary | ICD-10-CM | POA: Diagnosis not present

## 2023-03-17 DIAGNOSIS — D72829 Elevated white blood cell count, unspecified: Secondary | ICD-10-CM | POA: Diagnosis not present

## 2023-03-17 DIAGNOSIS — Z9181 History of falling: Secondary | ICD-10-CM | POA: Diagnosis not present

## 2023-03-18 DIAGNOSIS — R112 Nausea with vomiting, unspecified: Secondary | ICD-10-CM | POA: Diagnosis not present

## 2023-03-18 DIAGNOSIS — S7292XD Unspecified fracture of left femur, subsequent encounter for closed fracture with routine healing: Secondary | ICD-10-CM | POA: Diagnosis not present

## 2023-03-18 DIAGNOSIS — Z6821 Body mass index (BMI) 21.0-21.9, adult: Secondary | ICD-10-CM | POA: Diagnosis not present

## 2023-03-18 DIAGNOSIS — B370 Candidal stomatitis: Secondary | ICD-10-CM | POA: Diagnosis not present

## 2023-03-18 DIAGNOSIS — R630 Anorexia: Secondary | ICD-10-CM | POA: Diagnosis not present

## 2023-03-19 DIAGNOSIS — Z9181 History of falling: Secondary | ICD-10-CM | POA: Diagnosis not present

## 2023-03-19 DIAGNOSIS — K5900 Constipation, unspecified: Secondary | ICD-10-CM | POA: Diagnosis not present

## 2023-03-19 DIAGNOSIS — R2681 Unsteadiness on feet: Secondary | ICD-10-CM | POA: Diagnosis not present

## 2023-03-19 DIAGNOSIS — S72142D Displaced intertrochanteric fracture of left femur, subsequent encounter for closed fracture with routine healing: Secondary | ICD-10-CM | POA: Diagnosis not present

## 2023-03-19 DIAGNOSIS — G8194 Hemiplegia, unspecified affecting left nondominant side: Secondary | ICD-10-CM | POA: Diagnosis not present

## 2023-03-19 DIAGNOSIS — M6281 Muscle weakness (generalized): Secondary | ICD-10-CM | POA: Diagnosis not present

## 2023-03-21 DIAGNOSIS — Z8709 Personal history of other diseases of the respiratory system: Secondary | ICD-10-CM | POA: Diagnosis not present

## 2023-03-21 DIAGNOSIS — Z7189 Other specified counseling: Secondary | ICD-10-CM | POA: Diagnosis not present

## 2023-03-21 DIAGNOSIS — R630 Anorexia: Secondary | ICD-10-CM | POA: Diagnosis not present

## 2023-03-21 DIAGNOSIS — B370 Candidal stomatitis: Secondary | ICD-10-CM | POA: Diagnosis not present

## 2023-03-21 DIAGNOSIS — Z789 Other specified health status: Secondary | ICD-10-CM | POA: Diagnosis not present

## 2023-03-21 DIAGNOSIS — S72145D Nondisplaced intertrochanteric fracture of left femur, subsequent encounter for closed fracture with routine healing: Secondary | ICD-10-CM | POA: Diagnosis not present

## 2023-03-21 DIAGNOSIS — R112 Nausea with vomiting, unspecified: Secondary | ICD-10-CM | POA: Diagnosis not present

## 2023-03-21 DIAGNOSIS — D72829 Elevated white blood cell count, unspecified: Secondary | ICD-10-CM | POA: Diagnosis not present

## 2023-03-24 DIAGNOSIS — R2681 Unsteadiness on feet: Secondary | ICD-10-CM | POA: Diagnosis not present

## 2023-03-24 DIAGNOSIS — K5900 Constipation, unspecified: Secondary | ICD-10-CM | POA: Diagnosis not present

## 2023-03-24 DIAGNOSIS — S72142D Displaced intertrochanteric fracture of left femur, subsequent encounter for closed fracture with routine healing: Secondary | ICD-10-CM | POA: Diagnosis not present

## 2023-03-24 DIAGNOSIS — G8194 Hemiplegia, unspecified affecting left nondominant side: Secondary | ICD-10-CM | POA: Diagnosis not present

## 2023-03-24 DIAGNOSIS — M6281 Muscle weakness (generalized): Secondary | ICD-10-CM | POA: Diagnosis not present

## 2023-03-24 DIAGNOSIS — Z9181 History of falling: Secondary | ICD-10-CM | POA: Diagnosis not present

## 2023-03-26 DIAGNOSIS — R2681 Unsteadiness on feet: Secondary | ICD-10-CM | POA: Diagnosis not present

## 2023-03-26 DIAGNOSIS — M6281 Muscle weakness (generalized): Secondary | ICD-10-CM | POA: Diagnosis not present

## 2023-03-26 DIAGNOSIS — K5900 Constipation, unspecified: Secondary | ICD-10-CM | POA: Diagnosis not present

## 2023-03-26 DIAGNOSIS — S72142D Displaced intertrochanteric fracture of left femur, subsequent encounter for closed fracture with routine healing: Secondary | ICD-10-CM | POA: Diagnosis not present

## 2023-03-26 DIAGNOSIS — G8194 Hemiplegia, unspecified affecting left nondominant side: Secondary | ICD-10-CM | POA: Diagnosis not present

## 2023-03-26 DIAGNOSIS — Z9181 History of falling: Secondary | ICD-10-CM | POA: Diagnosis not present

## 2023-04-02 DIAGNOSIS — Z9181 History of falling: Secondary | ICD-10-CM | POA: Diagnosis not present

## 2023-04-02 DIAGNOSIS — G8194 Hemiplegia, unspecified affecting left nondominant side: Secondary | ICD-10-CM | POA: Diagnosis not present

## 2023-04-02 DIAGNOSIS — M6281 Muscle weakness (generalized): Secondary | ICD-10-CM | POA: Diagnosis not present

## 2023-04-02 DIAGNOSIS — K5900 Constipation, unspecified: Secondary | ICD-10-CM | POA: Diagnosis not present

## 2023-04-02 DIAGNOSIS — R2681 Unsteadiness on feet: Secondary | ICD-10-CM | POA: Diagnosis not present

## 2023-04-02 DIAGNOSIS — S72142D Displaced intertrochanteric fracture of left femur, subsequent encounter for closed fracture with routine healing: Secondary | ICD-10-CM | POA: Diagnosis not present

## 2023-04-03 ENCOUNTER — Other Ambulatory Visit: Payer: Self-pay | Admitting: *Deleted

## 2023-04-03 NOTE — Patient Outreach (Signed)
Mr. Bate resides in [facility] skilled nursing facility. Screening for potential care coordination services as benefit of health plan and Primary Care Provider.   Collaboration with therapy Production designer, theatre/television/film and social work team. Mr. Klass continues work with therapy. Anticipated transition plan is to return home with family.   Will continue to follow.   Raiford Noble, MSN, RN, BSN [place]  [facility], [facility] RN Post- Acute Care Coordinator Direct Dial: [phone]

## 2023-04-04 DIAGNOSIS — R2681 Unsteadiness on feet: Secondary | ICD-10-CM | POA: Diagnosis not present

## 2023-04-04 DIAGNOSIS — K5900 Constipation, unspecified: Secondary | ICD-10-CM | POA: Diagnosis not present

## 2023-04-04 DIAGNOSIS — Z9181 History of falling: Secondary | ICD-10-CM | POA: Diagnosis not present

## 2023-04-04 DIAGNOSIS — M6281 Muscle weakness (generalized): Secondary | ICD-10-CM | POA: Diagnosis not present

## 2023-04-04 DIAGNOSIS — G8194 Hemiplegia, unspecified affecting left nondominant side: Secondary | ICD-10-CM | POA: Diagnosis not present

## 2023-04-04 DIAGNOSIS — S72142D Displaced intertrochanteric fracture of left femur, subsequent encounter for closed fracture with routine healing: Secondary | ICD-10-CM | POA: Diagnosis not present

## 2023-04-11 DIAGNOSIS — R2681 Unsteadiness on feet: Secondary | ICD-10-CM | POA: Diagnosis not present

## 2023-04-11 DIAGNOSIS — Z9181 History of falling: Secondary | ICD-10-CM | POA: Diagnosis not present

## 2023-04-11 DIAGNOSIS — G8194 Hemiplegia, unspecified affecting left nondominant side: Secondary | ICD-10-CM | POA: Diagnosis not present

## 2023-04-11 DIAGNOSIS — K5900 Constipation, unspecified: Secondary | ICD-10-CM | POA: Diagnosis not present

## 2023-04-11 DIAGNOSIS — M6281 Muscle weakness (generalized): Secondary | ICD-10-CM | POA: Diagnosis not present

## 2023-04-11 DIAGNOSIS — S72142D Displaced intertrochanteric fracture of left femur, subsequent encounter for closed fracture with routine healing: Secondary | ICD-10-CM | POA: Diagnosis not present

## 2023-04-14 DIAGNOSIS — S72142D Displaced intertrochanteric fracture of left femur, subsequent encounter for closed fracture with routine healing: Secondary | ICD-10-CM | POA: Diagnosis not present

## 2023-04-14 DIAGNOSIS — Z9181 History of falling: Secondary | ICD-10-CM | POA: Diagnosis not present

## 2023-04-14 DIAGNOSIS — R2681 Unsteadiness on feet: Secondary | ICD-10-CM | POA: Diagnosis not present

## 2023-04-14 DIAGNOSIS — M6281 Muscle weakness (generalized): Secondary | ICD-10-CM | POA: Diagnosis not present

## 2023-04-14 DIAGNOSIS — K5900 Constipation, unspecified: Secondary | ICD-10-CM | POA: Diagnosis not present

## 2023-04-14 DIAGNOSIS — G8194 Hemiplegia, unspecified affecting left nondominant side: Secondary | ICD-10-CM | POA: Diagnosis not present

## 2023-04-16 DIAGNOSIS — S72142D Displaced intertrochanteric fracture of left femur, subsequent encounter for closed fracture with routine healing: Secondary | ICD-10-CM | POA: Diagnosis not present

## 2023-04-16 DIAGNOSIS — G8194 Hemiplegia, unspecified affecting left nondominant side: Secondary | ICD-10-CM | POA: Diagnosis not present

## 2023-04-16 DIAGNOSIS — M6281 Muscle weakness (generalized): Secondary | ICD-10-CM | POA: Diagnosis not present

## 2023-04-16 DIAGNOSIS — Z9181 History of falling: Secondary | ICD-10-CM | POA: Diagnosis not present

## 2023-04-16 DIAGNOSIS — R2681 Unsteadiness on feet: Secondary | ICD-10-CM | POA: Diagnosis not present

## 2023-04-16 DIAGNOSIS — K5900 Constipation, unspecified: Secondary | ICD-10-CM | POA: Diagnosis not present

## 2023-04-21 DIAGNOSIS — Z9181 History of falling: Secondary | ICD-10-CM | POA: Diagnosis not present

## 2023-04-21 DIAGNOSIS — G8194 Hemiplegia, unspecified affecting left nondominant side: Secondary | ICD-10-CM | POA: Diagnosis not present

## 2023-04-21 DIAGNOSIS — K5900 Constipation, unspecified: Secondary | ICD-10-CM | POA: Diagnosis not present

## 2023-04-21 DIAGNOSIS — S72142D Displaced intertrochanteric fracture of left femur, subsequent encounter for closed fracture with routine healing: Secondary | ICD-10-CM | POA: Diagnosis not present

## 2023-04-21 DIAGNOSIS — M6281 Muscle weakness (generalized): Secondary | ICD-10-CM | POA: Diagnosis not present

## 2023-04-21 DIAGNOSIS — R2681 Unsteadiness on feet: Secondary | ICD-10-CM | POA: Diagnosis not present

## 2023-04-22 ENCOUNTER — Other Ambulatory Visit: Payer: Self-pay

## 2023-04-22 DIAGNOSIS — E782 Mixed hyperlipidemia: Secondary | ICD-10-CM

## 2023-04-22 DIAGNOSIS — I10 Essential (primary) hypertension: Secondary | ICD-10-CM

## 2023-04-22 MED ORDER — LISINOPRIL 2.5 MG PO TABS: 2.5000 mg | ORAL_TABLET | Freq: Every day | ORAL | 2 refills | Status: DC

## 2023-04-22 MED ORDER — ATORVASTATIN CALCIUM 40 MG PO TABS: 40.0000 mg | ORAL_TABLET | Freq: Every day | ORAL | 2 refills | Status: DC

## 2023-04-23 DIAGNOSIS — M6281 Muscle weakness (generalized): Secondary | ICD-10-CM | POA: Diagnosis not present

## 2023-04-23 DIAGNOSIS — Z9181 History of falling: Secondary | ICD-10-CM | POA: Diagnosis not present

## 2023-04-23 DIAGNOSIS — G8194 Hemiplegia, unspecified affecting left nondominant side: Secondary | ICD-10-CM | POA: Diagnosis not present

## 2023-04-23 DIAGNOSIS — S72142D Displaced intertrochanteric fracture of left femur, subsequent encounter for closed fracture with routine healing: Secondary | ICD-10-CM | POA: Diagnosis not present

## 2023-04-23 DIAGNOSIS — K5900 Constipation, unspecified: Secondary | ICD-10-CM | POA: Diagnosis not present

## 2023-04-23 DIAGNOSIS — R2681 Unsteadiness on feet: Secondary | ICD-10-CM | POA: Diagnosis not present

## 2023-04-25 DIAGNOSIS — N401 Enlarged prostate with lower urinary tract symptoms: Secondary | ICD-10-CM | POA: Diagnosis not present

## 2023-04-25 DIAGNOSIS — D509 Iron deficiency anemia, unspecified: Secondary | ICD-10-CM | POA: Diagnosis not present

## 2023-04-25 DIAGNOSIS — R296 Repeated falls: Secondary | ICD-10-CM | POA: Diagnosis not present

## 2023-04-25 DIAGNOSIS — K5901 Slow transit constipation: Secondary | ICD-10-CM | POA: Diagnosis not present

## 2023-04-25 DIAGNOSIS — Z7982 Long term (current) use of aspirin: Secondary | ICD-10-CM | POA: Diagnosis not present

## 2023-04-25 DIAGNOSIS — M792 Neuralgia and neuritis, unspecified: Secondary | ICD-10-CM | POA: Diagnosis not present

## 2023-04-25 DIAGNOSIS — S72145D Nondisplaced intertrochanteric fracture of left femur, subsequent encounter for closed fracture with routine healing: Secondary | ICD-10-CM | POA: Diagnosis not present

## 2023-04-25 DIAGNOSIS — H53469 Homonymous bilateral field defects, unspecified side: Secondary | ICD-10-CM | POA: Diagnosis not present

## 2023-04-25 DIAGNOSIS — Z8639 Personal history of other endocrine, nutritional and metabolic disease: Secondary | ICD-10-CM | POA: Diagnosis not present

## 2023-04-25 DIAGNOSIS — I69354 Hemiplegia and hemiparesis following cerebral infarction affecting left non-dominant side: Secondary | ICD-10-CM | POA: Diagnosis not present

## 2023-04-25 DIAGNOSIS — I69322 Dysarthria following cerebral infarction: Secondary | ICD-10-CM | POA: Diagnosis not present

## 2023-04-25 DIAGNOSIS — I10 Essential (primary) hypertension: Secondary | ICD-10-CM | POA: Diagnosis not present

## 2023-04-28 DIAGNOSIS — K5900 Constipation, unspecified: Secondary | ICD-10-CM | POA: Diagnosis not present

## 2023-04-28 DIAGNOSIS — S72142D Displaced intertrochanteric fracture of left femur, subsequent encounter for closed fracture with routine healing: Secondary | ICD-10-CM | POA: Diagnosis not present

## 2023-04-28 DIAGNOSIS — G8194 Hemiplegia, unspecified affecting left nondominant side: Secondary | ICD-10-CM | POA: Diagnosis not present

## 2023-04-28 DIAGNOSIS — M6281 Muscle weakness (generalized): Secondary | ICD-10-CM | POA: Diagnosis not present

## 2023-04-28 DIAGNOSIS — R2681 Unsteadiness on feet: Secondary | ICD-10-CM | POA: Diagnosis not present

## 2023-04-28 DIAGNOSIS — Z9181 History of falling: Secondary | ICD-10-CM | POA: Diagnosis not present

## 2023-04-30 DIAGNOSIS — G8194 Hemiplegia, unspecified affecting left nondominant side: Secondary | ICD-10-CM | POA: Diagnosis not present

## 2023-04-30 DIAGNOSIS — M6281 Muscle weakness (generalized): Secondary | ICD-10-CM | POA: Diagnosis not present

## 2023-04-30 DIAGNOSIS — Z9181 History of falling: Secondary | ICD-10-CM | POA: Diagnosis not present

## 2023-04-30 DIAGNOSIS — R2681 Unsteadiness on feet: Secondary | ICD-10-CM | POA: Diagnosis not present

## 2023-04-30 DIAGNOSIS — S72142D Displaced intertrochanteric fracture of left femur, subsequent encounter for closed fracture with routine healing: Secondary | ICD-10-CM | POA: Diagnosis not present

## 2023-04-30 DIAGNOSIS — K5900 Constipation, unspecified: Secondary | ICD-10-CM | POA: Diagnosis not present

## 2023-05-02 ENCOUNTER — Other Ambulatory Visit: Payer: Self-pay | Admitting: Nurse Practitioner

## 2023-05-02 DIAGNOSIS — Z1211 Encounter for screening for malignant neoplasm of colon: Secondary | ICD-10-CM

## 2023-05-02 DIAGNOSIS — Z1212 Encounter for screening for malignant neoplasm of rectum: Secondary | ICD-10-CM

## 2023-05-05 DIAGNOSIS — K5901 Slow transit constipation: Secondary | ICD-10-CM | POA: Diagnosis not present

## 2023-05-05 DIAGNOSIS — I10 Essential (primary) hypertension: Secondary | ICD-10-CM | POA: Diagnosis not present

## 2023-05-05 DIAGNOSIS — R296 Repeated falls: Secondary | ICD-10-CM | POA: Diagnosis not present

## 2023-05-05 DIAGNOSIS — S72145D Nondisplaced intertrochanteric fracture of left femur, subsequent encounter for closed fracture with routine healing: Secondary | ICD-10-CM | POA: Diagnosis not present

## 2023-05-05 DIAGNOSIS — D509 Iron deficiency anemia, unspecified: Secondary | ICD-10-CM | POA: Diagnosis not present

## 2023-05-05 DIAGNOSIS — I69354 Hemiplegia and hemiparesis following cerebral infarction affecting left non-dominant side: Secondary | ICD-10-CM | POA: Diagnosis not present

## 2023-05-05 DIAGNOSIS — M792 Neuralgia and neuritis, unspecified: Secondary | ICD-10-CM | POA: Diagnosis not present

## 2023-05-07 DIAGNOSIS — S72142D Displaced intertrochanteric fracture of left femur, subsequent encounter for closed fracture with routine healing: Secondary | ICD-10-CM | POA: Diagnosis not present

## 2023-05-13 DIAGNOSIS — Z9181 History of falling: Secondary | ICD-10-CM | POA: Diagnosis not present

## 2023-05-13 DIAGNOSIS — M6281 Muscle weakness (generalized): Secondary | ICD-10-CM | POA: Diagnosis not present

## 2023-05-13 DIAGNOSIS — G8194 Hemiplegia, unspecified affecting left nondominant side: Secondary | ICD-10-CM | POA: Diagnosis not present

## 2023-05-13 DIAGNOSIS — S72142D Displaced intertrochanteric fracture of left femur, subsequent encounter for closed fracture with routine healing: Secondary | ICD-10-CM | POA: Diagnosis not present

## 2023-05-13 DIAGNOSIS — K5900 Constipation, unspecified: Secondary | ICD-10-CM | POA: Diagnosis not present

## 2023-05-13 DIAGNOSIS — R2681 Unsteadiness on feet: Secondary | ICD-10-CM | POA: Diagnosis not present

## 2023-05-15 DIAGNOSIS — K5900 Constipation, unspecified: Secondary | ICD-10-CM | POA: Diagnosis not present

## 2023-05-15 DIAGNOSIS — R2681 Unsteadiness on feet: Secondary | ICD-10-CM | POA: Diagnosis not present

## 2023-05-15 DIAGNOSIS — Z9181 History of falling: Secondary | ICD-10-CM | POA: Diagnosis not present

## 2023-05-15 DIAGNOSIS — M6281 Muscle weakness (generalized): Secondary | ICD-10-CM | POA: Diagnosis not present

## 2023-05-15 DIAGNOSIS — S72142D Displaced intertrochanteric fracture of left femur, subsequent encounter for closed fracture with routine healing: Secondary | ICD-10-CM | POA: Diagnosis not present

## 2023-05-15 DIAGNOSIS — G8194 Hemiplegia, unspecified affecting left nondominant side: Secondary | ICD-10-CM | POA: Diagnosis not present

## 2023-05-19 ENCOUNTER — Ambulatory Visit: Payer: Medicare Other | Admitting: Nurse Practitioner

## 2023-05-19 DIAGNOSIS — S72142D Displaced intertrochanteric fracture of left femur, subsequent encounter for closed fracture with routine healing: Secondary | ICD-10-CM | POA: Diagnosis not present

## 2023-05-19 DIAGNOSIS — M6281 Muscle weakness (generalized): Secondary | ICD-10-CM | POA: Diagnosis not present

## 2023-05-19 DIAGNOSIS — R2681 Unsteadiness on feet: Secondary | ICD-10-CM | POA: Diagnosis not present

## 2023-05-19 DIAGNOSIS — K5900 Constipation, unspecified: Secondary | ICD-10-CM | POA: Diagnosis not present

## 2023-05-19 DIAGNOSIS — G8194 Hemiplegia, unspecified affecting left nondominant side: Secondary | ICD-10-CM | POA: Diagnosis not present

## 2023-05-19 DIAGNOSIS — Z9181 History of falling: Secondary | ICD-10-CM | POA: Diagnosis not present

## 2023-05-22 DIAGNOSIS — M6281 Muscle weakness (generalized): Secondary | ICD-10-CM | POA: Diagnosis not present

## 2023-05-22 DIAGNOSIS — R2681 Unsteadiness on feet: Secondary | ICD-10-CM | POA: Diagnosis not present

## 2023-05-22 DIAGNOSIS — G8194 Hemiplegia, unspecified affecting left nondominant side: Secondary | ICD-10-CM | POA: Diagnosis not present

## 2023-05-22 DIAGNOSIS — Z9181 History of falling: Secondary | ICD-10-CM | POA: Diagnosis not present

## 2023-05-22 DIAGNOSIS — S72142D Displaced intertrochanteric fracture of left femur, subsequent encounter for closed fracture with routine healing: Secondary | ICD-10-CM | POA: Diagnosis not present

## 2023-05-22 DIAGNOSIS — K5900 Constipation, unspecified: Secondary | ICD-10-CM | POA: Diagnosis not present

## 2023-05-26 DIAGNOSIS — Z9181 History of falling: Secondary | ICD-10-CM | POA: Diagnosis not present

## 2023-05-26 DIAGNOSIS — K5900 Constipation, unspecified: Secondary | ICD-10-CM | POA: Diagnosis not present

## 2023-05-26 DIAGNOSIS — R2681 Unsteadiness on feet: Secondary | ICD-10-CM | POA: Diagnosis not present

## 2023-05-26 DIAGNOSIS — G8194 Hemiplegia, unspecified affecting left nondominant side: Secondary | ICD-10-CM | POA: Diagnosis not present

## 2023-05-26 DIAGNOSIS — M6281 Muscle weakness (generalized): Secondary | ICD-10-CM | POA: Diagnosis not present

## 2023-05-26 DIAGNOSIS — S72142D Displaced intertrochanteric fracture of left femur, subsequent encounter for closed fracture with routine healing: Secondary | ICD-10-CM | POA: Diagnosis not present

## 2023-05-27 ENCOUNTER — Other Ambulatory Visit: Payer: Self-pay | Admitting: *Deleted

## 2023-05-27 NOTE — Patient Outreach (Signed)
Post-Acute Care Coordinator follow up. Mr. Lahrman resides in [facility]. Screening for potential care coordination/ chronic care management services as a benefit of health plan.  Collaboration update received from Le[place]on, Sheliah Hatch Child psychotherapist. Anticipated transition plan is likely home at the end of the month. Last covered date is 05/29/23. Will transition to [facility] 05/30/23.   No identifiable care coordination needs.  Raiford Noble, MSN, RN, BSN [place]  [facility], [facility] RN Post- Acute Care Coordinator Direct Dial: [phone]

## 2023-05-28 DIAGNOSIS — R2681 Unsteadiness on feet: Secondary | ICD-10-CM | POA: Diagnosis not present

## 2023-05-28 DIAGNOSIS — Z9181 History of falling: Secondary | ICD-10-CM | POA: Diagnosis not present

## 2023-05-28 DIAGNOSIS — M6281 Muscle weakness (generalized): Secondary | ICD-10-CM | POA: Diagnosis not present

## 2023-05-28 DIAGNOSIS — S72142D Displaced intertrochanteric fracture of left femur, subsequent encounter for closed fracture with routine healing: Secondary | ICD-10-CM | POA: Diagnosis not present

## 2023-05-28 DIAGNOSIS — G8194 Hemiplegia, unspecified affecting left nondominant side: Secondary | ICD-10-CM | POA: Diagnosis not present

## 2023-05-28 DIAGNOSIS — K5900 Constipation, unspecified: Secondary | ICD-10-CM | POA: Diagnosis not present

## 2023-05-31 DIAGNOSIS — M6281 Muscle weakness (generalized): Secondary | ICD-10-CM | POA: Diagnosis not present

## 2023-05-31 DIAGNOSIS — R2689 Other abnormalities of gait and mobility: Secondary | ICD-10-CM | POA: Diagnosis not present

## 2023-05-31 DIAGNOSIS — Z9181 History of falling: Secondary | ICD-10-CM | POA: Diagnosis not present

## 2023-05-31 DIAGNOSIS — S72142D Displaced intertrochanteric fracture of left femur, subsequent encounter for closed fracture with routine healing: Secondary | ICD-10-CM | POA: Diagnosis not present

## 2023-05-31 DIAGNOSIS — I69354 Hemiplegia and hemiparesis following cerebral infarction affecting left non-dominant side: Secondary | ICD-10-CM | POA: Diagnosis not present

## 2023-06-01 DIAGNOSIS — R2689 Other abnormalities of gait and mobility: Secondary | ICD-10-CM | POA: Diagnosis not present

## 2023-06-01 DIAGNOSIS — Z9181 History of falling: Secondary | ICD-10-CM | POA: Diagnosis not present

## 2023-06-01 DIAGNOSIS — S72142D Displaced intertrochanteric fracture of left femur, subsequent encounter for closed fracture with routine healing: Secondary | ICD-10-CM | POA: Diagnosis not present

## 2023-06-01 DIAGNOSIS — I69354 Hemiplegia and hemiparesis following cerebral infarction affecting left non-dominant side: Secondary | ICD-10-CM | POA: Diagnosis not present

## 2023-06-01 DIAGNOSIS — M6281 Muscle weakness (generalized): Secondary | ICD-10-CM | POA: Diagnosis not present

## 2023-06-02 DIAGNOSIS — S72142D Displaced intertrochanteric fracture of left femur, subsequent encounter for closed fracture with routine healing: Secondary | ICD-10-CM | POA: Diagnosis not present

## 2023-06-02 DIAGNOSIS — Z9181 History of falling: Secondary | ICD-10-CM | POA: Diagnosis not present

## 2023-06-02 DIAGNOSIS — R2689 Other abnormalities of gait and mobility: Secondary | ICD-10-CM | POA: Diagnosis not present

## 2023-06-02 DIAGNOSIS — M6281 Muscle weakness (generalized): Secondary | ICD-10-CM | POA: Diagnosis not present

## 2023-06-02 DIAGNOSIS — I69354 Hemiplegia and hemiparesis following cerebral infarction affecting left non-dominant side: Secondary | ICD-10-CM | POA: Diagnosis not present

## 2023-06-03 DIAGNOSIS — R2689 Other abnormalities of gait and mobility: Secondary | ICD-10-CM | POA: Diagnosis not present

## 2023-06-03 DIAGNOSIS — I69354 Hemiplegia and hemiparesis following cerebral infarction affecting left non-dominant side: Secondary | ICD-10-CM | POA: Diagnosis not present

## 2023-06-03 DIAGNOSIS — Z9181 History of falling: Secondary | ICD-10-CM | POA: Diagnosis not present

## 2023-06-03 DIAGNOSIS — S72142D Displaced intertrochanteric fracture of left femur, subsequent encounter for closed fracture with routine healing: Secondary | ICD-10-CM | POA: Diagnosis not present

## 2023-06-03 DIAGNOSIS — M6281 Muscle weakness (generalized): Secondary | ICD-10-CM | POA: Diagnosis not present

## 2023-06-03 DIAGNOSIS — H2702 Aphakia, left eye: Secondary | ICD-10-CM | POA: Diagnosis not present

## 2023-06-03 DIAGNOSIS — H1789 Other corneal scars and opacities: Secondary | ICD-10-CM | POA: Diagnosis not present

## 2023-06-03 DIAGNOSIS — Q131 Absence of iris: Secondary | ICD-10-CM | POA: Diagnosis not present

## 2023-06-03 DIAGNOSIS — H25811 Combined forms of age-related cataract, right eye: Secondary | ICD-10-CM | POA: Diagnosis not present

## 2023-06-04 DIAGNOSIS — Z9181 History of falling: Secondary | ICD-10-CM | POA: Diagnosis not present

## 2023-06-04 DIAGNOSIS — M6281 Muscle weakness (generalized): Secondary | ICD-10-CM | POA: Diagnosis not present

## 2023-06-04 DIAGNOSIS — R2689 Other abnormalities of gait and mobility: Secondary | ICD-10-CM | POA: Diagnosis not present

## 2023-06-04 DIAGNOSIS — I69354 Hemiplegia and hemiparesis following cerebral infarction affecting left non-dominant side: Secondary | ICD-10-CM | POA: Diagnosis not present

## 2023-06-04 DIAGNOSIS — S72142D Displaced intertrochanteric fracture of left femur, subsequent encounter for closed fracture with routine healing: Secondary | ICD-10-CM | POA: Diagnosis not present

## 2023-06-07 DIAGNOSIS — Z9181 History of falling: Secondary | ICD-10-CM | POA: Diagnosis not present

## 2023-06-07 DIAGNOSIS — R2689 Other abnormalities of gait and mobility: Secondary | ICD-10-CM | POA: Diagnosis not present

## 2023-06-07 DIAGNOSIS — M6281 Muscle weakness (generalized): Secondary | ICD-10-CM | POA: Diagnosis not present

## 2023-06-07 DIAGNOSIS — I69354 Hemiplegia and hemiparesis following cerebral infarction affecting left non-dominant side: Secondary | ICD-10-CM | POA: Diagnosis not present

## 2023-06-07 DIAGNOSIS — S72142D Displaced intertrochanteric fracture of left femur, subsequent encounter for closed fracture with routine healing: Secondary | ICD-10-CM | POA: Diagnosis not present

## 2023-06-09 DIAGNOSIS — M6281 Muscle weakness (generalized): Secondary | ICD-10-CM | POA: Diagnosis not present

## 2023-06-09 DIAGNOSIS — I69354 Hemiplegia and hemiparesis following cerebral infarction affecting left non-dominant side: Secondary | ICD-10-CM | POA: Diagnosis not present

## 2023-06-09 DIAGNOSIS — Z9181 History of falling: Secondary | ICD-10-CM | POA: Diagnosis not present

## 2023-06-09 DIAGNOSIS — R2689 Other abnormalities of gait and mobility: Secondary | ICD-10-CM | POA: Diagnosis not present

## 2023-06-09 DIAGNOSIS — S72142D Displaced intertrochanteric fracture of left femur, subsequent encounter for closed fracture with routine healing: Secondary | ICD-10-CM | POA: Diagnosis not present

## 2023-06-10 DIAGNOSIS — I69354 Hemiplegia and hemiparesis following cerebral infarction affecting left non-dominant side: Secondary | ICD-10-CM | POA: Diagnosis not present

## 2023-06-10 DIAGNOSIS — Z9181 History of falling: Secondary | ICD-10-CM | POA: Diagnosis not present

## 2023-06-10 DIAGNOSIS — M6281 Muscle weakness (generalized): Secondary | ICD-10-CM | POA: Diagnosis not present

## 2023-06-10 DIAGNOSIS — S72142D Displaced intertrochanteric fracture of left femur, subsequent encounter for closed fracture with routine healing: Secondary | ICD-10-CM | POA: Diagnosis not present

## 2023-06-10 DIAGNOSIS — R2689 Other abnormalities of gait and mobility: Secondary | ICD-10-CM | POA: Diagnosis not present

## 2023-06-12 DIAGNOSIS — S72142D Displaced intertrochanteric fracture of left femur, subsequent encounter for closed fracture with routine healing: Secondary | ICD-10-CM | POA: Diagnosis not present

## 2023-06-12 DIAGNOSIS — Z9181 History of falling: Secondary | ICD-10-CM | POA: Diagnosis not present

## 2023-06-12 DIAGNOSIS — R2689 Other abnormalities of gait and mobility: Secondary | ICD-10-CM | POA: Diagnosis not present

## 2023-06-12 DIAGNOSIS — I69354 Hemiplegia and hemiparesis following cerebral infarction affecting left non-dominant side: Secondary | ICD-10-CM | POA: Diagnosis not present

## 2023-06-12 DIAGNOSIS — M6281 Muscle weakness (generalized): Secondary | ICD-10-CM | POA: Diagnosis not present

## 2023-06-14 DIAGNOSIS — M6281 Muscle weakness (generalized): Secondary | ICD-10-CM | POA: Diagnosis not present

## 2023-06-14 DIAGNOSIS — I69354 Hemiplegia and hemiparesis following cerebral infarction affecting left non-dominant side: Secondary | ICD-10-CM | POA: Diagnosis not present

## 2023-06-14 DIAGNOSIS — R2689 Other abnormalities of gait and mobility: Secondary | ICD-10-CM | POA: Diagnosis not present

## 2023-06-14 DIAGNOSIS — Z9181 History of falling: Secondary | ICD-10-CM | POA: Diagnosis not present

## 2023-06-14 DIAGNOSIS — S72142D Displaced intertrochanteric fracture of left femur, subsequent encounter for closed fracture with routine healing: Secondary | ICD-10-CM | POA: Diagnosis not present

## 2023-06-16 DIAGNOSIS — M6281 Muscle weakness (generalized): Secondary | ICD-10-CM | POA: Diagnosis not present

## 2023-06-16 DIAGNOSIS — R2689 Other abnormalities of gait and mobility: Secondary | ICD-10-CM | POA: Diagnosis not present

## 2023-06-16 DIAGNOSIS — Z9181 History of falling: Secondary | ICD-10-CM | POA: Diagnosis not present

## 2023-06-16 DIAGNOSIS — S72142D Displaced intertrochanteric fracture of left femur, subsequent encounter for closed fracture with routine healing: Secondary | ICD-10-CM | POA: Diagnosis not present

## 2023-06-16 DIAGNOSIS — I69354 Hemiplegia and hemiparesis following cerebral infarction affecting left non-dominant side: Secondary | ICD-10-CM | POA: Diagnosis not present

## 2023-06-17 DIAGNOSIS — I69354 Hemiplegia and hemiparesis following cerebral infarction affecting left non-dominant side: Secondary | ICD-10-CM | POA: Diagnosis not present

## 2023-06-17 DIAGNOSIS — Z9181 History of falling: Secondary | ICD-10-CM | POA: Diagnosis not present

## 2023-06-17 DIAGNOSIS — R2689 Other abnormalities of gait and mobility: Secondary | ICD-10-CM | POA: Diagnosis not present

## 2023-06-17 DIAGNOSIS — S72142D Displaced intertrochanteric fracture of left femur, subsequent encounter for closed fracture with routine healing: Secondary | ICD-10-CM | POA: Diagnosis not present

## 2023-06-17 DIAGNOSIS — M6281 Muscle weakness (generalized): Secondary | ICD-10-CM | POA: Diagnosis not present

## 2023-06-18 DIAGNOSIS — M6281 Muscle weakness (generalized): Secondary | ICD-10-CM | POA: Diagnosis not present

## 2023-06-18 DIAGNOSIS — Z9181 History of falling: Secondary | ICD-10-CM | POA: Diagnosis not present

## 2023-06-18 DIAGNOSIS — I69354 Hemiplegia and hemiparesis following cerebral infarction affecting left non-dominant side: Secondary | ICD-10-CM | POA: Diagnosis not present

## 2023-06-18 DIAGNOSIS — S72142D Displaced intertrochanteric fracture of left femur, subsequent encounter for closed fracture with routine healing: Secondary | ICD-10-CM | POA: Diagnosis not present

## 2023-06-18 DIAGNOSIS — R2689 Other abnormalities of gait and mobility: Secondary | ICD-10-CM | POA: Diagnosis not present

## 2023-06-19 DIAGNOSIS — S72142D Displaced intertrochanteric fracture of left femur, subsequent encounter for closed fracture with routine healing: Secondary | ICD-10-CM | POA: Diagnosis not present

## 2023-06-19 DIAGNOSIS — R2689 Other abnormalities of gait and mobility: Secondary | ICD-10-CM | POA: Diagnosis not present

## 2023-06-19 DIAGNOSIS — Z9181 History of falling: Secondary | ICD-10-CM | POA: Diagnosis not present

## 2023-06-19 DIAGNOSIS — I69354 Hemiplegia and hemiparesis following cerebral infarction affecting left non-dominant side: Secondary | ICD-10-CM | POA: Diagnosis not present

## 2023-06-19 DIAGNOSIS — M6281 Muscle weakness (generalized): Secondary | ICD-10-CM | POA: Diagnosis not present

## 2023-06-23 DIAGNOSIS — Z8639 Personal history of other endocrine, nutritional and metabolic disease: Secondary | ICD-10-CM | POA: Diagnosis not present

## 2023-06-23 DIAGNOSIS — R2689 Other abnormalities of gait and mobility: Secondary | ICD-10-CM | POA: Diagnosis not present

## 2023-06-23 DIAGNOSIS — K5901 Slow transit constipation: Secondary | ICD-10-CM | POA: Diagnosis not present

## 2023-06-23 DIAGNOSIS — I10 Essential (primary) hypertension: Secondary | ICD-10-CM | POA: Diagnosis not present

## 2023-06-23 DIAGNOSIS — Z79899 Other long term (current) drug therapy: Secondary | ICD-10-CM | POA: Diagnosis not present

## 2023-06-23 DIAGNOSIS — R635 Abnormal weight gain: Secondary | ICD-10-CM | POA: Diagnosis not present

## 2023-06-23 DIAGNOSIS — Z9181 History of falling: Secondary | ICD-10-CM | POA: Diagnosis not present

## 2023-06-23 DIAGNOSIS — Z6823 Body mass index (BMI) 23.0-23.9, adult: Secondary | ICD-10-CM | POA: Diagnosis not present

## 2023-06-23 DIAGNOSIS — S72142D Displaced intertrochanteric fracture of left femur, subsequent encounter for closed fracture with routine healing: Secondary | ICD-10-CM | POA: Diagnosis not present

## 2023-06-23 DIAGNOSIS — H53469 Homonymous bilateral field defects, unspecified side: Secondary | ICD-10-CM | POA: Diagnosis not present

## 2023-06-23 DIAGNOSIS — Z7982 Long term (current) use of aspirin: Secondary | ICD-10-CM | POA: Diagnosis not present

## 2023-06-23 DIAGNOSIS — I69354 Hemiplegia and hemiparesis following cerebral infarction affecting left non-dominant side: Secondary | ICD-10-CM | POA: Diagnosis not present

## 2023-06-23 DIAGNOSIS — S72145D Nondisplaced intertrochanteric fracture of left femur, subsequent encounter for closed fracture with routine healing: Secondary | ICD-10-CM | POA: Diagnosis not present

## 2023-06-23 DIAGNOSIS — M6281 Muscle weakness (generalized): Secondary | ICD-10-CM | POA: Diagnosis not present

## 2023-06-23 DIAGNOSIS — D509 Iron deficiency anemia, unspecified: Secondary | ICD-10-CM | POA: Diagnosis not present

## 2023-06-24 DIAGNOSIS — R2689 Other abnormalities of gait and mobility: Secondary | ICD-10-CM | POA: Diagnosis not present

## 2023-06-24 DIAGNOSIS — S72142D Displaced intertrochanteric fracture of left femur, subsequent encounter for closed fracture with routine healing: Secondary | ICD-10-CM | POA: Diagnosis not present

## 2023-06-24 DIAGNOSIS — M6281 Muscle weakness (generalized): Secondary | ICD-10-CM | POA: Diagnosis not present

## 2023-06-24 DIAGNOSIS — Z9181 History of falling: Secondary | ICD-10-CM | POA: Diagnosis not present

## 2023-06-24 DIAGNOSIS — I69354 Hemiplegia and hemiparesis following cerebral infarction affecting left non-dominant side: Secondary | ICD-10-CM | POA: Diagnosis not present

## 2023-06-25 DIAGNOSIS — M6281 Muscle weakness (generalized): Secondary | ICD-10-CM | POA: Diagnosis not present

## 2023-06-25 DIAGNOSIS — Z9181 History of falling: Secondary | ICD-10-CM | POA: Diagnosis not present

## 2023-06-25 DIAGNOSIS — S72142D Displaced intertrochanteric fracture of left femur, subsequent encounter for closed fracture with routine healing: Secondary | ICD-10-CM | POA: Diagnosis not present

## 2023-06-25 DIAGNOSIS — R2689 Other abnormalities of gait and mobility: Secondary | ICD-10-CM | POA: Diagnosis not present

## 2023-06-25 DIAGNOSIS — I69354 Hemiplegia and hemiparesis following cerebral infarction affecting left non-dominant side: Secondary | ICD-10-CM | POA: Diagnosis not present

## 2023-06-30 DIAGNOSIS — I69354 Hemiplegia and hemiparesis following cerebral infarction affecting left non-dominant side: Secondary | ICD-10-CM | POA: Diagnosis not present

## 2023-06-30 DIAGNOSIS — R2689 Other abnormalities of gait and mobility: Secondary | ICD-10-CM | POA: Diagnosis not present

## 2023-06-30 DIAGNOSIS — Z9181 History of falling: Secondary | ICD-10-CM | POA: Diagnosis not present

## 2023-06-30 DIAGNOSIS — S72142D Displaced intertrochanteric fracture of left femur, subsequent encounter for closed fracture with routine healing: Secondary | ICD-10-CM | POA: Diagnosis not present

## 2023-06-30 DIAGNOSIS — M6281 Muscle weakness (generalized): Secondary | ICD-10-CM | POA: Diagnosis not present

## 2023-07-01 DIAGNOSIS — S72142D Displaced intertrochanteric fracture of left femur, subsequent encounter for closed fracture with routine healing: Secondary | ICD-10-CM | POA: Diagnosis not present

## 2023-07-01 DIAGNOSIS — Z9181 History of falling: Secondary | ICD-10-CM | POA: Diagnosis not present

## 2023-07-01 DIAGNOSIS — I69354 Hemiplegia and hemiparesis following cerebral infarction affecting left non-dominant side: Secondary | ICD-10-CM | POA: Diagnosis not present

## 2023-07-01 DIAGNOSIS — M6281 Muscle weakness (generalized): Secondary | ICD-10-CM | POA: Diagnosis not present

## 2023-07-01 DIAGNOSIS — R2689 Other abnormalities of gait and mobility: Secondary | ICD-10-CM | POA: Diagnosis not present

## 2023-07-02 DIAGNOSIS — S72142D Displaced intertrochanteric fracture of left femur, subsequent encounter for closed fracture with routine healing: Secondary | ICD-10-CM | POA: Diagnosis not present

## 2023-07-02 DIAGNOSIS — M6281 Muscle weakness (generalized): Secondary | ICD-10-CM | POA: Diagnosis not present

## 2023-07-02 DIAGNOSIS — I69354 Hemiplegia and hemiparesis following cerebral infarction affecting left non-dominant side: Secondary | ICD-10-CM | POA: Diagnosis not present

## 2023-07-02 DIAGNOSIS — Z9181 History of falling: Secondary | ICD-10-CM | POA: Diagnosis not present

## 2023-07-02 DIAGNOSIS — R2689 Other abnormalities of gait and mobility: Secondary | ICD-10-CM | POA: Diagnosis not present

## 2023-07-03 DIAGNOSIS — R2689 Other abnormalities of gait and mobility: Secondary | ICD-10-CM | POA: Diagnosis not present

## 2023-07-03 DIAGNOSIS — S72142D Displaced intertrochanteric fracture of left femur, subsequent encounter for closed fracture with routine healing: Secondary | ICD-10-CM | POA: Diagnosis not present

## 2023-07-03 DIAGNOSIS — M6281 Muscle weakness (generalized): Secondary | ICD-10-CM | POA: Diagnosis not present

## 2023-07-03 DIAGNOSIS — Z9181 History of falling: Secondary | ICD-10-CM | POA: Diagnosis not present

## 2023-07-03 DIAGNOSIS — I69354 Hemiplegia and hemiparesis following cerebral infarction affecting left non-dominant side: Secondary | ICD-10-CM | POA: Diagnosis not present

## 2023-07-04 DIAGNOSIS — S72142D Displaced intertrochanteric fracture of left femur, subsequent encounter for closed fracture with routine healing: Secondary | ICD-10-CM | POA: Diagnosis not present

## 2023-07-04 DIAGNOSIS — M6281 Muscle weakness (generalized): Secondary | ICD-10-CM | POA: Diagnosis not present

## 2023-07-04 DIAGNOSIS — I69354 Hemiplegia and hemiparesis following cerebral infarction affecting left non-dominant side: Secondary | ICD-10-CM | POA: Diagnosis not present

## 2023-07-04 DIAGNOSIS — Z9181 History of falling: Secondary | ICD-10-CM | POA: Diagnosis not present

## 2023-07-04 DIAGNOSIS — R2689 Other abnormalities of gait and mobility: Secondary | ICD-10-CM | POA: Diagnosis not present

## 2023-07-07 DIAGNOSIS — I69354 Hemiplegia and hemiparesis following cerebral infarction affecting left non-dominant side: Secondary | ICD-10-CM | POA: Diagnosis not present

## 2023-07-07 DIAGNOSIS — S72142D Displaced intertrochanteric fracture of left femur, subsequent encounter for closed fracture with routine healing: Secondary | ICD-10-CM | POA: Diagnosis not present

## 2023-07-07 DIAGNOSIS — R2689 Other abnormalities of gait and mobility: Secondary | ICD-10-CM | POA: Diagnosis not present

## 2023-07-07 DIAGNOSIS — Z9181 History of falling: Secondary | ICD-10-CM | POA: Diagnosis not present

## 2023-07-07 DIAGNOSIS — M6281 Muscle weakness (generalized): Secondary | ICD-10-CM | POA: Diagnosis not present

## 2023-07-08 ENCOUNTER — Telehealth: Payer: Self-pay

## 2023-07-08 DIAGNOSIS — M6281 Muscle weakness (generalized): Secondary | ICD-10-CM | POA: Diagnosis not present

## 2023-07-08 DIAGNOSIS — R2689 Other abnormalities of gait and mobility: Secondary | ICD-10-CM | POA: Diagnosis not present

## 2023-07-08 DIAGNOSIS — S72142D Displaced intertrochanteric fracture of left femur, subsequent encounter for closed fracture with routine healing: Secondary | ICD-10-CM | POA: Diagnosis not present

## 2023-07-08 DIAGNOSIS — I69354 Hemiplegia and hemiparesis following cerebral infarction affecting left non-dominant side: Secondary | ICD-10-CM | POA: Diagnosis not present

## 2023-07-08 DIAGNOSIS — Z9181 History of falling: Secondary | ICD-10-CM | POA: Diagnosis not present

## 2023-07-08 NOTE — Telephone Encounter (Signed)
I do not see anything earlier at this time. Patient is currently on waitlist.

## 2023-07-08 NOTE — Telephone Encounter (Signed)
Pt daughter lvm on sleep lab phone asking if there is Edmund earlier appt available for pt - please return daughter's call at [phone].

## 2023-07-10 DIAGNOSIS — S72142D Displaced intertrochanteric fracture of left femur, subsequent encounter for closed fracture with routine healing: Secondary | ICD-10-CM | POA: Diagnosis not present

## 2023-07-10 DIAGNOSIS — Z9181 History of falling: Secondary | ICD-10-CM | POA: Diagnosis not present

## 2023-07-10 DIAGNOSIS — M6281 Muscle weakness (generalized): Secondary | ICD-10-CM | POA: Diagnosis not present

## 2023-07-10 DIAGNOSIS — I69354 Hemiplegia and hemiparesis following cerebral infarction affecting left non-dominant side: Secondary | ICD-10-CM | POA: Diagnosis not present

## 2023-07-10 DIAGNOSIS — R2689 Other abnormalities of gait and mobility: Secondary | ICD-10-CM | POA: Diagnosis not present

## 2023-07-11 DIAGNOSIS — S72142D Displaced intertrochanteric fracture of left femur, subsequent encounter for closed fracture with routine healing: Secondary | ICD-10-CM | POA: Diagnosis not present

## 2023-07-11 DIAGNOSIS — M6281 Muscle weakness (generalized): Secondary | ICD-10-CM | POA: Diagnosis not present

## 2023-07-11 DIAGNOSIS — Z9181 History of falling: Secondary | ICD-10-CM | POA: Diagnosis not present

## 2023-07-11 DIAGNOSIS — I69354 Hemiplegia and hemiparesis following cerebral infarction affecting left non-dominant side: Secondary | ICD-10-CM | POA: Diagnosis not present

## 2023-07-11 DIAGNOSIS — R2689 Other abnormalities of gait and mobility: Secondary | ICD-10-CM | POA: Diagnosis not present

## 2023-07-13 DIAGNOSIS — I69354 Hemiplegia and hemiparesis following cerebral infarction affecting left non-dominant side: Secondary | ICD-10-CM | POA: Diagnosis not present

## 2023-07-13 DIAGNOSIS — S72142D Displaced intertrochanteric fracture of left femur, subsequent encounter for closed fracture with routine healing: Secondary | ICD-10-CM | POA: Diagnosis not present

## 2023-07-13 DIAGNOSIS — M6281 Muscle weakness (generalized): Secondary | ICD-10-CM | POA: Diagnosis not present

## 2023-07-13 DIAGNOSIS — R2689 Other abnormalities of gait and mobility: Secondary | ICD-10-CM | POA: Diagnosis not present

## 2023-07-13 DIAGNOSIS — Z9181 History of falling: Secondary | ICD-10-CM | POA: Diagnosis not present

## 2023-07-14 DIAGNOSIS — R2689 Other abnormalities of gait and mobility: Secondary | ICD-10-CM | POA: Diagnosis not present

## 2023-07-14 DIAGNOSIS — Z9181 History of falling: Secondary | ICD-10-CM | POA: Diagnosis not present

## 2023-07-14 DIAGNOSIS — S72142D Displaced intertrochanteric fracture of left femur, subsequent encounter for closed fracture with routine healing: Secondary | ICD-10-CM | POA: Diagnosis not present

## 2023-07-14 DIAGNOSIS — I69354 Hemiplegia and hemiparesis following cerebral infarction affecting left non-dominant side: Secondary | ICD-10-CM | POA: Diagnosis not present

## 2023-07-14 DIAGNOSIS — M6281 Muscle weakness (generalized): Secondary | ICD-10-CM | POA: Diagnosis not present

## 2023-07-15 DIAGNOSIS — Z9181 History of falling: Secondary | ICD-10-CM | POA: Diagnosis not present

## 2023-07-15 DIAGNOSIS — R2689 Other abnormalities of gait and mobility: Secondary | ICD-10-CM | POA: Diagnosis not present

## 2023-07-15 DIAGNOSIS — S72142D Displaced intertrochanteric fracture of left femur, subsequent encounter for closed fracture with routine healing: Secondary | ICD-10-CM | POA: Diagnosis not present

## 2023-07-15 DIAGNOSIS — I69354 Hemiplegia and hemiparesis following cerebral infarction affecting left non-dominant side: Secondary | ICD-10-CM | POA: Diagnosis not present

## 2023-07-15 DIAGNOSIS — M6281 Muscle weakness (generalized): Secondary | ICD-10-CM | POA: Diagnosis not present

## 2023-07-16 DIAGNOSIS — I69354 Hemiplegia and hemiparesis following cerebral infarction affecting left non-dominant side: Secondary | ICD-10-CM | POA: Diagnosis not present

## 2023-07-16 DIAGNOSIS — S72142D Displaced intertrochanteric fracture of left femur, subsequent encounter for closed fracture with routine healing: Secondary | ICD-10-CM | POA: Diagnosis not present

## 2023-07-16 DIAGNOSIS — R2689 Other abnormalities of gait and mobility: Secondary | ICD-10-CM | POA: Diagnosis not present

## 2023-07-16 DIAGNOSIS — Z9181 History of falling: Secondary | ICD-10-CM | POA: Diagnosis not present

## 2023-07-16 DIAGNOSIS — M6281 Muscle weakness (generalized): Secondary | ICD-10-CM | POA: Diagnosis not present

## 2023-07-22 DIAGNOSIS — M6281 Muscle weakness (generalized): Secondary | ICD-10-CM | POA: Diagnosis not present

## 2023-07-22 DIAGNOSIS — R2689 Other abnormalities of gait and mobility: Secondary | ICD-10-CM | POA: Diagnosis not present

## 2023-07-22 DIAGNOSIS — I69354 Hemiplegia and hemiparesis following cerebral infarction affecting left non-dominant side: Secondary | ICD-10-CM | POA: Diagnosis not present

## 2023-07-22 DIAGNOSIS — Z9181 History of falling: Secondary | ICD-10-CM | POA: Diagnosis not present

## 2023-07-22 DIAGNOSIS — S72142D Displaced intertrochanteric fracture of left femur, subsequent encounter for closed fracture with routine healing: Secondary | ICD-10-CM | POA: Diagnosis not present

## 2023-07-24 DIAGNOSIS — R2689 Other abnormalities of gait and mobility: Secondary | ICD-10-CM | POA: Diagnosis not present

## 2023-07-24 DIAGNOSIS — Z9181 History of falling: Secondary | ICD-10-CM | POA: Diagnosis not present

## 2023-07-24 DIAGNOSIS — M6281 Muscle weakness (generalized): Secondary | ICD-10-CM | POA: Diagnosis not present

## 2023-07-24 DIAGNOSIS — S72142D Displaced intertrochanteric fracture of left femur, subsequent encounter for closed fracture with routine healing: Secondary | ICD-10-CM | POA: Diagnosis not present

## 2023-07-24 DIAGNOSIS — I69354 Hemiplegia and hemiparesis following cerebral infarction affecting left non-dominant side: Secondary | ICD-10-CM | POA: Diagnosis not present

## 2023-07-25 DIAGNOSIS — Z9181 History of falling: Secondary | ICD-10-CM | POA: Diagnosis not present

## 2023-07-25 DIAGNOSIS — R2689 Other abnormalities of gait and mobility: Secondary | ICD-10-CM | POA: Diagnosis not present

## 2023-07-25 DIAGNOSIS — M6281 Muscle weakness (generalized): Secondary | ICD-10-CM | POA: Diagnosis not present

## 2023-07-25 DIAGNOSIS — S72142D Displaced intertrochanteric fracture of left femur, subsequent encounter for closed fracture with routine healing: Secondary | ICD-10-CM | POA: Diagnosis not present

## 2023-07-25 DIAGNOSIS — I69354 Hemiplegia and hemiparesis following cerebral infarction affecting left non-dominant side: Secondary | ICD-10-CM | POA: Diagnosis not present

## 2023-07-29 DIAGNOSIS — M6281 Muscle weakness (generalized): Secondary | ICD-10-CM | POA: Diagnosis not present

## 2023-07-29 DIAGNOSIS — Z9181 History of falling: Secondary | ICD-10-CM | POA: Diagnosis not present

## 2023-07-29 DIAGNOSIS — I69354 Hemiplegia and hemiparesis following cerebral infarction affecting left non-dominant side: Secondary | ICD-10-CM | POA: Diagnosis not present

## 2023-07-29 DIAGNOSIS — R2689 Other abnormalities of gait and mobility: Secondary | ICD-10-CM | POA: Diagnosis not present

## 2023-07-29 DIAGNOSIS — S72142D Displaced intertrochanteric fracture of left femur, subsequent encounter for closed fracture with routine healing: Secondary | ICD-10-CM | POA: Diagnosis not present

## 2023-08-19 ENCOUNTER — Ambulatory Visit (INDEPENDENT_AMBULATORY_CARE_PROVIDER_SITE_OTHER): Payer: Medicare Other | Admitting: Neurology

## 2023-08-19 ENCOUNTER — Encounter: Payer: Self-pay | Admitting: Neurology

## 2023-08-19 VITALS — BP 151/63 | HR 68 | Ht 62.0 in

## 2023-08-19 DIAGNOSIS — G8112 Spastic hemiplegia affecting left dominant side: Secondary | ICD-10-CM

## 2023-08-19 DIAGNOSIS — F0150 Vascular dementia without behavioral disturbance: Secondary | ICD-10-CM | POA: Diagnosis not present
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
## 2023-08-19 NOTE — Patient Instructions (Addendum)
I had a long d/w patient and his daughter using [place] language interpreter about his remote stroke, dense left hemiplegia and homonymous hemianopsia and carotid occlusion, risk for recurrent stroke/TIAs, personally independently reviewed imaging studies and stroke evaluation results and answered questions.Continue aspirin 81 mg daily for secondary stroke prevention and maintain strict control of hypertension with blood pressure goal below 130/90, diabetes with hemoglobin A1c goal below 6.5% and lipids with LDL cholesterol goal below 70 mg/dL. I also advised the patient to eat a healthy diet with plenty of whole grains, cereals, fruits and vegetables, exercise regularly and maintain ideal body weight .Continue ongoing physical and occupational therapy.  Unfortunately patient has had neurological decline following his hip fracture and not shown significant improvement and will likely need long-term 24-hour nursing care support in a skilled nursing situation.  Check lab work for reversible causes of memory loss.  May consider trial of Namenda in the future if family is willing.  No scheduled follow-up appointment is necessary and may return as needed only.

## 2023-08-20 LAB — DEMENTIA PANEL
Homocysteine: 7.3 umol/L (ref 0.0–19.2)
RPR Ser Ql: NONREACTIVE
TSH: 1.22 u[IU]/mL (ref 0.450–4.500)
Vitamin B-12: 552 pg/mL (ref 232–1245)

## 2023-08-27 NOTE — Progress Notes (Signed)
Kindly inform the patient that lab work for reversible causes of memory loss was all satisfactory

## 2023-09-04 ENCOUNTER — Telehealth: Payer: Self-pay | Admitting: Neurology

## 2023-09-04 NOTE — Telephone Encounter (Signed)
-----   Message from Douglas Pruitt sent at 08/27/2023  7:30 PM EST ----- Kindly inform the patient that lab work for reversible causes of memory loss was all satisfactory.

## 2024-08-03 ENCOUNTER — Telehealth: Payer: Self-pay

## 2024-08-03 NOTE — Telephone Encounter (Signed)
 Copied from CRM [id number]. Topic: General - Other >> Jul 30, 2024  2:34 PM Thersia BROCKS wrote: Reason for RMF:Zfpob from [facility] called in stated she Spoke with patient and is starting physical therapy on Monday 01/05

## 2024-08-05 ENCOUNTER — Ambulatory Visit: Payer: Self-pay | Admitting: Family Medicine

## 2024-08-12 ENCOUNTER — Other Ambulatory Visit: Payer: Self-pay | Admitting: Nurse Practitioner

## 2024-08-12 DIAGNOSIS — E782 Mixed hyperlipidemia: Secondary | ICD-10-CM

## 2024-08-16 ENCOUNTER — Ambulatory Visit: Payer: Self-pay

## 2024-08-16 VITALS — BP 130/80 | HR 75 | Temp 98.5°F | Ht 62.0 in

## 2024-08-16 DIAGNOSIS — E782 Mixed hyperlipidemia: Secondary | ICD-10-CM

## 2024-08-16 DIAGNOSIS — Z862 Personal history of diseases of the blood and blood-forming organs and certain disorders involving the immune mechanism: Secondary | ICD-10-CM

## 2024-08-16 DIAGNOSIS — M79672 Pain in left foot: Secondary | ICD-10-CM

## 2024-08-16 DIAGNOSIS — Z8673 Personal history of transient ischemic attack (TIA), and cerebral infarction without residual deficits: Secondary | ICD-10-CM

## 2024-08-16 DIAGNOSIS — I10 Essential (primary) hypertension: Secondary | ICD-10-CM

## 2024-08-16 NOTE — Assessment & Plan Note (Addendum)
 Controlled on lisinopril  5 mg once a day.  I will get a CMP on 08/16/2024.  He will follow-up with Georgina, FNP in 2 weeks. Orders:   Comprehensive metabolic panel with GFR

## 2024-08-16 NOTE — Assessment & Plan Note (Addendum)
 I will continue the atorvastatin  40 mg once a day.  I will get a lipid panel on 08/16/2024. Orders:   Lipid panel

## 2024-08-16 NOTE — Progress Notes (Signed)
 [name], CMA,acting as a scribe for Gaither JONELLE Fischer, DO.,have documented all relevant documentation on the behalf of [name], DO,as directed by  Gaither JONELLE Fischer, DO while in the presence of Gaither JONELLE Fischer, DO.  Subjective:  Patient ID: Douglas Pruitt , male    DOB: 1951/02/04 , 74 y.o.   MRN: [id number]  No chief complaint on file.   HPI  HPI   Past Medical History:  Diagnosis Date   Acute CVA (cerebrovascular accident) (HCC) 10/30/2021   CVA (cerebral vascular accident) (HCC) 10/22/2021   Hyperlipidemia    Hypertension      No family history on file.  Current Medications[1]   Allergies[2]   Review of Systems   There were no vitals filed for this visit. There is no height or weight on file to calculate BMI.  Wt Readings from Last 3 Encounters:  03/11/23 140 lb (63.5 kg)  11/27/22 130 lb (59 kg)  09/24/22 134 lb (60.8 kg)    The ASCVD Risk score (Arnett DK, et al., 2019) failed to calculate for the following reasons:   Risk score cannot be calculated because patient has a medical history suggesting prior/existing ASCVD   * - Cholesterol units were assumed  Objective:  Physical Exam      Assessment And Plan:   Assessment & Plan Essential hypertension  Mixed hyperlipidemia  History of stroke   No orders of the defined types were placed in this encounter.    No follow-ups on file.  Patient was given opportunity to ask questions. Patient verbalized understanding of the plan and was able to repeat key elements of the plan. All questions were answered to their satisfaction.    LILLETTE Gaither JONELLE Fischer, DO, have reviewed all documentation for this visit. The documentation on 08/16/24 for the exam, diagnosis, procedures, and orders are all accurate and complete.   IF YOU HAVE BEEN REFERRED TO A SPECIALIST, IT MAY TAKE 1-2 WEEKS TO SCHEDULE/PROCESS THE REFERRAL. IF YOU HAVE NOT HEARD FROM US /SPECIALIST IN TWO WEEKS, PLEASE GIVE US  A CALL AT [phone].     [1]   Current Outpatient Medications:    acetaminophen  (TYLENOL ) 500 MG tablet, Take 500 mg by mouth 2 (two) times daily., Disp: , Rfl:    ASPIRIN  LOW DOSE 81 MG tablet, TAKE 1 TABLET BY MOUTH EVERY MORNING *NEW PRESCRIPTION REQUEST*, Disp: 90 tablet, Rfl: 10   atorvastatin  (LIPITOR) 40 MG tablet, TAKE 1 TABLET BY MOUTH EVERY MORNING *NEW PRESCRIPTION REQUEST*, Disp: 90 tablet, Rfl: 10   baclofen  (LIORESAL ) 10 MG tablet, TAKE 1 TABLET(S) BY MOUTH 2 TIMES PER DAY MORNING AND BEDTIME *NEW PRESCRIPTION REQUEST*, Disp: 180 tablet, Rfl: 10   chlorhexidine  (HIBICLENS ) 4 % external liquid, Apply 15 mLs (1 Application total) topically as directed for 30 doses. Use as directed daily for 5 days every other week for 6 weeks. (Patient not taking: Reported on 08/19/2023), Disp: 946 mL, Rfl: 1   docusate sodium  (COLACE) 100 MG capsule, Take 1 capsule (100 mg total) by mouth daily as needed. (Patient taking differently: Take 100 mg by mouth daily as needed for mild constipation.), Disp: 90 capsule, Rfl: 1   enoxaparin  (LOVENOX ) 40 MG/0.4ML injection, Inject 0.4 mLs (40 mg total) into the skin daily for 28 days., Disp: 11.2 mL, Rfl: 0   FEROSUL 325 (65 Fe) MG tablet, TAKE 1 TABLET BY MOUTH EVERY MORNING *NEW PRESCRIPTION REQUEST*, Disp: 90 tablet, Rfl: 10   ferrous sulfate  325 (65 FE)  MG EC tablet, Take 1 tablet (325 mg total) by mouth 2 (two) times daily. (Patient not taking: Reported on 08/19/2023), Disp: 60 tablet, Rfl: 3   gabapentin  (NEURONTIN ) 300 MG capsule, TAKE 1 CAPSULE(300 MG) BY MOUTH DAILY (Patient taking differently: Take 300 mg by mouth daily.), Disp: 90 capsule, Rfl: 1   lisinopril  (ZESTRIL ) 2.5 MG tablet, Take 1 tablet (2.5 mg total) by mouth daily., Disp: 90 tablet, Rfl: 2   lisinopril  (ZESTRIL ) 5 MG tablet, TAKE 1 TABLET BY MOUTH EVERY MORNING *NEW PRESCRIPTION REQUEST*, Disp: 90 tablet, Rfl: 10   Multiple Vitamin (MULTIVITAMIN WITH MINERALS) TABS tablet, Take 1 tablet by mouth daily., Disp: 90 tablet,  Rfl: 0   ondansetron  (ZOFRAN ) 4 MG tablet, Take 4 mg by mouth every 8 (eight) hours as needed for nausea or vomiting., Disp: , Rfl:    oxyCODONE  (OXY IR/ROXICODONE ) 5 MG immediate release tablet, Take 1-2 tablets (5-10 mg total) by mouth every 8 (eight) hours as needed for breakthrough pain or severe pain ((for MODERATE breakthrough pain)). (Patient not taking: Reported on 08/19/2023), Disp: 30 tablet, Rfl: 0   polyethylene glycol (MIRALAX  / GLYCOLAX ) 17 g packet, Take 17 g by mouth daily., Disp: , Rfl:    tamsulosin  (FLOMAX ) 0.4 MG CAPS capsule, TAKE 1 CAPSULE BY MOUTH DAILY AT BEDTIME *NEW PRESCRIPTION REQUEST*, Disp: 90 capsule, Rfl: 10 [2] No Known Allergies

## 2024-08-16 NOTE — Progress Notes (Signed)
 "  Acute Office Visit  Subjective:     Patient ID: Douglas Pruitt, male    DOB: June 26, 1951, 74 y.o.   MRN: [id number]  Chief Complaint  Patient presents with   Hypertension    Patient presents today for a bp and chol follow up, Patient reports compliance with medication. Patient denies any chest pain, SOB, or headaches. Patient's daughter reports he was in rehab from Aug 2025 and was discharge on 07/29/2024. Patient's daughter reports they have no concerns since his discharge. Patient was at  [facility] and rehab.     This is a 74 year old male who is here with his daughter and son-in-law today.  He was just released from nursing rehab on 07/29/2024.  He was admitted to the nursing rehab in August, 2024.  Prior to him being mated to the nursing rehab He fell at home and fractured his left hip which did require surgery.  He is walking with a 4-prong cane.  He is living with his daughter and son-in-law.  He does have a history of a left great toe AMPUTATION.  He does have some tenderness over the bottom of his left first metatarsal joint.  His left foot pain has been going on since 07/29/2024 when he got home.  He does have mild shortness of breath with exertion.  He does have a past medical history of having a STROKE on 10/20/2021 which he is taking aspirin  81 mg once a day, HYPERTENSION which he is taking lisinopril  5 mg once a day, HYPERLIPIDEMIA which he is taking atorvastatin  40 mg once a day, PERIPHERAL NEUROPATHY which he is on gabapentin  300 mg once a day, IRON DEFICIENCY ANEMIA which he is taking FeroSul 325 mg once a day, and BPH which he is taking tamsulosin  0.4 mg once a day.  He does have mild shortness of breath with exertion but denies any chest pain/chest pressure.  He does have a history of a right eye CATARACT.     Review of Systems  Eyes:        Loss of vision L eye, R eye catarct  Respiratory:  Positive for cough and shortness of breath (with exertion). Negative for wheezing.        Mild  cough  Cardiovascular:  Positive for leg swelling (L foot). Negative for chest pain.  Musculoskeletal:  Positive for falls.       L knee/L foot  Neurological:  Positive for weakness (L sided weakness).       H/O stroke 10/20/21 affected the L side (arm/leg)  Psychiatric/Behavioral:  Positive for depression (Secondary to not walking without aid).         Objective:    Vitals:   08/16/24 0948  BP: 130/80  Pulse: 75  Temp: 98.5 F (36.9 C)  Height: 5' 2 (1.575 m)  TempSrc: Oral     Wt Readings from Last 3 Encounters:  03/11/23 140 lb (63.5 kg)  11/27/22 130 lb (59 kg)  09/24/22 134 lb (60.8 kg)      Physical Exam Vitals and nursing note reviewed.  Constitutional:      Appearance: Normal appearance.     Comments: He has gained 10 pounds of weight since 03/11/2023  HENT:     Head: Normocephalic.  Cardiovascular:     Rate and Rhythm: Normal rate and regular rhythm.  Pulmonary:     Effort: Pulmonary effort is normal.     Breath sounds: Normal breath sounds.  Abdominal:     General:  Bowel sounds are normal.     Palpations: Abdomen is soft.     Tenderness: There is abdominal tenderness (LLQ).  Musculoskeletal:     Right lower leg: No edema.     Left lower leg: No edema.     Comments: Mild tenderness with palpation over the plantar left first metatarsal joint   Skin:    Capillary Refill: Capillary refill takes 2 to 3 seconds.     Findings: Lesion present.     Comments: The presence of a slight erythematous circular macular skin lesion over the dorsum of the left distal medial foot with inspection.  The noted absence of the left great toe with inspection.  Neurological:     Mental Status: He is alert.     No results found for any visits on 08/16/24.      Assessment & Plan:   Assessment & Plan Essential hypertension Controlled on lisinopril  5 mg once a day.  I will get a CMP on 08/16/2024.  He will follow-up with Georgina, FNP in 2 weeks. Orders:   Comprehensive  metabolic panel with GFR  Mixed hyperlipidemia I will continue the atorvastatin  40 mg once a day.  I will get a lipid panel on 08/16/2024. Orders:   Lipid panel  History of stroke He is using a 4-prong cane to ambulate secondary to the left-sided stroke on 10/20/2021. Orders:   Comprehensive metabolic panel with GFR  History of anemia I will get a CBC with differential on 08/16/2024.  I will continue the the ferrous sulfate  325 mg once a day. Orders:   CBC with Diff  Left foot pain      Return in 2 weeks (on 08/30/2024), or [name]-f/u labs from 08/16/24, for 6 month bp check, needs AWV.  Douglas JONELLE Fischer, DO   "

## 2024-08-17 ENCOUNTER — Other Ambulatory Visit: Payer: Self-pay

## 2024-08-17 ENCOUNTER — Ambulatory Visit: Payer: Self-pay

## 2024-08-17 LAB — LIPID PANEL
Chol/HDL Ratio: 3.3 ratio (ref 0.0–5.0)
Cholesterol, Total: 121 mg/dL (ref 100–199)
HDL: 37 mg/dL — ABNORMAL LOW
LDL Chol Calc (NIH): 63 mg/dL (ref 0–99)
Triglycerides: 113 mg/dL (ref 0–149)
VLDL Cholesterol Cal: 21 mg/dL (ref 5–40)

## 2024-08-17 LAB — COMPREHENSIVE METABOLIC PANEL WITH GFR
ALT: 23 IU/L (ref 0–44)
AST: 19 IU/L (ref 0–40)
Albumin: 4.4 g/dL (ref 3.8–4.8)
Alkaline Phosphatase: 85 IU/L (ref 47–123)
BUN/Creatinine Ratio: 11 (ref 10–24)
BUN: 7 mg/dL — ABNORMAL LOW (ref 8–27)
Bilirubin Total: 0.7 mg/dL (ref 0.0–1.2)
CO2: 24 mmol/L (ref 20–29)
Calcium: 9.1 mg/dL (ref 8.6–10.2)
Chloride: 103 mmol/L (ref 96–106)
Creatinine, Ser: 0.63 mg/dL — ABNORMAL LOW (ref 0.76–1.27)
Globulin, Total: 2.9 g/dL (ref 1.5–4.5)
Glucose: 90 mg/dL (ref 70–99)
Potassium: 4.1 mmol/L (ref 3.5–5.2)
Sodium: 141 mmol/L (ref 134–144)
Total Protein: 7.3 g/dL (ref 6.0–8.5)
eGFR: 99 mL/min/1.73

## 2024-08-17 LAB — CBC WITH DIFFERENTIAL/PLATELET
Basophils Absolute: 0 x10E3/uL (ref 0.0–0.2)
Basos: 1 %
EOS (ABSOLUTE): 0.1 x10E3/uL (ref 0.0–0.4)
Eos: 1 %
Hematocrit: 41.3 % (ref 37.5–51.0)
Hemoglobin: 13.4 g/dL (ref 13.0–17.7)
Immature Grans (Abs): 0 x10E3/uL (ref 0.0–0.1)
Immature Granulocytes: 0 %
Lymphocytes Absolute: 3.3 x10E3/uL — ABNORMAL HIGH (ref 0.7–3.1)
Lymphs: 39 %
MCH: 28.5 pg (ref 26.6–33.0)
MCHC: 32.4 g/dL (ref 31.5–35.7)
MCV: 88 fL (ref 79–97)
Monocytes Absolute: 0.4 x10E3/uL (ref 0.1–0.9)
Monocytes: 4 %
Neutrophils Absolute: 4.6 x10E3/uL (ref 1.4–7.0)
Neutrophils: 55 %
Platelets: 173 x10E3/uL (ref 150–450)
RBC: 4.7 x10E6/uL (ref 4.14–5.80)
RDW: 13.5 % (ref 11.6–15.4)
WBC: 8.3 x10E3/uL (ref 3.4–10.8)

## 2024-08-17 MED ORDER — ACETAMINOPHEN 500 MG PO TABS: 500.0000 mg | ORAL_TABLET | Freq: Two times a day (BID) | ORAL | 2 refills | Status: DC

## 2024-08-17 NOTE — Addendum Note (Signed)
 Addended by: BERNARDO CARWIN on: 08/17/2024 09:14 AM   Modules accepted: Level of Service

## 2024-08-19 ENCOUNTER — Other Ambulatory Visit: Payer: Self-pay

## 2024-08-19 ENCOUNTER — Other Ambulatory Visit: Payer: Self-pay | Admitting: Nurse Practitioner

## 2024-08-19 DIAGNOSIS — E782 Mixed hyperlipidemia: Secondary | ICD-10-CM

## 2024-08-19 MED ORDER — ATORVASTATIN CALCIUM 40 MG PO TABS: 40.0000 mg | ORAL_TABLET | Freq: Every day | ORAL | 1 refills | Status: AC

## 2024-08-19 MED ORDER — ACETAMINOPHEN 500 MG PO TABS: 500.0000 mg | ORAL_TABLET | Freq: Two times a day (BID) | ORAL | 2 refills | Status: AC

## 2024-08-19 MED ORDER — ASPIRIN 81 MG PO TBEC: 81.0000 mg | DELAYED_RELEASE_TABLET | Freq: Every day | ORAL | 1 refills | Status: DC

## 2024-08-19 MED ORDER — TAMSULOSIN HCL 0.4 MG PO CAPS: 0.4000 mg | ORAL_CAPSULE | Freq: Every day | ORAL | 1 refills | Status: DC

## 2024-08-19 MED ORDER — BACLOFEN 10 MG PO TABS: 10.0000 mg | ORAL_TABLET | Freq: Two times a day (BID) | ORAL | 1 refills | Status: DC

## 2024-08-19 MED ORDER — GABAPENTIN 300 MG PO CAPS: 300.0000 mg | ORAL_CAPSULE | Freq: Every day | ORAL | 1 refills | Status: DC

## 2024-08-19 MED ORDER — LISINOPRIL 5 MG PO TABS: 5.0000 mg | ORAL_TABLET | Freq: Every day | ORAL | 1 refills | Status: DC

## 2024-08-19 MED ORDER — FERROUS SULFATE 325 (65 FE) MG PO TABS: 325.0000 mg | ORAL_TABLET | Freq: Every day | ORAL | 1 refills | Status: AC

## 2024-08-25 ENCOUNTER — Other Ambulatory Visit: Payer: Self-pay | Admitting: Nurse Practitioner

## 2024-08-31 ENCOUNTER — Other Ambulatory Visit: Payer: Self-pay

## 2024-08-31 MED ORDER — LISINOPRIL 5 MG PO TABS: 5.0000 mg | ORAL_TABLET | Freq: Every day | ORAL | 1 refills | Status: AC

## 2024-08-31 MED ORDER — DOCUSATE SODIUM 100 MG PO CAPS: 100.0000 mg | ORAL_CAPSULE | Freq: Every day | ORAL | 0 refills | Status: AC | PRN

## 2024-08-31 MED ORDER — GABAPENTIN 300 MG PO CAPS: 300.0000 mg | ORAL_CAPSULE | Freq: Every day | ORAL | 1 refills | Status: AC

## 2024-08-31 MED ORDER — BACLOFEN 10 MG PO TABS: 10.0000 mg | ORAL_TABLET | Freq: Two times a day (BID) | ORAL | 1 refills | Status: AC

## 2024-09-02 ENCOUNTER — Ambulatory Visit: Admitting: Nurse Practitioner

## 2024-09-02 ENCOUNTER — Ambulatory Visit: Payer: Self-pay | Admitting: Nurse Practitioner

## 2024-10-01 ENCOUNTER — Ambulatory Visit

## 2024-10-08 ENCOUNTER — Ambulatory Visit: Payer: Self-pay

## 2025-02-14 ENCOUNTER — Ambulatory Visit: Payer: Self-pay | Admitting: Nurse Practitioner

## 2025-06-29 ENCOUNTER — Ambulatory Visit: Payer: Self-pay
